# Patient Record
Sex: Male | Born: 1945
Health system: Southern US, Community
[De-identification: ages and names within clinical notes are randomized; demographics above are authoritative.]

## PROBLEM LIST (undated history)

## (undated) DIAGNOSIS — E119 Type 2 diabetes mellitus without complications: Secondary | ICD-10-CM

## (undated) DIAGNOSIS — E785 Hyperlipidemia, unspecified: Secondary | ICD-10-CM

## (undated) DIAGNOSIS — I10 Essential (primary) hypertension: Secondary | ICD-10-CM

## (undated) DIAGNOSIS — F0390 Unspecified dementia without behavioral disturbance: Secondary | ICD-10-CM

## (undated) DIAGNOSIS — K219 Gastro-esophageal reflux disease without esophagitis: Secondary | ICD-10-CM

## (undated) DIAGNOSIS — I4891 Unspecified atrial fibrillation: Secondary | ICD-10-CM

## (undated) HISTORY — DX: Type 2 diabetes mellitus without complications: E11.9

## (undated) HISTORY — DX: Hyperlipidemia, unspecified: E78.5

## (undated) HISTORY — DX: Essential (primary) hypertension: I10

## (undated) HISTORY — DX: Gastro-esophageal reflux disease without esophagitis: K21.9

## (undated) HISTORY — PX: HERNIA REPAIR: SHX51

## (undated) HISTORY — PX: COLONOSCOPY: SHX174

---

## 1991-12-17 HISTORY — PX: PATELLA FRACTURE SURGERY: SHX735

## 1998-08-16 ENCOUNTER — Encounter: Admission: RE | Admit: 1998-08-16 | Discharge: 1998-08-16 | Payer: Self-pay | Admitting: *Deleted

## 2006-04-18 ENCOUNTER — Ambulatory Visit: Payer: Self-pay | Admitting: Family Medicine

## 2006-04-25 ENCOUNTER — Ambulatory Visit: Payer: Self-pay | Admitting: Family Medicine

## 2007-05-01 ENCOUNTER — Ambulatory Visit: Payer: Self-pay | Admitting: Family Medicine

## 2007-05-01 LAB — CONVERTED CEMR LAB
AST: 17 units/L (ref 0–37)
Basophils Relative: 0.1 % (ref 0.0–1.0)
Bilirubin, Direct: 0.1 mg/dL (ref 0.0–0.3)
CO2: 28 meq/L (ref 19–32)
Eosinophils Absolute: 0.2 10*3/uL (ref 0.0–0.6)
Eosinophils Relative: 4.3 % (ref 0.0–5.0)
GFR calc Af Amer: 111 mL/min
GFR calc non Af Amer: 92 mL/min
Glucose, Bld: 105 mg/dL — ABNORMAL HIGH (ref 70–99)
HCT: 42.6 % (ref 39.0–52.0)
Hemoglobin: 14.7 g/dL (ref 13.0–17.0)
Lymphocytes Relative: 33.6 % (ref 12.0–46.0)
MCV: 81.8 fL (ref 78.0–100.0)
Neutro Abs: 1.9 10*3/uL (ref 1.4–7.7)
Neutrophils Relative %: 52.3 % (ref 43.0–77.0)
PSA: 0.56 ng/mL (ref 0.10–4.00)
Sodium: 140 meq/L (ref 135–145)
Total Protein: 6.6 g/dL (ref 6.0–8.3)
WBC: 3.8 10*3/uL — ABNORMAL LOW (ref 4.5–10.5)

## 2007-05-08 ENCOUNTER — Ambulatory Visit: Payer: Self-pay | Admitting: Family Medicine

## 2007-07-29 ENCOUNTER — Ambulatory Visit: Payer: Self-pay | Admitting: Gastroenterology

## 2007-08-12 ENCOUNTER — Encounter: Payer: Self-pay | Admitting: Family Medicine

## 2007-08-12 ENCOUNTER — Ambulatory Visit: Payer: Self-pay | Admitting: Gastroenterology

## 2008-04-08 ENCOUNTER — Ambulatory Visit: Payer: Self-pay | Admitting: Family Medicine

## 2008-04-08 LAB — CONVERTED CEMR LAB
ALT: 16 units/L (ref 0–53)
Albumin: 3.9 g/dL (ref 3.5–5.2)
BUN: 8 mg/dL (ref 6–23)
Basophils Relative: 0.1 % (ref 0.0–1.0)
CO2: 28 meq/L (ref 19–32)
Calcium: 9.3 mg/dL (ref 8.4–10.5)
Cholesterol: 143 mg/dL (ref 0–200)
Creatinine, Ser: 1 mg/dL (ref 0.4–1.5)
GFR calc Af Amer: 98 mL/min
Glucose, Bld: 119 mg/dL — ABNORMAL HIGH (ref 70–99)
HCT: 44.4 % (ref 39.0–52.0)
Hemoglobin: 14.8 g/dL (ref 13.0–17.0)
Lymphocytes Relative: 38.7 % (ref 12.0–46.0)
Monocytes Absolute: 0.3 10*3/uL (ref 0.1–1.0)
Monocytes Relative: 8.6 % (ref 3.0–12.0)
Neutro Abs: 1.8 10*3/uL (ref 1.4–7.7)
PSA: 0.76 ng/mL (ref 0.10–4.00)
RBC: 5.25 M/uL (ref 4.22–5.81)
RDW: 12.6 % (ref 11.5–14.6)
Sodium: 142 meq/L (ref 135–145)
TSH: 0.77 microintl units/mL (ref 0.35–5.50)
Total CHOL/HDL Ratio: 4.4
Total Protein: 6.5 g/dL (ref 6.0–8.3)
Triglycerides: 82 mg/dL (ref 0–149)

## 2008-04-15 ENCOUNTER — Ambulatory Visit: Payer: Self-pay | Admitting: Family Medicine

## 2008-04-15 DIAGNOSIS — E785 Hyperlipidemia, unspecified: Secondary | ICD-10-CM | POA: Insufficient documentation

## 2008-04-15 DIAGNOSIS — K219 Gastro-esophageal reflux disease without esophagitis: Secondary | ICD-10-CM | POA: Insufficient documentation

## 2008-04-15 DIAGNOSIS — H409 Unspecified glaucoma: Secondary | ICD-10-CM | POA: Insufficient documentation

## 2008-05-30 ENCOUNTER — Ambulatory Visit: Payer: Self-pay | Admitting: Family Medicine

## 2008-05-30 DIAGNOSIS — I872 Venous insufficiency (chronic) (peripheral): Secondary | ICD-10-CM | POA: Insufficient documentation

## 2009-02-06 ENCOUNTER — Ambulatory Visit: Payer: Self-pay | Admitting: Family Medicine

## 2009-02-06 DIAGNOSIS — R350 Frequency of micturition: Secondary | ICD-10-CM | POA: Insufficient documentation

## 2009-02-06 LAB — CONVERTED CEMR LAB
ALT: 30 units/L (ref 0–53)
AST: 23 units/L (ref 0–37)
Albumin: 4.5 g/dL (ref 3.5–5.2)
Alkaline Phosphatase: 118 units/L — ABNORMAL HIGH (ref 39–117)
BUN: 17 mg/dL (ref 6–23)
Basophils Absolute: 0 10*3/uL (ref 0.0–0.1)
Basophils Relative: 0 % (ref 0.0–3.0)
Blood in Urine, dipstick: NEGATIVE
CO2: 30 meq/L (ref 19–32)
Eosinophils Absolute: 0.2 10*3/uL (ref 0.0–0.7)
GFR calc Af Amer: 72 mL/min
Glucose, Bld: 520 mg/dL (ref 70–99)
Lymphocytes Relative: 30.9 % (ref 12.0–46.0)
MCHC: 34.6 g/dL (ref 30.0–36.0)
MCV: 81.6 fL (ref 78.0–100.0)
Microalb Creat Ratio: 35.5 mg/g — ABNORMAL HIGH (ref 0.0–30.0)
Neutro Abs: 4.3 10*3/uL (ref 1.4–7.7)
Neutrophils Relative %: 58.5 % (ref 43.0–77.0)
Nitrite: NEGATIVE
Potassium: 4.9 meq/L (ref 3.5–5.1)
RBC: 6.08 M/uL — ABNORMAL HIGH (ref 4.22–5.81)
RDW: 11.7 % (ref 11.5–14.6)
Sodium: 135 meq/L (ref 135–145)
Specific Gravity, Urine: 1.01
Total Protein: 7.8 g/dL (ref 6.0–8.3)
WBC Urine, dipstick: NEGATIVE
pH: 5

## 2009-02-09 ENCOUNTER — Ambulatory Visit: Payer: Self-pay | Admitting: Family Medicine

## 2009-02-14 ENCOUNTER — Telehealth: Payer: Self-pay | Admitting: Family Medicine

## 2009-02-15 ENCOUNTER — Encounter: Admission: RE | Admit: 2009-02-15 | Discharge: 2009-03-28 | Payer: Self-pay | Admitting: Family Medicine

## 2009-03-02 ENCOUNTER — Ambulatory Visit: Payer: Self-pay | Admitting: Family Medicine

## 2009-05-02 ENCOUNTER — Ambulatory Visit: Payer: Self-pay | Admitting: Family Medicine

## 2009-05-02 LAB — CONVERTED CEMR LAB
CO2: 27 meq/L (ref 19–32)
Chloride: 111 meq/L (ref 96–112)
Glucose, Bld: 88 mg/dL (ref 70–99)
Potassium: 3.8 meq/L (ref 3.5–5.1)
Sodium: 142 meq/L (ref 135–145)

## 2009-05-09 ENCOUNTER — Ambulatory Visit: Payer: Self-pay | Admitting: Family Medicine

## 2009-05-26 ENCOUNTER — Telehealth: Payer: Self-pay | Admitting: *Deleted

## 2009-07-28 ENCOUNTER — Ambulatory Visit: Payer: Self-pay | Admitting: Family Medicine

## 2009-07-28 LAB — CONVERTED CEMR LAB
BUN: 17 mg/dL (ref 6–23)
CO2: 27 meq/L (ref 19–32)
Chloride: 108 meq/L (ref 96–112)
Glucose, Bld: 94 mg/dL (ref 70–99)
Potassium: 4.6 meq/L (ref 3.5–5.1)
Sodium: 141 meq/L (ref 135–145)

## 2009-08-11 ENCOUNTER — Ambulatory Visit: Payer: Self-pay | Admitting: Family Medicine

## 2009-09-06 ENCOUNTER — Ambulatory Visit: Payer: Self-pay | Admitting: Family Medicine

## 2009-09-06 LAB — CONVERTED CEMR LAB
ALT: 20 units/L (ref 0–53)
Albumin: 4 g/dL (ref 3.5–5.2)
Basophils Absolute: 0 10*3/uL (ref 0.0–0.1)
Eosinophils Relative: 6.6 % — ABNORMAL HIGH (ref 0.0–5.0)
HCT: 39.4 % (ref 39.0–52.0)
HDL: 35.8 mg/dL — ABNORMAL LOW (ref >39.00)
Hemoglobin: 13.5 g/dL (ref 13.0–17.0)
Ketones, ur: NEGATIVE mg/dL
Leukocytes, UA: NEGATIVE
Lymphocytes Relative: 35.8 % (ref 12.0–46.0)
Lymphs Abs: 1.5 10*3/uL (ref 0.7–4.0)
Monocytes Relative: 10.6 % (ref 3.0–12.0)
Nitrite: NEGATIVE
PSA: 0.51 ng/mL (ref 0.10–4.00)
Platelets: 222 10*3/uL (ref 150.0–400.0)
RDW: 12.6 % (ref 11.5–14.6)
Specific Gravity, Urine: 1.01 (ref 1.000–1.030)
TSH: 0.69 microintl units/mL (ref 0.35–5.50)
Total Bilirubin: 1.2 mg/dL (ref 0.3–1.2)
Total CHOL/HDL Ratio: 4
Triglycerides: 60 mg/dL (ref 0.0–149.0)
Urobilinogen, UA: 0.2 (ref 0.0–1.0)
VLDL: 12 mg/dL (ref 0.0–40.0)
WBC: 4.1 10*3/uL — ABNORMAL LOW (ref 4.5–10.5)
pH: 6 (ref 5.0–8.0)

## 2009-09-13 ENCOUNTER — Ambulatory Visit: Payer: Self-pay | Admitting: Family Medicine

## 2010-01-02 ENCOUNTER — Telehealth: Payer: Self-pay | Admitting: Family Medicine

## 2010-01-04 ENCOUNTER — Telehealth: Payer: Self-pay | Admitting: Family Medicine

## 2010-03-02 ENCOUNTER — Ambulatory Visit: Payer: Self-pay | Admitting: Family Medicine

## 2010-03-02 LAB — CONVERTED CEMR LAB
Calcium: 9.4 mg/dL (ref 8.4–10.5)
Creatinine, Ser: 1.1 mg/dL (ref 0.4–1.5)
GFR calc non Af Amer: 87.04 mL/min (ref >60)
Hgb A1c MFr Bld: 6.1 % (ref 4.6–6.5)
Sodium: 136 meq/L (ref 135–145)

## 2010-03-16 ENCOUNTER — Ambulatory Visit: Payer: Self-pay | Admitting: Family Medicine

## 2010-03-20 ENCOUNTER — Telehealth: Payer: Self-pay | Admitting: Family Medicine

## 2010-04-02 ENCOUNTER — Encounter: Payer: Self-pay | Admitting: Family Medicine

## 2011-01-01 ENCOUNTER — Ambulatory Visit
Admission: RE | Admit: 2011-01-01 | Discharge: 2011-01-01 | Payer: Self-pay | Source: Home / Self Care | Attending: Family Medicine | Admitting: Family Medicine

## 2011-01-15 NOTE — Miscellaneous (Signed)
Summary: eye exam   Clinical Lists Changes  Observations: Added new observation of EYES COMMENT: 03/2011 (04/02/2010 14:57) Added new observation of EYE EXAM BY: dr Hyacinth Meeker (03/29/2010 14:58) Added new observation of DMEYEEXMRES: normal (03/29/2010 14:58) Added new observation of DIAB EYE EX: normal (03/29/2010 14:58)        Diabetes Management History:      He says that he is exercising.    Diabetes Management Exam:    Eye Exam:       Eye Exam done elsewhere          Date: 03/29/2010          Results: normal          Done by: dr Hyacinth Meeker

## 2011-01-15 NOTE — Assessment & Plan Note (Signed)
Summary: 6 month roa//lh/PT RESCD FROM BUMP//CCM   Vital Signs:  Patient profile:   65 year old male Height:      74.75 inches Weight:      194 pounds BMI:     24.50 Temp:     98.3 degrees F oral BP sitting:   102 / 70  (left arm) Cuff size:   regular  Vitals Entered By: Kern Reap CMA Duncan Dull) (March 16, 2010 9:09 AM)  CC: follow-up visit Is Patient Diabetic? Yes Did you bring your meter with you today? No   CC:  follow-up visit.  History of Present Illness: Christopher [name] is a 65 year old male, married, nonsmoker, who comes in today for evaluation of diabetes.  His fasting blood sugars 118 hemoglobin A1c6 .1%.  No hypoglycemia.  Allergies: No Known Drug Allergies  Past History:  Past medical, surgical, family and social histories (including risk factors) reviewed for relevance to current acute and chronic problems.  Past Medical History: Reviewed history from 02/06/2009 and no changes required. GERD Hyperlipidemia glaucoma Diabetes mellitus, type II  Family History: Reviewed history from 04/15/2008 and no changes required. father died at 31 from alcoholism  mother died 2 from TBone brother died of a drug induced renal disease and underlying diabetes  one sister diabetic  Social History: Reviewed history from 04/15/2008 and no changes required. Occupation: Married Never Smoked Alcohol use-no Drug use-no Regular exercise-yes  Review of Systems      See HPI  Physical Exam  General:  Well-developed,well-nourished,in no acute distress; alert,appropriate and cooperative throughout examination   Impression & Recommendations:  Problem # 1:  DIABETES MELLITUS, TYPE II (ICD-250.00) Assessment Improved  His updated medication list for this problem includes:    Adult Aspirin Ec Low Strength 81 Mg Tbec (Aspirin)    Glucophage 500 Mg Tabs (Metformin hcl) .Marland Kitchen... Take 1 tablet by mouth two times a day    Zestril 2.5 Mg Tabs (Lisinopril) .Marland Kitchen... Take 1 tablet by  mouth every morning  Complete Medication List: 1)  Zocor 40 Mg Tabs (Simvastatin) .Marland Kitchen.. 1 & 1/2 at bedtime 2)  Cosopt 2-0.5 % Soln (Dorzolamide-timolol) 3)  Adult Aspirin Ec Low Strength 81 Mg Tbec (Aspirin) 4)  Hydrochlorothiazide 25 Mg Tabs (Hydrochlorothiazide) .... Take 1 tablet by mouth every morning 5)  Glucophage 500 Mg Tabs (Metformin hcl) .... Take 1 tablet by mouth two times a day 6)  Accu-chek Aviva Strp (Glucose blood) .... Test daily as directed by your doctor 7)  Accu-chek Soft Touch Lancets Misc (Lancets) .... Test daily as directed by your doctor 8)  Centrum Silver Ultra Mens Tabs (Multiple vitamins-minerals) .... Once daily 9)  Fish Oil 300 Mg Caps (Omega-3 fatty acids) .... Once daily 10)  Flax Oil (Flaxseed (linseed)) .... Once daily 11)  Potassium 99 Mg Tabs (Potassium) .... Once daily 12)  Zestril 2.5 Mg Tabs (Lisinopril) .... Take 1 tablet by mouth every morning 13)  Metanx 3-35-2 Mg Tabs (L-methylfolate-b6-b12) .... Take one tab two times a day  Patient Instructions: 1)  See your eye doctor yearly to check for diabetic eye damage. 2)  check a fasting blood sugar once weekly in the morning.  Continue current medications.  Return for your annual physical exam the fourth week in September.......last friday 3)  BMP prior to visit, ICD-9:..........................250.00. 4)  Hepatic Panel prior to visit, ICD-9: 5)  Lipid Panel prior to visit, ICD-9: 6)  TSH prior to visit, ICD-9: 7)  CBC w/ Diff prior to visit, ICD-9: 8)  Urine-dip prior to visit, ICD-9: 9)  PSA prior to visit, ICD-9: 10)  HbgA1C prior to visit, ICD-9: 11)  Urine Microalbumin prior to visit, ICD-9:

## 2011-01-15 NOTE — Progress Notes (Signed)
Summary: refills  Phone Note Refill Request Message from:  Fax from Pharmacy  Refills Requested: Medication #1:  ACCU-CHEK SOFT TOUCH LANCETS  MISC test daily as directed by your doctor   Brand Name Necessary? No  Medication #2:  ACCU-CHEK AVIVA  STRP test daily as directed by your doctor [facility] pharmacy fax---[phone]  Initial call taken by: Warnell Forester,  January 02, 2010 4:37 PM    Prescriptions: ACCU-CHEK SOFT TOUCH LANCETS  MISC (LANCETS) test daily as directed by your doctor  #100 x 11   Entered by:   Kern Reap CMA (AAMA)   Authorized by:   Roderick Pee MD   Signed by:   Kern Reap CMA (AAMA) on 01/02/2010   Method used:   Electronically to        [facility]* (mail-order)             ,          Ph: [phone]       Fax: [phone]   RxID:   [id number] ACCU-CHEK AVIVA  STRP (GLUCOSE BLOOD) test daily as directed by your doctor  #100 x 11   Entered by:   Kern Reap CMA (AAMA)   Authorized by:   Roderick Pee MD   Signed by:   Kern Reap CMA (AAMA) on 01/02/2010   Method used:   Electronically to        [facility]* (mail-order)             ,          Ph: [phone]       Fax: [phone]   RxID:   [id number]

## 2011-01-15 NOTE — Progress Notes (Signed)
Summary: sinus congestion  Phone Note Call from Patient   Caller: Patient Call For: Roderick Pee MD Summary of Call: Pt is complaining of sinus congestion, and pressure around eyes and nostrils are clogged on left side.  Wants to know what he can take? No fever. [facility] [address] [phone] Initial call taken by: Lynann Beaver CMA,  March 20, 2010 1:10 PM  Follow-up for Phone Call        Claritin 10 mg plain q.a.m., or Zantac plain nightly.  Also, saline nasal irrigation at bedtime with warm salt water and a netti pot Follow-up by: Roderick Pee MD,  March 20, 2010 1:34 PM  Additional Follow-up for Phone Call Additional follow up Details #1::        Zantac or Zyrtec, Dr. Tawanna Cooler?? Additional Follow-up by: Lynann Beaver CMA,  March 20, 2010 1:44 PM    Additional Follow-up for Phone Call Additional follow up Details #2::    zyrtec  LMTCB Debby [facility] CMA  March 20, 2010 2:10 PM  Follow-up by: Roderick Pee MD,  March 20, 2010 2:00 PM  Additional Follow-up for Phone Call Additional follow up Details #3:: Details for Additional Follow-up Action Taken: Patient called & is aware. Additional Follow-up by: Rudy Jew, RN,  March 20, 2010 2:33 PM  New/Updated Medications: CLARITIN 10 MG TABS (LORATADINE) Use in am ZYRTEC ALLERGY 10 MG TABS (CETIRIZINE HCL) Use in pm

## 2011-01-15 NOTE — Progress Notes (Signed)
Summary: refills  Phone Note Refill Request Message from:  Fax from Pharmacy  Refills Requested: Medication #1:  GLUCOPHAGE 500 MG TABS Take 1 tablet by mouth two times a day   Brand Name Necessary? No  Medication #2:  ZOCOR 40 MG  TABS 1 & 1/2 at bedtime   Brand Name Necessary? No  Medication #3:  ZESTRIL 2.5 MG TABS Take 1 tablet by mouth every morning   Brand Name Necessary? No  Medication #4:  HYDROCHLOROTHIAZIDE 25 MG  TABS Take 1 tablet by mouth every morning [facility] pharmacy fax---[phone]  Initial call taken by: Warnell Forester,  January 02, 2010 4:36 PM    Prescriptions: ZESTRIL 2.5 MG TABS (LISINOPRIL) Take 1 tablet by mouth every morning  #100 x 3   Entered by:   Kern Reap CMA (AAMA)   Authorized by:   Roderick Pee MD   Signed by:   Kern Reap CMA (AAMA) on 01/02/2010   Method used:   Electronically to        [facility]* (mail-order)             ,          Ph: [phone]       Fax: [phone]   RxID:   [id number] GLUCOPHAGE 500 MG TABS (METFORMIN HCL) Take 1 tablet by mouth two times a day  #200 x 3   Entered by:   Kern Reap CMA (AAMA)   Authorized by:   Roderick Pee MD   Signed by:   Kern Reap CMA (AAMA) on 01/02/2010   Method used:   Electronically to        [facility]* (mail-order)             ,          Ph: [phone]       Fax: [phone]   RxID:   [id number] HYDROCHLOROTHIAZIDE 25 MG  TABS (HYDROCHLOROTHIAZIDE) Take 1 tablet by mouth every morning  #100 x 3   Entered by:   Kern Reap CMA (AAMA)   Authorized by:   Roderick Pee MD   Signed by:   Kern Reap CMA (AAMA) on 01/02/2010   Method used:   Electronically to        [facility]* (mail-order)             ,          Ph: [phone]       Fax: [phone]   RxID:   [id number] ZOCOR 40 MG  TABS (SIMVASTATIN) 1 & 1/2 at bedtime  #150 x 3   Entered by:   Kern Reap CMA (AAMA)   Authorized by:   Roderick Pee MD   Signed  by:   Kern Reap CMA (AAMA) on 01/02/2010   Method used:   Electronically to        [facility]* (mail-order)             ,          Ph: [phone]       Fax: [phone]   RxID:   [id number]

## 2011-01-15 NOTE — Progress Notes (Signed)
Summary: refils  Phone Note Refill Request Message from:  Fax from Pharmacy on January 04, 2010 3:18 PM  Refills Requested: Medication #1:  GLUCOPHAGE 500 MG TABS Take 1 tablet by mouth two times a day  Medication #2:  ZOCOR 40 MG  TABS 1 & 1/2 at bedtime  Medication #3:  ZESTRIL 2.5 MG TABS Take 1 tablet by mouth every morning  Medication #4:  HYDROCHLOROTHIAZIDE 25 MG  TABS Take 1 tablet by mouth every morning Initial call taken by: Kern Reap CMA Duncan Dull),  January 04, 2010 3:18 PM    Prescriptions: ACCU-CHEK SOFT TOUCH LANCETS  MISC (LANCETS) test daily as directed by your doctor  #100 x 11   Entered by:   Kern Reap CMA (AAMA)   Authorized by:   Roderick Pee MD   Signed by:   Kern Reap CMA (AAMA) on 01/04/2010   Method used:   Electronically to        [facility]* (mail-order)             ,          Ph: [phone]       Fax: [phone]   RxID:   [id number] ACCU-CHEK AVIVA  STRP (GLUCOSE BLOOD) test daily as directed by your doctor  #100 x 11   Entered by:   Kern Reap CMA (AAMA)   Authorized by:   Roderick Pee MD   Signed by:   Kern Reap CMA (AAMA) on 01/04/2010   Method used:   Electronically to        [facility]* (mail-order)             ,          Ph: [phone]       Fax: [phone]   RxID:   [id number] HYDROCHLOROTHIAZIDE 25 MG  TABS (HYDROCHLOROTHIAZIDE) Take 1 tablet by mouth every morning  #100 x 3   Entered by:   Kern Reap CMA (AAMA)   Authorized by:   Roderick Pee MD   Signed by:   Kern Reap CMA (AAMA) on 01/04/2010   Method used:   Electronically to        [facility]* (mail-order)             ,          Ph: [phone]       Fax: [phone]   RxID:   [id number] ZESTRIL 2.5 MG TABS (LISINOPRIL) Take 1 tablet by mouth every morning  #100 x 3   Entered by:   Kern Reap CMA (AAMA)   Authorized by:   Roderick Pee MD   Signed by:   Kern Reap CMA (AAMA) on 01/04/2010   Method used:    Electronically to        [facility]* (mail-order)             ,          Ph: [phone]       Fax: [phone]   RxID:   [id number] ZOCOR 40 MG  TABS (SIMVASTATIN) 1 & 1/2 at bedtime  #150 x 3   Entered by:   Kern Reap CMA (AAMA)   Authorized by:   Roderick Pee MD   Signed by:   Kern Reap CMA (AAMA) on 01/04/2010   Method used:   Electronically to        [facility]* (mail-order)             ,  Ph: [phone]       Fax: [phone]   RxID:   [id number] GLUCOPHAGE 500 MG TABS (METFORMIN HCL) Take 1 tablet by mouth two times a day  #200 x 3   Entered by:   Kern Reap CMA (AAMA)   Authorized by:   Roderick Pee MD   Signed by:   Kern Reap CMA (AAMA) on 01/04/2010   Method used:   Electronically to        [facility]* (mail-order)             ,          Ph: [phone]       Fax: [phone]   RxID:   [id number]

## 2011-01-17 NOTE — Assessment & Plan Note (Signed)
Summary: flun shot/njr   Nurse Visit   Allergies: No Known Drug Allergies  Orders Added: 1)  Admin 1st Vaccine [90471] 2)  Flu Vaccine 33yrs + [16109] Flu Vaccine Consent Questions     Do you have a history of severe allergic reactions to this vaccine? no    Any prior history of allergic reactions to egg and/or gelatin? no    Do you have a sensitivity to the preservative Thimersol? no    Do you have a past history of Guillan-Barre Syndrome? no    Do you currently have an acute febrile illness? no    Have you ever had a severe reaction to latex? no    Vaccine information given and explained to patient? yes    Are you currently pregnant? no    Lot Number:[id number]   Exp Date:06/15/2011   Site Given  Left Deltoid IM   .lbflu

## 2011-01-24 ENCOUNTER — Other Ambulatory Visit: Payer: Self-pay | Admitting: Family Medicine

## 2011-02-15 ENCOUNTER — Other Ambulatory Visit: Payer: 59 | Admitting: Family Medicine

## 2011-02-15 DIAGNOSIS — Z0000 Encounter for general adult medical examination without abnormal findings: Secondary | ICD-10-CM

## 2011-02-15 LAB — LIPID PANEL
Cholesterol: 150 mg/dL (ref 0–200)
HDL: 44.9 mg/dL (ref >39.00)
LDL Cholesterol: 96 mg/dL (ref 0–99)
Total CHOL/HDL Ratio: 3
Triglycerides: 47 mg/dL (ref 0.0–149.0)
VLDL: 9.4 mg/dL (ref 0.0–40.0)

## 2011-02-15 LAB — CBC WITH DIFFERENTIAL/PLATELET
Basophils Absolute: 0 10*3/uL (ref 0.0–0.1)
Eosinophils Absolute: 0.2 10*3/uL (ref 0.0–0.7)
HCT: 40.6 % (ref 39.0–52.0)
Lymphs Abs: 1.4 10*3/uL (ref 0.7–4.0)
MCHC: 34.5 g/dL (ref 30.0–36.0)
Monocytes Relative: 9.7 % (ref 3.0–12.0)
Neutro Abs: 1.7 10*3/uL (ref 1.4–7.7)
Platelets: 222 10*3/uL (ref 150.0–400.0)
RDW: 13.7 % (ref 11.5–14.6)

## 2011-02-15 LAB — POCT URINALYSIS DIPSTICK
Bilirubin, UA: NEGATIVE
Glucose, UA: NEGATIVE
Ketones, UA: NEGATIVE
Leukocytes, UA: NEGATIVE
Nitrite, UA: NEGATIVE

## 2011-02-15 LAB — HEPATIC FUNCTION PANEL
AST: 23 U/L (ref 0–37)
Albumin: 4.2 g/dL (ref 3.5–5.2)
Total Bilirubin: 1.3 mg/dL — ABNORMAL HIGH (ref 0.3–1.2)

## 2011-02-15 LAB — BASIC METABOLIC PANEL
BUN: 17 mg/dL (ref 6–23)
CO2: 27 mEq/L (ref 19–32)
Calcium: 9.8 mg/dL (ref 8.4–10.5)
GFR: 87.69 mL/min (ref >60.00)
Glucose, Bld: 115 mg/dL — ABNORMAL HIGH (ref 70–99)
Potassium: 3.9 mEq/L (ref 3.5–5.1)

## 2011-02-15 LAB — MICROALBUMIN / CREATININE URINE RATIO
Creatinine,U: 78.7 mg/dL
Microalb Creat Ratio: 2.4 mg/g (ref 0.0–30.0)
Microalb, Ur: 1.9 mg/dL (ref 0.0–1.9)

## 2011-02-15 LAB — HEMOGLOBIN A1C: Hgb A1c MFr Bld: 6.6 % — ABNORMAL HIGH (ref 4.6–6.5)

## 2011-02-15 LAB — PSA: PSA: 0.83 ng/mL (ref 0.10–4.00)

## 2011-03-01 ENCOUNTER — Encounter: Payer: Self-pay | Admitting: Family Medicine

## 2011-03-02 ENCOUNTER — Other Ambulatory Visit: Payer: Self-pay | Admitting: Family Medicine

## 2011-03-25 ENCOUNTER — Encounter: Payer: Self-pay | Admitting: Family Medicine

## 2011-03-26 ENCOUNTER — Ambulatory Visit (INDEPENDENT_AMBULATORY_CARE_PROVIDER_SITE_OTHER): Payer: Managed Care, Other (non HMO) | Admitting: Family Medicine

## 2011-03-26 ENCOUNTER — Encounter: Payer: Self-pay | Admitting: Family Medicine

## 2011-03-26 VITALS — BP 110/74 | Temp 98.5°F | Ht 75.5 in | Wt 201.0 lb

## 2011-03-26 DIAGNOSIS — E785 Hyperlipidemia, unspecified: Secondary | ICD-10-CM

## 2011-03-26 DIAGNOSIS — E119 Type 2 diabetes mellitus without complications: Secondary | ICD-10-CM

## 2011-03-26 DIAGNOSIS — I872 Venous insufficiency (chronic) (peripheral): Secondary | ICD-10-CM

## 2011-03-26 DIAGNOSIS — I10 Essential (primary) hypertension: Secondary | ICD-10-CM

## 2011-03-26 MED ORDER — SIMVASTATIN 40 MG PO TABS: ORAL_TABLET | ORAL | Status: DC

## 2011-03-26 MED ORDER — HYDROCHLOROTHIAZIDE 25 MG PO TABS: 25.0000 mg | ORAL_TABLET | Freq: Every day | ORAL | Status: DC

## 2011-03-26 MED ORDER — LISINOPRIL 2.5 MG PO TABS: 2.5000 mg | ORAL_TABLET | Freq: Every day | ORAL | Status: DC

## 2011-03-26 MED ORDER — METFORMIN HCL 500 MG PO TABS: 500.0000 mg | ORAL_TABLET | Freq: Two times a day (BID) | ORAL | Status: DC

## 2011-03-26 NOTE — Progress Notes (Signed)
  Subjective:    Patient ID: Christopher [name], male    DOB: 12/10/1946, 65 y.o.   MRN: [id number]  HPIPhillip is a 65 year old, married male, nonsmoker, who comes in today for follow up of diabetes, and hyperlipidemia, and venous insufficiency.  His medications were reviewed in detail.  No changes.  He states he feels well.  No complaints.  He gets routine eye care, dental care, colonoscopy, 2006, normal, tetanus, 2008, Pneumovax 2010, information given on shingles.    Review of Systems  Constitutional: Negative.   HENT: Negative.   Eyes: Negative.   Respiratory: Negative.   Cardiovascular: Negative.   Gastrointestinal: Negative.   Genitourinary: Negative.   Musculoskeletal: Negative.   Skin: Negative.   Neurological: Negative.   Hematological: Negative.   Psychiatric/Behavioral: Negative.        Objective:   Physical Exam  Constitutional: He is oriented to person, place, and time. He appears well-developed and well-nourished.  HENT:  Head: Normocephalic and atraumatic.  Right Ear: External ear normal.  Left Ear: External ear normal.  Nose: Nose normal.  Mouth/Throat: Oropharynx is clear and moist.  Eyes: Conjunctivae and EOM are normal. Pupils are equal, round, and reactive to light.  Neck: Normal range of motion. Neck supple. No JVD present. No tracheal deviation present. No thyromegaly present.  Cardiovascular: Normal rate, regular rhythm, normal heart sounds and intact distal pulses.  Exam reveals no gallop and no friction rub.   No murmur heard. Pulmonary/Chest: Effort normal and breath sounds normal. No stridor. No respiratory distress. He has no wheezes. He has no rales. He exhibits no tenderness.  Abdominal: Soft. Bowel sounds are normal. He exhibits no distension and no mass. There is no tenderness. There is no rebound and no guarding.  Genitourinary: Rectum normal, prostate normal and penis normal. Guaiac negative stool. No penile tenderness.  Musculoskeletal:  Normal range of motion. He exhibits no edema and no tenderness.  Lymphadenopathy:    He has no cervical adenopathy.  Neurological: He is alert and oriented to person, place, and time. He has normal reflexes. No cranial nerve deficit. He exhibits normal muscle tone.  Skin: Skin is warm and dry. No rash noted. No erythema. No pallor.  Psychiatric: He has a normal mood and affect. His behavior is normal. Judgment and thought content normal.          Assessment & Plan:  Diabetes type 2, under good control continue current medications.Follow-up in 6 months  Hyperlipidemia.  Continue Zocor 40 nightly  Venous insufficiency.  Continue hydrochlorothiazide 25 daily.  Consider shingles. Vaccination

## 2011-03-26 NOTE — Patient Instructions (Signed)
Continue current medications follow-up in one year or sooner if any problems 

## 2011-05-16 ENCOUNTER — Other Ambulatory Visit: Payer: Self-pay | Admitting: *Deleted

## 2011-06-22 ENCOUNTER — Other Ambulatory Visit: Payer: Self-pay | Admitting: Family Medicine

## 2011-09-17 ENCOUNTER — Other Ambulatory Visit (INDEPENDENT_AMBULATORY_CARE_PROVIDER_SITE_OTHER): Payer: Managed Care, Other (non HMO)

## 2011-09-17 DIAGNOSIS — E119 Type 2 diabetes mellitus without complications: Secondary | ICD-10-CM

## 2011-09-17 LAB — BASIC METABOLIC PANEL
BUN: 18 mg/dL (ref 6–23)
GFR: 83.96 mL/min (ref >60.00)
Potassium: 3.4 mEq/L — ABNORMAL LOW (ref 3.5–5.1)
Sodium: 138 mEq/L (ref 135–145)

## 2011-09-24 ENCOUNTER — Ambulatory Visit (INDEPENDENT_AMBULATORY_CARE_PROVIDER_SITE_OTHER): Payer: Managed Care, Other (non HMO) | Admitting: Family Medicine

## 2011-09-24 ENCOUNTER — Encounter: Payer: Self-pay | Admitting: Family Medicine

## 2011-09-24 VITALS — BP 110/82 | Temp 98.2°F | Wt 199.0 lb

## 2011-09-24 DIAGNOSIS — E119 Type 2 diabetes mellitus without complications: Secondary | ICD-10-CM

## 2011-09-24 NOTE — Progress Notes (Signed)
  Subjective:    Patient ID: Christopher [name], male    DOB: 1946/09/12, 65 y.o.   MRN: [id number]  HPI  Christopher [name] is a 65 year old male, nonsmoker, married, and comes in today for follow-up of diabetes.  He takes metformin 500 mg b.i.d. Blood sugars up to 148 however hemoglobin A1c is 6.8%.  He's been off his diet little bit, but he exercises on a daily basis.  Weight 199, height 6-foot 4  Review of Systems General metabolic review of systems otherwise negative    Objective:   Physical Exam Well-developed well-nourished, male in no acute distress       Assessment & Plan:  Diabetes type II adult continue current therapy.  Follow-up March 2013 annual exam

## 2011-09-24 NOTE — Patient Instructions (Signed)
Continue your current medications.  Follow-up in March for your annual exam.  Labs one week prior

## 2012-03-19 ENCOUNTER — Other Ambulatory Visit (INDEPENDENT_AMBULATORY_CARE_PROVIDER_SITE_OTHER): Payer: Managed Care, Other (non HMO)

## 2012-03-19 DIAGNOSIS — E119 Type 2 diabetes mellitus without complications: Secondary | ICD-10-CM

## 2012-03-19 LAB — MICROALBUMIN / CREATININE URINE RATIO
Creatinine,U: 83.2 mg/dL
Microalb Creat Ratio: 0.1 mg/g (ref 0.0–30.0)
Microalb, Ur: 0.1 mg/dL (ref 0.0–1.9)

## 2012-03-26 ENCOUNTER — Encounter: Payer: Self-pay | Admitting: Family Medicine

## 2012-03-26 ENCOUNTER — Ambulatory Visit (INDEPENDENT_AMBULATORY_CARE_PROVIDER_SITE_OTHER): Payer: Managed Care, Other (non HMO) | Admitting: Family Medicine

## 2012-03-26 VITALS — BP 90/60 | Temp 98.5°F | Ht 75.5 in | Wt 193.0 lb

## 2012-03-26 DIAGNOSIS — E785 Hyperlipidemia, unspecified: Secondary | ICD-10-CM

## 2012-03-26 DIAGNOSIS — K219 Gastro-esophageal reflux disease without esophagitis: Secondary | ICD-10-CM

## 2012-03-26 DIAGNOSIS — E119 Type 2 diabetes mellitus without complications: Secondary | ICD-10-CM

## 2012-03-26 DIAGNOSIS — I10 Essential (primary) hypertension: Secondary | ICD-10-CM

## 2012-03-26 DIAGNOSIS — I872 Venous insufficiency (chronic) (peripheral): Secondary | ICD-10-CM

## 2012-03-26 LAB — HEPATIC FUNCTION PANEL
ALT: 18 U/L (ref 0–53)
AST: 24 U/L (ref 0–37)
Alkaline Phosphatase: 53 U/L (ref 39–117)

## 2012-03-26 LAB — CBC WITH DIFFERENTIAL/PLATELET
Basophils Relative: 1 % (ref 0.0–3.0)
Eosinophils Absolute: 0.3 10*3/uL (ref 0.0–0.7)
Hemoglobin: 14 g/dL (ref 13.0–17.0)
Lymphocytes Relative: 40.2 % (ref 12.0–46.0)
MCHC: 33.7 g/dL (ref 30.0–36.0)
MCV: 82.6 fl (ref 78.0–100.0)
Neutro Abs: 1.7 10*3/uL (ref 1.4–7.7)
RBC: 5.04 Mil/uL (ref 4.22–5.81)

## 2012-03-26 LAB — LIPID PANEL
Cholesterol: 159 mg/dL (ref 0–200)
LDL Cholesterol: 105 mg/dL — ABNORMAL HIGH (ref 0–99)
Total CHOL/HDL Ratio: 4
Triglycerides: 74 mg/dL (ref 0.0–149.0)
VLDL: 14.8 mg/dL (ref 0.0–40.0)

## 2012-03-26 LAB — BASIC METABOLIC PANEL
CO2: 27 mEq/L (ref 19–32)
Calcium: 9.6 mg/dL (ref 8.4–10.5)
Chloride: 100 mEq/L (ref 96–112)
Potassium: 3.7 mEq/L (ref 3.5–5.1)
Sodium: 136 mEq/L (ref 135–145)

## 2012-03-26 MED ORDER — HYDROCHLOROTHIAZIDE 25 MG PO TABS: 25.0000 mg | ORAL_TABLET | Freq: Every day | ORAL | Status: DC

## 2012-03-26 MED ORDER — LISINOPRIL 2.5 MG PO TABS: 2.5000 mg | ORAL_TABLET | Freq: Every day | ORAL | Status: DC

## 2012-03-26 MED ORDER — SIMVASTATIN 40 MG PO TABS: ORAL_TABLET | ORAL | Status: DC

## 2012-03-26 MED ORDER — METFORMIN HCL 500 MG PO TABS: 500.0000 mg | ORAL_TABLET | Freq: Two times a day (BID) | ORAL | Status: DC

## 2012-03-26 MED ORDER — GLUCOSE BLOOD VI STRP: ORAL_STRIP | Status: DC

## 2012-03-26 MED ORDER — ACCU-CHEK MULTICLIX LANCETS MISC: Status: DC

## 2012-03-26 NOTE — Progress Notes (Signed)
  Subjective:    Patient ID: Christopher [name], male    DOB: 09-16-1946, 66 y.o.   MRN: [id number]  HPI Christopher [name] is a 66 year old male nonsmoker who comes in today for annual evaluation of diabetes hyperlipidemia venous insufficiency of allergic rhinitis. He also has glaucoma and has been treated by his eye doctor  His medications reviewed they're stable blood sugar at home has been normal. Labs prior to physical examination show no microalbumin  He gets routine eye care, dental care, vaccinations up-to-date except no shingles vaccine encourage that  Colonoscopy and GI   Review of Systems  Constitutional: Negative.   HENT: Negative.   Eyes: Negative.   Respiratory: Negative.   Cardiovascular: Negative.   Gastrointestinal: Negative.   Genitourinary: Negative.   Musculoskeletal: Negative.   Skin: Negative.   Neurological: Negative.   Hematological: Negative.   Psychiatric/Behavioral: Negative.        Objective:   Physical Exam  Constitutional: He is oriented to person, place, and time. He appears well-developed and well-nourished.  HENT:  Head: Normocephalic and atraumatic.  Right Ear: External ear normal.  Left Ear: External ear normal.  Nose: Nose normal.  Mouth/Throat: Oropharynx is clear and moist.  Eyes: Conjunctivae and EOM are normal. Pupils are equal, round, and reactive to light.  Neck: Normal range of motion. Neck supple. No JVD present. No tracheal deviation present. No thyromegaly present.  Cardiovascular: Normal rate, regular rhythm, normal heart sounds and intact distal pulses.  Exam reveals no gallop and no friction rub.   No murmur heard. Pulmonary/Chest: Effort normal and breath sounds normal. No stridor. No respiratory distress. He has no wheezes. He has no rales. He exhibits no tenderness.  Abdominal: Soft. Bowel sounds are normal. He exhibits no distension and no mass. There is no tenderness. There is no rebound and no guarding.  Genitourinary: Rectum normal,  prostate normal and penis normal. Guaiac negative stool. No penile tenderness.  Musculoskeletal: Normal range of motion. He exhibits no edema and no tenderness.  Lymphadenopathy:    He has no cervical adenopathy.  Neurological: He is alert and oriented to person, place, and time. He has normal reflexes. No cranial nerve deficit. He exhibits normal muscle tone.  Skin: Skin is warm and dry. No rash noted. No erythema. No pallor.  Psychiatric: He has a normal mood and affect. His behavior is normal. Judgment and thought content normal.          Assessment & Plan:  Healthy male  Diabetes type 2 check labs  Hyperlipidemia check labs continue Zocor 60 mg each bedtime along with an aspirin tablet  Venous insufficiency continue hydrochlorothiazide one daily  Glaucoma continue followup by ophthalmology.

## 2012-03-26 NOTE — Patient Instructions (Signed)
Continue your current medications  We will call you to discuss followup and any changes in medicines that we need to make

## 2012-03-27 LAB — HEMOGLOBIN A1C: Hgb A1c MFr Bld: 6.9 % — ABNORMAL HIGH (ref 4.6–6.5)

## 2013-03-04 ENCOUNTER — Other Ambulatory Visit: Payer: Managed Care, Other (non HMO)

## 2013-03-11 ENCOUNTER — Encounter: Payer: Managed Care, Other (non HMO) | Admitting: Family Medicine

## 2013-04-12 ENCOUNTER — Telehealth: Payer: Self-pay | Admitting: Family Medicine

## 2013-04-12 DIAGNOSIS — I872 Venous insufficiency (chronic) (peripheral): Secondary | ICD-10-CM

## 2013-04-12 DIAGNOSIS — E119 Type 2 diabetes mellitus without complications: Secondary | ICD-10-CM

## 2013-04-12 DIAGNOSIS — I10 Essential (primary) hypertension: Secondary | ICD-10-CM

## 2013-04-12 DIAGNOSIS — E785 Hyperlipidemia, unspecified: Secondary | ICD-10-CM

## 2013-04-12 MED ORDER — LISINOPRIL 2.5 MG PO TABS: 2.5000 mg | ORAL_TABLET | Freq: Every day | ORAL | Status: DC

## 2013-04-12 MED ORDER — HYDROCHLOROTHIAZIDE 25 MG PO TABS: 25.0000 mg | ORAL_TABLET | Freq: Every day | ORAL | Status: DC

## 2013-04-12 MED ORDER — METFORMIN HCL 500 MG PO TABS: 500.0000 mg | ORAL_TABLET | Freq: Two times a day (BID) | ORAL | Status: DC

## 2013-04-12 MED ORDER — SIMVASTATIN 40 MG PO TABS: ORAL_TABLET | ORAL | Status: DC

## 2013-04-12 NOTE — Telephone Encounter (Signed)
Patient called stating that he need refills of his simvastatin 40 mg 1 1/2 po qd at night, metformin 500 mg 1 po bid with meal, lisinopril 2.5 mg 1poqd, hydroclorothiazide 25 mg 1poqd sent to [facility] on [address]. Please assist.

## 2013-05-27 ENCOUNTER — Other Ambulatory Visit: Payer: Managed Care, Other (non HMO)

## 2013-06-01 ENCOUNTER — Encounter: Payer: Managed Care, Other (non HMO) | Admitting: Family Medicine

## 2013-06-17 ENCOUNTER — Other Ambulatory Visit (INDEPENDENT_AMBULATORY_CARE_PROVIDER_SITE_OTHER): Payer: Managed Care, Other (non HMO)

## 2013-06-17 DIAGNOSIS — Z0000 Encounter for general adult medical examination without abnormal findings: Secondary | ICD-10-CM

## 2013-06-17 LAB — BASIC METABOLIC PANEL
BUN: 22 mg/dL (ref 6–23)
CO2: 25 mEq/L (ref 19–32)
Chloride: 96 mEq/L (ref 96–112)
Creatinine, Ser: 1.1 mg/dL (ref 0.4–1.5)

## 2013-06-17 LAB — HEPATIC FUNCTION PANEL
Bilirubin, Direct: 0.1 mg/dL (ref 0.0–0.3)
Total Protein: 6.6 g/dL (ref 6.0–8.3)

## 2013-06-17 LAB — CBC WITH DIFFERENTIAL/PLATELET
Basophils Relative: 0.7 % (ref 0.0–3.0)
Eosinophils Absolute: 0.1 10*3/uL (ref 0.0–0.7)
HCT: 41.1 % (ref 39.0–52.0)
Lymphs Abs: 1.5 10*3/uL (ref 0.7–4.0)
MCHC: 33 g/dL (ref 30.0–36.0)
MCV: 82.2 fl (ref 78.0–100.0)
Monocytes Absolute: 0.4 10*3/uL (ref 0.1–1.0)
Neutrophils Relative %: 56.6 % (ref 43.0–77.0)
Platelets: 234 10*3/uL (ref 150.0–400.0)
RBC: 5.01 Mil/uL (ref 4.22–5.81)

## 2013-06-17 LAB — POCT URINALYSIS DIPSTICK
Bilirubin, UA: NEGATIVE
Spec Grav, UA: 1.01
Urobilinogen, UA: 0.2
pH, UA: 5

## 2013-06-17 LAB — HEMOGLOBIN A1C: Hgb A1c MFr Bld: 16.1 % — ABNORMAL HIGH (ref 4.6–6.5)

## 2013-06-17 LAB — MICROALBUMIN / CREATININE URINE RATIO
Creatinine,U: 60.6 mg/dL
Microalb Creat Ratio: 1.2 mg/g (ref 0.0–30.0)

## 2013-06-17 LAB — LIPID PANEL
HDL: 50.1 mg/dL (ref >39.00)
Triglycerides: 94 mg/dL (ref 0.0–149.0)

## 2013-06-17 LAB — TSH: TSH: 0.58 u[IU]/mL (ref 0.35–5.50)

## 2013-06-21 ENCOUNTER — Encounter: Payer: Self-pay | Admitting: Family Medicine

## 2013-06-21 ENCOUNTER — Ambulatory Visit (INDEPENDENT_AMBULATORY_CARE_PROVIDER_SITE_OTHER): Payer: Managed Care, Other (non HMO) | Admitting: Family Medicine

## 2013-06-21 VITALS — BP 100/60 | Temp 98.3°F | Wt 172.0 lb

## 2013-06-21 DIAGNOSIS — E108 Type 1 diabetes mellitus with unspecified complications: Secondary | ICD-10-CM

## 2013-06-21 DIAGNOSIS — E1065 Type 1 diabetes mellitus with hyperglycemia: Secondary | ICD-10-CM

## 2013-06-21 NOTE — Patient Instructions (Signed)
Drink lots of water  No sugar  I gave you a dose of insulin 20 units at 3:50 PM,,,,,,,,,,,,,,,,, take 20 units at 10 PM tonight and return at 8:15 in the morning for followup  Check your blood sugar at dinnertime and before you give yourself your evening dose and in the morning before you come  Have your wife come back with you

## 2013-06-21 NOTE — Progress Notes (Signed)
  Subjective:    Patient ID: [name], male    DOB: 08/21/1946, 67 y.o.   MRN: [id number]  HPI Christopher [name] is a 67 year old male married nonsmoker who comes in today because I asked him to.  He had pre-physical labs 4 days ago A1c was 16 with a blood sugar 492. Previous blood sugar a year ago was 174 with an A1c of 6.9%.  He states he's noticed blurred vision and a 15 pound weight loss over the last 3 months increased thirst increased urination and fatigue.he ishe is taking his metformin 500 mg twice a day   Review of Systems Review of systems otherwise negative    Objective:   Physical Exam   well-developed well-nourished thin male weight 172 no acute distress BP 100/60  We had a long discussion about diabetes treatment options including the induction of insulin. At this point he needs insulin. I gave him his first shot of 7525 at 3:50 PM in the afternoon. I will have him monitor his blood sugar carefully and check them every 24 hours.      Assessment & Plan:  Diabetes type 1 begin insulin monitor blood sugar followup tomorrow with wife

## 2013-06-22 ENCOUNTER — Ambulatory Visit (INDEPENDENT_AMBULATORY_CARE_PROVIDER_SITE_OTHER): Payer: Managed Care, Other (non HMO) | Admitting: Family Medicine

## 2013-06-22 ENCOUNTER — Encounter: Payer: Self-pay | Admitting: Family Medicine

## 2013-06-22 VITALS — BP 110/80 | Temp 97.9°F | Wt 175.0 lb

## 2013-06-22 DIAGNOSIS — E1065 Type 1 diabetes mellitus with hyperglycemia: Secondary | ICD-10-CM

## 2013-06-22 MED ORDER — INSULIN PEN NEEDLE 32G X 4 MM MISC: 1.0000 | Freq: Every day | Status: DC

## 2013-06-22 MED ORDER — INSULIN LISPRO PROT & LISPRO (75-25 MIX) 100 UNIT/ML ~~LOC~~ SUSP: 10.0000 [IU] | Freq: Every day | SUBCUTANEOUS | Status: DC

## 2013-06-22 NOTE — Progress Notes (Signed)
  Subjective:    Patient ID: Christopher [name], male    DOB: 12/27/1945, 67 y.o.   MRN: [id number]  HPI Christopher [name] is a 67 year old male who comes today accompanied by his wife for evaluation of new-onset diabetes type 1  We saw him yesterday with a blood sugar over 400 in an A1c of 16%. We gave him 20 units of 7525 insulin here in the office and he took another 20 units at bedtime last night. This morning's blood sugar was 181.  We spent a good deal of time talking about type 1 diabetes treatment options etc. Etc.   Review of Systems    review of systems negative except the persistent blurred vision and nocturia which will abate over time Objective:   Physical Exam Well-developed well-nourished male in no acute distress weight 175 BP 110/80       Assessment & Plan:  New-onset diabetes type 1 start basic insulin 10 units each bedtime followup Thursday

## 2013-06-22 NOTE — Patient Instructions (Addendum)
Take 10 units of insulin at bedtime  Check a fasting blood sugar once a day in the morning  If at any time during the day you feel weak shaky or nervous check a blood sugar,,,,,,,,, if your blood sugars less than 80,,,,,,, have a snack  Drink lots of water  Normal activities  Followup on Thursday

## 2013-06-23 ENCOUNTER — Telehealth: Payer: Self-pay | Admitting: Family Medicine

## 2013-06-23 NOTE — Telephone Encounter (Signed)
Patient took 10 units of insuline last night and this morning his glucose was at 315.  Patient feels "okay".  Please advise

## 2013-06-23 NOTE — Telephone Encounter (Signed)
Christopher [name] let's increase his evening dose of insulin to 20 units. He'll return tomorrow for followup

## 2013-06-23 NOTE — Telephone Encounter (Signed)
Pt was told to call Fleet Contras this am if symptoms were back. Pt would like a call back ASAP.

## 2013-06-23 NOTE — Telephone Encounter (Signed)
Spoke with patient.

## 2013-06-24 ENCOUNTER — Ambulatory Visit (INDEPENDENT_AMBULATORY_CARE_PROVIDER_SITE_OTHER): Payer: Managed Care, Other (non HMO) | Admitting: Family Medicine

## 2013-06-24 ENCOUNTER — Encounter: Payer: Self-pay | Admitting: Family Medicine

## 2013-06-24 VITALS — BP 104/68 | Temp 97.7°F | Wt 177.0 lb

## 2013-06-24 DIAGNOSIS — E1065 Type 1 diabetes mellitus with hyperglycemia: Secondary | ICD-10-CM

## 2013-06-24 DIAGNOSIS — E108 Type 1 diabetes mellitus with unspecified complications: Secondary | ICD-10-CM

## 2013-06-24 DIAGNOSIS — R350 Frequency of micturition: Secondary | ICD-10-CM

## 2013-06-24 NOTE — Patient Instructions (Signed)
Increase your nightly insulin to 30 units tonight,,,,,,,,, Friday,,,,,, Saturday,,,,, and Sunday  If by Monday morning your fasting blood sugar is around 120 continue that 30 units at bedtime  If your fasting blood sugar is not 120 increase your dose to 40 units starting Monday night  Followup on Thursday of next week

## 2013-06-24 NOTE — Progress Notes (Signed)
  Subjective:    Patient ID: Christopher [name], male    DOB: 1946/07/12, 67 y.o.   MRN: [id number]  HPI Christopher [name] is a 67 year old married male nonsmoker who comes in today for followup of new-onset diabetes type 1  We've been working with him as an outpatient over the last week. His blood sugar was over 400. A1c had shot up to 16%. He's on 20 units of insulin nightly and his blood sugar now is down to 288.   Review of Systems Review of systems negative no hypoglycemia    Objective:   Physical Exam  Well-developed well-nourished male no acute distress BP 104/68 weight stable 177      Assessment & Plan:  Diabetes type 1 increase insulin followup in one week

## 2013-06-29 ENCOUNTER — Encounter: Payer: Managed Care, Other (non HMO) | Admitting: Family Medicine

## 2013-07-01 ENCOUNTER — Ambulatory Visit (INDEPENDENT_AMBULATORY_CARE_PROVIDER_SITE_OTHER): Payer: Managed Care, Other (non HMO) | Admitting: Family Medicine

## 2013-07-01 ENCOUNTER — Encounter: Payer: Self-pay | Admitting: Family Medicine

## 2013-07-01 VITALS — BP 118/80 | Temp 98.6°F | Ht 76.0 in | Wt 191.0 lb

## 2013-07-01 DIAGNOSIS — I10 Essential (primary) hypertension: Secondary | ICD-10-CM

## 2013-07-01 DIAGNOSIS — I872 Venous insufficiency (chronic) (peripheral): Secondary | ICD-10-CM

## 2013-07-01 DIAGNOSIS — E108 Type 1 diabetes mellitus with unspecified complications: Secondary | ICD-10-CM

## 2013-07-01 DIAGNOSIS — E1065 Type 1 diabetes mellitus with hyperglycemia: Secondary | ICD-10-CM

## 2013-07-01 DIAGNOSIS — E785 Hyperlipidemia, unspecified: Secondary | ICD-10-CM

## 2013-07-01 DIAGNOSIS — E119 Type 2 diabetes mellitus without complications: Secondary | ICD-10-CM

## 2013-07-01 MED ORDER — HYDROCHLOROTHIAZIDE 25 MG PO TABS: 25.0000 mg | ORAL_TABLET | Freq: Every day | ORAL | Status: DC

## 2013-07-01 MED ORDER — LISINOPRIL 2.5 MG PO TABS: 2.5000 mg | ORAL_TABLET | Freq: Every day | ORAL | Status: DC

## 2013-07-01 MED ORDER — METFORMIN HCL 500 MG PO TABS: 500.0000 mg | ORAL_TABLET | Freq: Two times a day (BID) | ORAL | Status: DC

## 2013-07-01 MED ORDER — SIMVASTATIN 40 MG PO TABS: ORAL_TABLET | ORAL | Status: DC

## 2013-07-01 NOTE — Patient Instructions (Addendum)
Takes 30 units of insulin at bedtime however if you go to gym and workout take only 25 units at night. He fasting blood sugar in the morning should be between the 101 30  If your fasting blood sugar in the morning as consistently below 100 decrease your evening insulin by 5 units  Continue to check a fasting blood sugar daily in the morning and return to see me in 6 weeks for followup  Continue your other medications

## 2013-07-01 NOTE — Progress Notes (Signed)
  Subjective:    Patient ID: Christopher [name], male    DOB: [birth year], 67 y.o.   MRN: [id number]  HPI Christopher [name] is a 67 year old married male nonsmoker who comes in today for general physical examination  We had done his primary labs a week ago and unfortunately his blood sugar went up to 492 his hemoglobin A1c will which had been 6.9 range which is 16. We therefore brought him in immediately start him on insulin and everted a hospital admission. His fasting blood sugar this morning was 103.  He states overall he feels much better his weight is stabilized he's not up all night urinating.  The rest of his med list reviewed there've been no changes  Vaccinations up-to-date  His cognitive function is normal he exercises on a regular basis weight is 191 pounds a 6 foot 4, home health safety reviewed no issues identified, no guns in the house, he does have a health care power of attorney and living well Review of Systems  Constitutional: Negative.   HENT: Negative.   Eyes: Negative.   Respiratory: Negative.   Cardiovascular: Negative.   Gastrointestinal: Negative.   Genitourinary: Negative.   Musculoskeletal: Negative.   Skin: Negative.   Neurological: Negative.   Psychiatric/Behavioral: Negative.        Objective:   Physical Exam  Constitutional: He is oriented to person, place, and time. He appears well-developed and well-nourished.  HENT:  Head: Normocephalic and atraumatic.  Right Ear: External ear normal.  Left Ear: External ear normal.  Nose: Nose normal.  Mouth/Throat: Oropharynx is clear and moist.  Eyes: Conjunctivae and EOM are normal. Pupils are equal, round, and reactive to light.  Neck: Normal range of motion. Neck supple. No JVD present. No tracheal deviation present. No thyromegaly present.  Cardiovascular: Normal rate, regular rhythm, normal heart sounds and intact distal pulses.  Exam reveals no gallop and no friction rub.   No murmur heard. Pulmonary/Chest: Effort  normal and breath sounds normal. No stridor. No respiratory distress. He has no wheezes. He has no rales. He exhibits no tenderness.  Abdominal: Soft. Bowel sounds are normal. He exhibits no distension and no mass. There is no tenderness. There is no rebound and no guarding.  Genitourinary: Rectum normal, prostate normal and penis normal. Guaiac negative stool. No penile tenderness.  No ED  Musculoskeletal: Normal range of motion. He exhibits no edema and no tenderness.  Lymphadenopathy:    He has no cervical adenopathy.  Neurological: He is alert and oriented to person, place, and time. He has normal reflexes. No cranial nerve deficit. He exhibits normal muscle tone.  Skin: Skin is warm and dry. No rash noted. No erythema. No pallor.  Psychiatric: He has a normal mood and affect. His behavior is normal. Judgment and thought content normal.          Assessment & Plan:  Healthy male  New onset of diabetes type 1 continue 30 units of insulin nightly at bedtime metformin 500 mg twice a day  History of glaucoma followed by ophthalmology  Hyperlipidemia continue Zocor 60 mg daily  Allergic rhinitis continue OTC plain Zyrtec  Hypertension well controlled on lisinopril 2.5 mg and hydrochlorothiazide 25 mg BP 118/80

## 2013-07-20 ENCOUNTER — Encounter: Payer: Managed Care, Other (non HMO) | Attending: Family Medicine | Admitting: *Deleted

## 2013-07-20 ENCOUNTER — Encounter: Payer: Self-pay | Admitting: *Deleted

## 2013-07-20 ENCOUNTER — Telehealth: Payer: Self-pay | Admitting: Family Medicine

## 2013-07-20 VITALS — Ht 74.5 in | Wt 192.1 lb

## 2013-07-20 DIAGNOSIS — E1065 Type 1 diabetes mellitus with hyperglycemia: Secondary | ICD-10-CM

## 2013-07-20 DIAGNOSIS — Z713 Dietary counseling and surveillance: Secondary | ICD-10-CM | POA: Insufficient documentation

## 2013-07-20 DIAGNOSIS — E119 Type 2 diabetes mellitus without complications: Secondary | ICD-10-CM | POA: Insufficient documentation

## 2013-07-20 NOTE — Telephone Encounter (Signed)
Pt states he was unable to get any of his RX filled by pharm. They only gave a few of everything. Even though it looks like we sent in script w/ refills on 7/17, they did not fill these RX. Pls advise  Pt needs : metFORMIN (GLUCOPHAGE) 500 MG tablet 2/x day simvastatin (ZOCOR) 40 MG tablet 1.5/day lisinopril (PRINIVIL,ZESTRIL) 2.5 MG tablet  1 / day hydrochlorothiazide (HYDRODIURIL) 25 MG tablet Pharm: [facility]/ [address]  Pt has resceduled his appt w/ dr Tawanna Cooler for 10/2

## 2013-07-20 NOTE — Telephone Encounter (Signed)
Spoke with pharmacy and patient has refills "on hold".  Spoke with patient.

## 2013-07-20 NOTE — Progress Notes (Signed)
Appt start time: 1530 end time:  1630.  Assessment:  Patient was seen on  07/20/2013 for individual diabetes education. Lives with wife and 66 yo grand daughter. Share grocery shopping and both prepare meals. Retiring tomorrow from [employer]. Walks on treadmill for about 35 minutes 6 days a week. SMBG every AM, average reported range is 75-150 mg/dl. Taking Humalog 75/25 @ 30 units every evening before bed. He expresses interest in attending our Diabetes Core Classes.  Current HbA1c: 16.1% on 06/17/2013  MEDICATIONS: see list   DIETARY INTAKE:  Usual eating pattern includes 3 meals and 0 snacks per day.  Everyday foods include good variety of all food groups.  Avoided foods include coffee, regular sodas, asparagus, has been avoiding  white potatoes.    24-hr recall:  B ( AM): granola, at least 1 cup yogurt and nuts, occasionally hot tea  Snk ( AM): no  L ( PM): bring soup and salad or chicken sandwich, water Snk ( PM): no D ( PM): meat, brown starch and vegetable type meal, water or diet soda Snk ( PM): no Beverages: hot tea, water, diet soda  Usual physical activity: treadmill daily  Estimated energy needs: 1800 calories 200 g carbohydrates 135 g protein 50 g fat  Progress Towards Goal(s):  In progress.   Nutritional Diagnosis:  NB-1.1 Food and nutrition-related knowledge deficit As related to diabetes control.  As evidenced by A1c of 16.1% in July 2014    Intervention:  Nutrition counseling provided.  Discussed diabetes disease process and treatment options.  Discussed physiology of diabetes and role of obesity on insulin resistance.   Discussed role of medications and diet in glucose control   Reviewed patient medications.  Spent most of visit reviewing insulin action and explaining that Humalog 75/25 will partially peak 1-2 hours after injection for breakfast meal and then again 6-8 hours later for lunch meal. Suggested he discuss taking it in AM prior to breakfast with  his MD rather than at night when these 2 peak times could cause hypoglycemia during the night. Discussed role of medication on blood glucose and possible side effects  Discussed blood glucose monitoring and interpretation.  Discussed recommended target ranges and individual ranges.    Described short-term complications: hyper- and hypo-glycemia.  Discussed causes,symptoms, and treatment options.   At next visit plan to:  Provide education on macronutrients on glucose levels.  Plan to provide education on carb counting, importance of regularly scheduled meals/snacks, and meal planning  Discuss effects of physical activity on glucose levels and long-term glucose control.    Discuss role of stress on blood glucose levels and discussed strategies to manage psychosocial issues.  Discuss recommendations for long-term diabetes self-care.  Established checklist for medical, dental, and emmotional self-care.  Discuss prevention, detection, and treatment of long-term complications.  Discussed the role of prolonged elevated glucose levels on body systems. Handouts given during visit include: Living Well with Diabetes Carb Counting and Food Label handouts Meal Plan Card Insulin action handout  Barriers to learning/adherance to lifestyle change: stress of very high A1c when it has been relatively well controlled in the past.  Diabetes self-care support plan: continued diabetes education  Monitoring/Evaluation:  Dietary intake, exercise, best time to take insulin, and body weight in 4 week(s).

## 2013-08-12 ENCOUNTER — Ambulatory Visit: Payer: Managed Care, Other (non HMO) | Admitting: Family Medicine

## 2013-08-14 NOTE — Patient Instructions (Signed)
Plan:  Consider checking BG at alternate times per day as directed by MD  Consider asking MD about taking medication Humalin 75/25 in AM vs. At bedtime so it can cover day time meals  Continue eating 3 meals a day and we will cover Carb Counting at next visit

## 2013-08-25 ENCOUNTER — Ambulatory Visit (INDEPENDENT_AMBULATORY_CARE_PROVIDER_SITE_OTHER): Payer: Managed Care, Other (non HMO) | Admitting: Nurse Practitioner

## 2013-08-25 ENCOUNTER — Encounter: Payer: Self-pay | Admitting: Nurse Practitioner

## 2013-08-25 VITALS — BP 102/72 | HR 69 | Temp 98.0°F | Ht 76.0 in | Wt 210.5 lb

## 2013-08-25 DIAGNOSIS — R609 Edema, unspecified: Secondary | ICD-10-CM

## 2013-08-25 DIAGNOSIS — Z23 Encounter for immunization: Secondary | ICD-10-CM

## 2013-08-25 LAB — URINALYSIS, ROUTINE W REFLEX MICROSCOPIC
Nitrite: NEGATIVE
Total Protein, Urine: NEGATIVE
pH: 5.5 (ref 5.0–8.0)

## 2013-08-25 LAB — BASIC METABOLIC PANEL
BUN: 13 mg/dL (ref 6–23)
CO2: 28 mEq/L (ref 19–32)
Chloride: 100 mEq/L (ref 96–112)
Potassium: 3.6 mEq/L (ref 3.5–5.1)

## 2013-08-25 LAB — HEPATIC FUNCTION PANEL
ALT: 18 U/L (ref 0–53)
Albumin: 3.8 g/dL (ref 3.5–5.2)
Total Protein: 6.4 g/dL (ref 6.0–8.3)

## 2013-08-25 NOTE — Patient Instructions (Addendum)
I am not certain what is causing your swelling. There are several possibilities. I will rule out blood clots and kidney & liver issues with bloos tests today. The most likely cause is a side effect of insulin. This is not a reason to stop insulin, keep taking it. I will call you with lab results. I may order a test to evaluate the blood flow in your legs, depending on your tests results. Please follow up w/Dr. Tawanna Cooler in 2 weeks.  Peripheral Edema You have swelling in your legs (peripheral edema). This swelling is due to excess accumulation of salt and water in your body. Edema may be a sign of heart, kidney or liver disease, or a side effect of a medication. It may also be due to problems in the leg veins. Elevating your legs and using special support stockings may be very helpful, if the cause of the swelling is due to poor venous circulation. Avoid long periods of standing, whatever the cause. Treatment of edema depends on identifying the cause. Chips, pretzels, pickles and other salty foods should be avoided. Restricting salt in your diet is almost always needed. Water pills (diuretics) are often used to remove the excess salt and water from your body via urine. These medicines prevent the kidney from reabsorbing sodium. This increases urine flow. Diuretic treatment may also result in lowering of potassium levels in your body. Potassium supplements may be needed if you have to use diuretics daily. Daily weights can help you keep track of your progress in clearing your edema. You should call your caregiver for follow up care as recommended. SEEK IMMEDIATE MEDICAL CARE IF:   You have increased swelling, pain, redness, or heat in your legs.  You develop shortness of breath, especially when lying down.  You develop chest or abdominal pain, weakness, or fainting.  You have a fever. Document Released: 01/09/2005 Document Revised: 02/24/2012 Document Reviewed: 12/20/2009 [facility] Patient Information 2014  [place], [place].

## 2013-08-25 NOTE — Progress Notes (Signed)
Subjective:    Christopher [name] is a 67 y.o. male who presents for evaluation of edema in both feet. The edema has been moderate. Onset of symptoms was 4 days ago, and patient reports symptoms have stabilized since that time. The edema is present all day. The patient states the problem is new. The swelling has been aggravated by nothing and although he just returned from a 2 week vacation-he states his diet may have contianed more salt than usual.. The swelling has been relieved by nothing. Associated factors include: started insulin about 7 weeks ago. Started using a new bottle the day swelling began. Home blood sugars have been running between 100-110 with 1 exception: 1 am fasting sugar of 158 in the last 2 weeks.. Cardiac risk factors: advanced age (older than 55 for men, 50 for women), diabetes mellitus, dyslipidemia, hypertension and male gender.    The following portions of the patient's history were reviewed and updated as appropriate: allergies, current medications, past medical history and problem list.  Review of Systems Constitutional: negative for fatigue, fevers, night sweats and weight loss Respiratory: negative for cough, dyspnea on exertion, sputum and wheezing Cardiovascular: negative for chest pain, chest pressure/discomfort, fatigue, irregular heart beat, near-syncope and positive for LE edema Genitourinary:negative for nocturia Musculoskeletal:positive for cool feet, negative for arthralgias, muscle weakness and myalgias Endocrine: negative for diabetic symptoms including blurry vision, polydipsia, polyphagia and polyuria   Objective:    BP 102/72  Pulse 69  Temp(Src) 98 F (36.7 C) (Oral)  Ht 6\' 4"  (1.93 m)  Wt 210 lb 8 oz (95.482 kg)  BMI 25.63 kg/m2  SpO2 93% General appearance: alert, cooperative, appears stated age and no distress Head: Normocephalic, without obvious abnormality, atraumatic Eyes: negative findings: lids and lashes normal and conjunctivae and sclerae  normal Lungs: clear to auscultation bilaterally Heart: regular rate and rhythm, S1, S2 normal, no murmur, click, rub or gallop Extremities: edema +1 pitting feet & just above ankles bilat and Homans sign is negative, no sign of DVT Pulses: unable to palpate pedal or dorsalis pedis pulses after pressing fluid out of way, cap refill about 2 sec, toes cool Skin: Skin color, texture, turgor normal. No rashes or lesions   Cardiographics ECG: strip from 7/14 shows NSR    Assessment:     Edema likely secondary to starting insulin. DD: thrombus, kidney disease, PVD, Heart failure    Plan:    Check d-dimer to r/u thrombus, bmet to eval kidney func., hep panel to eval liver. Plan LW doppler of d-dimer elevated. The patient was also instructed to call IMMEDIATELY (i.e., day or night) if any cardiopulmonary symptoms occur, especially chest pain, shortness of breath, dyspnea on exertion, paroxysmal nocturnal dyspnea, or orthopnea, and these were explained. See pt instructions. Follow up in 2 weeks and as needed.

## 2013-08-26 ENCOUNTER — Telehealth: Payer: Self-pay | Admitting: Nurse Practitioner

## 2013-08-26 NOTE — Telephone Encounter (Signed)
I will be able to set up the doppler US at [facility] pretty quickly. Please let me know when you talk to the patient so that I know he is aware of the procedure.Thanks, Diane

## 2013-08-26 NOTE — Telephone Encounter (Signed)
Ua indicates pt is dehydrated:SG 1.030, no ketones, protein, or Hgb in urine. Has 500 glucose in urine/196 blood. Lytes are normal, but blood is likely concentrated due to mild dehydration. Elevated d-dimer, even for age adjustment: may be r/t diabetes, but will f/u w/ LE doppler Hep func normal. LM to discuss w/pt.

## 2013-08-26 NOTE — Telephone Encounter (Signed)
Elevated d-dimer. Discussed potential outcome-stroke or PE if he has thrombi. Also discussed that suspicion index is moderate to low because swelling is bilateral, may be elevated due to DM, & swelling has improved in last 24 hours. However, clearly stated that cannot rule out thrombus without doppler studies. Pt discovered today, that he does not have insurance until 10/1-remains hesitant to have LE doppler performed due to cost. Potential risk is clearly stated.

## 2013-08-27 ENCOUNTER — Telehealth: Payer: Self-pay | Admitting: Nurse Practitioner

## 2013-08-27 NOTE — Telephone Encounter (Signed)
See phone note. Pt declined having LE doppler due to cost. Understands potential risk of stroke or PE. Understands s&s of both conditions & need to seek treatment at ED immediATELY.

## 2013-08-27 NOTE — Telephone Encounter (Signed)
The price for the procedure CPT code 16109 alone is $542.00. This is an estimate as that there may be additional charges for the technician. [facility] does have financial assistance forms available at the checkout counter.

## 2013-09-16 ENCOUNTER — Ambulatory Visit: Payer: Managed Care, Other (non HMO) | Admitting: Family Medicine

## 2013-10-07 ENCOUNTER — Encounter: Payer: Self-pay | Admitting: Family Medicine

## 2013-10-07 ENCOUNTER — Ambulatory Visit (INDEPENDENT_AMBULATORY_CARE_PROVIDER_SITE_OTHER): Payer: Managed Care, Other (non HMO) | Admitting: Family Medicine

## 2013-10-07 VITALS — BP 110/80 | Temp 97.7°F | Wt 212.0 lb

## 2013-10-07 DIAGNOSIS — IMO0002 Reserved for concepts with insufficient information to code with codable children: Secondary | ICD-10-CM

## 2013-10-07 DIAGNOSIS — E1065 Type 1 diabetes mellitus with hyperglycemia: Secondary | ICD-10-CM

## 2013-10-07 LAB — BASIC METABOLIC PANEL
BUN: 17 mg/dL (ref 6–23)
CO2: 26 mEq/L (ref 19–32)
Calcium: 9.3 mg/dL (ref 8.4–10.5)
Creatinine, Ser: 1.1 mg/dL (ref 0.4–1.5)
GFR: 88.85 mL/min (ref >60.00)
Glucose, Bld: 208 mg/dL — ABNORMAL HIGH (ref 70–99)
Potassium: 3.7 mEq/L (ref 3.5–5.1)
Sodium: 139 mEq/L (ref 135–145)

## 2013-10-07 LAB — MICROALBUMIN / CREATININE URINE RATIO: Microalb, Ur: 0.8 mg/dL (ref 0.0–1.9)

## 2013-10-07 LAB — HEMOGLOBIN A1C: Hgb A1c MFr Bld: 7.8 % — ABNORMAL HIGH (ref 4.6–6.5)

## 2013-10-07 NOTE — Patient Instructions (Addendum)
Continue your current treatment program  Followup in 3 months  Sooner if any problems  Goal for fasting blood sugar in the morning is 80-120

## 2013-10-07 NOTE — Progress Notes (Signed)
  Subjective:    Patient ID: Christopher [name], male    DOB: [birth year], 67 y.o.   MRN: [id number]  HPI Christopher [name] is a 67 year old male recently graduated from type II to type 1 diabetes who comes in today for followup  He's taking insulin 7525,,,,,,, dose 26 units daily morning blood sugars averaging 120,   Review of Systems    Review of systems negative no hypoglycemia. He did have hypoglycemia and 30 units a day  Objective:   Physical Exam  Well-developed and nourished male no acute distress vital signs stable he is afebrile      Assessment & Plan:Diabetes type 1 check labs followup in 3 months  Diabetes type 1,,,,,,,, check labs followup in 3 months

## 2013-10-11 ENCOUNTER — Telehealth: Payer: Self-pay | Admitting: Family Medicine

## 2013-10-11 DIAGNOSIS — E1065 Type 1 diabetes mellitus with hyperglycemia: Secondary | ICD-10-CM

## 2013-10-11 NOTE — Telephone Encounter (Signed)
Please call patient back to relay his lab information. Thank you.

## 2013-10-11 NOTE — Telephone Encounter (Signed)
Patient is aware and labs ordered. 

## 2013-12-06 ENCOUNTER — Telehealth: Payer: Self-pay | Admitting: Family Medicine

## 2013-12-06 NOTE — Telephone Encounter (Signed)
RN returned call to pt calling about prescription issue. RN reached voicemail and left message to call office back  Aundra Millet RN / CAN

## 2013-12-23 ENCOUNTER — Other Ambulatory Visit (INDEPENDENT_AMBULATORY_CARE_PROVIDER_SITE_OTHER): Payer: Medicare PPO

## 2013-12-23 DIAGNOSIS — E108 Type 1 diabetes mellitus with unspecified complications: Principal | ICD-10-CM

## 2013-12-23 DIAGNOSIS — IMO0002 Reserved for concepts with insufficient information to code with codable children: Secondary | ICD-10-CM

## 2013-12-23 DIAGNOSIS — E1065 Type 1 diabetes mellitus with hyperglycemia: Secondary | ICD-10-CM

## 2013-12-23 LAB — MICROALBUMIN / CREATININE URINE RATIO
CREATININE, U: 266.2 mg/dL
Microalb Creat Ratio: 0.2 mg/g (ref 0.0–30.0)
Microalb, Ur: 0.5 mg/dL (ref 0.0–1.9)

## 2013-12-23 LAB — BASIC METABOLIC PANEL
BUN: 18 mg/dL (ref 6–23)
CO2: 26 mEq/L (ref 19–32)
Calcium: 9.4 mg/dL (ref 8.4–10.5)
Chloride: 106 mEq/L (ref 96–112)
Creatinine, Ser: 1.2 mg/dL (ref 0.4–1.5)
GFR: 74.9 mL/min (ref >60.00)
Glucose, Bld: 102 mg/dL — ABNORMAL HIGH (ref 70–99)
Potassium: 3.3 mEq/L — ABNORMAL LOW (ref 3.5–5.1)
SODIUM: 140 meq/L (ref 135–145)

## 2013-12-23 LAB — HEMOGLOBIN A1C: HEMOGLOBIN A1C: 8 % — AB (ref 4.6–6.5)

## 2013-12-30 ENCOUNTER — Ambulatory Visit (INDEPENDENT_AMBULATORY_CARE_PROVIDER_SITE_OTHER): Payer: Medicare PPO | Admitting: Family Medicine

## 2013-12-30 ENCOUNTER — Encounter: Payer: Self-pay | Admitting: Family Medicine

## 2013-12-30 VITALS — BP 118/80 | Temp 98.3°F | Wt 212.0 lb

## 2013-12-30 DIAGNOSIS — IMO0002 Reserved for concepts with insufficient information to code with codable children: Secondary | ICD-10-CM

## 2013-12-30 DIAGNOSIS — E1065 Type 1 diabetes mellitus with hyperglycemia: Secondary | ICD-10-CM

## 2013-12-30 DIAGNOSIS — E108 Type 1 diabetes mellitus with unspecified complications: Secondary | ICD-10-CM

## 2013-12-30 DIAGNOSIS — E785 Hyperlipidemia, unspecified: Secondary | ICD-10-CM

## 2013-12-30 MED ORDER — METFORMIN HCL 1000 MG PO TABS: ORAL_TABLET | ORAL | Status: DC

## 2013-12-30 NOTE — Progress Notes (Signed)
Pre visit review using our clinic review tool, if applicable. No additional management support is needed unless otherwise documented below in the visit note. 

## 2013-12-30 NOTE — Patient Instructions (Signed)
Increase the metformin,,,,,,,, take 1000 mg before breakfast,,,,,,,, continue the 500 mg dose prior to your evening male  Continue the insulin at 30 units at bedtime unless she started experiencing significant hypoglycemia,,,,,,,,,, decrease your evening insulin to 25 units  Followup in 3 months  Labs one week prior

## 2013-12-30 NOTE — Progress Notes (Signed)
   Subjective:    Patient ID: Christopher [name], male    DOB: 09/26/1946, 68 y.o.   MRN: [id number]  HPI Christopher [name] is a 68 year old male who comes in today for evaluation of type 1 diabetes  6 months ago his blood sugar became markedly elevated and his A1c squint to 16. With induction of insulin his A1c dropped to 7.82 months ago. Now it's 8.0%. However his fasting blood sugars 102   Review of Systems    review of systems negative Objective:   Physical Exam Well-developed and nourished thin male no acute distress vital signs stable he is afebrile BP 118/80       Assessment & Plan:  Diabetes type 1 not at goal increase morning metformin

## 2013-12-31 ENCOUNTER — Telehealth: Payer: Self-pay

## 2013-12-31 NOTE — Telephone Encounter (Signed)
Relevant patient education assigned to patient using Emmi. ° °

## 2014-01-20 ENCOUNTER — Telehealth: Payer: Self-pay | Admitting: Family Medicine

## 2014-01-20 DIAGNOSIS — E1065 Type 1 diabetes mellitus with hyperglycemia: Secondary | ICD-10-CM

## 2014-01-20 DIAGNOSIS — I872 Venous insufficiency (chronic) (peripheral): Secondary | ICD-10-CM

## 2014-01-20 DIAGNOSIS — I10 Essential (primary) hypertension: Secondary | ICD-10-CM

## 2014-01-20 DIAGNOSIS — E119 Type 2 diabetes mellitus without complications: Secondary | ICD-10-CM

## 2014-01-20 DIAGNOSIS — E108 Type 1 diabetes mellitus with unspecified complications: Secondary | ICD-10-CM

## 2014-01-20 DIAGNOSIS — IMO0002 Reserved for concepts with insufficient information to code with codable children: Secondary | ICD-10-CM

## 2014-01-20 MED ORDER — METFORMIN HCL 1000 MG PO TABS: ORAL_TABLET | ORAL | Status: DC

## 2014-01-20 MED ORDER — LISINOPRIL 2.5 MG PO TABS: 2.5000 mg | ORAL_TABLET | Freq: Every day | ORAL | Status: DC

## 2014-01-20 MED ORDER — HYDROCHLOROTHIAZIDE 25 MG PO TABS: 25.0000 mg | ORAL_TABLET | Freq: Every day | ORAL | Status: DC

## 2014-01-20 NOTE — Telephone Encounter (Signed)
[facility] requesting new scripts for the following:  hydrochlorothiazide (HYDRODIURIL) 25 MG tablet lisinopril (PRINIVIL,ZESTRIL) 2.5 MG tablet metFORMIN (GLUCOPHAGE) BD SINGLE USE SWAB

## 2014-01-20 NOTE — Telephone Encounter (Signed)
Rx sent in

## 2014-01-21 ENCOUNTER — Other Ambulatory Visit: Payer: Self-pay | Admitting: *Deleted

## 2014-01-21 DIAGNOSIS — I872 Venous insufficiency (chronic) (peripheral): Secondary | ICD-10-CM

## 2014-01-21 DIAGNOSIS — E108 Type 1 diabetes mellitus with unspecified complications: Secondary | ICD-10-CM

## 2014-01-21 DIAGNOSIS — IMO0002 Reserved for concepts with insufficient information to code with codable children: Secondary | ICD-10-CM

## 2014-01-21 DIAGNOSIS — E119 Type 2 diabetes mellitus without complications: Secondary | ICD-10-CM

## 2014-01-21 DIAGNOSIS — E785 Hyperlipidemia, unspecified: Secondary | ICD-10-CM

## 2014-01-21 DIAGNOSIS — E1065 Type 1 diabetes mellitus with hyperglycemia: Secondary | ICD-10-CM

## 2014-01-21 DIAGNOSIS — I10 Essential (primary) hypertension: Secondary | ICD-10-CM

## 2014-01-21 MED ORDER — SIMVASTATIN 40 MG PO TABS
ORAL_TABLET | ORAL | Status: DC
Start: 2014-01-21 — End: 2014-12-19

## 2014-01-21 MED ORDER — INSULIN LISPRO PROT & LISPRO (75-25 MIX) 100 UNIT/ML ~~LOC~~ SUSP: 30.0000 [IU] | Freq: Every day | SUBCUTANEOUS | Status: DC

## 2014-01-21 MED ORDER — METFORMIN HCL 500 MG PO TABS: 500.0000 mg | ORAL_TABLET | Freq: Two times a day (BID) | ORAL | Status: DC

## 2014-01-21 MED ORDER — METFORMIN HCL 1000 MG PO TABS
ORAL_TABLET | ORAL | Status: DC
Start: 2014-01-21 — End: 2014-12-19

## 2014-01-24 ENCOUNTER — Other Ambulatory Visit: Payer: Self-pay | Admitting: *Deleted

## 2014-01-24 MED ORDER — INSULIN LISPRO PROT & LISPRO (75-25 MIX) 100 UNIT/ML ~~LOC~~ SUSP: 30.0000 [IU] | Freq: Every day | SUBCUTANEOUS | Status: DC

## 2014-01-27 ENCOUNTER — Encounter: Payer: Self-pay | Admitting: Family Medicine

## 2014-03-01 ENCOUNTER — Telehealth: Payer: Self-pay | Admitting: Family Medicine

## 2014-03-01 DIAGNOSIS — H539 Unspecified visual disturbance: Secondary | ICD-10-CM

## 2014-03-01 NOTE — Telephone Encounter (Signed)
Pt is needing a referral to [facility], Dr. Allen Kell, pt states his insurance requires a referral before they will pay for services.

## 2014-03-01 NOTE — Telephone Encounter (Signed)
Referral placed.

## 2014-03-08 ENCOUNTER — Telehealth: Payer: Self-pay | Admitting: Family Medicine

## 2014-03-08 MED ORDER — ACCU-CHEK SMARTVIEW CONTROL VI LIQD: 1.0000 | Status: DC

## 2014-03-08 MED ORDER — ACCU-CHEK FASTCLIX LANCETS MISC: Status: DC

## 2014-03-08 MED ORDER — BD SWAB SINGLE USE REGULAR PADS: MEDICATED_PAD | Status: DC

## 2014-03-08 MED ORDER — ACCU-CHEK NANO SMARTVIEW W/DEVICE KIT: 1.0000 | PACK | Freq: Once | Status: DC

## 2014-03-08 MED ORDER — GLUCOSE BLOOD VI STRP: ORAL_STRIP | Status: DC

## 2014-03-08 NOTE — Telephone Encounter (Signed)
[place], [address] is requesting re-fills on the following:   [place] TEST STRIP ACCU-CHEK FASTCLIX LANCETS ACCU-CHEK SMARTVIEW CONTRL SOL BD SINGLE USE SWAB

## 2014-03-24 ENCOUNTER — Other Ambulatory Visit (INDEPENDENT_AMBULATORY_CARE_PROVIDER_SITE_OTHER): Payer: Medicare PPO

## 2014-03-24 DIAGNOSIS — E108 Type 1 diabetes mellitus with unspecified complications: Secondary | ICD-10-CM

## 2014-03-24 DIAGNOSIS — IMO0002 Reserved for concepts with insufficient information to code with codable children: Secondary | ICD-10-CM

## 2014-03-24 DIAGNOSIS — E785 Hyperlipidemia, unspecified: Secondary | ICD-10-CM

## 2014-03-24 DIAGNOSIS — E1065 Type 1 diabetes mellitus with hyperglycemia: Secondary | ICD-10-CM

## 2014-03-24 LAB — BASIC METABOLIC PANEL
BUN: 16 mg/dL (ref 6–23)
CALCIUM: 9.3 mg/dL (ref 8.4–10.5)
CHLORIDE: 103 meq/L (ref 96–112)
CO2: 31 mEq/L (ref 19–32)
CREATININE: 1.1 mg/dL (ref 0.4–1.5)
GFR: 84.17 mL/min (ref >60.00)
Glucose, Bld: 100 mg/dL — ABNORMAL HIGH (ref 70–99)
Potassium: 3.4 mEq/L — ABNORMAL LOW (ref 3.5–5.1)
Sodium: 139 mEq/L (ref 135–145)

## 2014-03-24 LAB — HEMOGLOBIN A1C: Hgb A1c MFr Bld: 7.2 % — ABNORMAL HIGH (ref 4.6–6.5)

## 2014-03-30 ENCOUNTER — Ambulatory Visit: Payer: Medicare PPO | Admitting: Family Medicine

## 2014-03-31 ENCOUNTER — Encounter: Payer: Self-pay | Admitting: Family Medicine

## 2014-03-31 ENCOUNTER — Ambulatory Visit (INDEPENDENT_AMBULATORY_CARE_PROVIDER_SITE_OTHER): Payer: Medicare PPO | Admitting: Family Medicine

## 2014-03-31 VITALS — BP 120/80 | Temp 97.8°F | Wt 209.0 lb

## 2014-03-31 DIAGNOSIS — E108 Type 1 diabetes mellitus with unspecified complications: Principal | ICD-10-CM

## 2014-03-31 DIAGNOSIS — E1065 Type 1 diabetes mellitus with hyperglycemia: Secondary | ICD-10-CM

## 2014-03-31 DIAGNOSIS — IMO0002 Reserved for concepts with insufficient information to code with codable children: Secondary | ICD-10-CM

## 2014-03-31 MED ORDER — INSULIN PEN NEEDLE 32G X 4 MM MISC: 1.0000 | Freq: Every day | Status: DC

## 2014-03-31 NOTE — Patient Instructions (Signed)
Continue your current medications diet and exercise program  Check a fasting blood sugar Monday Wednesday Friday  Return the first full week in January 2016 for your annual physical examination,,,,,,,,,, sooner if you see your blood sugar creep up

## 2014-03-31 NOTE — Progress Notes (Signed)
Pre visit review using our clinic review tool, if applicable. No additional management support is needed unless otherwise documented below in the visit note. 

## 2014-03-31 NOTE — Progress Notes (Signed)
   Subjective:    Patient ID: Christopher [name], male    DOB: 08-Jun-1946, 68 y.o.   MRN: [id number]  HPI Christopher [name] is a 68 year old married male nonsmoker who comes in today for followup of diabetes type 1  He currently takes long-acting insulin 30 units at bedtime along with metformin 1000 mg before breakfast and 500 mg before his evening meal. His A1c in January we did his physical exam was 8.0%. Since that time she's been compliant with his diet and exercise A1c is dropped to 7.2%. No hypoglycemia   Review of Systems No hypoglycemia    Objective:   Physical Exam  Well-developed well-nourished male no acute distress vital signs stable he is afebrile      Assessment & Plan:  Diabetes type 1 at goal continue current therapy followup and physical exam January 2016

## 2014-05-10 ENCOUNTER — Telehealth: Payer: Self-pay | Admitting: Family Medicine

## 2014-05-10 DIAGNOSIS — H409 Unspecified glaucoma: Secondary | ICD-10-CM

## 2014-05-10 NOTE — Telephone Encounter (Signed)
Pt states Dr. Allen Kell, his opthalmologist is requesting a referral from PCP for treatment of glaucoma, pt has appt scheduled July 13th.  Please fax referral order to [phone].

## 2014-06-27 LAB — HM DIABETES EYE EXAM

## 2014-07-01 ENCOUNTER — Encounter: Payer: Self-pay | Admitting: Family Medicine

## 2014-07-04 ENCOUNTER — Telehealth: Payer: Self-pay | Admitting: *Deleted

## 2014-07-04 DIAGNOSIS — IMO0002 Reserved for concepts with insufficient information to code with codable children: Secondary | ICD-10-CM

## 2014-07-04 DIAGNOSIS — E108 Type 1 diabetes mellitus with unspecified complications: Principal | ICD-10-CM

## 2014-07-04 DIAGNOSIS — E785 Hyperlipidemia, unspecified: Secondary | ICD-10-CM

## 2014-07-04 DIAGNOSIS — E1065 Type 1 diabetes mellitus with hyperglycemia: Secondary | ICD-10-CM

## 2014-07-04 NOTE — Telephone Encounter (Signed)
Left message on machine for patient  Lipid panel ordered Diabetic bundle

## 2014-07-05 NOTE — Telephone Encounter (Signed)
Pt  Has been sch

## 2014-08-02 ENCOUNTER — Other Ambulatory Visit: Payer: Medicare PPO

## 2014-08-08 ENCOUNTER — Other Ambulatory Visit (INDEPENDENT_AMBULATORY_CARE_PROVIDER_SITE_OTHER): Payer: Medicare PPO

## 2014-08-08 DIAGNOSIS — E785 Hyperlipidemia, unspecified: Secondary | ICD-10-CM

## 2014-08-08 LAB — LIPID PANEL
CHOL/HDL RATIO: 3
Cholesterol: 144 mg/dL (ref 0–200)
HDL: 43.1 mg/dL (ref >39.00)
LDL Cholesterol: 90 mg/dL (ref 0–99)
NONHDL: 100.9
Triglycerides: 53 mg/dL (ref 0.0–149.0)
VLDL: 10.6 mg/dL (ref 0.0–40.0)

## 2014-12-12 ENCOUNTER — Other Ambulatory Visit: Payer: Medicare PPO | Admitting: Family Medicine

## 2014-12-12 ENCOUNTER — Other Ambulatory Visit (INDEPENDENT_AMBULATORY_CARE_PROVIDER_SITE_OTHER): Payer: Medicare PPO

## 2014-12-12 DIAGNOSIS — Z23 Encounter for immunization: Secondary | ICD-10-CM

## 2014-12-12 DIAGNOSIS — E1065 Type 1 diabetes mellitus with hyperglycemia: Secondary | ICD-10-CM

## 2014-12-12 DIAGNOSIS — E108 Type 1 diabetes mellitus with unspecified complications: Secondary | ICD-10-CM

## 2014-12-12 DIAGNOSIS — IMO0002 Reserved for concepts with insufficient information to code with codable children: Secondary | ICD-10-CM

## 2014-12-12 DIAGNOSIS — E1069 Type 1 diabetes mellitus with other specified complication: Secondary | ICD-10-CM

## 2014-12-12 LAB — LIPID PANEL
CHOL/HDL RATIO: 3
Cholesterol: 159 mg/dL (ref 0–200)
HDL: 48 mg/dL (ref >39.00)
LDL CALC: 102 mg/dL — AB (ref 0–99)
NONHDL: 111
Triglycerides: 44 mg/dL (ref 0.0–149.0)
VLDL: 8.8 mg/dL (ref 0.0–40.0)

## 2014-12-12 LAB — BASIC METABOLIC PANEL
BUN: 18 mg/dL (ref 6–23)
CO2: 30 mEq/L (ref 19–32)
Calcium: 9.4 mg/dL (ref 8.4–10.5)
Chloride: 106 mEq/L (ref 96–112)
Creatinine, Ser: 1.1 mg/dL (ref 0.4–1.5)
GFR: 85.75 mL/min (ref >60.00)
Glucose, Bld: 111 mg/dL — ABNORMAL HIGH (ref 70–99)
POTASSIUM: 3.7 meq/L (ref 3.5–5.1)
Sodium: 141 mEq/L (ref 135–145)

## 2014-12-12 LAB — CBC WITH DIFFERENTIAL/PLATELET
Basophils Absolute: 0 10*3/uL (ref 0.0–0.1)
Basophils Relative: 0.7 % (ref 0.0–3.0)
EOS ABS: 0.3 10*3/uL (ref 0.0–0.7)
EOS PCT: 8.5 % — AB (ref 0.0–5.0)
HEMATOCRIT: 40.4 % (ref 39.0–52.0)
Hemoglobin: 13.3 g/dL (ref 13.0–17.0)
LYMPHS ABS: 1.6 10*3/uL (ref 0.7–4.0)
Lymphocytes Relative: 38.8 % (ref 12.0–46.0)
MCHC: 32.9 g/dL (ref 30.0–36.0)
MCV: 82.2 fl (ref 78.0–100.0)
MONO ABS: 0.4 10*3/uL (ref 0.1–1.0)
Monocytes Relative: 10.8 % (ref 3.0–12.0)
Neutro Abs: 1.7 10*3/uL (ref 1.4–7.7)
Neutrophils Relative %: 41.2 % — ABNORMAL LOW (ref 43.0–77.0)
PLATELETS: 237 10*3/uL (ref 150.0–400.0)
RBC: 4.91 Mil/uL (ref 4.22–5.81)
RDW: 13.9 % (ref 11.5–15.5)
WBC: 4 10*3/uL (ref 4.0–10.5)

## 2014-12-12 LAB — POCT URINALYSIS DIPSTICK
Bilirubin, UA: NEGATIVE
Ketones, UA: NEGATIVE
Leukocytes, UA: NEGATIVE
NITRITE UA: NEGATIVE
RBC UA: NEGATIVE
SPEC GRAV UA: 1.02
UROBILINOGEN UA: 1
pH, UA: 7

## 2014-12-12 LAB — MICROALBUMIN / CREATININE URINE RATIO
CREATININE, U: 172 mg/dL
MICROALB/CREAT RATIO: 0.2 mg/g (ref 0.0–30.0)
Microalb, Ur: 0.3 mg/dL (ref 0.0–1.9)

## 2014-12-12 LAB — HEPATIC FUNCTION PANEL
ALT: 17 U/L (ref 0–53)
AST: 15 U/L (ref 0–37)
Albumin: 3.9 g/dL (ref 3.5–5.2)
Alkaline Phosphatase: 72 U/L (ref 39–117)
BILIRUBIN TOTAL: 0.7 mg/dL (ref 0.2–1.2)
Bilirubin, Direct: 0.1 mg/dL (ref 0.0–0.3)
Total Protein: 6.2 g/dL (ref 6.0–8.3)

## 2014-12-12 LAB — PSA: PSA: 0.37 ng/mL (ref 0.10–4.00)

## 2014-12-12 LAB — HEMOGLOBIN A1C: HEMOGLOBIN A1C: 9.1 % — AB (ref 4.6–6.5)

## 2014-12-12 LAB — TSH: TSH: 0.73 u[IU]/mL (ref 0.35–4.50)

## 2014-12-12 NOTE — Addendum Note (Signed)
Addended by: Elmer Picker on: 12/12/2014 08:58 AM   Modules accepted: Orders

## 2014-12-17 ENCOUNTER — Other Ambulatory Visit: Payer: Self-pay | Admitting: Family Medicine

## 2014-12-19 ENCOUNTER — Encounter: Payer: Self-pay | Admitting: Family Medicine

## 2014-12-19 ENCOUNTER — Ambulatory Visit (INDEPENDENT_AMBULATORY_CARE_PROVIDER_SITE_OTHER): Payer: Medicare PPO | Admitting: Family Medicine

## 2014-12-19 VITALS — BP 120/80 | Temp 97.7°F | Ht 75.75 in | Wt 203.7 lb

## 2014-12-19 DIAGNOSIS — E139 Other specified diabetes mellitus without complications: Secondary | ICD-10-CM

## 2014-12-19 DIAGNOSIS — IMO0002 Reserved for concepts with insufficient information to code with codable children: Secondary | ICD-10-CM

## 2014-12-19 DIAGNOSIS — R350 Frequency of micturition: Secondary | ICD-10-CM

## 2014-12-19 DIAGNOSIS — E1065 Type 1 diabetes mellitus with hyperglycemia: Secondary | ICD-10-CM

## 2014-12-19 DIAGNOSIS — E1069 Type 1 diabetes mellitus with other specified complication: Secondary | ICD-10-CM

## 2014-12-19 DIAGNOSIS — E108 Type 1 diabetes mellitus with unspecified complications: Secondary | ICD-10-CM

## 2014-12-19 DIAGNOSIS — E785 Hyperlipidemia, unspecified: Secondary | ICD-10-CM

## 2014-12-19 MED ORDER — SIMVASTATIN 40 MG PO TABS: ORAL_TABLET | ORAL | Status: DC

## 2014-12-19 MED ORDER — LISINOPRIL 2.5 MG PO TABS: 2.5000 mg | ORAL_TABLET | Freq: Every day | ORAL | Status: DC

## 2014-12-19 MED ORDER — INSULIN LISPRO PROT & LISPRO (75-25 MIX) 100 UNIT/ML KWIKPEN: 30.0000 [IU] | PEN_INJECTOR | SUBCUTANEOUS | Status: DC

## 2014-12-19 MED ORDER — METFORMIN HCL 1000 MG PO TABS: ORAL_TABLET | ORAL | Status: DC

## 2014-12-19 NOTE — Progress Notes (Signed)
Pre visit review using our clinic review tool, if applicable. No additional management support is needed unless otherwise documented below in the visit note. 

## 2014-12-19 NOTE — Progress Notes (Signed)
   Subjective:    Patient ID: Christopher [name], male    DOB: Nov 17, 1946, 69 y.o.   MRN: [id number]  HPI Christopher [name] is a 69 year old married male nonsmoker who comes in today for evaluation of diabetes type 1, hyperlipidemia,  His diabetes is maintained on Humalog 75-25 mix dose 30 units at bedtime, metformin 500 mg dose 2 in the morning 1 in the evening.  He also takes potassium supplements Zocor 60 mg and aspirin tablet.  He has urinary frequency and nocturia  Last A1c a week ago was 9.1 blood sugar was 111  On lisinopril 2.5 mg daily repeat 120/80 the lisinopril is more for renal protection and hypertension.  He gets routine eye care........ has underlying glaucoma......... dental care, colonoscopy and GI was reported to be normal does not recall the date. Vaccinations updated by Apolonio Schneiders  Cognitive function normal he walks or works out at [employer] 5-7 days per week, home health safety reviewed no issues identified, no guns in the house, he does not have a healthcare power of attorney nor living well   Review of Systems  Constitutional: Negative.   HENT: Negative.   Eyes: Negative.   Respiratory: Negative.   Cardiovascular: Negative.   Gastrointestinal: Negative.   Endocrine: Negative.   Genitourinary: Negative.   Musculoskeletal: Negative.   Skin: Negative.   Allergic/Immunologic: Negative.   Neurological: Negative.   Hematological: Negative.   Psychiatric/Behavioral: Negative.        Objective:   Physical Exam  Constitutional: He is oriented to person, place, and time. He appears well-developed and well-nourished.  HENT:  Head: Normocephalic and atraumatic.  Right Ear: External ear normal.  Left Ear: External ear normal.  Nose: Nose normal.  Mouth/Throat: Oropharynx is clear and moist.  Eyes: Conjunctivae and EOM are normal. Pupils are equal, round, and reactive to light.  Neck: Normal range of motion. Neck supple. No JVD present. No tracheal deviation present. No  thyromegaly present.  Cardiovascular: Normal rate, regular rhythm, normal heart sounds and intact distal pulses.  Exam reveals no gallop and no friction rub.   No murmur heard. No carotid nor aortic bruits peripheral pulses 1+ and symmetrical  Pulmonary/Chest: Effort normal and breath sounds normal. No stridor. No respiratory distress. He has no wheezes. He has no rales. He exhibits no tenderness.  Abdominal: Soft. Bowel sounds are normal. He exhibits no distension and no mass. There is no tenderness. There is no rebound and no guarding.  Genitourinary: Rectum normal and penis normal. Guaiac negative stool. No penile tenderness.  1+ symmetrical nonnodular BPH  Musculoskeletal: Normal range of motion. He exhibits no edema or tenderness.  Lymphadenopathy:    He has no cervical adenopathy.  Neurological: He is alert and oriented to person, place, and time. He has normal reflexes. No cranial nerve deficit. He exhibits normal muscle tone.  Skin: Skin is warm and dry. No rash noted. No erythema. No pallor.  Psychiatric: He has a normal mood and affect. His behavior is normal. Judgment and thought content normal.  Nursing note and vitals reviewed.         Assessment & Plan:  Diabetes type 1,,,,,,,,, A1c elevated but fasting blood sugar normal,,,,,,,,, will discuss various options  Hyperlipidemia goal continue current therapy

## 2014-12-19 NOTE — Patient Instructions (Signed)
Metformin 1000 mg........... one twice daily  Continue your current dose of insulin  Follow-up in 2 months  Labs one week prior

## 2014-12-27 ENCOUNTER — Telehealth: Payer: Self-pay | Admitting: Family Medicine

## 2014-12-27 DIAGNOSIS — E785 Hyperlipidemia, unspecified: Secondary | ICD-10-CM

## 2014-12-27 MED ORDER — SIMVASTATIN 40 MG PO TABS: ORAL_TABLET | ORAL | Status: DC

## 2014-12-27 NOTE — Telephone Encounter (Signed)
rx sent

## 2014-12-27 NOTE — Telephone Encounter (Signed)
PA for Simvastatin was Approved.  Can you please re-send the rx for the quantity of 135 per 90 days. Thanks

## 2015-02-13 ENCOUNTER — Other Ambulatory Visit (INDEPENDENT_AMBULATORY_CARE_PROVIDER_SITE_OTHER): Payer: Medicare PPO

## 2015-02-13 DIAGNOSIS — IMO0002 Reserved for concepts with insufficient information to code with codable children: Secondary | ICD-10-CM

## 2015-02-13 DIAGNOSIS — E1069 Type 1 diabetes mellitus with other specified complication: Secondary | ICD-10-CM

## 2015-02-13 DIAGNOSIS — E108 Type 1 diabetes mellitus with unspecified complications: Principal | ICD-10-CM

## 2015-02-13 DIAGNOSIS — E1065 Type 1 diabetes mellitus with hyperglycemia: Secondary | ICD-10-CM

## 2015-02-13 LAB — BASIC METABOLIC PANEL
BUN: 17 mg/dL (ref 6–23)
CALCIUM: 9.4 mg/dL (ref 8.4–10.5)
CHLORIDE: 103 meq/L (ref 96–112)
CO2: 29 mEq/L (ref 19–32)
Creatinine, Ser: 1.2 mg/dL (ref 0.40–1.50)
GFR: 77.52 mL/min (ref >60.00)
Glucose, Bld: 116 mg/dL — ABNORMAL HIGH (ref 70–99)
Potassium: 3.6 mEq/L (ref 3.5–5.1)
Sodium: 137 mEq/L (ref 135–145)

## 2015-02-13 LAB — HEMOGLOBIN A1C: Hgb A1c MFr Bld: 8.4 % — ABNORMAL HIGH (ref 4.6–6.5)

## 2015-02-20 ENCOUNTER — Ambulatory Visit (INDEPENDENT_AMBULATORY_CARE_PROVIDER_SITE_OTHER): Payer: Medicare PPO | Admitting: Family Medicine

## 2015-02-20 ENCOUNTER — Encounter: Payer: Self-pay | Admitting: Family Medicine

## 2015-02-20 VITALS — BP 118/80 | HR 62 | Temp 98.0°F | Ht 75.75 in | Wt 204.0 lb

## 2015-02-20 DIAGNOSIS — E1065 Type 1 diabetes mellitus with hyperglycemia: Secondary | ICD-10-CM

## 2015-02-20 DIAGNOSIS — E1069 Type 1 diabetes mellitus with other specified complication: Secondary | ICD-10-CM

## 2015-02-20 DIAGNOSIS — IMO0002 Reserved for concepts with insufficient information to code with codable children: Secondary | ICD-10-CM

## 2015-02-20 DIAGNOSIS — E108 Type 1 diabetes mellitus with unspecified complications: Principal | ICD-10-CM

## 2015-02-20 MED ORDER — METFORMIN HCL 1000 MG PO TABS: ORAL_TABLET | ORAL | Status: DC

## 2015-02-20 NOTE — Patient Instructions (Signed)
Metformin 1000 mg,,,,,,,, one twice daily  Continue your current dose of insulin  Exercise daily  Tighten up on your diet  Follow-up in May,,,,,,,, nonfasting labs one week prior

## 2015-02-20 NOTE — Progress Notes (Signed)
   Subjective:    Patient ID: Christopher [name], male    DOB: Oct 27, 1946, 69 y.o.   MRN: [id number]  HPI Christopher [name] is a 69 year old male who comes in today for follow-up of diabetes  He is currently taking insulin long-acting 30 units at bedtime along with metformin 1000 mg the morning and 5 mg in the evening meal. His A1c is dropped from 9.1-8.4 and 2 months. He's walking 4 miles a day and has been working hard on his diet.   Review of Systems Review of systems otherwise negative    Objective:   Physical Exam Well-developed well-nourished male no acute distress vital signs stable he is afebrile BP normal 118/80 on lisinopril 2.5 mg daily and hydrochlorothiazide 25 mg daily       Assessment & Plan:  Diabetes type 1 not at goal,,,,,,, stressed continue daily exercise,,, tightness up on diet,,,,,,,, metformin. 1000 mg twice a day  Keep your insulin the same because fasting blood sugars 116 and follow-up in May

## 2015-02-20 NOTE — Progress Notes (Signed)
Pre visit review using our clinic review tool, if applicable. No additional management support is needed unless otherwise documented below in the visit note. 

## 2015-03-31 ENCOUNTER — Other Ambulatory Visit: Payer: Self-pay | Admitting: Family Medicine

## 2015-04-12 ENCOUNTER — Other Ambulatory Visit (INDEPENDENT_AMBULATORY_CARE_PROVIDER_SITE_OTHER): Payer: Medicare PPO

## 2015-04-12 DIAGNOSIS — IMO0002 Reserved for concepts with insufficient information to code with codable children: Secondary | ICD-10-CM

## 2015-04-12 DIAGNOSIS — E1065 Type 1 diabetes mellitus with hyperglycemia: Secondary | ICD-10-CM

## 2015-04-12 DIAGNOSIS — E1069 Type 1 diabetes mellitus with other specified complication: Secondary | ICD-10-CM | POA: Diagnosis not present

## 2015-04-12 DIAGNOSIS — E108 Type 1 diabetes mellitus with unspecified complications: Principal | ICD-10-CM

## 2015-04-12 LAB — BASIC METABOLIC PANEL
BUN: 15 mg/dL (ref 6–23)
CHLORIDE: 100 meq/L (ref 96–112)
CO2: 28 mEq/L (ref 19–32)
Calcium: 9.3 mg/dL (ref 8.4–10.5)
Creatinine, Ser: 1.62 mg/dL — ABNORMAL HIGH (ref 0.40–1.50)
GFR: 54.8 mL/min — ABNORMAL LOW (ref >60.00)
GLUCOSE: 248 mg/dL — AB (ref 70–99)
POTASSIUM: 3.6 meq/L (ref 3.5–5.1)
Sodium: 134 mEq/L — ABNORMAL LOW (ref 135–145)

## 2015-04-12 LAB — HEMOGLOBIN A1C: Hgb A1c MFr Bld: 8.3 % — ABNORMAL HIGH (ref 4.6–6.5)

## 2015-04-17 ENCOUNTER — Other Ambulatory Visit: Payer: Medicare PPO

## 2015-04-18 ENCOUNTER — Ambulatory Visit (INDEPENDENT_AMBULATORY_CARE_PROVIDER_SITE_OTHER): Payer: Medicare PPO | Admitting: Family Medicine

## 2015-04-18 ENCOUNTER — Other Ambulatory Visit: Payer: Self-pay | Admitting: Family Medicine

## 2015-04-18 ENCOUNTER — Encounter: Payer: Self-pay | Admitting: Family Medicine

## 2015-04-18 VITALS — BP 100/70 | Temp 98.1°F | Wt 202.0 lb

## 2015-04-18 DIAGNOSIS — IMO0002 Reserved for concepts with insufficient information to code with codable children: Secondary | ICD-10-CM

## 2015-04-18 DIAGNOSIS — E1069 Type 1 diabetes mellitus with other specified complication: Secondary | ICD-10-CM

## 2015-04-18 DIAGNOSIS — E108 Type 1 diabetes mellitus with unspecified complications: Principal | ICD-10-CM

## 2015-04-18 DIAGNOSIS — H539 Unspecified visual disturbance: Secondary | ICD-10-CM

## 2015-04-18 DIAGNOSIS — L247 Irritant contact dermatitis due to plants, except food: Secondary | ICD-10-CM | POA: Diagnosis not present

## 2015-04-18 DIAGNOSIS — E1065 Type 1 diabetes mellitus with hyperglycemia: Secondary | ICD-10-CM

## 2015-04-18 MED ORDER — PREDNISONE 20 MG PO TABS: ORAL_TABLET | ORAL | Status: DC

## 2015-04-18 NOTE — Progress Notes (Signed)
   Subjective:    Patient ID: Christopher [name], male    DOB: 03-05-1946, 69 y.o.   MRN: [id number]  HPI Christopher [name] is a 69 year old married male nonsmoker who comes in today for evaluation of diabetes and a new skin rash  He's on metformin 1000 mg twice a day and insulin dose 30 units of the 75-25 long-acting insulin. He says his blood sugars are around 100 and the morning actually his blood sugar is 248 his A1c is up to 8.3%. He has a nonfunctioning and accurate meter. He was given a new meter.  His blood pressure today is 110/60 right arm sitting position. He's been on lisinopril 2.5 mg daily for renal protection. He says he is not lightheaded when he stands up  He also developed a skin rash 2 weeks ago when he was out in the woods in [place]. He's been using topical over-the-counter's and it's not getting better indeed is getting worse. Involves his abdomen chest both arms very pleuritic   Review of Systems    review of systems otherwise negative Objective:   Physical Exam  Well-developed well-nourished male no acute distress vital signs stable he's afebrile EP right arm sitting position 110/70 pulse 70 and regular  Skin exam shows extensive contact dermatitis abdomen chest and both arms.      Assessment & Plan:  Diabetes type 1 not at goal......... increase insulin by 5 units every week until blood sugar drops to normal follow-up A1c in 3 months  Contact dermatitis extensive unresponsive to topical medication.........Marland Kitchen prednisone burst and taper

## 2015-04-18 NOTE — Progress Notes (Signed)
Pre visit review using our clinic review tool, if applicable. No additional management support is needed unless otherwise documented below in the visit note. 

## 2015-04-18 NOTE — Patient Instructions (Signed)
Prednisone 20 mg.......... one tablet daily to 5 days....... half a tab for 5 days....... then a half a tab Monday Wednesday Friday for a two-week taper  Increase your insulin by 5 units every week and to your fasting blood sugar drops from 100 to 1:30 range............ 35 units nightly starting tonight....... if in one week your blood sugars not normal increase your insulin to 40 units etc.  The prednisone with temporally increase your blood sugar......... so monitor your sugar carefully  Follow-up in 3 months.  Nonfasting labs one week prior  Continue your other medications

## 2015-04-24 ENCOUNTER — Ambulatory Visit: Payer: Medicare PPO | Admitting: Family Medicine

## 2015-07-24 LAB — HM DIABETES EYE EXAM

## 2015-07-27 ENCOUNTER — Encounter: Payer: Self-pay | Admitting: Family Medicine

## 2015-08-15 ENCOUNTER — Other Ambulatory Visit (INDEPENDENT_AMBULATORY_CARE_PROVIDER_SITE_OTHER): Payer: Medicare PPO

## 2015-08-15 DIAGNOSIS — I10 Essential (primary) hypertension: Secondary | ICD-10-CM | POA: Diagnosis not present

## 2015-08-15 DIAGNOSIS — E119 Type 2 diabetes mellitus without complications: Secondary | ICD-10-CM | POA: Diagnosis not present

## 2015-08-15 LAB — BASIC METABOLIC PANEL
BUN: 17 mg/dL (ref 6–23)
CALCIUM: 9.2 mg/dL (ref 8.4–10.5)
CO2: 30 meq/L (ref 19–32)
Chloride: 104 mEq/L (ref 96–112)
Creatinine, Ser: 1.14 mg/dL (ref 0.40–1.50)
GFR: 82.12 mL/min (ref >60.00)
Glucose, Bld: 71 mg/dL (ref 70–99)
POTASSIUM: 3.5 meq/L (ref 3.5–5.1)
SODIUM: 141 meq/L (ref 135–145)

## 2015-08-15 LAB — HEMOGLOBIN A1C: Hgb A1c MFr Bld: 7.5 % — ABNORMAL HIGH (ref 4.6–6.5)

## 2015-08-22 ENCOUNTER — Ambulatory Visit (INDEPENDENT_AMBULATORY_CARE_PROVIDER_SITE_OTHER): Payer: Medicare PPO | Admitting: Family Medicine

## 2015-08-22 ENCOUNTER — Encounter: Payer: Self-pay | Admitting: Family Medicine

## 2015-08-22 VITALS — BP 110/80 | Temp 98.2°F | Wt 197.0 lb

## 2015-08-22 DIAGNOSIS — E108 Type 1 diabetes mellitus with unspecified complications: Principal | ICD-10-CM

## 2015-08-22 DIAGNOSIS — E1069 Type 1 diabetes mellitus with other specified complication: Secondary | ICD-10-CM | POA: Diagnosis not present

## 2015-08-22 DIAGNOSIS — IMO0002 Reserved for concepts with insufficient information to code with codable children: Secondary | ICD-10-CM

## 2015-08-22 DIAGNOSIS — E1065 Type 1 diabetes mellitus with hyperglycemia: Secondary | ICD-10-CM

## 2015-08-22 DIAGNOSIS — Z23 Encounter for immunization: Secondary | ICD-10-CM | POA: Diagnosis not present

## 2015-08-22 NOTE — Patient Instructions (Signed)
Continue your current medications diet and exercise  Since your blood sugars normal continued is to check it once weekly  Follow-up in December for general physical examination  Labs one week prior

## 2015-08-22 NOTE — Progress Notes (Signed)
Pre visit review using our clinic review tool, if applicable. No additional management support is needed unless otherwise documented below in the visit note. Lab Results  Component Value Date   HGBA1C 7.5* 08/15/2015   HGBA1C 8.3* 04/12/2015   HGBA1C 8.4* 02/13/2015   Lab Results  Component Value Date   MICROALBUR 0.3 12/12/2014   LDLCALC 102* 12/12/2014   CREATININE 1.14 08/15/2015

## 2015-08-22 NOTE — Progress Notes (Signed)
   Subjective:    Patient ID: Christopher [name], male    DOB: 12/08/1946, 69 y.o.   MRN: [id number]  HPI Christopher [name] is a 69 year old married male nonsmoker,,,,,,, retired for 2 years,,,,,, who comes in today for follow-up of diabetes type 1  His current medications are insulin mix 75-20 535 units daily, metformin 1000 mg twice a day. His blood sugars at home have been in the 80-100 range. Abundance ago he got up in the morning and felt weak and shaky checked his blood sugar it was 49. 8 something right away and his blood sugar came back up to normal. He's only had that one episode.  Recent labs showed a blood sugar of 71 nonfasting with an A1c of 7.5%. 4 months ago was 8.3%.  His A1c of 7.5% is not at goal however with his blood sugar being in the 70 range I would not increase his insulin.  Last physical December 2015     Review of Systems Review of systems otherwise negative except he continues to stay fairly physically active. He mows grass he walks every day his weight is grade 197 BP good 110/80    Objective:   Physical Exam  Well-developed well-nourished thin male no acute distress vital signs stable he is afebrile      Assessment & Plan:  Diabetes type 1 at goal,,,,,, continue current therapy follow-up December physical examination

## 2015-11-13 ENCOUNTER — Other Ambulatory Visit: Payer: Self-pay | Admitting: *Deleted

## 2015-11-13 MED ORDER — GLUCOSE BLOOD VI STRP: ORAL_STRIP | Status: DC

## 2016-01-01 ENCOUNTER — Other Ambulatory Visit (INDEPENDENT_AMBULATORY_CARE_PROVIDER_SITE_OTHER): Payer: Medicare Other

## 2016-01-01 DIAGNOSIS — Z125 Encounter for screening for malignant neoplasm of prostate: Secondary | ICD-10-CM | POA: Diagnosis not present

## 2016-01-01 DIAGNOSIS — Z0000 Encounter for general adult medical examination without abnormal findings: Secondary | ICD-10-CM

## 2016-01-01 LAB — POCT URINALYSIS DIPSTICK
Bilirubin, UA: NEGATIVE
GLUCOSE UA: NEGATIVE
Ketones, UA: NEGATIVE
Leukocytes, UA: NEGATIVE
NITRITE UA: NEGATIVE
PROTEIN UA: NEGATIVE
RBC UA: NEGATIVE
SPEC GRAV UA: 1.02
UROBILINOGEN UA: 0.2
pH, UA: 7

## 2016-01-01 LAB — HEPATIC FUNCTION PANEL
ALT: 14 U/L (ref 0–53)
AST: 16 U/L (ref 0–37)
Albumin: 4 g/dL (ref 3.5–5.2)
Alkaline Phosphatase: 69 U/L (ref 39–117)
BILIRUBIN TOTAL: 0.6 mg/dL (ref 0.2–1.2)
Bilirubin, Direct: 0.1 mg/dL (ref 0.0–0.3)
Total Protein: 6.3 g/dL (ref 6.0–8.3)

## 2016-01-01 LAB — BASIC METABOLIC PANEL
BUN: 15 mg/dL (ref 6–23)
CHLORIDE: 103 meq/L (ref 96–112)
CO2: 27 mEq/L (ref 19–32)
CREATININE: 1.12 mg/dL (ref 0.40–1.50)
Calcium: 9 mg/dL (ref 8.4–10.5)
GFR: 83.72 mL/min (ref >60.00)
GLUCOSE: 85 mg/dL (ref 70–99)
POTASSIUM: 3.6 meq/L (ref 3.5–5.1)
Sodium: 141 mEq/L (ref 135–145)

## 2016-01-01 LAB — CBC WITH DIFFERENTIAL/PLATELET
BASOS PCT: 0.8 % (ref 0.0–3.0)
Basophils Absolute: 0 10*3/uL (ref 0.0–0.1)
EOS ABS: 0.2 10*3/uL (ref 0.0–0.7)
EOS PCT: 5.7 % — AB (ref 0.0–5.0)
HCT: 39.7 % (ref 39.0–52.0)
Hemoglobin: 13.1 g/dL (ref 13.0–17.0)
LYMPHS ABS: 1.6 10*3/uL (ref 0.7–4.0)
Lymphocytes Relative: 37.8 % (ref 12.0–46.0)
MCHC: 33.1 g/dL (ref 30.0–36.0)
MCV: 80.4 fl (ref 78.0–100.0)
MONO ABS: 0.5 10*3/uL (ref 0.1–1.0)
Monocytes Relative: 11.8 % (ref 3.0–12.0)
NEUTROS PCT: 43.9 % (ref 43.0–77.0)
Neutro Abs: 1.8 10*3/uL (ref 1.4–7.7)
Platelets: 261 10*3/uL (ref 150.0–400.0)
RBC: 4.94 Mil/uL (ref 4.22–5.81)
RDW: 14.2 % (ref 11.5–15.5)
WBC: 4.2 10*3/uL (ref 4.0–10.5)

## 2016-01-01 LAB — MICROALBUMIN / CREATININE URINE RATIO
CREATININE, U: 243.2 mg/dL
MICROALB UR: 0.7 mg/dL (ref 0.0–1.9)
Microalb Creat Ratio: 0.3 mg/g (ref 0.0–30.0)

## 2016-01-01 LAB — LIPID PANEL
CHOLESTEROL: 149 mg/dL (ref 0–200)
HDL: 45.3 mg/dL (ref >39.00)
LDL CALC: 93 mg/dL (ref 0–99)
NonHDL: 103.47
TRIGLYCERIDES: 52 mg/dL (ref 0.0–149.0)
Total CHOL/HDL Ratio: 3
VLDL: 10.4 mg/dL (ref 0.0–40.0)

## 2016-01-01 LAB — TSH: TSH: 0.69 u[IU]/mL (ref 0.35–4.50)

## 2016-01-01 LAB — HEMOGLOBIN A1C: Hgb A1c MFr Bld: 9.1 % — ABNORMAL HIGH (ref 4.6–6.5)

## 2016-01-01 LAB — PSA: PSA: 0.38 ng/mL (ref 0.10–4.00)

## 2016-01-08 ENCOUNTER — Encounter: Payer: Self-pay | Admitting: Family Medicine

## 2016-01-08 ENCOUNTER — Ambulatory Visit (INDEPENDENT_AMBULATORY_CARE_PROVIDER_SITE_OTHER): Payer: Medicare Other | Admitting: Family Medicine

## 2016-01-08 VITALS — BP 110/80 | Temp 98.9°F | Ht 75.75 in | Wt 199.0 lb

## 2016-01-08 DIAGNOSIS — Z23 Encounter for immunization: Secondary | ICD-10-CM | POA: Diagnosis not present

## 2016-01-08 DIAGNOSIS — E108 Type 1 diabetes mellitus with unspecified complications: Secondary | ICD-10-CM | POA: Diagnosis not present

## 2016-01-08 DIAGNOSIS — IMO0002 Reserved for concepts with insufficient information to code with codable children: Secondary | ICD-10-CM

## 2016-01-08 DIAGNOSIS — Z0000 Encounter for general adult medical examination without abnormal findings: Secondary | ICD-10-CM | POA: Diagnosis not present

## 2016-01-08 DIAGNOSIS — E1065 Type 1 diabetes mellitus with hyperglycemia: Secondary | ICD-10-CM | POA: Diagnosis not present

## 2016-01-08 DIAGNOSIS — E785 Hyperlipidemia, unspecified: Secondary | ICD-10-CM

## 2016-01-08 MED ORDER — HYDROCHLOROTHIAZIDE 25 MG PO TABS: 25.0000 mg | ORAL_TABLET | Freq: Every day | ORAL | Status: DC

## 2016-01-08 MED ORDER — LISINOPRIL 2.5 MG PO TABS: 2.5000 mg | ORAL_TABLET | Freq: Every day | ORAL | Status: DC

## 2016-01-08 MED ORDER — METFORMIN HCL 1000 MG PO TABS: ORAL_TABLET | ORAL | Status: DC

## 2016-01-08 MED ORDER — INSULIN LISPRO PROT & LISPRO (75-25 MIX) 100 UNIT/ML KWIKPEN: 30.0000 [IU] | PEN_INJECTOR | SUBCUTANEOUS | Status: DC

## 2016-01-08 MED ORDER — SIMVASTATIN 40 MG PO TABS: ORAL_TABLET | ORAL | Status: DC

## 2016-01-08 NOTE — Progress Notes (Signed)
   Subjective:    Patient ID: Christopher [name], male    DOB: 08/31/1946, 70 y.o.   MRN: [id number]  HPI  Christopher [name] is a 70 year old married male nonsmoker retired who comes in today for general physical examination  History of insulin-dependent diabetes on metformin 1000 mg twice a day , insulin 75-25 dose 30 units at bedtime. Fasting blood sugar 80-90 however his A1c is 9.1. He states his weight is been steady divisions normal he only has nocturia 1 he follows a diet and he exercises at the [employer] days a week. The days he does not exercise he walks about 3 miles. He drinks about 40+ ounces of water daily. I'm curious to know why his A1c is elevated   he takes lisinopril 2.5 mg daily for renal protection  He takes Zocor and aspirin daily for hyperlipidemia lipids are normal with an LDL of 93.    he gets routine eye care, dental care, colonoscopy 9 years ago was normal  Vaccinations updated by Apolonio Schneiders   Review of Systems  Constitutional: Negative.   HENT: Negative.   Eyes: Negative.   Respiratory: Negative.   Cardiovascular: Negative.   Gastrointestinal: Negative.   Endocrine: Negative.   Genitourinary: Negative.   Musculoskeletal: Negative.   Skin: Negative.   Allergic/Immunologic: Negative.   Neurological: Negative.   Hematological: Negative.   Psychiatric/Behavioral: Negative.        Objective:   Physical Exam  Constitutional: He is oriented to person, place, and time. He appears well-developed and well-nourished.  HENT:  Head: Normocephalic and atraumatic.  Right Ear: External ear normal.  Left Ear: External ear normal.  Nose: Nose normal.  Mouth/Throat: Oropharynx is clear and moist.  Eyes: Conjunctivae and EOM are normal. Pupils are equal, round, and reactive to light.  Neck: Normal range of motion. Neck supple. No JVD present. No tracheal deviation present. No thyromegaly present.  Cardiovascular: Normal rate, regular rhythm, normal heart sounds and intact distal pulses.   Exam reveals no gallop and no friction rub.   No murmur heard. No carotid neurologic bruits peripheral pulses 1+ and symmetrical  Pulmonary/Chest: Effort normal and breath sounds normal. No stridor. No respiratory distress. He has no wheezes. He has no rales. He exhibits no tenderness.  Abdominal: Soft. Bowel sounds are normal. He exhibits no distension and no mass. There is no tenderness. There is no rebound and no guarding.  Genitourinary: Rectum normal, prostate normal and penis normal. Guaiac negative stool. No penile tenderness.  Musculoskeletal: Normal range of motion. He exhibits no edema or tenderness.  Lymphadenopathy:    He has no cervical adenopathy.  Neurological: He is alert and oriented to person, place, and time. He has normal reflexes. No cranial nerve deficit. He exhibits normal muscle tone.  Skin: Skin is warm and dry. No rash noted. No erythema. No pallor.  Psychiatric: He has a normal mood and affect. His behavior is normal. Judgment and thought content normal.  Nursing note and vitals reviewed.         Assessment & Plan:  Type 1 diabetes........... not at goal.......... despite diet exercise medication compliance....... and a consult  Hyperlipidemia........ continue Lipitor and aspirin

## 2016-01-08 NOTE — Patient Instructions (Signed)
Check a blood sugar in the morning and 1 midafternoon and one prior to bedtime  Take a record of all your blood sugar readings when you see the endocrinologist  Continue current medications for now  Once you in the endocrinologist have worked together to get your A1c back to normal.......... then call here to get established with one of our new folks for long-term care  Tommi Rumps or Almyra Free are 2 new adult nurse practitioner's or Dr. Martinique

## 2016-01-08 NOTE — Progress Notes (Signed)
Pre visit review using our clinic review tool, if applicable. No additional management support is needed unless otherwise documented below in the visit note. 

## 2016-01-30 ENCOUNTER — Ambulatory Visit (INDEPENDENT_AMBULATORY_CARE_PROVIDER_SITE_OTHER): Payer: Medicare Other | Admitting: Endocrinology

## 2016-01-30 ENCOUNTER — Encounter: Payer: Self-pay | Admitting: Endocrinology

## 2016-01-30 VITALS — BP 112/70 | HR 64 | Temp 97.8°F | Ht 75.75 in | Wt 199.0 lb

## 2016-01-30 DIAGNOSIS — E1065 Type 1 diabetes mellitus with hyperglycemia: Secondary | ICD-10-CM | POA: Diagnosis not present

## 2016-01-30 DIAGNOSIS — E108 Type 1 diabetes mellitus with unspecified complications: Secondary | ICD-10-CM | POA: Diagnosis not present

## 2016-01-30 DIAGNOSIS — IMO0002 Reserved for concepts with insufficient information to code with codable children: Secondary | ICD-10-CM

## 2016-01-30 MED ORDER — INSULIN LISPRO 100 UNIT/ML (KWIKPEN): PEN_INJECTOR | SUBCUTANEOUS | Status: DC

## 2016-01-30 NOTE — Patient Instructions (Addendum)
good diet and exercise significantly improve the control of your diabetes.  please let me know if you wish to be referred to a dietician.  high blood sugar is very risky to your health.  you should see an eye doctor and dentist every year.  It is very important to get all recommended vaccinations.  controlling your blood pressure and cholesterol drastically reduces the damage diabetes does to your body.  Those who smoke should quit.  please discuss these with your doctor.  check your blood sugar twice a day.  vary the time of day when you check, between before the 3 meals, and at bedtime.  also check if you have symptoms of your blood sugar being too high or too low.  please keep a record of the readings and bring it to your next appointment here (or you can bring the meter itself).  You can write it on any piece of paper.  please call us sooner if your blood sugar goes below 70, or if you have a lot of readings over 200.  After you run out of your current insulin, change to humalog 3 times a day (just before each meal) 13-8-13 units.  Please come back for a follow-up appointment in 6 weeks.

## 2016-01-30 NOTE — Progress Notes (Signed)
Subjective:    Patient ID: Christopher [name], male    DOB: 12/24/1945, 70 y.o.   MRN: [id number]  HPI pt states DM was dx'ed in 2010; he has mild neuropathy of the lower extremities; he is unaware of any associated chronic complications; he has been on insulin since 2013; pt says his diet and exercise are good; he has never had pancreatitis or DKA.  His last episode of severe hypoglycemia was in early 2016.  He takes premixed insulin at HS.  He says cbg's vary from 90-200's.  It is in general higher as the day goes on.   Past Medical History  Diagnosis Date  . GERD (gastroesophageal reflux disease)   . Hyperlipidemia   . Glaucoma   . Diabetes mellitus type II     Past Surgical History  Procedure Laterality Date  . Patella fracture surgery  1993  . Hernia repair      Social History   Social History  . Marital Status: Married    Spouse Name: N/A  . Number of Children: N/A  . Years of Education: N/A   Occupational History  . Not on file.   Social History Main Topics  . Smoking status: Never Smoker   . Smokeless tobacco: Not on file  . Alcohol Use: No  . Drug Use: No  . Sexual Activity: Not on file   Other Topics Concern  . Not on file   Social History Narrative    Current Outpatient Prescriptions on File Prior to Visit  Medication Sig Dispense Refill  . ACCU-CHEK FASTCLIX LANCETS MISC Test twice daily or as directed by doctor, Dx 250.93 200 each 3  . Alcohol Swabs (B-D SINGLE USE SWABS REGULAR) PADS TEST TWICE DAILY OR AS DIRECTED BY DOCTOR 200 each 3  . ALPHAGAN P 0.1 % SOLN     . aspirin EC 81 MG EC tablet Take 81 mg by mouth daily.      . Blood Glucose Calibration (ACCU-CHEK SMARTVIEW CONTROL) LIQD 1 each by In Vitro route as directed. 1 each 3  . Blood Glucose Monitoring Suppl (ACCU-CHEK NANO SMARTVIEW) W/DEVICE KIT 1 kit by Does not apply route once. 1 kit 0  . dorzolamide-timolol (COSOPT) 22.3-6.8 MG/ML ophthalmic solution     . fish oil-omega-3 fatty acids 1000  MG capsule Take 2 g by mouth daily.      . Flax OIL Take by mouth daily.      Marland Kitchen glucose blood (ACCU-CHEK SMARTVIEW) test strip Test twice daily or as directed by doctor, dx 250.93 200 each 12  . hydrochlorothiazide (HYDRODIURIL) 25 MG tablet Take 1 tablet (25 mg total) by mouth daily. 90 tablet 3  . L-Methylfolate-B6-B12 (METANX) 3-35-2 MG TABS Take by mouth 2 (two) times daily.      Marland Kitchen lisinopril (PRINIVIL,ZESTRIL) 2.5 MG tablet Take 1 tablet (2.5 mg total) by mouth daily. 90 tablet 3  . Multiple Vitamins-Minerals (CENTRUM SILVER ULTRA MENS PO) Take by mouth daily.      . Potassium 99 MG TABS Take by mouth daily.      . predniSONE (DELTASONE) 20 MG tablet Uses directed for contact dermatitis 50 tablet 1  . simvastatin (ZOCOR) 40 MG tablet 1 1/2 tablets nightly 135 tablet 3   No current facility-administered medications on file prior to visit.    No Known Allergies  Family History  Problem Relation Age of Onset  . Alcohol abuse Father   . Diabetes Sister     BP 112/70 mmHg  Pulse 64  Temp(Src) 97.8 F (36.6 C) (Oral)  Ht 6' 3.75" (1.924 m)  Wt 199 lb (90.266 kg)  BMI 24.38 kg/m2  SpO2 97%  Review of Systems denies blurry vision, headache, chest pain, sob, n/v, urinary frequency, muscle cramps, excessive diaphoresis, memory loss, and easy bruising.  He has lost 45 lbs since dx of DM.  He has cold intolerance and rhinorrhea.       Objective:   Physical Exam VS: see vs page GEN: no distress HEAD: head: no deformity eyes: no periorbital swelling, no proptosis external nose and ears are normal mouth: no lesion seen NECK: supple, thyroid is not enlarged CHEST WALL: no deformity LUNGS: clear to auscultation BREASTS:  No gynecomastia CV: reg rate and rhythm, no murmur ABD: abdomen is soft, nontender.  no hepatosplenomegaly.  not distended.  no hernia MUSCULOSKELETAL: muscle bulk and strength are grossly normal.  no obvious joint swelling.  gait is normal and  steady EXTEMITIES: no deformity.  no ulcer on the feet.  feet are of normal color and temp.  no edema PULSES: dorsalis pedis intact bilat.  no carotid bruit NEURO:  cn 2-12 grossly intact.   readily moves all 4's.  sensation is intact to touch on the feet SKIN:  Normal texture and temperature.  No rash or suspicious lesion is visible.   NODES:  None palpable at the neck PSYCH: alert, well-oriented.  Does not appear anxious nor depressed.   Lab Results  Component Value Date   HGBA1C 9.1* 01/01/2016   I have reviewed outside records, and summarized: Pt was noted to have elevated a1c, and referred here.  i personally reviewed electrocardiogram tracing (01/08/16): Indication: dyslipidemia and DM Impression: normal    Assessment & Plan:  DM: he have several factors favoring a dx of type 1.  We discussed rx options, and he agrees to take multiple daily injections.  We'll have to see with time if he also needs basal insulin.    Patient is advised the following: Patient Instructions  good diet and exercise significantly improve the control of your diabetes.  please let me know if you wish to be referred to a dietician.  high blood sugar is very risky to your health.  you should see an eye doctor and dentist every year.  It is very important to get all recommended vaccinations.  controlling your blood pressure and cholesterol drastically reduces the damage diabetes does to your body.  Those who smoke should quit.  please discuss these with your doctor.  check your blood sugar twice a day.  vary the time of day when you check, between before the 3 meals, and at bedtime.  also check if you have symptoms of your blood sugar being too high or too low.  please keep a record of the readings and bring it to your next appointment here (or you can bring the meter itself).  You can write it on any piece of paper.  please call us sooner if your blood sugar goes below 70, or if you have a lot of readings over  200.  After you run out of your current insulin, change to humalog 3 times a day (just before each meal) 13-8-13 units.  Please come back for a follow-up appointment in 6 weeks.

## 2016-02-01 ENCOUNTER — Other Ambulatory Visit: Payer: Self-pay

## 2016-02-01 MED ORDER — INSULIN PEN NEEDLE 32G X 4 MM MISC: Status: DC

## 2016-02-01 MED ORDER — INSULIN LISPRO 100 UNIT/ML (KWIKPEN): PEN_INJECTOR | SUBCUTANEOUS | Status: DC

## 2016-03-12 ENCOUNTER — Ambulatory Visit (INDEPENDENT_AMBULATORY_CARE_PROVIDER_SITE_OTHER): Payer: Medicare Other | Admitting: Endocrinology

## 2016-03-12 ENCOUNTER — Encounter: Payer: Self-pay | Admitting: Endocrinology

## 2016-03-12 VITALS — BP 112/64 | HR 74 | Temp 98.6°F | Ht 75.5 in | Wt 199.0 lb

## 2016-03-12 DIAGNOSIS — IMO0002 Reserved for concepts with insufficient information to code with codable children: Secondary | ICD-10-CM

## 2016-03-12 DIAGNOSIS — E108 Type 1 diabetes mellitus with unspecified complications: Secondary | ICD-10-CM

## 2016-03-12 DIAGNOSIS — E1065 Type 1 diabetes mellitus with hyperglycemia: Secondary | ICD-10-CM | POA: Diagnosis not present

## 2016-03-12 LAB — POCT GLYCOSYLATED HEMOGLOBIN (HGB A1C): HEMOGLOBIN A1C: 8.2

## 2016-03-12 MED ORDER — GLUCAGON (RDNA) 1 MG IJ KIT: 1.0000 mg | PACK | Freq: Once | INTRAMUSCULAR | Status: DC | PRN

## 2016-03-12 NOTE — Patient Instructions (Addendum)
check your blood sugar twice a day.  vary the time of day when you check, between before the 3 meals, and at bedtime.  also check if you have symptoms of your blood sugar being too high or too low.  please keep a record of the readings and bring it to your next appointment here (or you can bring the meter itself).  You can write it on any piece of paper.  please call us sooner if your blood sugar goes below 70, or if you have a lot of readings over 200.  Please continue the same humalog.   i have sent a prescription to your pharmacy, for a medication called "glucagon."  This is a single-use emergency kit.  It will help you when your blood sugar is so low, you need someone else to help you.  The side-effect is nausea, so you should be turned on your side after getting this shot.  Please come back for a follow-up appointment in 2 months.

## 2016-03-12 NOTE — Progress Notes (Signed)
Subjective:    Patient ID: Christopher [name], male    DOB: 10-20-1946, 70 y.o.   MRN: [id number]  HPI  Pt returns for f/u of diabetes mellitus: DM type: Insulin-requiring type 2 Dx'ed: 1660 Complications: polyneuropathy Therapy: insulin since 2013 DKA: never Severe hypoglycemia: last episode was in early 2016 Pancreatitis: never Other: he takes multiple daily injections. Interval history: no cbg record, but states cbg's vary from 90-200's.  It is in general higher as the day goes on.  It is lowest fasting, and with exercise.  He says it is highest at lunch.   Past Medical History  Diagnosis Date  . GERD (gastroesophageal reflux disease)   . Hyperlipidemia   . Glaucoma   . Diabetes mellitus type II     Past Surgical History  Procedure Laterality Date  . Patella fracture surgery  1993  . Hernia repair      Social History   Social History  . Marital Status: Married    Spouse Name: N/A  . Number of Children: N/A  . Years of Education: N/A   Occupational History  . Not on file.   Social History Main Topics  . Smoking status: Never Smoker   . Smokeless tobacco: Not on file  . Alcohol Use: No  . Drug Use: No  . Sexual Activity: Not on file   Other Topics Concern  . Not on file   Social History Narrative    Current Outpatient Prescriptions on File Prior to Visit  Medication Sig Dispense Refill  . ACCU-CHEK FASTCLIX LANCETS MISC Test twice daily or as directed by doctor, Dx 250.93 200 each 3  . Alcohol Swabs (B-D SINGLE USE SWABS REGULAR) PADS TEST TWICE DAILY OR AS DIRECTED BY DOCTOR 200 each 3  . ALPHAGAN P 0.1 % SOLN     . aspirin EC 81 MG EC tablet Take 81 mg by mouth daily.      . Blood Glucose Calibration (ACCU-CHEK SMARTVIEW CONTROL) LIQD 1 each by In Vitro route as directed. 1 each 3  . Blood Glucose Monitoring Suppl (ACCU-CHEK NANO SMARTVIEW) W/DEVICE KIT 1 kit by Does not apply route once. 1 kit 0  . dorzolamide-timolol (COSOPT) 22.3-6.8 MG/ML ophthalmic  solution     . fish oil-omega-3 fatty acids 1000 MG capsule Take 2 g by mouth daily.      . Flax OIL Take by mouth daily.      Marland Kitchen glucose blood (ACCU-CHEK SMARTVIEW) test strip Test twice daily or as directed by doctor, dx 250.93 200 each 12  . hydrochlorothiazide (HYDRODIURIL) 25 MG tablet Take 1 tablet (25 mg total) by mouth daily. 90 tablet 3  . insulin lispro (HUMALOG KWIKPEN) 100 UNIT/ML KiwkPen 3 times a day (just before each meal) 13-8-13 units. 45 mL 3  . Insulin Pen Needle (BD PEN NEEDLE NANO U/F) 32G X 4 MM MISC Use to inject insulin 3 times per day. 300 each 2  . L-Methylfolate-B6-B12 (METANX) 3-35-2 MG TABS Take by mouth 2 (two) times daily.      Marland Kitchen lisinopril (PRINIVIL,ZESTRIL) 2.5 MG tablet Take 1 tablet (2.5 mg total) by mouth daily. 90 tablet 3  . Multiple Vitamins-Minerals (CENTRUM SILVER ULTRA MENS PO) Take by mouth daily.      . Potassium 99 MG TABS Take by mouth daily.      . predniSONE (DELTASONE) 20 MG tablet Uses directed for contact dermatitis 50 tablet 1  . simvastatin (ZOCOR) 40 MG tablet 1 1/2 tablets nightly 135 tablet  3   No current facility-administered medications on file prior to visit.    No Known Allergies  Family History  Problem Relation Age of Onset  . Alcohol abuse Father   . Diabetes Sister     BP 112/64 mmHg  Pulse 74  Temp(Src) 98.6 F (37 C) (Oral)  Ht 6' 3.5" (1.918 m)  Wt 199 lb (90.266 kg)  BMI 24.54 kg/m2  SpO2 98%  Review of Systems Denies LOC.      Objective:   Physical Exam VITAL SIGNS:  See vs page GENERAL: no distress Pulses: dorsalis pedis intact bilat.   MSK: no deformity of the feet CV: no leg edema Skin:  no ulcer on the feet.  normal color and temp on the feet. Neuro: sensation is intact to touch on the feet    A1c=8.2%    Assessment & Plan:  DM: control is much better.    Patient is advised the following: Patient Instructions  check your blood sugar twice a day.  vary the time of day when you check, between  before the 3 meals, and at bedtime.  also check if you have symptoms of your blood sugar being too high or too low.  please keep a record of the readings and bring it to your next appointment here (or you can bring the meter itself).  You can write it on any piece of paper.  please call us sooner if your blood sugar goes below 70, or if you have a lot of readings over 200.  Please continue the same humalog.   i have sent a prescription to your pharmacy, for a medication called "glucagon."  This is a single-use emergency kit.  It will help you when your blood sugar is so low, you need someone else to help you.  The side-effect is nausea, so you should be turned on your side after getting this shot.  Please come back for a follow-up appointment in 2 months.

## 2016-04-08 ENCOUNTER — Telehealth: Payer: Self-pay | Admitting: Endocrinology

## 2016-04-08 NOTE — Telephone Encounter (Signed)
PT said that his pharmacy told him that there is a new meter, test strips, lancets out for his insurance that is a $0 copay and he did not know the exact name but wanted to know if there was a way to find out and possibly change him to that specific brand.  He uses [facility]

## 2016-04-09 ENCOUNTER — Other Ambulatory Visit: Payer: Self-pay

## 2016-04-09 ENCOUNTER — Telehealth: Payer: Self-pay | Admitting: Family Medicine

## 2016-04-09 NOTE — Telephone Encounter (Signed)
I contacted the pt and left a vm advising him to contact his pharmacy or his insurance to determine the meter, lancets and strips we need to send for him. Requested a call back to verify which brand to send.

## 2016-04-09 NOTE — Telephone Encounter (Signed)
Pt's new insurance company will not provide his current test strips at no charge as the previous insurance did. Advised pt to call them back and find out what type of meter he will need in order to get his test strips at no charge and we will assist in  getting pt a new meter.

## 2016-04-10 NOTE — Telephone Encounter (Signed)
I contacted the pt and left a vm requesting a call back to discuss his meter and supplies.

## 2016-04-10 NOTE — Telephone Encounter (Signed)
[facility] is sending him one with test strips but he doesn't know the name so when he gets it he will call to let us know what it is

## 2016-04-10 NOTE — Telephone Encounter (Signed)
Noted  

## 2016-04-12 NOTE — Telephone Encounter (Signed)
Pt states his insurance his is going to send a new meter.

## 2016-04-16 ENCOUNTER — Telehealth: Payer: Self-pay | Admitting: Endocrinology

## 2016-04-16 MED ORDER — GLUCOSE BLOOD VI STRP: ORAL_STRIP | Status: DC

## 2016-04-16 NOTE — Telephone Encounter (Signed)
PT came by and brought his new meter, he has the One Touch Ultra 2.  He said he would need the test strips for that sent into the [facility]

## 2016-04-16 NOTE — Telephone Encounter (Signed)
Rx submitted per pt's request.  

## 2016-05-17 ENCOUNTER — Ambulatory Visit (INDEPENDENT_AMBULATORY_CARE_PROVIDER_SITE_OTHER): Payer: Medicare Other | Admitting: Endocrinology

## 2016-05-17 ENCOUNTER — Other Ambulatory Visit: Payer: Self-pay

## 2016-05-17 ENCOUNTER — Encounter: Payer: Self-pay | Admitting: Endocrinology

## 2016-05-17 VITALS — BP 112/60 | HR 62 | Temp 98.1°F | Ht 76.0 in | Wt 199.0 lb

## 2016-05-17 DIAGNOSIS — IMO0002 Reserved for concepts with insufficient information to code with codable children: Secondary | ICD-10-CM

## 2016-05-17 DIAGNOSIS — E108 Type 1 diabetes mellitus with unspecified complications: Secondary | ICD-10-CM

## 2016-05-17 DIAGNOSIS — E1065 Type 1 diabetes mellitus with hyperglycemia: Secondary | ICD-10-CM

## 2016-05-17 LAB — POCT GLYCOSYLATED HEMOGLOBIN (HGB A1C): HEMOGLOBIN A1C: 6.9

## 2016-05-17 MED ORDER — INSULIN LISPRO 100 UNIT/ML (KWIKPEN): PEN_INJECTOR | SUBCUTANEOUS | Status: DC

## 2016-05-17 MED ORDER — BD SWAB SINGLE USE REGULAR PADS: MEDICATED_PAD | Status: DC

## 2016-05-17 MED ORDER — ONETOUCH DELICA LANCETS 33G MISC: Status: DC

## 2016-05-17 NOTE — Progress Notes (Signed)
Subjective:    Patient ID: Christopher [name], male    DOB: Apr 20, 1946, 70 y.o.   MRN: [id number]  HPI Pt returns for f/u of diabetes mellitus: DM type: Insulin-requiring type 2 Dx'ed: 1518 Complications: polyneuropathy Therapy: insulin since 2013 DKA: never Severe hypoglycemia: last episode was in early 2016. Pancreatitis: never.  Other: he takes multiple daily injections; the pattern of cbg's says he does not need basal insulin.   Interval history: He has mild hypoglycemia approx once per month.  This happens in the middle of the night, 8 hrs after the last injection.  no cbg record, but states cbg's are higher at other times of day.  He says he never misses the insulin.   Past Medical History  Diagnosis Date  . GERD (gastroesophageal reflux disease)   . Hyperlipidemia   . Glaucoma   . Diabetes mellitus type II     Past Surgical History  Procedure Laterality Date  . Patella fracture surgery  1993  . Hernia repair      Social History   Social History  . Marital Status: Married    Spouse Name: N/A  . Number of Children: N/A  . Years of Education: N/A   Occupational History  . Not on file.   Social History Main Topics  . Smoking status: Never Smoker   . Smokeless tobacco: Not on file  . Alcohol Use: No  . Drug Use: No  . Sexual Activity: Not on file   Other Topics Concern  . Not on file   Social History Narrative    Current Outpatient Prescriptions on File Prior to Visit  Medication Sig Dispense Refill  . ALPHAGAN P 0.1 % SOLN     . aspirin EC 81 MG EC tablet Take 81 mg by mouth daily.      . Blood Glucose Calibration (ACCU-CHEK SMARTVIEW CONTROL) LIQD 1 each by In Vitro route as directed. 1 each 3  . Blood Glucose Monitoring Suppl (ACCU-CHEK NANO SMARTVIEW) W/DEVICE KIT 1 kit by Does not apply route once. 1 kit 0  . dorzolamide-timolol (COSOPT) 22.3-6.8 MG/ML ophthalmic solution     . fish oil-omega-3 fatty acids 1000 MG capsule Take 2 g by mouth daily.        . Flax OIL Take by mouth daily.      Marland Kitchen glucagon 1 MG injection Inject 1 mg into the muscle once as needed. 1 each 12  . glucose blood (ONE TOUCH ULTRA TEST) test strip Use to check blood sugar 2 times daily. 200 each 2  . hydrochlorothiazide (HYDRODIURIL) 25 MG tablet Take 1 tablet (25 mg total) by mouth daily. 90 tablet 3  . Insulin Pen Needle (BD PEN NEEDLE NANO U/F) 32G X 4 MM MISC Use to inject insulin 3 times per day. 300 each 2  . L-Methylfolate-B6-B12 (METANX) 3-35-2 MG TABS Take by mouth 2 (two) times daily.      Marland Kitchen lisinopril (PRINIVIL,ZESTRIL) 2.5 MG tablet Take 1 tablet (2.5 mg total) by mouth daily. 90 tablet 3  . Multiple Vitamins-Minerals (CENTRUM SILVER ULTRA MENS PO) Take by mouth daily.      . Potassium 99 MG TABS Take by mouth daily.      . simvastatin (ZOCOR) 40 MG tablet 1 1/2 tablets nightly 135 tablet 3   No current facility-administered medications on file prior to visit.    No Known Allergies  Family History  Problem Relation Age of Onset  . Alcohol abuse Father   . Diabetes  Sister     BP 112/60 mmHg  Pulse 62  Temp(Src) 98.1 F (36.7 C) (Oral)  Ht 6' 4"  (1.93 m)  Wt 199 lb (90.266 kg)  BMI 24.23 kg/m2  SpO2 98%  Review of Systems He denies LOC.      Objective:   Physical Exam VITAL SIGNS:  See vs page GENERAL: no distress Pulses: dorsalis pedis intact bilat.   MSK: no deformity of the feet CV: no leg edema Skin:  no ulcer on the feet.  normal color and temp on the feet. Neuro: sensation is intact to touch on the feet.    A1c=6.9%    Assessment & Plan:  Insulin-requiring type 2 DM: slightly overcontrolled Hypoglycemia, new: due to long duration of action of insulin.    Patient is advised the following: Patient Instructions  check your blood sugar twice a day.  vary the time of day when you check, between before the 3 meals, and at bedtime.  also check if you have symptoms of your blood sugar being too high or too low.  please keep a record  of the readings and bring it to your next appointment here (or you can bring the meter itself).  You can write it on any piece of paper.  please call us sooner if your blood sugar goes below 70, or if you have a lot of readings over 200.  Please reduce the supper humalog to 12 units.   Please come back for a follow-up appointment in 4 months.     Renato Shin, MD

## 2016-05-17 NOTE — Patient Instructions (Addendum)
check your blood sugar twice a day.  vary the time of day when you check, between before the 3 meals, and at bedtime.  also check if you have symptoms of your blood sugar being too high or too low.  please keep a record of the readings and bring it to your next appointment here (or you can bring the meter itself).  You can write it on any piece of paper.  please call us sooner if your blood sugar goes below 70, or if you have a lot of readings over 200.  Please reduce the supper humalog to 12 units.   Please come back for a follow-up appointment in 4 months.

## 2016-05-29 ENCOUNTER — Ambulatory Visit (INDEPENDENT_AMBULATORY_CARE_PROVIDER_SITE_OTHER): Payer: Medicare Other | Admitting: Family Medicine

## 2016-05-29 ENCOUNTER — Encounter: Payer: Self-pay | Admitting: Family Medicine

## 2016-05-29 VITALS — BP 100/70 | HR 70 | Temp 98.2°F | Ht 76.0 in | Wt 202.0 lb

## 2016-05-29 DIAGNOSIS — M545 Low back pain: Secondary | ICD-10-CM

## 2016-05-29 MED ORDER — METHYLPREDNISOLONE ACETATE 80 MG/ML IJ SUSP
80.0000 mg | Freq: Once | INTRAMUSCULAR | Status: AC
  Administered 2016-05-29: 80 mg via INTRAMUSCULAR

## 2016-05-29 MED ORDER — HYDROCODONE-ACETAMINOPHEN 5-325 MG PO TABS: 1.0000 | ORAL_TABLET | Freq: Four times a day (QID) | ORAL | Status: DC | PRN

## 2016-05-29 NOTE — Patient Instructions (Signed)
Lumbosacral Radiculopathy °Lumbosacral radiculopathy is a condition that involves the spinal nerves and nerve roots in the low back and bottom of the spine. The condition develops when these nerves and nerve roots move out of place or become inflamed and cause symptoms. °CAUSES °This condition may be caused by: °· Pressure from a disk that bulges out of place (herniated disk). A disk is a plate of cartilage that separates bones in the spine. °· Disk degeneration. °· A narrowing of the bones of the lower back (spinal stenosis). °· A tumor. °· An infection. °· An injury that places sudden pressure on the disks that cushion the bones of your lower spine. °RISK FACTORS °This condition is more likely to develop in: °· Males aged 30-50 years. °· Females aged 50-60 years. °· People who lift improperly. °· People who are overweight or live a sedentary lifestyle. °· People who smoke. °· People who perform repetitive activities that strain the spine. °SYMPTOMS °Symptoms of this condition include: °· Pain that goes down from the back into the legs (sciatica). This is the most common symptom. The pain may be worse with sitting, coughing, or sneezing. °· Pain and numbness in the arms and legs. °· Muscle weakness. °· Tingling. °· Loss of bladder control or bowel control. °DIAGNOSIS °This condition is diagnosed with a physical exam and medical history. If the pain is lasting, you may have tests, such as: °· MRI scan. °· X-ray. °· CT scan. °· Myelogram. °· Nerve conduction study. °TREATMENT °This condition is often treated with: °· Hot packs and ice applied to affected areas. °· Stretches to improve flexibility. °· Exercises to strengthen back muscles. °· Physical therapy. °· Pain medicine. °· A steroid injection in the spine. °In some cases, no treatment is needed. If the condition is long-lasting (chronic), or if symptoms are severe, treatment may involve surgery or lifestyle changes, such as following a weight loss plan. °HOME  CARE INSTRUCTIONS °Medicines °· Take medicines only as directed by your health care provider. °· Do not drive or operate heavy machinery while taking pain medicine. °Injury Care °· Apply a heat pack to the injured area as directed by your health care provider. °· Apply ice to the affected area: °¨ Put ice in a plastic bag. °¨ Place a towel between your skin and the bag. °¨ Leave the ice on for 20-30 minutes, every 2 hours while you are awake or as needed. Or, leave the ice on for as long as directed by your health care provider. °Other Instructions °· If you were shown how to do any exercises or stretches, do them as directed by your health care provider. °· If your health care provider prescribed a diet or exercise program, follow it as directed. °· Keep all follow-up visits as directed by your health care provider. This is important. °SEEK MEDICAL CARE IF: °· Your pain does not improve over time even when taking pain medicines. °SEEK IMMEDIATE MEDICAL CARE IF: °· Your develop severe pain. °· Your pain suddenly gets worse. °· You develop increasing weakness in your legs. °· You lose the ability to control your bladder or bowel. °· You have difficulty walking or balancing. °· You have a fever. °  °This information is not intended to replace advice given to you by your health care provider. Make sure you discuss any questions you have with your health care provider. °  °Document Released: 12/02/2005 Document Revised: 04/18/2015 Document Reviewed: 11/28/2014 °Elsevier Interactive Patient Education ©2016 [employer] ° °

## 2016-05-29 NOTE — Progress Notes (Signed)
   Subjective:    Patient ID: Christopher [name], male    DOB: 1946-11-27, 70 y.o.   MRN: [id number]  HPI Acute visit for right low back pain. Onset about one week ago. No injury. Sharp pain which occasionally radiates toward the right thigh. 9 out of 10 severity at its worst. Worse with back extension. Symptoms improved with leaning 4. Has used topical Biofreeze and ibuprofen without much improvement. No numbness or weakness. No associated urine or stool incontinence. No recent appetite or weight changes. Type 2 diabetes. Recent A1c 6.9%. No history of chronic back difficulties.  Past Medical History  Diagnosis Date  . GERD (gastroesophageal reflux disease)   . Hyperlipidemia   . Glaucoma   . Diabetes mellitus type II    Past Surgical History  Procedure Laterality Date  . Patella fracture surgery  1993  . Hernia repair      reports that he has never smoked. He does not have any smokeless tobacco history on file. He reports that he does not drink alcohol or use illicit drugs. family history includes Alcohol abuse in his father; Diabetes in his sister. No Known Allergies    Review of Systems  Constitutional: Negative for fever, activity change, appetite change and unexpected weight change.  Respiratory: Negative for cough and shortness of breath.   Cardiovascular: Negative for chest pain and leg swelling.  Gastrointestinal: Negative for vomiting and abdominal pain.  Genitourinary: Negative for dysuria, hematuria and flank pain.  Musculoskeletal: Positive for back pain. Negative for joint swelling.  Neurological: Negative for weakness and numbness.       Objective:   Physical Exam  Constitutional: He is oriented to person, place, and time. He appears well-developed and well-nourished. No distress.  Neck: No thyromegaly present.  Cardiovascular: Normal rate, regular rhythm and normal heart sounds.   No murmur heard. Pulmonary/Chest: Effort normal and breath sounds normal.  No respiratory distress. He has no wheezes. He has no rales.  Musculoskeletal: He exhibits no edema.  Straight leg raises are negative  Neurological: He is alert and oriented to person, place, and time. No cranial nerve deficit.  Difficult to elicit knee reflex bilaterally. Trace Achilles bilaterally. Full-strength throughout.  Skin: No rash noted.          Assessment & Plan:  Right lumbar radiculopathy symptoms. Nonfocal exam neurologically. We explained this could be related to nerve impingement from arthritis or possibly disc etiology. We've recommended Depo-Medrol and he is aware this may exacerbate blood sugars temporarily and monitor this closely. Limited hydrocodone No. 30 tablets to use every 6 hours for severe pain. Avoid heavy lifting or worsening back flexion over the next few days. Walking as tolerated. Touch base if not improving over the next week.  Eulas Post MD [facility]

## 2016-05-29 NOTE — Progress Notes (Signed)
Pre visit review using our clinic review tool, if applicable. No additional management support is needed unless otherwise documented below in the visit note. 

## 2016-09-16 ENCOUNTER — Encounter: Payer: Self-pay | Admitting: Endocrinology

## 2016-09-16 ENCOUNTER — Ambulatory Visit (INDEPENDENT_AMBULATORY_CARE_PROVIDER_SITE_OTHER): Payer: Medicare Other | Admitting: Endocrinology

## 2016-09-16 VITALS — BP 128/72 | HR 59 | Ht 76.0 in | Wt 194.0 lb

## 2016-09-16 DIAGNOSIS — E1065 Type 1 diabetes mellitus with hyperglycemia: Secondary | ICD-10-CM | POA: Diagnosis not present

## 2016-09-16 DIAGNOSIS — IMO0002 Reserved for concepts with insufficient information to code with codable children: Secondary | ICD-10-CM

## 2016-09-16 DIAGNOSIS — E108 Type 1 diabetes mellitus with unspecified complications: Secondary | ICD-10-CM

## 2016-09-16 LAB — POCT GLYCOSYLATED HEMOGLOBIN (HGB A1C): Hemoglobin A1C: 11

## 2016-09-16 NOTE — Progress Notes (Signed)
Subjective:    Patient ID: Christopher [name], male    DOB: 11/29/1946, 70 y.o.   MRN: [id number]  HPI Pt returns for f/u of diabetes mellitus: DM type: Insulin-requiring type 2 (but he is presumed to be developing type 1, due to lean body habitus and neg FHx) Dx'ed: 9784 Complications: polyneuropathy Therapy: insulin since 2013 DKA: never Severe hypoglycemia: last episode was in early 2016. Pancreatitis: never.  Other: he takes multiple daily injections; the pattern of cbg's says he does not need basal insulin.   Interval history: no cbg record, but states cbg's vary from 140-200's.  It is highest fasting (higher than at HS, despite not eating after supper).  pt states he feels well in general. Past Medical History:  Diagnosis Date  . Diabetes mellitus type II   . GERD (gastroesophageal reflux disease)   . Glaucoma   . Hyperlipidemia     Past Surgical History:  Procedure Laterality Date  . HERNIA REPAIR    . [place]    Social History   Social History  . Marital status: Married    Spouse name: N/A  . Number of children: N/A  . Years of education: N/A   Occupational History  . Not on file.   Social History Main Topics  . Smoking status: Never Smoker  . Smokeless tobacco: Never Used  . Alcohol use No  . Drug use: No  . Sexual activity: Not on file   Other Topics Concern  . Not on file   Social History Narrative  . No narrative on file    Current Outpatient Prescriptions on File Prior to Visit  Medication Sig Dispense Refill  . Alcohol Swabs (B-D SINGLE USE SWABS REGULAR) PADS Use to check blood sugar 2 times per day. 200 each 3  . ALPHAGAN P 0.1 % SOLN     . aspirin EC 81 MG EC tablet Take 81 mg by mouth daily.      . Blood Glucose Calibration (ACCU-CHEK SMARTVIEW CONTROL) LIQD 1 each by In Vitro route as directed. 1 each 3  . Blood Glucose Monitoring Suppl (ACCU-CHEK NANO SMARTVIEW) W/DEVICE KIT 1 kit by Does not apply route once. 1 kit  0  . dorzolamide-timolol (COSOPT) 22.3-6.8 MG/ML ophthalmic solution     . fish oil-omega-3 fatty acids 1000 MG capsule Take 2 g by mouth daily.      . Flax OIL Take by mouth daily.      Marland Kitchen glucagon 1 MG injection Inject 1 mg into the muscle once as needed. 1 each 12  . glucose blood (ONE TOUCH ULTRA TEST) test strip Use to check blood sugar 2 times daily. 200 each 2  . hydrochlorothiazide (HYDRODIURIL) 25 MG tablet Take 1 tablet (25 mg total) by mouth daily. 90 tablet 3  . HYDROcodone-acetaminophen (NORCO/VICODIN) 5-325 MG tablet Take 1-2 tablets by mouth every 6 (six) hours as needed for moderate pain. 30 tablet 0  . insulin lispro (HUMALOG KWIKPEN) 100 UNIT/ML KiwkPen 3 times a day (just before each meal) 13-8-12 units. 45 mL 3  . Insulin Pen Needle (BD PEN NEEDLE NANO U/F) 32G X 4 MM MISC Use to inject insulin 3 times per day. 300 each 2  . L-Methylfolate-B6-B12 (METANX) 3-35-2 MG TABS Take by mouth 2 (two) times daily.      Marland Kitchen lisinopril (PRINIVIL,ZESTRIL) 2.5 MG tablet Take 1 tablet (2.5 mg total) by mouth daily. 90 tablet 3  . Multiple Vitamins-Minerals (CENTRUM SILVER ULTRA MENS  PO) Take by mouth daily.      Glory Rosebush DELICA LANCETS 74F MISC Use to check blood sugar 2 times per day. 200 each 2  . Potassium 99 MG TABS Take by mouth daily.      . simvastatin (ZOCOR) 40 MG tablet 1 1/2 tablets nightly (Patient taking differently: Take 40 mg by mouth daily at 6 PM. ) 135 tablet 3   No current facility-administered medications on file prior to visit.     No Known Allergies  Family History  Problem Relation Age of Onset  . Alcohol abuse Father   . Diabetes Sister     BP 128/72   Pulse (!) 59   Ht 6' 4"  (1.93 m)   Wt 194 lb (88 kg)   SpO2 99%   BMI 23.61 kg/m    Review of Systems He denies hypoglycemia    Objective:   Physical Exam VITAL SIGNS:  See vs page GENERAL: no distress Pulses: dorsalis pedis intact bilat.   MSK: no deformity of the feet CV: no leg edema.     Skin:  no ulcer on the feet.  normal color and temp on the feet. Neuro: sensation is intact to touch on the feet.  A1c=11.0%     Assessment & Plan:  Insulin-requiring type 2 DM: poor control.   Patient is advised the following: Patient Instructions  check your blood sugar twice a day.  vary the time of day when you check, between before the 3 meals, and at bedtime.  also check if you have symptoms of your blood sugar being too high or too low.  please keep a record of the readings and bring it to your next appointment here (or you can bring the meter itself).  You can write it on any piece of paper.  please call us sooner if your blood sugar goes below 70, or if you have a lot of readings over 200.  blood tests are requested for you today.  We'll let you know about the results. If it is high, we'll add insulin at bedtime.   Please come back for a follow-up appointment in 2 months.

## 2016-09-16 NOTE — Patient Instructions (Addendum)
check your blood sugar twice a day.  vary the time of day when you check, between before the 3 meals, and at bedtime.  also check if you have symptoms of your blood sugar being too high or too low.  please keep a record of the readings and bring it to your next appointment here (or you can bring the meter itself).  You can write it on any piece of paper.  please call us sooner if your blood sugar goes below 70, or if you have a lot of readings over 200.  blood tests are requested for you today.  We'll let you know about the results. If it is high, we'll add insulin at bedtime.   Please come back for a follow-up appointment in 2 months.

## 2016-09-17 ENCOUNTER — Encounter: Payer: Self-pay | Admitting: Family Medicine

## 2016-09-17 ENCOUNTER — Ambulatory Visit (INDEPENDENT_AMBULATORY_CARE_PROVIDER_SITE_OTHER): Payer: Medicare Other | Admitting: Family Medicine

## 2016-09-17 VITALS — BP 120/78 | HR 74 | Temp 98.3°F | Wt 193.1 lb

## 2016-09-17 DIAGNOSIS — Z23 Encounter for immunization: Secondary | ICD-10-CM | POA: Diagnosis not present

## 2016-09-17 DIAGNOSIS — M545 Low back pain, unspecified: Secondary | ICD-10-CM

## 2016-09-17 MED ORDER — PREDNISONE 20 MG PO TABS: ORAL_TABLET | ORAL | 1 refills | Status: DC

## 2016-09-17 MED ORDER — TRAMADOL HCL 50 MG PO TABS: ORAL_TABLET | ORAL | 0 refills | Status: DC

## 2016-09-17 NOTE — Patient Instructions (Signed)
Prednisone 20 mg.......Marland Kitchen 1 tab daily for 5 days, half a tab daily for 5 days, then half a tab Monday Wednesday Friday for a two-week taper  Tramadol 50 mg..........Marland Kitchen 1/2-1 tablet at bedtime for severe pain  Stretching exercises twice daily.........Marland Kitchen McKinzie  If we don't see your symptoms improve in the next couple weeks call and we will get you involve with a physical therapist

## 2016-09-17 NOTE — Progress Notes (Signed)
Christopher [name] is a 70 year old married male who comes in today for evaluation of low back pain  We saw him for this problem many years ago. We found a leak length discrepancy that was significant however he's not had any trouble with his back for many many years. About ago he was helping a friend doing some moving a day or 2 later he started having back pain. He describes his sharp but it comes and goes sometimes last for seconds sometimes hours. Seems to be worse at night. It when it's at its worst he says it's a 9 out of 10. He points to his right hip as a source of his pain. The pain is decreased by walking increased by standing. No increased pain if he coughs or sneezes. The pain radiates to his posterior right thigh on occasion.  He's not had any fever chills nausea vomiting or bladder problems. No numbness or weakness.  Physical examination  Vital signs stable he is afebrile examination of the legs in supine position shows the right leg to be longer than the left by a quarter to a half an inch. The sensation muscle strength reflexes circulation straight leg raising all within normal limits  Impression mechanical low back pain plan exercise prednisone return when necessary PT if symptoms do not abate with conservative therapy

## 2016-09-18 ENCOUNTER — Other Ambulatory Visit: Payer: Self-pay | Admitting: Endocrinology

## 2016-09-18 LAB — FRUCTOSAMINE: FRUCTOSAMINE: 479 umol/L — AB (ref 190–270)

## 2016-09-18 MED ORDER — INSULIN GLARGINE 100 UNIT/ML SOLOSTAR PEN: 5.0000 [IU] | PEN_INJECTOR | Freq: Every day | SUBCUTANEOUS | 99 refills | Status: DC

## 2016-10-28 ENCOUNTER — Other Ambulatory Visit: Payer: Self-pay

## 2016-10-28 MED ORDER — INSULIN PEN NEEDLE 32G X 4 MM MISC: 2 refills | Status: DC

## 2016-10-29 ENCOUNTER — Other Ambulatory Visit: Payer: Self-pay

## 2016-10-29 ENCOUNTER — Telehealth: Payer: Self-pay | Admitting: Endocrinology

## 2016-10-29 MED ORDER — INSULIN PEN NEEDLE 32G X 4 MM MISC: 2 refills | Status: DC

## 2016-10-29 NOTE — Telephone Encounter (Signed)
Patient has

## 2016-11-15 ENCOUNTER — Encounter: Payer: Self-pay | Admitting: Endocrinology

## 2016-11-15 ENCOUNTER — Ambulatory Visit (INDEPENDENT_AMBULATORY_CARE_PROVIDER_SITE_OTHER): Payer: Medicare Other | Admitting: Endocrinology

## 2016-11-15 VITALS — BP 102/80 | HR 66 | Wt 194.4 lb

## 2016-11-15 DIAGNOSIS — E1065 Type 1 diabetes mellitus with hyperglycemia: Secondary | ICD-10-CM

## 2016-11-15 DIAGNOSIS — E108 Type 1 diabetes mellitus with unspecified complications: Secondary | ICD-10-CM

## 2016-11-15 DIAGNOSIS — IMO0002 Reserved for concepts with insufficient information to code with codable children: Secondary | ICD-10-CM

## 2016-11-15 LAB — POCT GLYCOSYLATED HEMOGLOBIN (HGB A1C): HEMOGLOBIN A1C: 11.9

## 2016-11-15 NOTE — Progress Notes (Signed)
Subjective:    Patient ID: Christopher [name], male    DOB: 04-Dec-1946, 70 y.o.   MRN: [id number]  HPI Pt returns for f/u of diabetes mellitus: DM type: Insulin-requiring type 2 (but he is presumed to be developing type 1, due to lean body habitus and neg FHx).   Dx'ed: 2010.   Complications: polyneuropathy.    Therapy: insulin since 2013.   DKA: never.   Severe hypoglycemia: last episode was in early 2016.   Pancreatitis: never.  Other: he takes multiple daily injections.    Interval history: no cbg record, but states he just finished 1 month of prednisone, for low-back pain.  He says prior to the prednisone, cbg's were well-controlled.   Past Medical History:  Diagnosis Date  . Diabetes mellitus type II   . GERD (gastroesophageal reflux disease)   . Glaucoma   . Hyperlipidemia     Past Surgical History:  Procedure Laterality Date  . HERNIA REPAIR    . [place]    Social History   Social History  . Marital status: Married    Spouse name: N/A  . Number of children: N/A  . Years of education: N/A   Occupational History  . Not on file.   Social History Main Topics  . Smoking status: Never Smoker  . Smokeless tobacco: Never Used  . Alcohol use No  . Drug use: No  . Sexual activity: Not on file   Other Topics Concern  . Not on file   Social History Narrative  . No narrative on file    Current Outpatient Prescriptions on File Prior to Visit  Medication Sig Dispense Refill  . Alcohol Swabs (B-D SINGLE USE SWABS REGULAR) PADS Use to check blood sugar 2 times per day. 200 each 3  . ALPHAGAN P 0.1 % SOLN     . aspirin EC 81 MG EC tablet Take 81 mg by mouth daily.      . Blood Glucose Calibration (ACCU-CHEK SMARTVIEW CONTROL) LIQD 1 each by In Vitro route as directed. 1 each 3  . Blood Glucose Monitoring Suppl (ACCU-CHEK NANO SMARTVIEW) W/DEVICE KIT 1 kit by Does not apply route once. 1 kit 0  . dorzolamide-timolol (COSOPT) 22.3-6.8 MG/ML  ophthalmic solution     . fish oil-omega-3 fatty acids 1000 MG capsule Take 2 g by mouth daily.      . Flax OIL Take by mouth daily.      Marland Kitchen glucagon 1 MG injection Inject 1 mg into the muscle once as needed. 1 each 12  . glucose blood (ONE TOUCH ULTRA TEST) test strip Use to check blood sugar 2 times daily. 200 each 2  . hydrochlorothiazide (HYDRODIURIL) 25 MG tablet Take 1 tablet (25 mg total) by mouth daily. 90 tablet 3  . HYDROcodone-acetaminophen (NORCO/VICODIN) 5-325 MG tablet Take 1-2 tablets by mouth every 6 (six) hours as needed for moderate pain. 30 tablet 0  . Insulin Glargine (LANTUS SOLOSTAR) 100 UNIT/ML Solostar Pen Inject 5 Units into the skin at bedtime. 5 pen PRN  . insulin lispro (HUMALOG KWIKPEN) 100 UNIT/ML KiwkPen 3 times a day (just before each meal) 13-8-12 units. 45 mL 3  . Insulin Pen Needle (BD PEN NEEDLE NANO U/F) 32G X 4 MM MISC Use to inject insulin 4 times per day. 400 each 2  . L-Methylfolate-B6-B12 (METANX) 3-35-2 MG TABS Take by mouth 2 (two) times daily.      Marland Kitchen lisinopril (PRINIVIL,ZESTRIL) 2.5 MG tablet  Take 1 tablet (2.5 mg total) by mouth daily. 90 tablet 3  . Multiple Vitamins-Minerals (CENTRUM SILVER ULTRA MENS PO) Take by mouth daily.      Glory Rosebush DELICA LANCETS 50K MISC Use to check blood sugar 2 times per day. 200 each 2  . Potassium 99 MG TABS Take by mouth daily.      . predniSONE (DELTASONE) 20 MG tablet 1 tablet daily for 5 days, half a tab for 5 days, then a half a tab Monday Wednesday Friday for a three-week taper 40 tablet 1  . simvastatin (ZOCOR) 40 MG tablet 1 1/2 tablets nightly (Patient taking differently: Take 40 mg by mouth daily at 6 PM. ) 135 tablet 3  . traMADol (ULTRAM) 50 MG tablet 1/2-1 tablet at bedtime when necessary for severe pain 30 tablet 0   No current facility-administered medications on file prior to visit.     No Known Allergies  Family History  Problem Relation Age of Onset  . Alcohol abuse Father   . Diabetes Sister      BP 102/80   Pulse 66   Wt 194 lb 6.4 oz (88.2 kg)   SpO2 98%   BMI 23.66 kg/m    Review of Systems He denies hypoglycemia.      Objective:   Physical Exam VITAL SIGNS:  See vs page.   GENERAL: no distress.   Pulses: dorsalis pedis intact bilat.   MSK: no deformity of the feet CV: no leg edema Skin:  no ulcer on the feet.  normal color and temp on the feet. Neuro: sensation is intact to touch on the feet.    A1c=11.9%    Assessment & Plan:  Insulin-requiring type 2 DM, with polyneuropathy: worse.  Low-back pain: steroid rx is worsening glycemic control.  Pt says this accounts for the a1c  Patient is advised the following: Patient Instructions  check your blood sugar twice a day.  vary the time of day when you check, between before the 3 meals, and at bedtime.  also check if you have symptoms of your blood sugar being too high or too low.  please keep a record of the readings and bring it to your next appointment here (or you can bring the meter itself).  You can write it on any piece of paper.  please call us sooner if your blood sugar goes below 70, or if you have a lot of readings over 200.  Please continue the same insulins.    Please come back for a follow-up appointment in 2 months.

## 2016-11-15 NOTE — Patient Instructions (Addendum)
check your blood sugar twice a day.  vary the time of day when you check, between before the 3 meals, and at bedtime.  also check if you have symptoms of your blood sugar being too high or too low.  please keep a record of the readings and bring it to your next appointment here (or you can bring the meter itself).  You can write it on any piece of paper.  please call us sooner if your blood sugar goes below 70, or if you have a lot of readings over 200. Please continue the same insulins.    Please come back for a follow-up appointment in 2 months.    

## 2017-01-06 ENCOUNTER — Telehealth: Payer: Self-pay | Admitting: Endocrinology

## 2017-01-06 MED ORDER — INSULIN LISPRO 100 UNIT/ML (KWIKPEN): PEN_INJECTOR | SUBCUTANEOUS | 3 refills | Status: DC

## 2017-01-06 NOTE — Telephone Encounter (Signed)
Refill submitted. 

## 2017-01-06 NOTE — Telephone Encounter (Signed)
Refill   insulin lispro (HUMALOG KWIKPEN) 100 UNIT/ML KiwkPen   send to   [facility]	[place]   Fax # [phone]  Phone # [phone]

## 2017-01-09 ENCOUNTER — Encounter: Payer: Medicare Other | Admitting: Adult Health

## 2017-01-16 ENCOUNTER — Ambulatory Visit: Payer: Medicare Other | Admitting: Endocrinology

## 2017-01-16 ENCOUNTER — Telehealth: Payer: Self-pay | Admitting: Endocrinology

## 2017-01-16 NOTE — Telephone Encounter (Signed)
Patient no showed today's appt. Please advise on how to follow up. °A. No follow up necessary. °B. Follow up urgent. Contact patient immediately. °C. Follow up necessary. Contact patient and schedule visit in ___ days. °D. Follow up advised. Contact patient and schedule visit in ____weeks. ° °

## 2017-01-17 NOTE — Telephone Encounter (Signed)
Follow up advised. Contact patient and schedule visit in 4 weeks 

## 2017-01-30 ENCOUNTER — Ambulatory Visit: Payer: Medicare Other | Admitting: Endocrinology

## 2017-02-10 ENCOUNTER — Telehealth: Payer: Self-pay | Admitting: Family Medicine

## 2017-02-10 DIAGNOSIS — E785 Hyperlipidemia, unspecified: Secondary | ICD-10-CM

## 2017-02-10 MED ORDER — LISINOPRIL 2.5 MG PO TABS: 2.5000 mg | ORAL_TABLET | Freq: Every day | ORAL | 3 refills | Status: DC

## 2017-02-10 MED ORDER — SIMVASTATIN 40 MG PO TABS: ORAL_TABLET | ORAL | 3 refills | Status: DC

## 2017-02-10 MED ORDER — HYDROCHLOROTHIAZIDE 25 MG PO TABS: 25.0000 mg | ORAL_TABLET | Freq: Every day | ORAL | 3 refills | Status: DC

## 2017-02-10 NOTE — Telephone Encounter (Signed)
Done

## 2017-02-10 NOTE — Telephone Encounter (Signed)
° ° ° °  Pt request refill of the following:  simvastatin (ZOCOR) 40 MG tablet  hydrochlorothiazide (HYDRODIURIL) 25 MG tablet  lisinopril (PRINIVIL,ZESTRIL) 2.5 MG tablet     Phamacy:  [facility]

## 2017-02-18 ENCOUNTER — Ambulatory Visit (INDEPENDENT_AMBULATORY_CARE_PROVIDER_SITE_OTHER): Payer: Medicare HMO | Admitting: Endocrinology

## 2017-02-18 ENCOUNTER — Encounter: Payer: Self-pay | Admitting: Endocrinology

## 2017-02-18 VITALS — BP 118/82 | HR 81 | Ht 76.0 in | Wt 204.0 lb

## 2017-02-18 DIAGNOSIS — E1065 Type 1 diabetes mellitus with hyperglycemia: Secondary | ICD-10-CM

## 2017-02-18 DIAGNOSIS — IMO0002 Reserved for concepts with insufficient information to code with codable children: Secondary | ICD-10-CM

## 2017-02-18 DIAGNOSIS — E108 Type 1 diabetes mellitus with unspecified complications: Secondary | ICD-10-CM | POA: Diagnosis not present

## 2017-02-18 LAB — POCT GLYCOSYLATED HEMOGLOBIN (HGB A1C): HEMOGLOBIN A1C: 11

## 2017-02-18 MED ORDER — INSULIN GLARGINE 100 UNIT/ML SOLOSTAR PEN: 40.0000 [IU] | PEN_INJECTOR | SUBCUTANEOUS | 99 refills | Status: DC

## 2017-02-18 NOTE — Progress Notes (Signed)
Subjective:    Patient ID: Christopher [name], male    DOB: 10-28-1946, 71 y.o.   MRN: [id number]  HPI Pt returns for f/u of diabetes mellitus: DM type: Insulin-requiring type 2 (but he is presumed to be developing type 1, due to lean body habitus and neg FHx).   Dx'ed: 2010.   Complications: polyneuropathy.    Therapy: insulin since 2013.   DKA: never.   Severe hypoglycemia: last episode was in early 2016.   Pancreatitis: never.  Other: he takes multiple daily injections.    Interval history: no recent steroids.  He says he sometimes misses the insulin.  no cbg record, but states cbg's are persistently in the high-200's.   Past Medical History:  Diagnosis Date  . Diabetes mellitus type II   . GERD (gastroesophageal reflux disease)   . Glaucoma   . Hyperlipidemia     Past Surgical History:  Procedure Laterality Date  . HERNIA REPAIR    . [place]    Social History   Social History  . Marital status: Married    Spouse name: N/A  . Number of children: N/A  . Years of education: N/A   Occupational History  . Not on file.   Social History Main Topics  . Smoking status: Never Smoker  . Smokeless tobacco: Never Used  . Alcohol use No  . Drug use: No  . Sexual activity: Not on file   Other Topics Concern  . Not on file   Social History Narrative  . No narrative on file    Current Outpatient Prescriptions on File Prior to Visit  Medication Sig Dispense Refill  . Alcohol Swabs (B-D SINGLE USE SWABS REGULAR) PADS Use to check blood sugar 2 times per day. 200 each 3  . ALPHAGAN P 0.1 % SOLN     . aspirin EC 81 MG EC tablet Take 81 mg by mouth daily.      . Blood Glucose Calibration (ACCU-CHEK SMARTVIEW CONTROL) LIQD 1 each by In Vitro route as directed. 1 each 3  . Blood Glucose Monitoring Suppl (ACCU-CHEK NANO SMARTVIEW) W/DEVICE KIT 1 kit by Does not apply route once. 1 kit 0  . dorzolamide-timolol (COSOPT) 22.3-6.8 MG/ML ophthalmic solution       . fish oil-omega-3 fatty acids 1000 MG capsule Take 2 g by mouth daily.      . Flax OIL Take by mouth daily.      Marland Kitchen glucagon 1 MG injection Inject 1 mg into the muscle once as needed. 1 each 12  . glucose blood (ONE TOUCH ULTRA TEST) test strip Use to check blood sugar 2 times daily. 200 each 2  . hydrochlorothiazide (HYDRODIURIL) 25 MG tablet Take 1 tablet (25 mg total) by mouth daily. 90 tablet 3  . HYDROcodone-acetaminophen (NORCO/VICODIN) 5-325 MG tablet Take 1-2 tablets by mouth every 6 (six) hours as needed for moderate pain. 30 tablet 0  . L-Methylfolate-B6-B12 (METANX) 3-35-2 MG TABS Take by mouth 2 (two) times daily.      Marland Kitchen lisinopril (PRINIVIL,ZESTRIL) 2.5 MG tablet Take 1 tablet (2.5 mg total) by mouth daily. 90 tablet 3  . Multiple Vitamins-Minerals (CENTRUM SILVER ULTRA MENS PO) Take by mouth daily.      Glory Rosebush DELICA LANCETS 71Q MISC Use to check blood sugar 2 times per day. 200 each 2  . Potassium 99 MG TABS Take by mouth daily.      . predniSONE (DELTASONE) 20 MG tablet 1  tablet daily for 5 days, half a tab for 5 days, then a half a tab Monday Wednesday Friday for a three-week taper 40 tablet 1  . simvastatin (ZOCOR) 40 MG tablet 1 1/2 tablets nightly 135 tablet 3  . traMADol (ULTRAM) 50 MG tablet 1/2-1 tablet at bedtime when necessary for severe pain 30 tablet 0   No current facility-administered medications on file prior to visit.     No Known Allergies  Family History  Problem Relation Age of Onset  . Alcohol abuse Father   . Diabetes Sister    BP 118/82   Pulse 81   Ht _0  (1.93 m)   Wt 204 lb (92.5 kg)   SpO2 98%   BMI 24.83 kg/m   Review of Systems He denies hypoglycemia.      Objective:   Physical Exam VITAL SIGNS:  See vs page GENERAL: no distress Pulses: dorsalis pedis intact bilat.   MSK: no deformity of the feet CV: no leg edema Skin:  no ulcer on the feet.  normal color and temp on the feet. Neuro: sensation is intact to touch on the  feet.    Lab Results  Component Value Date   HGBA1C 11.0 02/18/2017      Assessment & Plan:  Insulin-requiring type 2 DM, with polyneuropathy, worse.  Noncompliance with cbg recording and insulin: He needs a simpler insulin regimen.  We'll change to QD.   Patient is advised the following: Patient Instructions  check your blood sugar twice a day.  vary the time of day when you check, between before the 3 meals, and at bedtime.  also check if you have symptoms of your blood sugar being too high or too low.  please keep a record of the readings and bring it to your next appointment here (or you can bring the meter itself).  You can write it on any piece of paper.  please call us sooner if your blood sugar goes below 70, or if you have a lot of readings over 200.  Please stop taking the humalog, and:  Increase the lantus to 40 units each morning.   Please come back for a follow-up appointment in 2 months.

## 2017-02-18 NOTE — Patient Instructions (Addendum)
check your blood sugar twice a day.  vary the time of day when you check, between before the 3 meals, and at bedtime.  also check if you have symptoms of your blood sugar being too high or too low.  please keep a record of the readings and bring it to your next appointment here (or you can bring the meter itself).  You can write it on any piece of paper.  please call us sooner if your blood sugar goes below 70, or if you have a lot of readings over 200.  Please stop taking the humalog, and:  Increase the lantus to 40 units each morning.   Please come back for a follow-up appointment in 2 months.

## 2017-02-26 ENCOUNTER — Telehealth: Payer: Self-pay

## 2017-02-26 NOTE — Telephone Encounter (Signed)
Received PA request from insurance company for Simvastatin 40 mg tablet. PA submitted & is pending. Key:  [id number]

## 2017-02-27 NOTE — Telephone Encounter (Signed)
PA approved, form not faxed back because insurance company's mail order fills Rx for patient.

## 2017-03-25 ENCOUNTER — Other Ambulatory Visit (INDEPENDENT_AMBULATORY_CARE_PROVIDER_SITE_OTHER): Payer: Medicare HMO

## 2017-03-25 ENCOUNTER — Other Ambulatory Visit: Payer: Self-pay | Admitting: Family Medicine

## 2017-03-25 DIAGNOSIS — E785 Hyperlipidemia, unspecified: Secondary | ICD-10-CM | POA: Diagnosis not present

## 2017-03-25 DIAGNOSIS — I10 Essential (primary) hypertension: Secondary | ICD-10-CM

## 2017-03-25 DIAGNOSIS — Z125 Encounter for screening for malignant neoplasm of prostate: Secondary | ICD-10-CM

## 2017-03-25 DIAGNOSIS — E1065 Type 1 diabetes mellitus with hyperglycemia: Secondary | ICD-10-CM

## 2017-03-25 DIAGNOSIS — E108 Type 1 diabetes mellitus with unspecified complications: Secondary | ICD-10-CM | POA: Diagnosis not present

## 2017-03-25 DIAGNOSIS — IMO0002 Reserved for concepts with insufficient information to code with codable children: Secondary | ICD-10-CM

## 2017-03-25 DIAGNOSIS — R739 Hyperglycemia, unspecified: Secondary | ICD-10-CM

## 2017-03-25 LAB — MICROALBUMIN / CREATININE URINE RATIO
Creatinine,U: 261.5 mg/dL
MICROALB/CREAT RATIO: 0.3 mg/g (ref 0.0–30.0)
Microalb, Ur: <0.7 mg/dL (ref 0.0–1.9)

## 2017-03-25 LAB — PSA: PSA: 0.46 ng/mL (ref 0.10–4.00)

## 2017-03-25 LAB — BASIC METABOLIC PANEL
BUN: 16 mg/dL (ref 6–23)
CALCIUM: 9.3 mg/dL (ref 8.4–10.5)
CO2: 28 mEq/L (ref 19–32)
CREATININE: 1.22 mg/dL (ref 0.40–1.50)
Chloride: 103 mEq/L (ref 96–112)
GFR: 75.58 mL/min (ref >60.00)
Glucose, Bld: 188 mg/dL — ABNORMAL HIGH (ref 70–99)
Potassium: 3.8 mEq/L (ref 3.5–5.1)
SODIUM: 139 meq/L (ref 135–145)

## 2017-03-25 LAB — CBC WITH DIFFERENTIAL/PLATELET
Basophils Absolute: 0 10*3/uL (ref 0.0–0.1)
Basophils Relative: 0.6 % (ref 0.0–3.0)
EOS ABS: 0.2 10*3/uL (ref 0.0–0.7)
Eosinophils Relative: 3.3 % (ref 0.0–5.0)
HCT: 40.5 % (ref 39.0–52.0)
HEMOGLOBIN: 13.9 g/dL (ref 13.0–17.0)
LYMPHS PCT: 27.4 % (ref 12.0–46.0)
Lymphs Abs: 1.5 10*3/uL (ref 0.7–4.0)
MCHC: 34.4 g/dL (ref 30.0–36.0)
MCV: 79 fl (ref 78.0–100.0)
MONOS PCT: 8.7 % (ref 3.0–12.0)
Monocytes Absolute: 0.5 10*3/uL (ref 0.1–1.0)
Neutro Abs: 3.3 10*3/uL (ref 1.4–7.7)
Neutrophils Relative %: 60 % (ref 43.0–77.0)
Platelets: 231 10*3/uL (ref 150.0–400.0)
RBC: 5.13 Mil/uL (ref 4.22–5.81)
RDW: 14.4 % (ref 11.5–15.5)
WBC: 5.5 10*3/uL (ref 4.0–10.5)

## 2017-03-25 LAB — POC URINALSYSI DIPSTICK (AUTOMATED)
Bilirubin, UA: NEGATIVE
Blood, UA: NEGATIVE
GLUCOSE UA: NEGATIVE
LEUKOCYTES UA: NEGATIVE
Nitrite, UA: NEGATIVE
Protein, UA: NEGATIVE
Spec Grav, UA: 1.03 (ref 1.030–1.035)
Urobilinogen, UA: 0.2 (ref ?–2.0)
pH, UA: 6 (ref 5.0–8.0)

## 2017-03-25 LAB — HEPATIC FUNCTION PANEL
ALBUMIN: 3.8 g/dL (ref 3.5–5.2)
ALT: 14 U/L (ref 0–53)
AST: 16 U/L (ref 0–37)
Alkaline Phosphatase: 62 U/L (ref 39–117)
BILIRUBIN TOTAL: 0.8 mg/dL (ref 0.2–1.2)
Bilirubin, Direct: 0.2 mg/dL (ref 0.0–0.3)
TOTAL PROTEIN: 6 g/dL (ref 6.0–8.3)

## 2017-03-25 LAB — LIPID PANEL
Cholesterol: 173 mg/dL (ref 0–200)
HDL: 48.5 mg/dL (ref >39.00)
LDL Cholesterol: 106 mg/dL — ABNORMAL HIGH (ref 0–99)
NonHDL: 124.38
Total CHOL/HDL Ratio: 4
Triglycerides: 94 mg/dL (ref 0.0–149.0)
VLDL: 18.8 mg/dL (ref 0.0–40.0)

## 2017-03-25 LAB — TSH: TSH: 0.65 u[IU]/mL (ref 0.35–4.50)

## 2017-03-25 LAB — HEMOGLOBIN A1C: Hgb A1c MFr Bld: 9.2 % — ABNORMAL HIGH (ref 4.6–6.5)

## 2017-03-26 ENCOUNTER — Other Ambulatory Visit: Payer: Medicare HMO

## 2017-03-27 DIAGNOSIS — H401131 Primary open-angle glaucoma, bilateral, mild stage: Secondary | ICD-10-CM | POA: Diagnosis not present

## 2017-03-27 DIAGNOSIS — H4030X1 Glaucoma secondary to eye trauma, unspecified eye, mild stage: Secondary | ICD-10-CM | POA: Diagnosis not present

## 2017-03-27 DIAGNOSIS — H5340 Unspecified visual field defects: Secondary | ICD-10-CM | POA: Diagnosis not present

## 2017-03-31 ENCOUNTER — Encounter: Payer: Self-pay | Admitting: Family Medicine

## 2017-03-31 ENCOUNTER — Ambulatory Visit (INDEPENDENT_AMBULATORY_CARE_PROVIDER_SITE_OTHER): Payer: Medicare HMO | Admitting: Family Medicine

## 2017-03-31 VITALS — BP 106/66 | Temp 98.0°F | Ht 74.0 in | Wt 213.0 lb

## 2017-03-31 DIAGNOSIS — E1065 Type 1 diabetes mellitus with hyperglycemia: Secondary | ICD-10-CM | POA: Diagnosis not present

## 2017-03-31 DIAGNOSIS — Z0001 Encounter for general adult medical examination with abnormal findings: Secondary | ICD-10-CM

## 2017-03-31 DIAGNOSIS — Z136 Encounter for screening for cardiovascular disorders: Secondary | ICD-10-CM | POA: Insufficient documentation

## 2017-03-31 DIAGNOSIS — Z0000 Encounter for general adult medical examination without abnormal findings: Secondary | ICD-10-CM

## 2017-03-31 DIAGNOSIS — Z23 Encounter for immunization: Secondary | ICD-10-CM

## 2017-03-31 DIAGNOSIS — I10 Essential (primary) hypertension: Secondary | ICD-10-CM

## 2017-03-31 DIAGNOSIS — E785 Hyperlipidemia, unspecified: Secondary | ICD-10-CM | POA: Diagnosis not present

## 2017-03-31 DIAGNOSIS — IMO0002 Reserved for concepts with insufficient information to code with codable children: Secondary | ICD-10-CM

## 2017-03-31 DIAGNOSIS — E108 Type 1 diabetes mellitus with unspecified complications: Secondary | ICD-10-CM

## 2017-03-31 MED ORDER — HYDROCHLOROTHIAZIDE 25 MG PO TABS: 25.0000 mg | ORAL_TABLET | Freq: Every day | ORAL | 3 refills | Status: DC

## 2017-03-31 MED ORDER — SIMVASTATIN 80 MG PO TABS: 80.0000 mg | ORAL_TABLET | Freq: Every evening | ORAL | 3 refills | Status: DC

## 2017-03-31 MED ORDER — LISINOPRIL 2.5 MG PO TABS: 2.5000 mg | ORAL_TABLET | Freq: Every day | ORAL | 3 refills | Status: DC

## 2017-03-31 NOTE — Progress Notes (Signed)
Pre visit review using our clinic review tool, if applicable. No additional management support is needed unless otherwise documented below in the visit note. 

## 2017-03-31 NOTE — Addendum Note (Signed)
Addended by: Miles Costain T on: 03/31/2017 04:40 PM   Modules accepted: Orders

## 2017-03-31 NOTE — Progress Notes (Signed)
Christopher [name] is a 71 year old married male nonsmoker who comes in today for general physical examination because of a history of type 1 diabetes and hyperlipidemia  For diabetes he uses Lantus 40 units daily. A1c 9.2%  He has some mild fluid retention for takes Hydrocort thiazide 25 mg daily  He takes lisinopril 2.5 mg daily for renal protection  He takes Zocor 60 mg at bedtime for hyperlipidemia along with an aspirin tablet  Labs last week showed an LDL of 106. Would like to see that down to 75 range.  He gets routine eye care.......... sees his eye person every 4-6 months because he has glaucoma and is on eyedrops  Does not get regular dental care R refer him to Dr. Gloriann Loan  Colonoscopy 2008 was normal  Vaccinations tetanus May 2008 updated Pneumovax today information given on shingles vaccine.  Social history,,,,,,, married lives here in [place] retired works at the [employer] days a week  Cognitive function normal he walks daily home health safety reviewed no issues identified, no guns in the house, he does have a healthcare power of attorney and living well  EKG was done because a history of hyperlipidemia and hypertension and diabetes. EKG was normal and unchanged  BP 106/66 (BP Location: Right Arm, Patient Position: Sitting, Cuff Size: Large)   Temp 98 F (36.7 C) (Oral)   Ht 6\' 2"  (1.88 m)   Wt 213 lb (96.6 kg)   BMI 27.35 kg/m  Examination HEENT were negative neck was supple thyroid is not enlarged no carotid bruits cardiopulmonary exam normal abdominal exam normal genitalia normal circumcised male rectum normal stool guaiac-negative prostate normal extremity, skin normal peripheral pulses normal  Diabetes type 1......... not at goal. Increase insulin to 50 units daily. See Dr. Loanne Drilling in May ....Marland KitchenMarland KitchenMarland Kitchen   #2 hyperlipidemia............ increase Zocor to 80 mg day  #3 glaucoma........ continue follow-up by ophthalmologist on eyedrops  #4 poor dental care......Marland Kitchen referred to Dr. Gloriann Loan.,

## 2017-03-31 NOTE — Patient Instructions (Addendum)
LDL goal is 75,,,,,,,,,,,, increase your Zocor 80 mg daily  Continue other medications  Follow-up in one year sooner if any problems  Increase your insulin to 50 units daily  Call your insurance covering about the shingles vaccine  Follow-up with Dr. Loanne Drilling in May

## 2017-04-07 DIAGNOSIS — R69 Illness, unspecified: Secondary | ICD-10-CM | POA: Diagnosis not present

## 2017-04-22 ENCOUNTER — Encounter: Payer: Self-pay | Admitting: Endocrinology

## 2017-04-22 ENCOUNTER — Ambulatory Visit (INDEPENDENT_AMBULATORY_CARE_PROVIDER_SITE_OTHER): Payer: Medicare HMO | Admitting: Endocrinology

## 2017-04-22 VITALS — BP 118/72 | HR 66 | Wt 212.0 lb

## 2017-04-22 DIAGNOSIS — E1065 Type 1 diabetes mellitus with hyperglycemia: Secondary | ICD-10-CM

## 2017-04-22 DIAGNOSIS — E108 Type 1 diabetes mellitus with unspecified complications: Secondary | ICD-10-CM

## 2017-04-22 DIAGNOSIS — IMO0002 Reserved for concepts with insufficient information to code with codable children: Secondary | ICD-10-CM

## 2017-04-22 MED ORDER — INSULIN GLARGINE 100 UNIT/ML SOLOSTAR PEN: 50.0000 [IU] | PEN_INJECTOR | SUBCUTANEOUS | 99 refills | Status: DC

## 2017-04-22 NOTE — Patient Instructions (Addendum)
check your blood sugar twice a day.  vary the time of day when you check, between before the 3 meals, and at bedtime.  also check if you have symptoms of your blood sugar being too high or too low.  please keep a record of the readings and bring it to your next appointment here (or you can bring the meter itself).  You can write it on any piece of paper.  please call us sooner if your blood sugar goes below 70, or if you have a lot of readings over 200.   Please continue the same insulin.   Please come back for a follow-up appointment in 2 months.     

## 2017-04-22 NOTE — Progress Notes (Signed)
Subjective:    Patient ID: Christopher [name], male    DOB: September 30, 1946, 71 y.o.   MRN: [id number]  HPI Pt returns for f/u of diabetes mellitus: DM type: Insulin-requiring type 2 (but he is presumed to be developing type 1, due to lean body habitus and neg FHx).   Dx'ed: 2010.   Complications: polyneuropathy.    Therapy: insulin since 2013.   DKA: never.   Severe hypoglycemia: last episode was in early 2016.   Pancreatitis: never.  Other: he takes QD insulin, after noncompliance with multiple daily injections.  Interval history: no recent steroids.  He says he never misses the insulin.  no cbg record, but states cbg's vary from 70-200.  It is in general higher as the day goes on.  He has increased lantus to 50 units daily.  Past Medical History:  Diagnosis Date  . Diabetes mellitus type II   . GERD (gastroesophageal reflux disease)   . Glaucoma   . Hyperlipidemia     Past Surgical History:  Procedure Laterality Date  . HERNIA REPAIR    . [place]    Social History   Social History  . Marital status: Married    Spouse name: N/A  . Number of children: N/A  . Years of education: N/A   Occupational History  . Not on file.   Social History Main Topics  . Smoking status: Never Smoker  . Smokeless tobacco: Never Used  . Alcohol use No  . Drug use: No  . Sexual activity: Not on file   Other Topics Concern  . Not on file   Social History Narrative  . No narrative on file    Current Outpatient Prescriptions on File Prior to Visit  Medication Sig Dispense Refill  . Alcohol Swabs (B-D SINGLE USE SWABS REGULAR) PADS Use to check blood sugar 2 times per day. 200 each 3  . ALPHAGAN P 0.1 % SOLN     . aspirin EC 81 MG EC tablet Take 81 mg by mouth daily.      . Blood Glucose Calibration (ACCU-CHEK SMARTVIEW CONTROL) LIQD 1 each by In Vitro route as directed. 1 each 3  . Blood Glucose Monitoring Suppl (ACCU-CHEK NANO SMARTVIEW) W/DEVICE KIT 1 kit by Does  not apply route once. 1 kit 0  . dorzolamide-timolol (COSOPT) 22.3-6.8 MG/ML ophthalmic solution     . fish oil-omega-3 fatty acids 1000 MG capsule Take 2 g by mouth daily.      . Flax OIL Take by mouth daily.      Marland Kitchen glucagon 1 MG injection Inject 1 mg into the muscle once as needed. (Patient taking differently: Inject 1 mg into the muscle daily. ) 1 each 12  . glucose blood (ONE TOUCH ULTRA TEST) test strip Use to check blood sugar 2 times daily. 200 each 2  . hydrochlorothiazide (HYDRODIURIL) 25 MG tablet Take 1 tablet (25 mg total) by mouth daily. 90 tablet 3  . L-Methylfolate-B6-B12 (METANX) 3-35-2 MG TABS Take by mouth 2 (two) times daily.      Marland Kitchen lisinopril (PRINIVIL,ZESTRIL) 2.5 MG tablet Take 1 tablet (2.5 mg total) by mouth daily. 90 tablet 3  . Multiple Vitamins-Minerals (CENTRUM SILVER ULTRA MENS PO) Take by mouth daily.      Glory Rosebush DELICA LANCETS 34V MISC Use to check blood sugar 2 times per day. 200 each 2  . Potassium 99 MG TABS Take by mouth daily.      Marland Kitchen  simvastatin (ZOCOR) 80 MG tablet Take 1 tablet (80 mg total) by mouth every evening. 100 tablet 3  . traMADol (ULTRAM) 50 MG tablet 1/2-1 tablet at bedtime when necessary for severe pain 30 tablet 0   No current facility-administered medications on file prior to visit.     No Known Allergies  Family History  Problem Relation Age of Onset  . Alcohol abuse Father   . Diabetes Sister     BP 118/72 (BP Location: Left Arm, Patient Position: Sitting)   Pulse 66   Wt 212 lb (96.2 kg)   SpO2 98%   BMI 27.22 kg/m    Review of Systems He denies hypoglycemia.     Objective:   Physical Exam VITAL SIGNS:  See vs page.  GENERAL: no distress.  Pulses: dorsalis pedis intact bilat.   MSK: no deformity of the feet CV: no leg edema Skin:  no ulcer on the feet.  normal color and temp on the feet. Neuro: sensation is intact to touch on the feet.       Assessment & Plan:  Noncompliance with insulin: improved with  decreasing injection frequency. Insulin-requiring type 2 DM, with polyneuropathy: control is much better.   Patient Instructions  check your blood sugar twice a day.  vary the time of day when you check, between before the 3 meals, and at bedtime.  also check if you have symptoms of your blood sugar being too high or too low.  please keep a record of the readings and bring it to your next appointment here (or you can bring the meter itself).  You can write it on any piece of paper.  please call us sooner if your blood sugar goes below 70, or if you have a lot of readings over 200.  Please continue the same insulin.   Please come back for a follow-up appointment in 2 months.

## 2017-05-03 ENCOUNTER — Other Ambulatory Visit: Payer: Self-pay | Admitting: Endocrinology

## 2017-05-05 ENCOUNTER — Telehealth: Payer: Self-pay | Admitting: Endocrinology

## 2017-05-05 NOTE — Telephone Encounter (Signed)
Patient is taking a new supplement called Active stem cell, FYI just want Dr Loanne Drilling to know he is taking a new supplement.

## 2017-05-28 ENCOUNTER — Telehealth: Payer: Self-pay | Admitting: Endocrinology

## 2017-05-28 ENCOUNTER — Other Ambulatory Visit: Payer: Self-pay

## 2017-05-28 MED ORDER — INSULIN GLARGINE 100 UNIT/ML SOLOSTAR PEN: 50.0000 [IU] | PEN_INJECTOR | SUBCUTANEOUS | 99 refills | Status: DC

## 2017-05-28 NOTE — Telephone Encounter (Signed)
Pt came by today and said that Dr. Loanne Drilling had previously raised his Lantus to 40Units and then today Dr. Sherren Mocha raised it to 50 Units.  However, his pharmacy still only has him taking 5 Units.  He needs a new script for the new dosage sent to the [facility].

## 2017-06-23 ENCOUNTER — Ambulatory Visit (INDEPENDENT_AMBULATORY_CARE_PROVIDER_SITE_OTHER): Payer: Medicare HMO | Admitting: Endocrinology

## 2017-06-23 ENCOUNTER — Encounter: Payer: Self-pay | Admitting: Endocrinology

## 2017-06-23 VITALS — BP 122/80 | HR 61 | Ht 74.0 in | Wt 211.0 lb

## 2017-06-23 DIAGNOSIS — IMO0002 Reserved for concepts with insufficient information to code with codable children: Secondary | ICD-10-CM

## 2017-06-23 DIAGNOSIS — E108 Type 1 diabetes mellitus with unspecified complications: Secondary | ICD-10-CM

## 2017-06-23 DIAGNOSIS — E1065 Type 1 diabetes mellitus with hyperglycemia: Secondary | ICD-10-CM

## 2017-06-23 LAB — POCT GLYCOSYLATED HEMOGLOBIN (HGB A1C): Hemoglobin A1C: 9.8

## 2017-06-23 MED ORDER — INSULIN DETEMIR 100 UNIT/ML FLEXPEN: 70.0000 [IU] | PEN_INJECTOR | SUBCUTANEOUS | 11 refills | Status: DC

## 2017-06-23 NOTE — Addendum Note (Signed)
Addended by: Verlin Grills T on: 06/23/2017 08:55 AM   Modules accepted: Orders

## 2017-06-23 NOTE — Progress Notes (Signed)
Subjective:    Patient ID: Christopher [name], male    DOB: 04-08-1946, 71 y.o.   MRN: [id number]  HPI Pt returns for f/u of diabetes mellitus: DM type: Insulin-requiring type 2 (but he is presumed to be developing type 1, due to lean body habitus and neg FHx).   Dx'ed: 2010.   Complications: polyneuropathy.    Therapy: insulin since 2013.   DKA: never.   Severe hypoglycemia: last episode was in early 2016.   Pancreatitis: never.  Other: he takes QD insulin, after noncompliance with multiple daily injections.  Interval history: He says he never misses the insulin.  no cbg record, but states cbg's vary from 79-100's fasting, which is the only time of day he checks.  Past Medical History:  Diagnosis Date  . Diabetes mellitus type II   . GERD (gastroesophageal reflux disease)   . Glaucoma   . Hyperlipidemia     Past Surgical History:  Procedure Laterality Date  . HERNIA REPAIR    . [place]    Social History   Social History  . Marital status: Married    Spouse name: N/A  . Number of children: N/A  . Years of education: N/A   Occupational History  . Not on file.   Social History Main Topics  . Smoking status: Never Smoker  . Smokeless tobacco: Never Used  . Alcohol use No  . Drug use: No  . Sexual activity: Not on file   Other Topics Concern  . Not on file   Social History Narrative  . No narrative on file    Current Outpatient Prescriptions on File Prior to Visit  Medication Sig Dispense Refill  . Alcohol Swabs (B-D SINGLE USE SWABS REGULAR) PADS USE TO CHECK BLOOD SUGAR 2 TIMES PER DAY 200 each 3  . ALPHAGAN P 0.1 % SOLN     . aspirin EC 81 MG EC tablet Take 81 mg by mouth daily.      . Blood Glucose Calibration (ACCU-CHEK SMARTVIEW CONTROL) LIQD 1 each by In Vitro route as directed. 1 each 3  . Blood Glucose Monitoring Suppl (ACCU-CHEK NANO SMARTVIEW) W/DEVICE KIT 1 kit by Does not apply route once. 1 kit 0  . dorzolamide-timolol  (COSOPT) 22.3-6.8 MG/ML ophthalmic solution     . fish oil-omega-3 fatty acids 1000 MG capsule Take 2 g by mouth daily.      . Flax OIL Take by mouth daily.      Marland Kitchen glucagon 1 MG injection Inject 1 mg into the muscle once as needed. (Patient taking differently: Inject 1 mg into the muscle daily. ) 1 each 12  . glucose blood (ONE TOUCH ULTRA TEST) test strip Use to check blood sugar 2 times daily. 200 each 2  . hydrochlorothiazide (HYDRODIURIL) 25 MG tablet Take 1 tablet (25 mg total) by mouth daily. 90 tablet 3  . Insulin Glargine (LANTUS SOLOSTAR) 100 UNIT/ML Solostar Pen Inject 50 Units into the skin every morning. And pen needles 1/day 10 pen PRN  . L-Methylfolate-B6-B12 (METANX) 3-35-2 MG TABS Take by mouth 2 (two) times daily.      Marland Kitchen lisinopril (PRINIVIL,ZESTRIL) 2.5 MG tablet Take 1 tablet (2.5 mg total) by mouth daily. 90 tablet 3  . Multiple Vitamins-Minerals (CENTRUM SILVER ULTRA MENS PO) Take by mouth daily.      Glory Rosebush DELICA LANCETS 79K MISC Use to check blood sugar 2 times per day. 200 each 2  . Potassium 99 MG  TABS Take by mouth daily.      . simvastatin (ZOCOR) 80 MG tablet Take 1 tablet (80 mg total) by mouth every evening. 100 tablet 3  . traMADol (ULTRAM) 50 MG tablet 1/2-1 tablet at bedtime when necessary for severe pain 30 tablet 0   No current facility-administered medications on file prior to visit.     No Known Allergies  Family History  Problem Relation Age of Onset  . Alcohol abuse Father   . Diabetes Sister     BP 122/80   Pulse 61   Ht 6' 2"  (1.88 m)   Wt 211 lb (95.7 kg)   SpO2 97%   BMI 27.09 kg/m    Review of Systems He denies hypoglycemia.      Objective:   Physical Exam VITAL SIGNS:  See vs page.  GENERAL: no distress.  Pulses: dorsalis pedis intact bilat.   MSK: no deformity of the feet CV: no leg edema Skin:  no ulcer on the feet.  normal color and temp on the feet. Neuro: sensation is intact to touch on the feet, but decreased from  normal   A1c=9.8%    Assessment & Plan:  Type 1 DM, with polyneuropathy, worse.  He needs a faster-acting QD insulin. Noncompliance with cbg recording.  We discussed the need to vary time of day.   Patient Instructions  check your blood sugar twice a day.  vary the time of day when you check, between before the 3 meals, and at bedtime.  also check if you have symptoms of your blood sugar being too high or too low.  please keep a record of the readings and bring it to your next appointment here (or you can bring the meter itself).  You can write it on any piece of paper.  please call us sooner if your blood sugar goes below 70, or if you have a lot of readings over 200.  Please change the lantus to "levemir," 70 units each morning.   Please come back for a follow-up appointment in 2 months.

## 2017-06-23 NOTE — Patient Instructions (Addendum)
check your blood sugar twice a day.  vary the time of day when you check, between before the 3 meals, and at bedtime.  also check if you have symptoms of your blood sugar being too high or too low.  please keep a record of the readings and bring it to your next appointment here (or you can bring the meter itself).  You can write it on any piece of paper.  please call us sooner if your blood sugar goes below 70, or if you have a lot of readings over 200.  Please change the lantus to "levemir," 70 units each morning.   Please come back for a follow-up appointment in 2 months.

## 2017-07-03 ENCOUNTER — Telehealth: Payer: Self-pay | Admitting: Endocrinology

## 2017-07-03 MED ORDER — INSULIN ISOPHANE HUMAN 100 UNIT/ML KWIKPEN: 50.0000 [IU] | PEN_INJECTOR | SUBCUTANEOUS | 11 refills | Status: DC

## 2017-07-03 NOTE — Telephone Encounter (Signed)
Ok, I have sent a prescription to your pharmacy, to change to the NPH pen.  If ins does not cover, next step is to draw NPH from a vial.  I'll see you next time.

## 2017-07-03 NOTE — Telephone Encounter (Signed)
levemir is $800 that is too expensive, what is an alternate, insurance company did not offer one

## 2017-07-04 MED ORDER — INSULIN NPH (HUMAN) (ISOPHANE) 100 UNIT/ML ~~LOC~~ SUSP: SUBCUTANEOUS | 2 refills | Status: DC

## 2017-07-04 NOTE — Telephone Encounter (Signed)
I contacted the patient and advised via voicemail of MD's instructions. Requested a call back if patient would like to discuss further.

## 2017-07-04 NOTE — Telephone Encounter (Signed)
novolin n is ok--same dosage

## 2017-07-04 NOTE — Telephone Encounter (Signed)
Pharmacy sent a message back stating the humulin N is not a covered product. Novo Nor disk products are preferred.

## 2017-07-10 ENCOUNTER — Other Ambulatory Visit: Payer: Self-pay

## 2017-07-10 ENCOUNTER — Telehealth: Payer: Self-pay

## 2017-07-10 NOTE — Telephone Encounter (Signed)
Novolin N prescription was sent over 7/20.

## 2017-07-15 ENCOUNTER — Telehealth: Payer: Self-pay | Admitting: Endocrinology

## 2017-07-15 MED ORDER — INSULIN NPH (HUMAN) (ISOPHANE) 100 UNIT/ML ~~LOC~~ SUSP: SUBCUTANEOUS | 2 refills | Status: DC

## 2017-07-15 MED ORDER — "INSULIN SYRINGE 31G X 5/16"" 1 ML MISC": 2 refills | Status: DC

## 2017-07-15 NOTE — Telephone Encounter (Signed)
Refill submitted for insulin and insulin syringes.

## 2017-07-15 NOTE — Telephone Encounter (Signed)
**  Remind patient they can make refill requests via MyChart**  Medication refill request (Name & Dosage): Needs needles for insulin NPH Human (NOVOLIN N RELION) 100 UNIT/ML injection   Preferred pharmacy (Name & Address): [facility] [place], [place] [phone] (Phone) [phone] (Fax)       Other comments (if applicable):   Patient prefers the smaller needles if possible.

## 2017-07-17 ENCOUNTER — Telehealth: Payer: Self-pay

## 2017-07-17 NOTE — Telephone Encounter (Signed)
Patient is on the list for [employer] 2018 and may be a good candidate for an AWV. Please let me know if/when appt is scheduled.   

## 2017-08-04 DIAGNOSIS — H524 Presbyopia: Secondary | ICD-10-CM | POA: Diagnosis not present

## 2017-08-04 DIAGNOSIS — H5203 Hypermetropia, bilateral: Secondary | ICD-10-CM | POA: Diagnosis not present

## 2017-08-04 DIAGNOSIS — H401131 Primary open-angle glaucoma, bilateral, mild stage: Secondary | ICD-10-CM | POA: Diagnosis not present

## 2017-08-04 DIAGNOSIS — H52223 Regular astigmatism, bilateral: Secondary | ICD-10-CM | POA: Diagnosis not present

## 2017-08-04 DIAGNOSIS — E119 Type 2 diabetes mellitus without complications: Secondary | ICD-10-CM | POA: Diagnosis not present

## 2017-08-13 ENCOUNTER — Ambulatory Visit (INDEPENDENT_AMBULATORY_CARE_PROVIDER_SITE_OTHER): Payer: Medicare HMO | Admitting: Endocrinology

## 2017-08-13 ENCOUNTER — Encounter: Payer: Self-pay | Admitting: Endocrinology

## 2017-08-13 VITALS — BP 128/82 | HR 70 | Wt 210.0 lb

## 2017-08-13 DIAGNOSIS — E108 Type 1 diabetes mellitus with unspecified complications: Secondary | ICD-10-CM | POA: Diagnosis not present

## 2017-08-13 DIAGNOSIS — E1065 Type 1 diabetes mellitus with hyperglycemia: Secondary | ICD-10-CM

## 2017-08-13 DIAGNOSIS — E1142 Type 2 diabetes mellitus with diabetic polyneuropathy: Secondary | ICD-10-CM | POA: Diagnosis not present

## 2017-08-13 DIAGNOSIS — IMO0002 Reserved for concepts with insufficient information to code with codable children: Secondary | ICD-10-CM

## 2017-08-13 DIAGNOSIS — Z794 Long term (current) use of insulin: Secondary | ICD-10-CM

## 2017-08-13 MED ORDER — INSULIN NPH (HUMAN) (ISOPHANE) 100 UNIT/ML ~~LOC~~ SUSP: SUBCUTANEOUS | 2 refills | Status: DC

## 2017-08-13 NOTE — Patient Instructions (Signed)
Please discard your current bottle of insulin, and get a new one.  Each can stay at room temperature x 10 days.  You can stretch that by keeping it in the refrigerator.  However, it should be warmed up to room temperature before each day's insulin is drawn up.   Please take 20 more units now.  Then tomorrow morning, start taking 50 units each morning.  Please call or message [place] in 2 days, to tell us how the blood sugar is doing.  I'll see you next time.

## 2017-08-13 NOTE — Progress Notes (Signed)
Subjective:    Patient ID: Christopher [name], male    DOB: 1946/10/06, 71 y.o.   MRN: [id number]  HPI Pt returns for f/u of diabetes mellitus: DM type: Insulin-requiring type 2 (but he is presumed to be developing type 1, due to lean body habitus and neg FHx).   Dx'ed: 2010.   Complications: polyneuropathy.    Therapy: insulin since 2013.   DKA: never.   Severe hypoglycemia: last episode was in early 2016.   Pancreatitis: never.  Other: he takes QD insulin, after noncompliance with multiple daily injections.  Interval history: He says he never misses the insulin.  He says on the NPH, cbg's were well-controlled until the past few days.  Then it increased to 300-400.  He feels this is not due to any injection problems.  No recent steroids or illness.  However, he wonders if this is a problem with leaving the insulin at room temperature x 3 weeks.   Past Medical History:  Diagnosis Date  . Diabetes mellitus type II   . GERD (gastroesophageal reflux disease)   . Glaucoma   . Hyperlipidemia     Past Surgical History:  Procedure Laterality Date  . HERNIA REPAIR    . [place]    Social History   Social History  . Marital status: Married    Spouse name: N/A  . Number of children: N/A  . Years of education: N/A   Occupational History  . Not on file.   Social History Main Topics  . Smoking status: Never Smoker  . Smokeless tobacco: Never Used  . Alcohol use No  . Drug use: No  . Sexual activity: Not on file   Other Topics Concern  . Not on file   Social History Narrative  . No narrative on file    Current Outpatient Prescriptions on File Prior to Visit  Medication Sig Dispense Refill  . Alcohol Swabs (B-D SINGLE USE SWABS REGULAR) PADS USE TO CHECK BLOOD SUGAR 2 TIMES PER DAY 200 each 3  . ALPHAGAN P 0.1 % SOLN     . aspirin EC 81 MG EC tablet Take 81 mg by mouth daily.      . Blood Glucose Calibration (ACCU-CHEK SMARTVIEW CONTROL) LIQD 1 each by  In Vitro route as directed. 1 each 3  . Blood Glucose Monitoring Suppl (ACCU-CHEK NANO SMARTVIEW) W/DEVICE KIT 1 kit by Does not apply route once. 1 kit 0  . dorzolamide-timolol (COSOPT) 22.3-6.8 MG/ML ophthalmic solution     . fish oil-omega-3 fatty acids 1000 MG capsule Take 2 g by mouth daily.      . Flax OIL Take by mouth daily.      Marland Kitchen glucagon 1 MG injection Inject 1 mg into the muscle once as needed. (Patient taking differently: Inject 1 mg into the muscle daily. ) 1 each 12  . glucose blood (ONE TOUCH ULTRA TEST) test strip Use to check blood sugar 2 times daily. 200 each 2  . hydrochlorothiazide (HYDRODIURIL) 25 MG tablet Take 1 tablet (25 mg total) by mouth daily. 90 tablet 3  . Insulin Syringe-Needle U-100 (INSULIN SYRINGE 1CC/31GX5/16") 31G X 5/16" 1 ML MISC Use to inject insulin 1 time per day. 100 each 2  . L-Methylfolate-B6-B12 (METANX) 3-35-2 MG TABS Take by mouth 2 (two) times daily.      Marland Kitchen lisinopril (PRINIVIL,ZESTRIL) 2.5 MG tablet Take 1 tablet (2.5 mg total) by mouth daily. 90 tablet 3  . Multiple Vitamins-Minerals (  CENTRUM SILVER ULTRA MENS PO) Take by mouth daily.      Glory Rosebush DELICA LANCETS 87X MISC Use to check blood sugar 2 times per day. 200 each 2  . Potassium 99 MG TABS Take by mouth daily.      . simvastatin (ZOCOR) 80 MG tablet Take 1 tablet (80 mg total) by mouth every evening. 100 tablet 3  . traMADol (ULTRAM) 50 MG tablet 1/2-1 tablet at bedtime when necessary for severe pain 30 tablet 0   No current facility-administered medications on file prior to visit.     No Known Allergies  Family History  Problem Relation Age of Onset  . Alcohol abuse Father   . Diabetes Sister     BP 128/82 (BP Location: Left Arm, Patient Position: Sitting)   Pulse 70   Wt 210 lb (95.3 kg)   BMI 26.96 kg/m    Review of Systems He denies hypoglycemia/n/v    Objective:   Physical Exam VITAL SIGNS:  See vs page GENERAL: no distress Pulses: foot pulses are intact  bilaterally.   MSK: no deformity of the feet or ankles.  CV: no edema of the legs or ankles.  Skin:  no ulcer on the feet or ankles.  normal color and temp on the feet and ankles.  Neuro: sensation is intact to touch on the feet and ankles, but decreased from normal.     Lab Results  Component Value Date   HGBA1C 9.8 06/23/2017      Assessment & Plan:  Insulin-requiring type 2 DM, with polyneuropathy: worse.   Patient Instructions  Please discard your current bottle of insulin, and get a new one.  Each can stay at room temperature x 10 days.  You can stretch that by keeping it in the refrigerator.  However, it should be warmed up to room temperature before each day's insulin is drawn up.   Please take 20 more units now.  Then tomorrow morning, start taking 50 units each morning.  Please call or message [place] in 2 days, to tell us how the blood sugar is doing.  I'll see you next time.

## 2017-08-19 ENCOUNTER — Telehealth: Payer: Self-pay | Admitting: Endocrinology

## 2017-08-19 DIAGNOSIS — E108 Type 1 diabetes mellitus with unspecified complications: Secondary | ICD-10-CM | POA: Insufficient documentation

## 2017-08-19 DIAGNOSIS — E1122 Type 2 diabetes mellitus with diabetic chronic kidney disease: Secondary | ICD-10-CM | POA: Insufficient documentation

## 2017-08-19 NOTE — Telephone Encounter (Signed)
Please continue the same insulin I'll see you next time.   

## 2017-08-19 NOTE — Telephone Encounter (Signed)
Routing to you °

## 2017-08-19 NOTE — Telephone Encounter (Signed)
Patient wanted to give Dr. Loanne Drilling an update on new medication novolin N relion. He states that it is keeping his level lower than the last insulin. He also states not too low.

## 2017-08-25 ENCOUNTER — Ambulatory Visit (INDEPENDENT_AMBULATORY_CARE_PROVIDER_SITE_OTHER): Payer: Medicare HMO | Admitting: Endocrinology

## 2017-08-25 ENCOUNTER — Encounter: Payer: Self-pay | Admitting: Endocrinology

## 2017-08-25 VITALS — BP 118/73 | HR 64 | Ht 74.0 in | Wt 208.0 lb

## 2017-08-25 DIAGNOSIS — E1142 Type 2 diabetes mellitus with diabetic polyneuropathy: Secondary | ICD-10-CM

## 2017-08-25 DIAGNOSIS — Z794 Long term (current) use of insulin: Secondary | ICD-10-CM

## 2017-08-25 LAB — POCT GLYCOSYLATED HEMOGLOBIN (HGB A1C): Hemoglobin A1C: 10

## 2017-08-25 MED ORDER — INSULIN NPH (HUMAN) (ISOPHANE) 100 UNIT/ML ~~LOC~~ SUSP: 45.0000 [IU] | SUBCUTANEOUS | 2 refills | Status: DC

## 2017-08-25 NOTE — Progress Notes (Signed)
Subjective:    Patient ID: Christopher [name], male    DOB: 20-Aug-1946, 71 y.o.   MRN: [id number]  HPI Pt returns for f/u of diabetes mellitus: DM type: Insulin-requiring type 2 (but he is presumed to be developing type 1, due to lean body habitus and neg FHx).   Dx'ed: 2010.   Complications: polyneuropathy.    Therapy: insulin since 2013.   DKA: never.   Severe hypoglycemia: last episode was in 2018.   Pancreatitis: never.  Other: he takes QD insulin, after noncompliance with multiple daily injections; he  Could not take lantus, due to AM hypoglycemia and PM hyperglycemia.  Interval history: 2 weeks ago, he had an episode of severe hypoglycemia.  this happened after he missed lunch, and was active.  He takes insulin as rx'ed.  no cbg record, but states cbg's are in the mid to high-100's. He checks fasting only.  Past Medical History:  Diagnosis Date  . Diabetes mellitus type II   . GERD (gastroesophageal reflux disease)   . Glaucoma   . Hyperlipidemia     Past Surgical History:  Procedure Laterality Date  . HERNIA REPAIR    . [place]    Social History   Social History  . Marital status: Married    Spouse name: N/A  . Number of children: N/A  . Years of education: N/A   Occupational History  . Not on file.   Social History Main Topics  . Smoking status: Never Smoker  . Smokeless tobacco: Never Used  . Alcohol use No  . Drug use: No  . Sexual activity: Not on file   Other Topics Concern  . Not on file   Social History Narrative  . No narrative on file    Current Outpatient Prescriptions on File Prior to Visit  Medication Sig Dispense Refill  . Alcohol Swabs (B-D SINGLE USE SWABS REGULAR) PADS USE TO CHECK BLOOD SUGAR 2 TIMES PER DAY 200 each 3  . ALPHAGAN P 0.1 % SOLN     . aspirin EC 81 MG EC tablet Take 81 mg by mouth daily.      . Blood Glucose Calibration (ACCU-CHEK SMARTVIEW CONTROL) LIQD 1 each by In Vitro route as directed. 1 each  3  . Blood Glucose Monitoring Suppl (ACCU-CHEK NANO SMARTVIEW) W/DEVICE KIT 1 kit by Does not apply route once. 1 kit 0  . dorzolamide-timolol (COSOPT) 22.3-6.8 MG/ML ophthalmic solution     . fish oil-omega-3 fatty acids 1000 MG capsule Take 2 g by mouth daily.      . Flax OIL Take by mouth daily.      Marland Kitchen glucagon 1 MG injection Inject 1 mg into the muscle once as needed. (Patient taking differently: Inject 1 mg into the muscle daily. ) 1 each 12  . glucose blood (ONE TOUCH ULTRA TEST) test strip Use to check blood sugar 2 times daily. 200 each 2  . hydrochlorothiazide (HYDRODIURIL) 25 MG tablet Take 1 tablet (25 mg total) by mouth daily. 90 tablet 3  . Insulin Syringe-Needle U-100 (INSULIN SYRINGE 1CC/31GX5/16") 31G X 5/16" 1 ML MISC Use to inject insulin 1 time per day. 100 each 2  . L-Methylfolate-B6-B12 (METANX) 3-35-2 MG TABS Take by mouth 2 (two) times daily.      Marland Kitchen lisinopril (PRINIVIL,ZESTRIL) 2.5 MG tablet Take 1 tablet (2.5 mg total) by mouth daily. 90 tablet 3  . Multiple Vitamins-Minerals (CENTRUM SILVER ULTRA MENS PO) Take by mouth daily.      Marland Kitchen  ONETOUCH DELICA LANCETS 56F MISC Use to check blood sugar 2 times per day. 200 each 2  . Potassium 99 MG TABS Take by mouth daily.      . simvastatin (ZOCOR) 80 MG tablet Take 1 tablet (80 mg total) by mouth every evening. 100 tablet 3  . traMADol (ULTRAM) 50 MG tablet 1/2-1 tablet at bedtime when necessary for severe pain 30 tablet 0   No current facility-administered medications on file prior to visit.     No Known Allergies  Family History  Problem Relation Age of Onset  . Alcohol abuse Father   . Diabetes Sister     BP 118/73   Pulse 64   Ht 6' 2"  (1.88 m)   Wt 208 lb (94.3 kg)   BMI 26.71 kg/m    Review of Systems No weight change.      Objective:   Physical Exam VITAL SIGNS:  See vs page GENERAL: no distress Pulses: dorsalis pedis intact bilat.   MSK: no deformity of the feet CV: no leg edema.  Skin:  no ulcer  on the feet.  normal color and temp on the feet.  Neuro: sensation is intact to touch on the feet, but decreased from normal.        Assessment & Plan:  Insulin-requiring type 2 DM, with polyneuropathy: overcontrolled Hypoglycemia: worse.  He is not a candidate for aggressive glycemic control Noncompliance with cbg recording: this complicates the rx of DM  Patient Instructions  check your blood sugar twice a day.  vary the time of day when you check, between before the 3 meals, and at bedtime.  also check if you have symptoms of your blood sugar being too high or too low.  please keep a record of the readings and bring it to your next appointment here (or you can bring the meter itself).  You can write it on any piece of paper.  please call us sooner if your blood sugar goes below 70, or if you have a lot of readings over 200.   Please reduce the insulin to 45 units each morning.  On this type of insulin schedule, you should eat meals on a regular schedule.  If a meal is missed or significantly delayed, your blood sugar could go low.   Please come back for a follow-up appointment in 2 months.

## 2017-08-25 NOTE — Patient Instructions (Addendum)
check your blood sugar twice a day.  vary the time of day when you check, between before the 3 meals, and at bedtime.  also check if you have symptoms of your blood sugar being too high or too low.  please keep a record of the readings and bring it to your next appointment here (or you can bring the meter itself).  You can write it on any piece of paper.  please call us sooner if your blood sugar goes below 70, or if you have a lot of readings over 200.   Please reduce the insulin to 45 units each morning.  On this type of insulin schedule, you should eat meals on a regular schedule.  If a meal is missed or significantly delayed, your blood sugar could go low.   Please come back for a follow-up appointment in 2 months.

## 2017-09-19 ENCOUNTER — Encounter: Payer: Self-pay | Admitting: Gastroenterology

## 2017-10-27 ENCOUNTER — Encounter: Payer: Self-pay | Admitting: Endocrinology

## 2017-10-27 ENCOUNTER — Ambulatory Visit (INDEPENDENT_AMBULATORY_CARE_PROVIDER_SITE_OTHER): Payer: Medicare HMO | Admitting: Endocrinology

## 2017-10-27 VITALS — BP 118/74 | HR 59 | Wt 207.6 lb

## 2017-10-27 DIAGNOSIS — Z794 Long term (current) use of insulin: Secondary | ICD-10-CM | POA: Diagnosis not present

## 2017-10-27 DIAGNOSIS — Z23 Encounter for immunization: Secondary | ICD-10-CM

## 2017-10-27 DIAGNOSIS — E1142 Type 2 diabetes mellitus with diabetic polyneuropathy: Secondary | ICD-10-CM

## 2017-10-27 LAB — POCT GLYCOSYLATED HEMOGLOBIN (HGB A1C): Hemoglobin A1C: 10.2

## 2017-10-27 MED ORDER — INSULIN ASPART 100 UNIT/ML FLEXPEN: PEN_INJECTOR | SUBCUTANEOUS | 11 refills | Status: DC

## 2017-10-27 NOTE — Progress Notes (Signed)
Subjective:    Patient ID: Christopher [name], male    DOB: [birth year], 71 y.o.   MRN: [id number]  HPI Pt returns for f/u of diabetes mellitus: DM type: Insulin-requiring type 2 (but he is presumed to be developing type 1, due to lean body habitus and neg FHx).   Dx'ed: 2010.   Complications: polyneuropathy.    Therapy: insulin since 2013.   DKA: never.   Severe hypoglycemia: last episode was in 2018.   Pancreatitis: never.  Other: he takes QD insulin, after noncompliance with multiple daily injections; he could not take lantus, due to AM hypoglycemia and PM hyperglycemia.  Interval history: no cbg record, but states cbg's vary from 110-300.  It is lowest at lunch.  pt states he feels well in general.  Past Medical History:  Diagnosis Date  . Diabetes mellitus type II   . GERD (gastroesophageal reflux disease)   . Glaucoma   . Hyperlipidemia     Past Surgical History:  Procedure Laterality Date  . HERNIA REPAIR    . [place]    Social History   Socioeconomic History  . Marital status: Married    Spouse name: Not on file  . Number of children: Not on file  . Years of education: Not on file  . Highest education level: Not on file  Social Needs  . Financial resource strain: Not on file  . Food insecurity - worry: Not on file  . Food insecurity - inability: Not on file  . Transportation needs - medical: Not on file  . Transportation needs - non-medical: Not on file  Occupational History  . Not on file  Tobacco Use  . Smoking status: Never Smoker  . Smokeless tobacco: Never Used  Substance and Sexual Activity  . Alcohol use: No  . Drug use: No  . Sexual activity: Not on file  Other Topics Concern  . Not on file  Social History Narrative  . Not on file    Current Outpatient Medications on File Prior to Visit  Medication Sig Dispense Refill  . Alcohol Swabs (B-D SINGLE USE SWABS REGULAR) PADS USE TO CHECK BLOOD SUGAR 2 TIMES PER DAY 200 each 3    . ALPHAGAN P 0.1 % SOLN     . aspirin EC 81 MG EC tablet Take 81 mg by mouth daily.      . Blood Glucose Calibration (ACCU-CHEK SMARTVIEW CONTROL) LIQD 1 each by In Vitro route as directed. 1 each 3  . Blood Glucose Monitoring Suppl (ACCU-CHEK NANO SMARTVIEW) W/DEVICE KIT 1 kit by Does not apply route once. 1 kit 0  . dorzolamide-timolol (COSOPT) 22.3-6.8 MG/ML ophthalmic solution     . fish oil-omega-3 fatty acids 1000 MG capsule Take 2 g by mouth daily.      . Flax OIL Take by mouth daily.      Marland Kitchen glucagon 1 MG injection Inject 1 mg into the muscle once as needed. (Patient taking differently: Inject 1 mg into the muscle daily. ) 1 each 12  . glucose blood (ONE TOUCH ULTRA TEST) test strip Use to check blood sugar 2 times daily. 200 each 2  . hydrochlorothiazide (HYDRODIURIL) 25 MG tablet Take 1 tablet (25 mg total) by mouth daily. 90 tablet 3  . Insulin Syringe-Needle U-100 (INSULIN SYRINGE 1CC/31GX5/16") 31G X 5/16" 1 ML MISC Use to inject insulin 1 time per day. 100 each 2  . L-Methylfolate-B6-B12 (METANX) 3-35-2 MG TABS Take by mouth 2 (  two) times daily.      . lisinopril (PRINIVIL,ZESTRIL) 2.5 MG tablet Take 1 tablet (2.5 mg total) by mouth daily. 90 tablet 3  . Multiple Vitamins-Minerals (CENTRUM SILVER ULTRA MENS PO) Take by mouth daily.      . ONETOUCH DELICA LANCETS 33G MISC Use to check blood sugar 2 times per day. 200 each 2  . Potassium 99 MG TABS Take by mouth daily.      . simvastatin (ZOCOR) 80 MG tablet Take 1 tablet (80 mg total) by mouth every evening. 100 tablet 3  . traMADol (ULTRAM) 50 MG tablet 1/2-1 tablet at bedtime when necessary for severe pain 30 tablet 0   No current facility-administered medications on file prior to visit.     No Known Allergies  Family History  Problem Relation Age of Onset  . Alcohol abuse Father   . Diabetes Sister     BP 118/74 (BP Location: Left Arm, Patient Position: Sitting, Cuff Size: Normal)   Pulse (!) 59   Wt 207 lb 9.6 oz  (94.2 kg)   SpO2 96%   BMI 26.65 kg/m    Review of Systems He denies hypoglycemia    Objective:   Physical Exam VITAL SIGNS:  See vs page GENERAL: no distress Pulses: dorsalis pedis intact bilat.   MSK: no deformity of the feet CV: no leg edema.  Skin:  no ulcer on the feet.  normal color and temp on the feet.  Neuro: sensation is intact to touch on the feet, but decreased from normal.     Lab Results  Component Value Date   HGBA1C 10.2 10/27/2017      Assessment & Plan:  Insulin-requiring type 2 DM: poor glycemic control. We discussed. we should go back to multiple daily injections.    Patient Instructions  check your blood sugar twice a day.  vary the time of day when you check, between before the 3 meals, and at bedtime.  also check if you have symptoms of your blood sugar being too high or too low.  please keep a record of the readings and bring it to your next appointment here (or you can bring the meter itself).  You can write it on any piece of paper.  please call us sooner if your blood sugar goes below 70, or if you have a lot of readings over 200.   Please change the insulin back to novolog, 3 times a day (just before each meal) 11-22-11 units.  I have sent a prescription to your pharmacy. On this type of insulin schedule, you should eat meals on a regular schedule.  If a meal is missed or significantly delayed, your blood sugar could go low.   Please come back for a follow-up appointment in 2 months.      

## 2017-10-27 NOTE — Patient Instructions (Addendum)
check your blood sugar twice a day.  vary the time of day when you check, between before the 3 meals, and at bedtime.  also check if you have symptoms of your blood sugar being too high or too low.  please keep a record of the readings and bring it to your next appointment here (or you can bring the meter itself).  You can write it on any piece of paper.  please call us sooner if your blood sugar goes below 70, or if you have a lot of readings over 200.   Please change the insulin back to novolog, 3 times a day (just before each meal) 11-22-11 units.  I have sent a prescription to your pharmacy. On this type of insulin schedule, you should eat meals on a regular schedule.  If a meal is missed or significantly delayed, your blood sugar could go low.   Please come back for a follow-up appointment in 2 months.

## 2017-12-03 DIAGNOSIS — H401131 Primary open-angle glaucoma, bilateral, mild stage: Secondary | ICD-10-CM | POA: Diagnosis not present

## 2017-12-04 ENCOUNTER — Telehealth: Payer: Self-pay | Admitting: Endocrinology

## 2017-12-04 MED ORDER — INSULIN NPH (HUMAN) (ISOPHANE) 100 UNIT/ML ~~LOC~~ SUSP: 45.0000 [IU] | SUBCUTANEOUS | 2 refills | Status: DC

## 2017-12-04 NOTE — Telephone Encounter (Signed)
Ok to go back to NPH, 50 units qam.  I have sent a prescription to your pharmacy.  I'll see you next time.

## 2017-12-04 NOTE — Telephone Encounter (Signed)
I called and clarified with [facility] letting them know that prescription was supposed to be for 50 units qam.

## 2017-12-04 NOTE — Telephone Encounter (Signed)
Patient called pharmacy to make sure script for his insulin had been sent. Pharmacy told him the script had 2 different sets of instructions. Please call [facility] on [address] to clarify instructions.

## 2017-12-04 NOTE — Telephone Encounter (Signed)
Patient is here. He started Novalog pen his blood was 370 this morning. Has been having weird dreams. Patient wants to go back to his previous medication. [facility] [address]. Patient's ph# [phone]

## 2017-12-04 NOTE — Telephone Encounter (Signed)
I called and left detailed VM that proscription was called into pharmacy. If he had further questions he could call back.

## 2017-12-25 ENCOUNTER — Other Ambulatory Visit: Payer: Self-pay

## 2017-12-30 ENCOUNTER — Other Ambulatory Visit: Payer: Self-pay

## 2018-01-01 ENCOUNTER — Telehealth: Payer: Self-pay

## 2018-01-01 ENCOUNTER — Other Ambulatory Visit: Payer: Self-pay

## 2018-01-01 MED ORDER — BD SWAB SINGLE USE REGULAR PADS: MEDICATED_PAD | 3 refills | Status: DC

## 2018-01-01 NOTE — Telephone Encounter (Signed)
Christopher [name] from [facility] called checking on the status of a prescription for this patient- he stated he has faxed over requests for BD single use alcohol swabs and the smart view control solution- can you please call [phone] to let [facility] know where we are with these prescriptions

## 2018-01-01 NOTE — Telephone Encounter (Signed)
I have sent to [facility].

## 2018-01-07 ENCOUNTER — Other Ambulatory Visit: Payer: Self-pay

## 2018-01-07 MED ORDER — ACCU-CHEK NANO SMARTVIEW W/DEVICE KIT: 1.0000 | PACK | Freq: Once | 0 refills | Status: AC

## 2018-01-07 MED ORDER — ACCU-CHEK SMARTVIEW CONTROL VI LIQD: 1.0000 | 99 refills | Status: DC | PRN

## 2018-01-07 MED ORDER — BD SWAB SINGLE USE REGULAR PADS: MEDICATED_PAD | 99 refills | Status: DC

## 2018-01-07 MED ORDER — GLUCOSE BLOOD VI STRP: ORAL_STRIP | 12 refills | Status: DC

## 2018-01-27 ENCOUNTER — Encounter: Payer: Self-pay | Admitting: Endocrinology

## 2018-01-27 ENCOUNTER — Ambulatory Visit (INDEPENDENT_AMBULATORY_CARE_PROVIDER_SITE_OTHER): Payer: Medicare HMO | Admitting: Endocrinology

## 2018-01-27 VITALS — BP 98/60 | HR 69 | Wt 206.4 lb

## 2018-01-27 DIAGNOSIS — E1142 Type 2 diabetes mellitus with diabetic polyneuropathy: Secondary | ICD-10-CM

## 2018-01-27 DIAGNOSIS — Z794 Long term (current) use of insulin: Secondary | ICD-10-CM

## 2018-01-27 LAB — POCT GLYCOSYLATED HEMOGLOBIN (HGB A1C): HEMOGLOBIN A1C: 11.6

## 2018-01-27 MED ORDER — GLUCOSE BLOOD VI STRP: ORAL_STRIP | 12 refills | Status: DC

## 2018-01-27 MED ORDER — INSULIN NPH (HUMAN) (ISOPHANE) 100 UNIT/ML ~~LOC~~ SUSP: 55.0000 [IU] | SUBCUTANEOUS | 2 refills | Status: DC

## 2018-01-27 NOTE — Patient Instructions (Addendum)
check your blood sugar twice a day.  vary the time of day when you check, between before the 3 meals, and at bedtime.  also check if you have symptoms of your blood sugar being too high or too low.  please keep a record of the readings and bring it to your next appointment here (or you can bring the meter itself).  You can write it on any piece of paper.  please call us sooner if your blood sugar goes below 70, or if you have a lot of readings over 200.   Please increase the NPH insulin to 55 units each morning.   On this type of insulin schedule, you should eat meals on a regular schedule.  If a meal is missed or significantly delayed, your blood sugar could go low.   Please come back for a follow-up appointment in 2 months.

## 2018-01-27 NOTE — Progress Notes (Signed)
Subjective:    Patient ID: Christopher [name], male    DOB: 03/31/1946, 72 y.o.   MRN: [id number]  HPI Pt returns for f/u of diabetes mellitus: DM type: 1 Dx'ed: 2010.   Complications: polyneuropathy.    Therapy: insulin since 2013.   DKA: never.   Severe hypoglycemia: last episode was in 2018.   Pancreatitis: never.  Other: he is back on QD insulin, after noncompliance with multiple daily injections; he could not take lantus, due to AM hypoglycemia and PM hyperglycemia.   Interval history: he takes 50 units qam.  no cbg record, but states cbg's vary from 93-200.  He has not checked recently, as he has run out of strips.   Past Medical History:  Diagnosis Date  . Diabetes mellitus type II   . GERD (gastroesophageal reflux disease)   . Glaucoma   . Hyperlipidemia     Past Surgical History:  Procedure Laterality Date  . HERNIA REPAIR    . [place]    Social History   Socioeconomic History  . Marital status: Married    Spouse name: Not on file  . Number of children: Not on file  . Years of education: Not on file  . Highest education level: Not on file  Social Needs  . Financial resource strain: Not on file  . Food insecurity - worry: Not on file  . Food insecurity - inability: Not on file  . Transportation needs - medical: Not on file  . Transportation needs - non-medical: Not on file  Occupational History  . Not on file  Tobacco Use  . Smoking status: Never Smoker  . Smokeless tobacco: Never Used  Substance and Sexual Activity  . Alcohol use: No  . Drug use: No  . Sexual activity: Not on file  Other Topics Concern  . Not on file  Social History Narrative  . Not on file    Current Outpatient Medications on File Prior to Visit  Medication Sig Dispense Refill  . Alcohol Swabs (B-D SINGLE USE SWABS REGULAR) PADS USE TO CHECK BLOOD SUGAR 2 TIMES PER DAY 200 each prn  . ALPHAGAN P 0.1 % SOLN     . aspirin EC 81 MG EC tablet Take 81 mg by mouth  daily.      . Blood Glucose Calibration (ACCU-CHEK SMARTVIEW CONTROL) LIQD 1 Bottle by In Vitro route as needed. 1 each prn  . dorzolamide-timolol (COSOPT) 22.3-6.8 MG/ML ophthalmic solution     . fish oil-omega-3 fatty acids 1000 MG capsule Take 2 g by mouth daily.      . Flax OIL Take by mouth daily.      Marland Kitchen glucagon 1 MG injection Inject 1 mg into the muscle once as needed. (Patient taking differently: Inject 1 mg into the muscle daily. ) 1 each 12  . hydrochlorothiazide (HYDRODIURIL) 25 MG tablet Take 1 tablet (25 mg total) by mouth daily. 90 tablet 3  . Insulin Syringe-Needle U-100 (INSULIN SYRINGE 1CC/31GX5/16") 31G X 5/16" 1 ML MISC Use to inject insulin 1 time per day. 100 each 2  . L-Methylfolate-B6-B12 (METANX) 3-35-2 MG TABS Take by mouth 2 (two) times daily.      Marland Kitchen lisinopril (PRINIVIL,ZESTRIL) 2.5 MG tablet Take 1 tablet (2.5 mg total) by mouth daily. 90 tablet 3  . Multiple Vitamins-Minerals (CENTRUM SILVER ULTRA MENS PO) Take by mouth daily.      Glory Rosebush DELICA LANCETS 17O MISC Use to check blood sugar 2  times per day. 200 each 2  . Potassium 99 MG TABS Take by mouth daily.      . simvastatin (ZOCOR) 80 MG tablet Take 1 tablet (80 mg total) by mouth every evening. 100 tablet 3  . traMADol (ULTRAM) 50 MG tablet 1/2-1 tablet at bedtime when necessary for severe pain 30 tablet 0   No current facility-administered medications on file prior to visit.     No Known Allergies  Family History  Problem Relation Age of Onset  . Alcohol abuse Father   . Diabetes Sister     BP 98/60 (BP Location: Left Arm, Patient Position: Sitting, Cuff Size: Normal)   Pulse 69   Wt 206 lb 6.4 oz (93.6 kg)   SpO2 96%   BMI 26.50 kg/m    Review of Systems He denies hypoglycemia.      Objective:   Physical Exam VITAL SIGNS:  See vs page.  GENERAL: no distress.  Pulses: foot pulses are intact bilaterally.   MSK: no deformity of the feet or ankles.  CV: no edema of the legs or ankles.    Skin:  no ulcer on the feet or ankles.  normal color and temp on the feet and ankles Neuro: sensation is intact to touch on the feet and ankles, but decreased from normal.   Lab Results  Component Value Date   HGBA1C 11.6 01/27/2018       Assessment & Plan:  Type 1 DM, with polyneuropathy: worse.  Noncompliance with cbg recording: in this setting, we have to increase insulin slowly.    Patient Instructions  check your blood sugar twice a day.  vary the time of day when you check, between before the 3 meals, and at bedtime.  also check if you have symptoms of your blood sugar being too high or too low.  please keep a record of the readings and bring it to your next appointment here (or you can bring the meter itself).  You can write it on any piece of paper.  please call us sooner if your blood sugar goes below 70, or if you have a lot of readings over 200.   Please increase the NPH insulin to 55 units each morning.   On this type of insulin schedule, you should eat meals on a regular schedule.  If a meal is missed or significantly delayed, your blood sugar could go low.   Please come back for a follow-up appointment in 2 months.

## 2018-01-28 ENCOUNTER — Other Ambulatory Visit: Payer: Self-pay

## 2018-01-28 NOTE — Telephone Encounter (Signed)
Received fax from [place] to clarify directions for the insulin Relion Novolin N sent this morning. Please advise

## 2018-01-29 ENCOUNTER — Other Ambulatory Visit: Payer: Self-pay | Admitting: Endocrinology

## 2018-01-29 MED ORDER — INSULIN NPH (HUMAN) (ISOPHANE) 100 UNIT/ML ~~LOC~~ SUSP: 55.0000 [IU] | SUBCUTANEOUS | 2 refills | Status: DC

## 2018-01-29 NOTE — Telephone Encounter (Signed)
I corrected and resent 

## 2018-02-19 ENCOUNTER — Telehealth: Payer: Self-pay | Admitting: Family Medicine

## 2018-02-19 MED ORDER — LISINOPRIL 2.5 MG PO TABS: 2.5000 mg | ORAL_TABLET | Freq: Every day | ORAL | 0 refills | Status: DC

## 2018-02-19 MED ORDER — HYDROCHLOROTHIAZIDE 25 MG PO TABS
25.0000 mg | ORAL_TABLET | Freq: Every day | ORAL | 0 refills | Status: DC
Start: 2018-02-19 — End: 2018-06-01

## 2018-02-19 MED ORDER — SIMVASTATIN 80 MG PO TABS: 80.0000 mg | ORAL_TABLET | Freq: Every evening | ORAL | 0 refills | Status: DC

## 2018-02-19 NOTE — Telephone Encounter (Signed)
Last office visit 03/31/17 PCP: Dr. Sherren Mocha Pharmacy: [facility]  Medications; HCTZ, Lisinopril, and Simvastatin refilled for 90 days; no refills; called pt. and left voice message that he is due for an annual office exam in April.

## 2018-02-19 NOTE — Telephone Encounter (Signed)
Copied from [place] [id number]. Topic: Inquiry >> Feb 19, 2018  9:41 AM Pricilla Handler wrote: Reason for CRM: Patient needs refills of the following medications to be sent to his new Mail Order Pharmacy: Hydrochlorothiazide (HYDRODIURIL) 25 MG tablet, Lisinopril (PRINIVIL,ZESTRIL) 2.5 MG tablet, and Simvastatin (ZOCOR) 80 MG tablet. Patient did call his mail order pharmacy. Patient has a week left of each medication. Patient's preferred pharmacy is [facility], [place] [phone] (Phone) [phone] (Fax).       Thank You!!!

## 2018-02-25 ENCOUNTER — Other Ambulatory Visit: Payer: Self-pay

## 2018-02-25 ENCOUNTER — Telehealth: Payer: Self-pay | Admitting: Endocrinology

## 2018-02-25 MED ORDER — ONETOUCH DELICA LANCETS 33G MISC: 2 refills | Status: DC

## 2018-02-25 NOTE — Telephone Encounter (Signed)
I have sent to [facility].

## 2018-02-25 NOTE — Telephone Encounter (Signed)
Patient need a refill for his lancets, he did not received them with his other supplies. Send them to  [place], [place]

## 2018-03-05 NOTE — Telephone Encounter (Unsigned)
Copied from [place] [phone]. Topic: Quick Communication - Office Called Patient >> Mar 04, 2018  3:35 PM Synthia Innocent wrote: Reason for CRM: patient states someone called him yesterday, I don't see record of that. No message was left

## 2018-03-27 ENCOUNTER — Encounter: Payer: Self-pay | Admitting: Endocrinology

## 2018-03-27 ENCOUNTER — Ambulatory Visit (INDEPENDENT_AMBULATORY_CARE_PROVIDER_SITE_OTHER): Payer: Medicare HMO | Admitting: Endocrinology

## 2018-03-27 VITALS — BP 112/70 | HR 68 | Wt 210.2 lb

## 2018-03-27 DIAGNOSIS — Z794 Long term (current) use of insulin: Secondary | ICD-10-CM | POA: Diagnosis not present

## 2018-03-27 DIAGNOSIS — E1142 Type 2 diabetes mellitus with diabetic polyneuropathy: Secondary | ICD-10-CM

## 2018-03-27 LAB — POCT GLYCOSYLATED HEMOGLOBIN (HGB A1C): HEMOGLOBIN A1C: 10

## 2018-03-27 MED ORDER — INSULIN NPH (HUMAN) (ISOPHANE) 100 UNIT/ML ~~LOC~~ SUSP: 65.0000 [IU] | SUBCUTANEOUS | 11 refills | Status: DC

## 2018-03-27 NOTE — Patient Instructions (Addendum)
check your blood sugar twice a day.  vary the time of day when you check, between before the 3 meals, and at bedtime.  also check if you have symptoms of your blood sugar being too high or too low.  please keep a record of the readings and bring it to your next appointment here (or you can bring the meter itself).  You can write it on any piece of paper.  please call us sooner if your blood sugar goes below 70, or if you have a lot of readings over 200.   Please increase the NPH insulin to 65 units each morning.   On this type of insulin schedule, you should eat meals on a regular schedule.  If a meal is missed or significantly delayed, your blood sugar could go low.   Please come back for a follow-up appointment in 2 months.

## 2018-03-27 NOTE — Progress Notes (Signed)
Subjective:    Patient ID: Christopher [name], male    DOB: 07-02-1946, 72 y.o.   MRN: [id number]  HPI Pt returns for f/u of diabetes mellitus: DM type: 1 Dx'ed: 2010.   Complications: polyneuropathy.    Therapy: insulin since 2013.   DKA: never.   Severe hypoglycemia: last episode was in 2018.   Pancreatitis: never.  Other: he is back on QD insulin, after noncompliance with multiple daily injections; he could not take lantus, due to AM hypoglycemia and PM hyperglycemia.   Interval history: he takes 55 units qam.  no cbg record, but states cbg's vary from 140-200.  pt states he feels well in general.   Past Medical History:  Diagnosis Date  . Diabetes mellitus type II   . GERD (gastroesophageal reflux disease)   . Glaucoma   . Hyperlipidemia     Past Surgical History:  Procedure Laterality Date  . HERNIA REPAIR    . [place]    Social History   Socioeconomic History  . Marital status: Married    Spouse name: Not on file  . Number of children: Not on file  . Years of education: Not on file  . Highest education level: Not on file  Occupational History  . Not on file  Social Needs  . Financial resource strain: Not on file  . Food insecurity:    Worry: Not on file    Inability: Not on file  . Transportation needs:    Medical: Not on file    Non-medical: Not on file  Tobacco Use  . Smoking status: Never Smoker  . Smokeless tobacco: Never Used  Substance and Sexual Activity  . Alcohol use: No  . Drug use: No  . Sexual activity: Not on file  Lifestyle  . Physical activity:    Days per week: Not on file    Minutes per session: Not on file  . Stress: Not on file  Relationships  . Social connections:    Talks on phone: Not on file    Gets together: Not on file    Attends religious service: Not on file    Active member of club or organization: Not on file    Attends meetings of clubs or organizations: Not on file    Relationship status: Not  on file  . Intimate partner violence:    Fear of current or ex partner: Not on file    Emotionally abused: Not on file    Physically abused: Not on file    Forced sexual activity: Not on file  Other Topics Concern  . Not on file  Social History Narrative  . Not on file    Current Outpatient Medications on File Prior to Visit  Medication Sig Dispense Refill  . Alcohol Swabs (B-D SINGLE USE SWABS REGULAR) PADS USE TO CHECK BLOOD SUGAR 2 TIMES PER DAY 200 each prn  . ALPHAGAN P 0.1 % SOLN     . aspirin EC 81 MG EC tablet Take 81 mg by mouth daily.      . Blood Glucose Calibration (ACCU-CHEK SMARTVIEW CONTROL) LIQD 1 Bottle by In Vitro route as needed. 1 each prn  . dorzolamide-timolol (COSOPT) 22.3-6.8 MG/ML ophthalmic solution     . fish oil-omega-3 fatty acids 1000 MG capsule Take 2 g by mouth daily.      . Flax OIL Take by mouth daily.      Marland Kitchen glucagon 1 MG injection Inject 1 mg  into the muscle once as needed. (Patient taking differently: Inject 1 mg into the muscle daily. ) 1 each 12  . glucose blood (ACCU-CHEK GUIDE) test strip Used to check blood sugars two times a day. 100 each 12  . hydrochlorothiazide (HYDRODIURIL) 25 MG tablet Take 1 tablet (25 mg total) by mouth daily. 90 tablet 0  . Insulin Syringe-Needle U-100 (INSULIN SYRINGE 1CC/31GX5/16") 31G X 5/16" 1 ML MISC Use to inject insulin 1 time per day. 100 each 2  . L-Methylfolate-B6-B12 (METANX) 3-35-2 MG TABS Take by mouth 2 (two) times daily.      Marland Kitchen lisinopril (PRINIVIL,ZESTRIL) 2.5 MG tablet Take 1 tablet (2.5 mg total) by mouth daily. 90 tablet 0  . Multiple Vitamins-Minerals (CENTRUM SILVER ULTRA MENS PO) Take by mouth daily.      Glory Rosebush DELICA LANCETS 62I MISC Use to check blood sugar 2 times per day. 200 each 2  . Potassium 99 MG TABS Take by mouth daily.      . simvastatin (ZOCOR) 80 MG tablet Take 1 tablet (80 mg total) by mouth every evening. 90 tablet 0  . traMADol (ULTRAM) 50 MG tablet 1/2-1 tablet at bedtime  when necessary for severe pain 30 tablet 0   No current facility-administered medications on file prior to visit.     No Known Allergies  Family History  Problem Relation Age of Onset  . Alcohol abuse Father   . Diabetes Sister     BP 112/70 (BP Location: Left Arm, Patient Position: Sitting, Cuff Size: Normal)   Pulse 68   Wt 210 lb 3.2 oz (95.3 kg)   SpO2 96%   BMI 26.99 kg/m     Review of Systems He denies hypoglycemia    Objective:   Physical Exam VITAL SIGNS:  See vs page GENERAL: no distress Pulses: dorsalis pedis intact bilat.   MSK: no deformity of the feet CV: no leg edema Skin:  no ulcer on the feet.  normal color and temp on the feet. Neuro: sensation is intact to touch on the feet, but decreased from normal.  Lab Results  Component Value Date   HGBA1C 10.0 03/27/2018       Assessment & Plan:  Insulin-requiring type 2 DM, with polyneuropathy: he needs increased rx.    Patient Instructions  check your blood sugar twice a day.  vary the time of day when you check, between before the 3 meals, and at bedtime.  also check if you have symptoms of your blood sugar being too high or too low.  please keep a record of the readings and bring it to your next appointment here (or you can bring the meter itself).  You can write it on any piece of paper.  please call us sooner if your blood sugar goes below 70, or if you have a lot of readings over 200.   Please increase the NPH insulin to 65 units each morning.   On this type of insulin schedule, you should eat meals on a regular schedule.  If a meal is missed or significantly delayed, your blood sugar could go low.   Please come back for a follow-up appointment in 2 months.

## 2018-05-05 ENCOUNTER — Telehealth: Payer: Self-pay | Admitting: Endocrinology

## 2018-05-05 NOTE — Telephone Encounter (Signed)
Pt is having fasting blood work done on 5/29 with Dr. Sherren Mocha and he is checking to see if he needs to do anything different about his insulin regimen his appt is at 10 am

## 2018-05-06 NOTE — Telephone Encounter (Signed)
I can't think of anything else we need

## 2018-05-07 ENCOUNTER — Encounter (HOSPITAL_COMMUNITY): Payer: Self-pay

## 2018-05-07 ENCOUNTER — Emergency Department (HOSPITAL_COMMUNITY)
Admission: EM | Admit: 2018-05-07 | Discharge: 2018-05-08 | Disposition: A | Discharge status: Home / Self Care | Payer: Medicare HMO | Attending: Emergency Medicine | Admitting: Emergency Medicine

## 2018-05-07 DIAGNOSIS — I6789 Other cerebrovascular disease: Secondary | ICD-10-CM | POA: Diagnosis not present

## 2018-05-07 DIAGNOSIS — Z794 Long term (current) use of insulin: Secondary | ICD-10-CM | POA: Insufficient documentation

## 2018-05-07 DIAGNOSIS — F4489 Other dissociative and conversion disorders: Secondary | ICD-10-CM | POA: Diagnosis not present

## 2018-05-07 DIAGNOSIS — Z79899 Other long term (current) drug therapy: Secondary | ICD-10-CM | POA: Diagnosis not present

## 2018-05-07 DIAGNOSIS — E11649 Type 2 diabetes mellitus with hypoglycemia without coma: Secondary | ICD-10-CM | POA: Diagnosis not present

## 2018-05-07 DIAGNOSIS — E162 Hypoglycemia, unspecified: Secondary | ICD-10-CM

## 2018-05-07 LAB — CBG MONITORING, ED: Glucose-Capillary: 144 mg/dL — ABNORMAL HIGH (ref 65–99)

## 2018-05-07 NOTE — ED Triage Notes (Signed)
Patient BIB EMS for low blood sugar. Patient took his daily insulin shot this AM with a CBG of 138. Patient was mowing this evening and became confused. EMS obtained CBG of 30 and then 42 after OJ and PB sandwich. 12.5 of D10 was then administered by EMS brought his CBG up to 138.

## 2018-05-07 NOTE — ED Notes (Signed)
Bed: WA06 Expected date:  Expected time:  Means of arrival:  Comments: EMS hypoglycemia

## 2018-05-07 NOTE — Telephone Encounter (Signed)
Patient notified of this.

## 2018-05-08 LAB — CBG MONITORING, ED: Glucose-Capillary: 167 mg/dL — ABNORMAL HIGH (ref 65–99)

## 2018-05-08 NOTE — ED Provider Notes (Signed)
[facility] DEPT Provider Note   CSN: [id number] Arrival date & time: 05/07/18  2243     History   Chief Complaint No chief complaint on file.   HPI Christopher [name] is a 72 y.o. male.  The history is provided by the patient and medical records.     72 y.o. F with hx of DM2, GERD, HLP, glaucoma, presenting to the ED for low blood sugar.  State she has been doing very well overall.  This morning he took his regular insulin, sugar was 138.  States he got busy working in the yard and missed lunch.  States afterwards he started feeling lightheaded and wife noticed he was a bit confused.  CBG checked and was 30.  Wife gave him OJ and PB sandwich and improved to 42.  EMS was called, gave him 12.5 of D10 and sugar improved to 138.    Past Medical History:  Diagnosis Date  . Diabetes mellitus type II   . GERD (gastroesophageal reflux disease)   . Glaucoma   . Hyperlipidemia     Patient Active Problem List   Diagnosis Date Noted  . Diabetes ([place]) 08/19/2017  . Screening for cardiovascular condition 03/31/2017  . URINARY FREQUENCY 02/06/2009  . VENOUS INSUFFICIENCY 05/30/2008  . Hyperlipidemia 04/15/2008  . GLAUCOMA ASSOCIATED W/OTH ANT SEGMENT ANOMALIES 04/15/2008  . GERD 04/15/2008    Past Surgical History:  Procedure Laterality Date  . HERNIA REPAIR    . [place] Medications    Prior to Admission medications   Medication Sig Start Date End Date Taking? Authorizing Provider  ALPHAGAN P 0.1 % SOLN Place 1 drop into both eyes daily.  03/03/11  Yes [provider]  aspirin EC 81 MG EC tablet Take 81 mg by mouth daily.     Yes [provider]  dorzolamide-timolol (COSOPT) 22.3-6.8 MG/ML ophthalmic solution Place 1 drop into both eyes 2 (two) times daily.  03/03/11  Yes [provider]  insulin NPH Human (NOVOLIN N RELION) 100 UNIT/ML injection Inject 0.65 mLs (65 Units total) into the  skin every morning. 03/27/18  Yes Renato Shin, MD  Multiple Vitamins-Minerals (CENTRUM SILVER ULTRA MENS PO) Take by mouth daily.     Yes [provider]  simvastatin (ZOCOR) 80 MG tablet Take 1 tablet (80 mg total) by mouth every evening. 02/19/18 05/20/18 Yes Dorena Cookey, MD  Alcohol Swabs (B-D SINGLE USE SWABS REGULAR) PADS USE TO CHECK BLOOD SUGAR 2 TIMES PER DAY 01/07/18   Renato Shin, MD  Blood Glucose Calibration (ACCU-CHEK SMARTVIEW CONTROL) LIQD 1 Bottle by In Vitro route as needed. 01/07/18   Renato Shin, MD  glucagon 1 MG injection Inject 1 mg into the muscle once as needed. Patient taking differently: Inject 1 mg into the muscle daily.  03/12/16   Renato Shin, MD  glucose blood (ACCU-CHEK GUIDE) test strip Used to check blood sugars two times a day. 01/27/18   Renato Shin, MD  hydrochlorothiazide (HYDRODIURIL) 25 MG tablet Take 1 tablet (25 mg total) by mouth daily. 02/19/18   Dorena Cookey, MD  Insulin Syringe-Needle U-100 (INSULIN SYRINGE 1CC/31GX5/16") 31G X 5/16" 1 ML MISC Use to inject insulin 1 time per day. 07/15/17   Renato Shin, MD  lisinopril (PRINIVIL,ZESTRIL) 2.5 MG tablet Take 1 tablet (2.5 mg total) by mouth daily. 02/19/18   Dorena Cookey, MD  [facility] DELICA LANCETS 39J MISC Use  to check blood sugar 2 times per day. 02/25/18   Renato Shin, MD    Family History Family History  Problem Relation Age of Onset  . Alcohol abuse Father   . Diabetes Sister     Social History Social History   Tobacco Use  . Smoking status: Never Smoker  . Smokeless tobacco: Never Used  Substance Use Topics  . Alcohol use: No  . Drug use: No     Allergies   Patient has no known allergies.   Review of Systems Review of Systems  Endocrine:       Low CBG  All other systems reviewed and are negative.    Physical Exam Updated Vital Signs BP 121/84 (BP Location: Right Arm)   Pulse 64   Resp 18   SpO2 100%   Physical Exam  Constitutional: He is oriented  to person, place, and time. He appears well-developed and well-nourished.  Appears very well  HENT:  Head: Normocephalic and atraumatic.  Mouth/Throat: Oropharynx is clear and moist.  Eyes: Pupils are equal, round, and reactive to light. Conjunctivae and EOM are normal.  Neck: Normal range of motion.  Cardiovascular: Normal rate, regular rhythm and normal heart sounds.  Pulmonary/Chest: Effort normal and breath sounds normal. No stridor. No respiratory distress.  Abdominal: Soft. Bowel sounds are normal. There is no tenderness. There is no rebound.  Musculoskeletal: Normal range of motion.  Neurological: He is alert and oriented to person, place, and time.  Awake, alert, fully oriented; moving arms and legs well, speech is clear and goal oriented  Skin: Skin is warm and dry.  Psychiatric: He has a normal mood and affect.  Nursing note and vitals reviewed.    ED Treatments / Results  Labs (all labs ordered are listed, but only abnormal results are displayed) Labs Reviewed  CBG MONITORING, ED - Abnormal; Notable for the following components:      Result Value   Glucose-Capillary 144 (*)    All other components within normal limits  CBG MONITORING, ED - Abnormal; Notable for the following components:   Glucose-Capillary 167 (*)    All other components within normal limits    EKG None  Radiology No results found.  Procedures Procedures (including critical care time)  Medications Ordered in ED Medications - No data to display   Initial Impression / Assessment and Plan / ED Course  I have reviewed the triage vital signs and the nursing notes.  Pertinent labs & imaging results that were available during my care of the patient were reviewed by me and considered in my medical decision making (see chart for details).  72 y.o. M here with episode of hypoglycemia.  Took his regular insulin this morning but got busy working in the yard and forgot to eat lunch.  When returned back  inside CBG was low.  imrpoved with PO juice and sandwich followed by small bolus of D10.  Patient has been observed here for about 2 hours, CBG has remained normal.  He has no complaints and is eager to leave.  Would have preferred to get basic labs, however patient does not want to stay for this.  As there does seem to be clear explanation for his hypoglycemia, will allow discharge.  He understands to monitor glucose closely over the next 24 hours, eat regularly scheduled meals.  Close follow-up with PCP.  Discussed plan with patient and wife, they both acknowledged understanding and agreed with plan of care.  Return precautions given  for new or worsening symptoms.  Final Clinical Impressions(s) / ED Diagnoses   Final diagnoses:  Hypoglycemia    ED Discharge Orders    None       Larene Pickett, PA-C 05/08/18 0033    Veryl Speak, MD 05/08/18 [phone]

## 2018-05-08 NOTE — Discharge Instructions (Signed)
Keep an eye on your blood sugar at home. Follow-up with your primary care doctor. Return to the ED for new or worsening symptoms.

## 2018-05-13 ENCOUNTER — Ambulatory Visit (INDEPENDENT_AMBULATORY_CARE_PROVIDER_SITE_OTHER): Payer: Medicare HMO | Admitting: Family Medicine

## 2018-05-13 ENCOUNTER — Encounter: Payer: Self-pay | Admitting: Family Medicine

## 2018-05-13 VITALS — BP 106/78 | HR 66 | Temp 97.9°F | Ht 74.0 in | Wt 211.0 lb

## 2018-05-13 DIAGNOSIS — E1142 Type 2 diabetes mellitus with diabetic polyneuropathy: Secondary | ICD-10-CM

## 2018-05-13 DIAGNOSIS — Z794 Long term (current) use of insulin: Secondary | ICD-10-CM

## 2018-05-13 NOTE — Patient Instructions (Signed)
If you going to exercise strenuously to either decrease your insulin to 55 units or keep your insulin the same but at a snack before you go outside to exercise.  We'll get you set up for the diabetic nutritionist to look at the dietary issues as we discussed  Follow-up with Dr. Ebony Hail as outlined

## 2018-05-13 NOTE — Progress Notes (Signed)
Christopher [name] is a 72 year old thin type I diabetic who comes in today for evaluation of hypoglycemia  Last week he was out in the heat mowing the yard midafternoon and developed hypoglycemia. He was given some sugar at home but his blood sugar didn't come up. 911 was called EMS came and gave him D10. His initial blood sugar was 30. He was taken the emergency room by the time he got there he was fine.  He has not changed his diet or exercise. Dr. Ebony Hail increase his insulin to 65 units from 55 units in April because his A1c was 10.0%.  His daughter from [place] accompanies him today and wants to discussed diet.  His diuretic was stopped.  BP today normal 106/78 on lisinopril 2.5 mg daily.  Vs BP 106/78 (BP Location: Left Arm, Patient Position: Sitting, Cuff Size: Large)   Pulse 66   Temp 97.9 F (36.6 C) (Oral)   Ht 6\' 2"  (1.88 m)   Wt 211 lb (95.7 kg)   SpO2 98%   BMI 27.09 kg/m  Well-developed well-nourished male no acute distress vital signs stable he is afebrile  Long discussion with patient and daughter about diet exercise medication. We'll get him set up to see the diabetic teaching nurse. There is there seems to be an issue with him not understanding a diabetic diet  #1 diabetes type 1 with spur found hypoglycemic episode blood sugar dropped to 30............Marland Kitchen recommend continue current therapy however if he is going to be doing a lot of physical exertion either cut his insulin to 55 units or keep his insulin the same and have a snack before he mows the grass. We'll also get him set up nutrition center.Marland Kitchen

## 2018-05-21 ENCOUNTER — Encounter: Payer: Medicare HMO | Attending: Endocrinology | Admitting: Registered"

## 2018-05-21 ENCOUNTER — Encounter: Payer: Self-pay | Admitting: Registered"

## 2018-05-21 DIAGNOSIS — E108 Type 1 diabetes mellitus with unspecified complications: Secondary | ICD-10-CM

## 2018-05-21 DIAGNOSIS — Z713 Dietary counseling and surveillance: Secondary | ICD-10-CM | POA: Diagnosis not present

## 2018-05-21 DIAGNOSIS — E1142 Type 2 diabetes mellitus with diabetic polyneuropathy: Secondary | ICD-10-CM | POA: Diagnosis not present

## 2018-05-21 DIAGNOSIS — Z794 Long term (current) use of insulin: Secondary | ICD-10-CM | POA: Insufficient documentation

## 2018-05-21 NOTE — Progress Notes (Signed)
Diabetes Self-Management Education  Visit Type: First/Initial  Appt. Start Time: 1400 Appt. End Time: 1500  05/21/2018  [name], identified by name and date of birth, is a 72 y.o. male with a diagnosis of Diabetes: Type 1.   This patient is accompanied in the office by his spouse.  ASSESSMENT Per chart patient has both T1DM and T2DM diagnosis. Patient states when he was first diagnosed ~2012 they thought it was T2DM but shortly after was told he has T1DM (BMI 27.1).  A1c is 10%. Pt reported FBG was 145-150 mg/dL until recently and now usually less than 100. Pt states he also checks around 3:30 pm (eats at 12) and usually in the 120s.  Pt states he told his doctor he did not want to take insulin 3x day, so he is just on insulin isophane 65 units and was told he cannot skip meals. Pt states he is very active and has had issue with asymptomatic hypoglycemia. Pt states he has passed out before and did not have any idea there was a problem. Patient states he also gets night sweats and was not aware it could be a sign of hypoglycemia.   Pt states he has cut out lasagne, spaghetti, potatoes, white rice, etc. RD briefly discussed Afrezza and potential for expanding his diet. Pt states he was not worried about eating brown rice because he believed he just needed to avoid white rice.  Patient states he changed his insulin injection site from his waist to his leg and has been getting lower FBG readings. Pt had questions if the change in site helped the insulin work better.  Patient's wife asked about supplements, and specifically cinnamon.  RD recommended they return next week to get more specific recommendations from Christopher [name], [place] who specializes in T1DM.  Diabetes Self-Management Education - 05/21/18 1430      Visit Information   Visit Type  First/Initial      Initial Visit   Diabetes Type  Type 1    Are you currently following a meal plan?  No    Are you taking your medications  as prescribed?  Yes    Date Diagnosed  2012      Health Coping   How would you rate your overall health?  Fair      Psychosocial Assessment   Patient Belief/Attitude about Diabetes  Motivated to manage diabetes    How often do you need to have someone help you when you read instructions, pamphlets, or other written materials from your doctor or pharmacy?  1 - Never    What is the last grade level you completed in school?  13      Complications   Last HgB A1C per patient/outside source  10 %    How often do you check your blood sugar?  1-2 times/day    Fasting Blood glucose range (mg/dL)  -- 90-98    Number of hypoglycemic episodes per month  2    Can you tell when your blood sugar is low?  No    Have you had a dilated eye exam in the past 12 months?  Yes    Have you had a dental exam in the past 12 months?  Yes    Are you checking your feet?  Yes    How many days per week are you checking your feet?  1      Dietary Intake   Breakfast  oatmeal OR cold cereal OR sausage, eggs,  pancakes    Snack (morning)  none    Lunch  chicken nuggests OR ramen noodles    Snack (afternoon)  none    Dinner  ramen noodles, or chicken nuggests OR chicken, northen beans, brown rice, corn bread    Snack (evening)  none    Beverage(s)  water      Exercise   Exercise Type  Moderate (swimming / aerobic walking)    How many days per week to you exercise?  4    How many minutes per day do you exercise?  90    Total minutes per week of exercise  360      Patient Education   Previous Diabetes Education  Yes (please comment) 2012 T2DM classes    Disease state   Definition of diabetes, type 1 and 2, and the diagnosis of diabetes    Acute complications  Taught treatment of hypoglycemia - the 15 rule.      Individualized Goals (developed by patient)   Nutrition  Adjust meds/carbs with exercise as discussed    Monitoring   test my blood glucose as discussed      Outcomes   Expected Outcomes  Demonstrated  interest in learning. Expect positive outcomes    Future DMSE  Other (comment) 1 week meet with T1DM educator    Program Status  Not Completed      Individualized Plan for Diabetes Self-Management Training:   Learning Objective:  Patient will have a greater understanding of diabetes self-management. Patient education plan is to attend individual and/or group sessions per assessed needs and concerns.   Patient Instructions  Keep checking your blood sugar as you are and consider checking a time or 2 after a meal 2 hrs. Return to see [facility] for more specific recommendations Get some glucose tablets or candy to keep by your bed. Check your blood sugar if you wake up sweating.   Expected Outcomes:  Demonstrated interest in learning. Expect positive outcomes  Education material provided: Carbohydrate counting sheet, Afrezza brochure.  If problems or questions, patient to contact team via:  Phone  Future DSME appointment: Other (comment)(1 week meet with T1DM educator)

## 2018-05-21 NOTE — Patient Instructions (Signed)
Keep checking your blood sugar as you are and consider checking a time or 2 after a meal 2 hrs. Return to see [facility] for more specific recommendations Get some glucose tablets or candy to keep by your bed. Check your blood sugar if you wake up sweating.

## 2018-05-26 ENCOUNTER — Encounter: Payer: Medicare HMO | Admitting: *Deleted

## 2018-05-26 DIAGNOSIS — Z713 Dietary counseling and surveillance: Secondary | ICD-10-CM | POA: Diagnosis not present

## 2018-05-26 DIAGNOSIS — Z794 Long term (current) use of insulin: Secondary | ICD-10-CM | POA: Diagnosis not present

## 2018-05-26 DIAGNOSIS — E1142 Type 2 diabetes mellitus with diabetic polyneuropathy: Secondary | ICD-10-CM | POA: Diagnosis not present

## 2018-05-26 DIAGNOSIS — E108 Type 1 diabetes mellitus with unspecified complications: Secondary | ICD-10-CM

## 2018-05-28 NOTE — Patient Instructions (Addendum)
Plan:   Aim for 4 Carb Choices per meal (60 grams) +/- 1 either way   Aim for 0-2 Carbs per snack if hungry   Include protein in moderation with your meals and snacks  Continue with your activity level daily as tolerated  Continue checking BG at alternate times per day as directed by MD   Consider eating a snack before you mow your grass if you have not had a meal lately, to help prevent low blood sugars  You have a Glucagon kit at home, review the instructions with your wife so if she needs to use it, she will be prepared.   Continue taking medication as directed by MD

## 2018-05-28 NOTE — Progress Notes (Signed)
Diabetes Self-Management Education  Visit Type:  Follow-up  Appt. Start Time: 1530 Appt. End Time: 1630  05/28/2018  [name], identified by name and date of birth, is a 72 y.o. male with a diagnosis of Diabetes: Type 1.  Patient and wife met with another RD last week who suggested they follow up with me as he has Type 1 Diabetes. Patient here with his wife who participated in the visit. He is currently on NPH insulin @ 65 units once a day in the AM. He is physically active almost every day with yard work and walking for at least an hour. Diet history obtained, he eats fair variety of all food groups with exception of many vegetables. His beverages are primarily water.He would like more information on how to eat to control his BG better today.  ASSESSMENT  Height 6' 2.5" (1.892 m), weight 213 lb 12.8 oz (97 kg). Body mass index is 27.08 kg/m.   Diabetes Self-Management Education - 05/28/18 0747      Psychosocial Assessment   Patient Belief/Attitude about Diabetes  Motivated to manage diabetes    Self-care barriers  None    Self-management support  Family    Patient Concerns  Nutrition/Meal planning;Glycemic Control    Special Needs  None    Learning Readiness  Change in progress      Complications   How often do you check your blood sugar?  1-2 times/day    Fasting Blood glucose range (mg/dL)  130-179;180-200    Postprandial Blood glucose range (mg/dL)  70-129;130-179      Exercise   Exercise Type  Moderate (swimming / aerobic walking)    How many days per week to you exercise?  4    How many minutes per day do you exercise?  90    Total minutes per week of exercise  360      Patient Education   Disease state   Definition of diabetes, type 1 and 2, and the diagnosis of diabetes    Nutrition management   Role of diet in the treatment of diabetes and the relationship between the three main macronutrients and blood glucose level;Carbohydrate counting;Meal timing in regards  to the patients' current diabetes medication.    Physical activity and exercise   Role of exercise on diabetes management, blood pressure control and cardiac health.;Identified with patient nutritional and/or medication changes necessary with exercise.    Medications  Reviewed patients medication for diabetes, action, purpose, timing of dose and side effects.    Monitoring  Identified appropriate SMBG and/or A1C goals.    Acute complications  Taught treatment of hypoglycemia - the 15 rule.      Individualized Goals (developed by patient)   Nutrition  Follow meal plan discussed    Physical Activity  Exercise 3-5 times per week    Medications  take my medication as prescribed    Monitoring   test my blood glucose as discussed      Post-Education Assessment   Patient understands the diabetes disease and treatment process.  Demonstrates understanding / competency    Patient understands incorporating nutritional management into lifestyle.  Demonstrates understanding / competency    Patient undertands incorporating physical activity into lifestyle.  Demonstrates understanding / competency    Patient understands using medications safely.  Demonstrates understanding / competency    Patient understands monitoring blood glucose, interpreting and using results  Demonstrates understanding / competency    Patient understands prevention, detection, and treatment of acute  complications.  Demonstrates understanding / competency      Outcomes   Program Status  Completed      Subsequent Visit   Since your last visit have you continued or begun to take your medications as prescribed?  Yes    Since your last visit, are you checking your blood glucose at least once a day?  Yes       Learning Objective:  Patient will have a greater understanding of diabetes self-management. Patient education plan is to attend individual and/or group sessions per assessed needs and concerns.  Plan:   Patient Instructions   Plan:   Aim for 4 Carb Choices per meal (60 grams) +/- 1 either way   Aim for 0-2 Carbs per snack if hungry   Include protein in moderation with your meals and snacks  Continue with your activity level daily as tolerated  Continue checking BG at alternate times per day as directed by MD   Consider eating a snack before you mow your grass if you have not had a meal lately, to help prevent low blood sugars  You have a Glucagon kit at home, review the instructions with your wife so if she needs to use it, she will be prepared.   Continue taking medication as directed by MD   Expected Outcomes:  Demonstrated interest in learning. Expect positive outcomes  Education material provided: ADA Diabetes: Your Take Control Guide, Food label handouts, Meal plan card and Carbohydrate counting sheet  If problems or questions, patient to contact team via:  Phone  Future DSME appointment: - PRN

## 2018-06-01 ENCOUNTER — Other Ambulatory Visit: Payer: Self-pay

## 2018-06-01 ENCOUNTER — Telehealth: Payer: Self-pay | Admitting: Family Medicine

## 2018-06-01 MED ORDER — LISINOPRIL 2.5 MG PO TABS: 2.5000 mg | ORAL_TABLET | Freq: Every day | ORAL | 1 refills | Status: DC

## 2018-06-01 MED ORDER — HYDROCHLOROTHIAZIDE 25 MG PO TABS: 25.0000 mg | ORAL_TABLET | Freq: Every day | ORAL | 1 refills | Status: DC

## 2018-06-01 NOTE — Telephone Encounter (Signed)
Patient needs a 90 day supply of Hydrochlorothiazide and lisinopril called in to [facility] on [address].  Patient is out of both medications.

## 2018-06-01 NOTE — Telephone Encounter (Signed)
Rx requested by pt have been sent to the pharmacy.

## 2018-06-02 ENCOUNTER — Other Ambulatory Visit: Payer: Self-pay | Admitting: Endocrinology

## 2018-06-12 ENCOUNTER — Encounter: Payer: Self-pay | Admitting: Endocrinology

## 2018-06-12 ENCOUNTER — Ambulatory Visit (INDEPENDENT_AMBULATORY_CARE_PROVIDER_SITE_OTHER): Payer: Medicare HMO | Admitting: Endocrinology

## 2018-06-12 VITALS — BP 116/68 | HR 65 | Ht 75.0 in | Wt 212.4 lb

## 2018-06-12 DIAGNOSIS — E1142 Type 2 diabetes mellitus with diabetic polyneuropathy: Secondary | ICD-10-CM | POA: Diagnosis not present

## 2018-06-12 DIAGNOSIS — Z794 Long term (current) use of insulin: Secondary | ICD-10-CM | POA: Diagnosis not present

## 2018-06-12 LAB — POCT GLYCOSYLATED HEMOGLOBIN (HGB A1C): Hemoglobin A1C: 9 % — AB (ref 4.0–5.6)

## 2018-06-12 MED ORDER — INSULIN NPH (HUMAN) (ISOPHANE) 100 UNIT/ML ~~LOC~~ SUSP: 70.0000 [IU] | SUBCUTANEOUS | 11 refills | Status: DC

## 2018-06-12 NOTE — Patient Instructions (Signed)
check your blood sugar twice a day.  vary the time of day when you check, between before the 3 meals, and at bedtime.  also check if you have symptoms of your blood sugar being too high or too low.  please keep a record of the readings and bring it to your next appointment here (or you can bring the meter itself).  You can write it on any piece of paper.  please call us sooner if your blood sugar goes below 70, or if you have a lot of readings over 200.   Please increase the NPH insulin to 70 units each morning.   On this type of insulin schedule, you should eat meals on a regular schedule.  If a meal is missed or significantly delayed, your blood sugar could go low.   Please come back for a follow-up appointment in 2 months.

## 2018-06-12 NOTE — Progress Notes (Signed)
Subjective:    Patient ID: Christopher [name], male    DOB: 27-Jul-1946, 72 y.o.   MRN: [id number]  HPI Pt returns for f/u of diabetes mellitus: DM type: 1 Dx'ed: 2010.   Complications: polyneuropathy and renal insuff.   Therapy: insulin since 2013.   DKA: never.   Severe hypoglycemia: last episode was in 2018.   Pancreatitis: never.  Other: he is back on QD insulin, after noncompliance with multiple daily injections; he could not take lantus, due to AM hypoglycemia and PM hyperglycemia.   Interval history: he takes 65 units qam.  no cbg record, but states cbg's vary from 80-200.  It is in general higher as the day goes on.  pt states he feels well in general.  He never misses the insulin.   Past Medical History:  Diagnosis Date  . Diabetes mellitus type II   . GERD (gastroesophageal reflux disease)   . Glaucoma   . Hyperlipidemia     Past Surgical History:  Procedure Laterality Date  . HERNIA REPAIR    . [place]    Social History   Socioeconomic History  . Marital status: Married    Spouse name: Not on file  . Number of children: Not on file  . Years of education: Not on file  . Highest education level: Not on file  Occupational History  . Not on file  Social Needs  . Financial resource strain: Not on file  . Food insecurity:    Worry: Not on file    Inability: Not on file  . Transportation needs:    Medical: Not on file    Non-medical: Not on file  Tobacco Use  . Smoking status: Never Smoker  . Smokeless tobacco: Never Used  Substance and Sexual Activity  . Alcohol use: No  . Drug use: No  . Sexual activity: Not on file  Lifestyle  . Physical activity:    Days per week: Not on file    Minutes per session: Not on file  . Stress: Not on file  Relationships  . Social connections:    Talks on phone: Not on file    Gets together: Not on file    Attends religious service: Not on file    Active member of club or organization: Not on file      Attends meetings of clubs or organizations: Not on file    Relationship status: Not on file  . Intimate partner violence:    Fear of current or ex partner: Not on file    Emotionally abused: Not on file    Physically abused: Not on file    Forced sexual activity: Not on file  Other Topics Concern  . Not on file  Social History Narrative  . Not on file    Current Outpatient Medications on File Prior to Visit  Medication Sig Dispense Refill  . Alcohol Swabs (B-D SINGLE USE SWABS REGULAR) PADS USE TO CHECK BLOOD SUGAR 2 TIMES PER DAY 200 each prn  . ALPHA LIPOIC ACID-BIOTIN PO Take by mouth.    . ALPHAGAN P 0.1 % SOLN Place 1 drop into both eyes daily.     Marland Kitchen aspirin EC 81 MG EC tablet Take 81 mg by mouth daily.      . Blood Glucose Calibration (ACCU-CHEK SMARTVIEW CONTROL) LIQD 1 Bottle by In Vitro route as needed. 1 each prn  . Coenzyme Q10 (CO Q-10 PO) Take by mouth.    Marland Kitchen  dorzolamide-timolol (COSOPT) 22.3-6.8 MG/ML ophthalmic solution Place 1 drop into both eyes 2 (two) times daily.     Marland Kitchen glucagon 1 MG injection Inject 1 mg into the muscle once as needed. (Patient taking differently: Inject 1 mg into the muscle daily. ) 1 each 12  . glucose blood (ACCU-CHEK GUIDE) test strip Used to check blood sugars two times a day. 100 each 12  . hydrochlorothiazide (HYDRODIURIL) 25 MG tablet Take 1 tablet (25 mg total) by mouth daily. 90 tablet 1  . Insulin Syringe-Needle U-100 (INSULIN SYRINGE 1CC/31GX5/16") 31G X 5/16" 1 ML MISC USE TO INJECT INSULIN ONCE A DAY 100 each 0  . lisinopril (PRINIVIL,ZESTRIL) 2.5 MG tablet Take 1 tablet (2.5 mg total) by mouth daily. 90 tablet 1  . Multiple Vitamins-Minerals (CENTRUM SILVER ULTRA MENS PO) Take by mouth daily.      Glory Rosebush DELICA LANCETS 23J MISC Use to check blood sugar 2 times per day. 200 each 2  . simvastatin (ZOCOR) 80 MG tablet Take 1 tablet (80 mg total) by mouth every evening. 90 tablet 0   No current facility-administered medications on  file prior to visit.     No Known Allergies  Family History  Problem Relation Age of Onset  . Alcohol abuse Father   . Diabetes Sister     BP 116/68 (BP Location: Left Arm, Patient Position: Sitting, Cuff Size: Normal)   Pulse 65   Ht 6\' 3"  (1.905 m)   Wt 212 lb 6.4 oz (96.3 kg)   SpO2 98%   BMI 26.55 kg/m    Review of Systems He denies hypoglycemia    Objective:   Physical Exam VITAL SIGNS:  See vs page GENERAL: no distress Pulses: dorsalis pedis intact bilat.   MSK: no deformity of the feet CV: no leg edema Skin:  no ulcer on the feet.  normal color and temp on the feet. Neuro: sensation is intact to touch on the feet   Lab Results  Component Value Date   CREATININE 1.22 03/25/2017   BUN 16 03/25/2017   NA 139 03/25/2017   K 3.8 03/25/2017   CL 103 03/25/2017   CO2 28 03/25/2017        Assessment & Plan:  Type 1 DM, with renal insuff: he needs increased rx  Patient Instructions  check your blood sugar twice a day.  vary the time of day when you check, between before the 3 meals, and at bedtime.  also check if you have symptoms of your blood sugar being too high or too low.  please keep a record of the readings and bring it to your next appointment here (or you can bring the meter itself).  You can write it on any piece of paper.  please call us sooner if your blood sugar goes below 70, or if you have a lot of readings over 200.   Please increase the NPH insulin to 70 units each morning.   On this type of insulin schedule, you should eat meals on a regular schedule.  If a meal is missed or significantly delayed, your blood sugar could go low.   Please come back for a follow-up appointment in 2 months.

## 2018-06-26 ENCOUNTER — Emergency Department (HOSPITAL_COMMUNITY)
Admission: EM | Admit: 2018-06-26 | Discharge: 2018-06-27 | Disposition: A | Discharge status: Home / Self Care | Payer: Medicare HMO | Attending: Emergency Medicine | Admitting: Emergency Medicine

## 2018-06-26 ENCOUNTER — Emergency Department (HOSPITAL_COMMUNITY): Payer: Medicare HMO

## 2018-06-26 ENCOUNTER — Other Ambulatory Visit: Payer: Self-pay

## 2018-06-26 DIAGNOSIS — E161 Other hypoglycemia: Secondary | ICD-10-CM | POA: Diagnosis not present

## 2018-06-26 DIAGNOSIS — Z79899 Other long term (current) drug therapy: Secondary | ICD-10-CM | POA: Diagnosis not present

## 2018-06-26 DIAGNOSIS — R4182 Altered mental status, unspecified: Secondary | ICD-10-CM | POA: Diagnosis not present

## 2018-06-26 DIAGNOSIS — Y92410 Unspecified street and highway as the place of occurrence of the external cause: Secondary | ICD-10-CM | POA: Insufficient documentation

## 2018-06-26 DIAGNOSIS — Z7982 Long term (current) use of aspirin: Secondary | ICD-10-CM | POA: Insufficient documentation

## 2018-06-26 DIAGNOSIS — Y9389 Activity, other specified: Secondary | ICD-10-CM | POA: Insufficient documentation

## 2018-06-26 DIAGNOSIS — E162 Hypoglycemia, unspecified: Secondary | ICD-10-CM

## 2018-06-26 DIAGNOSIS — R402441 Other coma, without documented Glasgow coma scale score, or with partial score reported, in the field [EMT or ambulance]: Secondary | ICD-10-CM | POA: Diagnosis not present

## 2018-06-26 DIAGNOSIS — R000 Tachycardia, unspecified: Secondary | ICD-10-CM | POA: Diagnosis not present

## 2018-06-26 DIAGNOSIS — S0990XA Unspecified injury of head, initial encounter: Secondary | ICD-10-CM | POA: Diagnosis not present

## 2018-06-26 DIAGNOSIS — Y999 Unspecified external cause status: Secondary | ICD-10-CM | POA: Diagnosis not present

## 2018-06-26 DIAGNOSIS — E11649 Type 2 diabetes mellitus with hypoglycemia without coma: Secondary | ICD-10-CM | POA: Insufficient documentation

## 2018-06-26 DIAGNOSIS — Z794 Long term (current) use of insulin: Secondary | ICD-10-CM | POA: Insufficient documentation

## 2018-06-26 DIAGNOSIS — R456 Violent behavior: Secondary | ICD-10-CM | POA: Diagnosis not present

## 2018-06-26 LAB — CBC WITH DIFFERENTIAL/PLATELET
Abs Immature Granulocytes: 0 10*3/uL (ref 0.0–0.1)
Basophils Absolute: 0 10*3/uL (ref 0.0–0.1)
Basophils Relative: 1 %
EOS PCT: 1 %
Eosinophils Absolute: 0.1 10*3/uL (ref 0.0–0.7)
HCT: 41.2 % (ref 39.0–52.0)
HEMOGLOBIN: 14.3 g/dL (ref 13.0–17.0)
Immature Granulocytes: 0 %
LYMPHS PCT: 16 %
Lymphs Abs: 1.1 10*3/uL (ref 0.7–4.0)
MCH: 26.9 pg (ref 26.0–34.0)
MCHC: 34.7 g/dL (ref 30.0–36.0)
MCV: 77.6 fL — ABNORMAL LOW (ref 78.0–100.0)
MONO ABS: 0.5 10*3/uL (ref 0.1–1.0)
Monocytes Relative: 7 %
Neutro Abs: 4.8 10*3/uL (ref 1.7–7.7)
Neutrophils Relative %: 75 %
Platelets: 230 10*3/uL (ref 150–400)
RBC: 5.31 MIL/uL (ref 4.22–5.81)
RDW: 14 % (ref 11.5–15.5)
WBC: 6.4 10*3/uL (ref 4.0–10.5)

## 2018-06-26 LAB — COMPREHENSIVE METABOLIC PANEL
ALBUMIN: 4.1 g/dL (ref 3.5–5.0)
ALK PHOS: 71 U/L (ref 38–126)
ALT: 19 U/L (ref 0–44)
AST: 25 U/L (ref 15–41)
Anion gap: 7 (ref 5–15)
BILIRUBIN TOTAL: 0.7 mg/dL (ref 0.3–1.2)
BUN: 14 mg/dL (ref 8–23)
CALCIUM: 9.7 mg/dL (ref 8.9–10.3)
CO2: 27 mmol/L (ref 22–32)
Chloride: 110 mmol/L (ref 98–111)
Creatinine, Ser: 1.48 mg/dL — ABNORMAL HIGH (ref 0.61–1.24)
GFR calc non Af Amer: 46 mL/min — ABNORMAL LOW (ref >60)
GFR, EST AFRICAN AMERICAN: 53 mL/min — AB (ref >60)
GLUCOSE: 153 mg/dL — AB (ref 70–99)
POTASSIUM: 3.6 mmol/L (ref 3.5–5.1)
SODIUM: 144 mmol/L (ref 135–145)
TOTAL PROTEIN: 6.9 g/dL (ref 6.5–8.1)

## 2018-06-26 LAB — CBG MONITORING, ED
GLUCOSE-CAPILLARY: 153 mg/dL — AB (ref 70–99)
Glucose-Capillary: 104 mg/dL — ABNORMAL HIGH (ref 70–99)

## 2018-06-26 LAB — I-STAT TROPONIN, ED: Troponin i, poc: 0.04 ng/mL (ref 0.00–0.08)

## 2018-06-26 LAB — ETHANOL
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
## 2018-06-27 LAB — CBG MONITORING, ED
GLUCOSE-CAPILLARY: 220 mg/dL — AB (ref 70–99)
Glucose-Capillary: 215 mg/dL — ABNORMAL HIGH (ref 70–99)
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
## 2018-06-29 ENCOUNTER — Telehealth: Payer: Self-pay | Admitting: Emergency Medicine

## 2018-06-29 MED ORDER — FREESTYLE LIBRE 14 DAY SENSOR MISC: 1.0000 | 3 refills | Status: DC

## 2018-06-29 MED ORDER — FREESTYLE LIBRE 14 DAY READER DEVI: 1.0000 | Freq: Once | 0 refills | Status: AC

## 2018-06-29 NOTE — Telephone Encounter (Signed)
Please advise 

## 2018-06-29 NOTE — Telephone Encounter (Signed)
I have sent a prescription to your pharmacy  

## 2018-06-29 NOTE — Telephone Encounter (Signed)
Pt aware.

## 2018-06-29 NOTE — Telephone Encounter (Signed)
Pt is asking if he can get a prescription for the freestyle Libre. Pharmacy is [facility]. Thanks.

## 2018-07-20 ENCOUNTER — Telehealth: Payer: Self-pay | Admitting: Family Medicine

## 2018-07-20 NOTE — Telephone Encounter (Signed)
[place] DMV form was received spoke with Christopher [name] voiced understanding that Dr Sherren Mocha is out of the office and will need to have another provider complete the form, Christopher [name] states that he will pick up the form from the office in the morning and drop it to Dr Loanne Drilling to complete if he agrees to it.

## 2018-07-20 NOTE — Telephone Encounter (Signed)
Pt dropped off [place] DMV Medical Review Form to be completed by the provider and would like to be called at [phone] or ([phone] to pick the forms up.  Form was put in the providers folder for completion.

## 2018-07-21 NOTE — Telephone Encounter (Signed)
Pt came by and picked the [facility] DMV form up and stated he will call them(DMV in [place]) to see if he could get an extension on the form and bring it back if they give an extension when Dr. Sherren Mocha returns in September to have him fill it out.

## 2018-07-27 NOTE — Telephone Encounter (Signed)
Noted  

## 2018-08-03 DIAGNOSIS — H401131 Primary open-angle glaucoma, bilateral, mild stage: Secondary | ICD-10-CM | POA: Diagnosis not present

## 2018-08-03 DIAGNOSIS — H5340 Unspecified visual field defects: Secondary | ICD-10-CM | POA: Diagnosis not present

## 2018-08-12 ENCOUNTER — Encounter: Payer: Self-pay | Admitting: Endocrinology

## 2018-08-12 ENCOUNTER — Ambulatory Visit (INDEPENDENT_AMBULATORY_CARE_PROVIDER_SITE_OTHER): Payer: Medicare HMO | Admitting: Endocrinology

## 2018-08-12 VITALS — BP 102/68 | HR 72 | Ht 75.0 in | Wt 210.0 lb

## 2018-08-12 DIAGNOSIS — E1065 Type 1 diabetes mellitus with hyperglycemia: Secondary | ICD-10-CM

## 2018-08-12 DIAGNOSIS — IMO0002 Reserved for concepts with insufficient information to code with codable children: Secondary | ICD-10-CM

## 2018-08-12 DIAGNOSIS — E108 Type 1 diabetes mellitus with unspecified complications: Secondary | ICD-10-CM | POA: Diagnosis not present

## 2018-08-12 LAB — POCT GLYCOSYLATED HEMOGLOBIN (HGB A1C): Hemoglobin A1C: 8.5 % — AB (ref 4.0–5.6)

## 2018-08-12 MED ORDER — INSULIN NPH (HUMAN) (ISOPHANE) 100 UNIT/ML ~~LOC~~ SUSP: 75.0000 [IU] | SUBCUTANEOUS | 11 refills | Status: DC

## 2018-08-12 NOTE — Progress Notes (Signed)
Subjective:    Patient ID: Christopher [name], male    DOB: [birth year], 72 y.o.   MRN: [id number]  HPI Pt returns for f/u of diabetes mellitus: DM type: 1 Dx'ed: 2010.   Complications: polyneuropathy and renal insuff.   Therapy: insulin since 2013.   DKA: never.   Severe hypoglycemia: last episode was in 2018.   Pancreatitis: never.  Other: he is back on QD insulin, after noncompliance with multiple daily injections; he could not take lantus, due to AM hypoglycemia and PM hyperglycemia.   Interval history: he takes 70 units qam.  no cbg record, but states cbg's vary from 56-400.  It is in general higher as the day goes on.  pt states he feels well in general.  He never misses the insulin.  He seldom has hypoglycemia, and these episodes are mild.   Past Medical History:  Diagnosis Date  . Diabetes mellitus type II   . GERD (gastroesophageal reflux disease)   . Glaucoma   . Hyperlipidemia     Past Surgical History:  Procedure Laterality Date  . HERNIA REPAIR    . [place]    Social History   Socioeconomic History  . Marital status: Married    Spouse name: Not on file  . Number of children: Not on file  . Years of education: Not on file  . Highest education level: Not on file  Occupational History  . Not on file  Social Needs  . Financial resource strain: Not on file  . Food insecurity:    Worry: Not on file    Inability: Not on file  . Transportation needs:    Medical: Not on file    Non-medical: Not on file  Tobacco Use  . Smoking status: Never Smoker  . Smokeless tobacco: Never Used  Substance and Sexual Activity  . Alcohol use: No  . Drug use: No  . Sexual activity: Not on file  Lifestyle  . Physical activity:    Days per week: Not on file    Minutes per session: Not on file  . Stress: Not on file  Relationships  . Social connections:    Talks on phone: Not on file    Gets together: Not on file    Attends religious service: Not on  file    Active member of club or organization: Not on file    Attends meetings of clubs or organizations: Not on file    Relationship status: Not on file  . Intimate partner violence:    Fear of current or ex partner: Not on file    Emotionally abused: Not on file    Physically abused: Not on file    Forced sexual activity: Not on file  Other Topics Concern  . Not on file  Social History Narrative  . Not on file    Current Outpatient Medications on File Prior to Visit  Medication Sig Dispense Refill  . Alcohol Swabs (B-D SINGLE USE SWABS REGULAR) PADS USE TO CHECK BLOOD SUGAR 2 TIMES PER DAY 200 each prn  . ALPHAGAN P 0.1 % SOLN Place 1 drop into both eyes daily.     Marland Kitchen aspirin EC 81 MG EC tablet Take 81 mg by mouth daily.      . Blood Glucose Calibration (ACCU-CHEK SMARTVIEW CONTROL) LIQD 1 Bottle by In Vitro route as needed. 1 each prn  . Continuous Blood Gluc Receiver (FREESTYLE LIBRE 14 DAY READER) DEVI See admin instructions.  0  . Continuous Blood Gluc Sensor (FREESTYLE LIBRE 14 DAY SENSOR) MISC 1 Device by Does not apply route every 14 (fourteen) days. 6 each 3  . dorzolamide-timolol (COSOPT) 22.3-6.8 MG/ML ophthalmic solution Place 1 drop into both eyes 2 (two) times daily.     Marland Kitchen glucagon 1 MG injection Inject 1 mg into the muscle once as needed. (Patient taking differently: Inject 1 mg into the muscle once as needed (for blood sugar). ) 1 each 12  . glucose blood (ACCU-CHEK GUIDE) test strip Used to check blood sugars two times a day. 100 each 12  . hydrochlorothiazide (HYDRODIURIL) 25 MG tablet Take 1 tablet (25 mg total) by mouth daily. 90 tablet 1  . Insulin Syringe-Needle U-100 (INSULIN SYRINGE 1CC/31GX5/16") 31G X 5/16" 1 ML MISC USE TO INJECT INSULIN ONCE A DAY 100 each 0  . lisinopril (PRINIVIL,ZESTRIL) 2.5 MG tablet Take 1 tablet (2.5 mg total) by mouth daily. 90 tablet 1  . Multiple Vitamins-Minerals (CENTRUM SILVER ULTRA MENS PO) Take 1 tablet by mouth daily.     Glory Rosebush DELICA LANCETS 28U MISC Use to check blood sugar 2 times per day. 200 each 2  . simvastatin (ZOCOR) 80 MG tablet Take 1 tablet (80 mg total) by mouth every evening. 90 tablet 0   No current facility-administered medications on file prior to visit.     No Known Allergies  Family History  Problem Relation Age of Onset  . Alcohol abuse Father   . Diabetes Sister     BP 102/68 (BP Location: Left Arm, Patient Position: Sitting, Cuff Size: Normal)   Pulse 72   Ht 6\' 3"  (1.905 m)   Wt 210 lb (95.3 kg)   SpO2 97%   BMI 26.25 kg/m    Review of Systems Denies LOC    Objective:   Physical Exam VITAL SIGNS:  See vs page GENERAL: no distress Pulses: foot pulses are intact bilaterally.   MSK: no deformity of the feet or ankles.  CV: no edema of the legs or ankles Skin:  no ulcer on the feet or ankles.  normal color and temp on the feet and ankles Neuro: sensation is intact to touch on the feet and ankles.    Lab Results  Component Value Date   HGBA1C 8.5 (A) 08/12/2018       Assessment & Plan:  Type 1 DM, with polyneuropathy: based on the pattern of cbg's, he may need 70/30, but we'll try increasing NPH first Hypoglycemia: this limits agressiveness of glycemic control Renal insuff: this also suggests he may need 70/30  Patient Instructions  check your blood sugar twice a day.  vary the time of day when you check, between before the 3 meals, and at bedtime.  also check if you have symptoms of your blood sugar being too high or too low.  please keep a record of the readings and bring it to your next appointment here (or you can bring the meter itself).  You can write it on any piece of paper.  please call us sooner if your blood sugar goes below 70, or if you have a lot of readings over 200.   Please increase the NPH insulin to 75 units each morning.   However, if you are going to be active, take just 50 units that morning.  If you are unable to anticipate the activity, eat  a light snack with it  On this type of insulin schedule, you should eat meals on  a regular schedule.  If a meal is missed or significantly delayed, your blood sugar could go low.   Please come back for a follow-up appointment in 2 months.

## 2018-08-12 NOTE — Patient Instructions (Addendum)
check your blood sugar twice a day.  vary the time of day when you check, between before the 3 meals, and at bedtime.  also check if you have symptoms of your blood sugar being too high or too low.  please keep a record of the readings and bring it to your next appointment here (or you can bring the meter itself).  You can write it on any piece of paper.  please call us sooner if your blood sugar goes below 70, or if you have a lot of readings over 200.   Please increase the NPH insulin to 75 units each morning.   However, if you are going to be active, take just 50 units that morning.  If you are unable to anticipate the activity, eat a light snack with it  On this type of insulin schedule, you should eat meals on a regular schedule.  If a meal is missed or significantly delayed, your blood sugar could go low.   Please come back for a follow-up appointment in 2 months.

## 2018-08-26 ENCOUNTER — Encounter: Payer: Self-pay | Admitting: Family Medicine

## 2018-08-26 ENCOUNTER — Ambulatory Visit (INDEPENDENT_AMBULATORY_CARE_PROVIDER_SITE_OTHER): Payer: Medicare HMO | Admitting: Family Medicine

## 2018-08-26 VITALS — BP 108/64 | HR 67 | Temp 98.0°F | Ht 75.0 in | Wt 210.5 lb

## 2018-08-26 DIAGNOSIS — Z0000 Encounter for general adult medical examination without abnormal findings: Secondary | ICD-10-CM | POA: Diagnosis not present

## 2018-08-26 DIAGNOSIS — Z23 Encounter for immunization: Secondary | ICD-10-CM

## 2018-08-26 DIAGNOSIS — E785 Hyperlipidemia, unspecified: Secondary | ICD-10-CM | POA: Diagnosis not present

## 2018-08-26 DIAGNOSIS — Z136 Encounter for screening for cardiovascular disorders: Secondary | ICD-10-CM

## 2018-08-26 DIAGNOSIS — E108 Type 1 diabetes mellitus with unspecified complications: Secondary | ICD-10-CM | POA: Diagnosis not present

## 2018-08-26 LAB — CBC WITH DIFFERENTIAL/PLATELET
Basophils Absolute: 0 10*3/uL (ref 0.0–0.1)
Basophils Relative: 0.5 % (ref 0.0–3.0)
EOS ABS: 0.1 10*3/uL (ref 0.0–0.7)
Eosinophils Relative: 1 % (ref 0.0–5.0)
HEMATOCRIT: 41.9 % (ref 39.0–52.0)
HEMOGLOBIN: 14.5 g/dL (ref 13.0–17.0)
LYMPHS PCT: 20.5 % (ref 12.0–46.0)
Lymphs Abs: 1.2 10*3/uL (ref 0.7–4.0)
MCHC: 34.6 g/dL (ref 30.0–36.0)
MCV: 78.3 fl (ref 78.0–100.0)
MONO ABS: 0.3 10*3/uL (ref 0.1–1.0)
Monocytes Relative: 5.2 % (ref 3.0–12.0)
Neutro Abs: 4.2 10*3/uL (ref 1.4–7.7)
Neutrophils Relative %: 72.8 % (ref 43.0–77.0)
Platelets: 255 10*3/uL (ref 150.0–400.0)
RBC: 5.35 Mil/uL (ref 4.22–5.81)
RDW: 14.6 % (ref 11.5–15.5)
WBC: 5.7 10*3/uL (ref 4.0–10.5)

## 2018-08-26 LAB — MICROALBUMIN / CREATININE URINE RATIO
CREATININE, U: 126.5 mg/dL
Microalb Creat Ratio: 0.6 mg/g (ref 0.0–30.0)

## 2018-08-26 LAB — BASIC METABOLIC PANEL
BUN: 20 mg/dL (ref 6–23)
CALCIUM: 9.6 mg/dL (ref 8.4–10.5)
CO2: 29 mEq/L (ref 19–32)
Chloride: 101 mEq/L (ref 96–112)
Creatinine, Ser: 1.31 mg/dL (ref 0.40–1.50)
GFR: 69.33 mL/min (ref >60.00)
GLUCOSE: 304 mg/dL — AB (ref 70–99)
POTASSIUM: 3.7 meq/L (ref 3.5–5.1)
SODIUM: 136 meq/L (ref 135–145)

## 2018-08-26 LAB — LIPID PANEL
CHOL/HDL RATIO: 5
Cholesterol: 228 mg/dL — ABNORMAL HIGH (ref 0–200)
HDL: 50.1 mg/dL (ref >39.00)
LDL CALC: 159 mg/dL — AB (ref 0–99)
NONHDL: 178.14
TRIGLYCERIDES: 97 mg/dL (ref 0.0–149.0)
VLDL: 19.4 mg/dL (ref 0.0–40.0)

## 2018-08-26 LAB — HEPATIC FUNCTION PANEL
ALK PHOS: 74 U/L (ref 39–117)
ALT: 28 U/L (ref 0–53)
AST: 30 U/L (ref 0–37)
Albumin: 4.1 g/dL (ref 3.5–5.2)
BILIRUBIN DIRECT: 0.1 mg/dL (ref 0.0–0.3)
TOTAL PROTEIN: 6.7 g/dL (ref 6.0–8.3)
Total Bilirubin: 0.7 mg/dL (ref 0.2–1.2)

## 2018-08-26 LAB — TSH: TSH: 0.83 u[IU]/mL (ref 0.35–4.50)

## 2018-08-26 LAB — PSA: PSA: 0.6 ng/mL (ref 0.10–4.00)

## 2018-08-26 MED ORDER — HYDROCHLOROTHIAZIDE 25 MG PO TABS: 25.0000 mg | ORAL_TABLET | Freq: Every day | ORAL | 4 refills | Status: DC

## 2018-08-26 MED ORDER — SIMVASTATIN 80 MG PO TABS: 80.0000 mg | ORAL_TABLET | Freq: Every evening | ORAL | 4 refills | Status: DC

## 2018-08-26 MED ORDER — SILDENAFIL CITRATE 20 MG PO TABS: ORAL_TABLET | ORAL | 11 refills | Status: DC

## 2018-08-26 MED ORDER — LISINOPRIL 2.5 MG PO TABS: 2.5000 mg | ORAL_TABLET | Freq: Every day | ORAL | 4 refills | Status: DC

## 2018-08-26 NOTE — Progress Notes (Signed)
Christopher [name] is a 72 year old married male nonsmoker who comes in today for general physical examination because of a long-standing history of diabetes type 1  He is slim and muscular. 75 inches tall weight 210  He's been working with Dr. Ebony Hail at the endocrinology center because we were unable to get his blood sugar back to normal. Dr. Ebony Hail is try different combinations of insulin. Last A1c was in August 8 0.5.  On July 12 he was playing golf. It was a hot day he was driving home and had a syncopal episode because of hypoglycemia. He remembers waking up in the ambulance. He was taken the hospital given IV glucose blood sugar came back up. He saw Dr. Ebony Hail subsequent to that recommend he take 50 units of insulin in the morning instead of 70 when he is playing golf.  Couple weeks ago he just randomly checked his blood sugar after taking 50 units of insulin playing golf and it dropped to 47. He was asymptomatic at the time.  I recommend he decrease his dose to 40 units and monitor his blood sugar after playing golf.  He takes Zocor 80 mg daily along with a baby aspirin because of a history of hyperlipidemia  He takes lisinopril 2.5 mg daily for renal protection. BP is always been normal. BP today 108/64.  He gets routine eye care, dental care, colonoscopy was greater than 10 years ago. He recalls getting a letter that is due a colonoscopy. I asked him to call them and get that set up this fall  Vaccinations flu shot given today along with a tetanus booster. Information given on the shingles vaccine.  . Social history........Marland Kitchen Married lives here in [place] retired. Enjoys working around [employer] and playing golf with his friends a couple days a week.  Cognitive function normal he walks on a daily basis home health safety reviewed no issues identified, no guns in the house, he does have a healthcare power of attorney and living well.  BP 108/64 (BP Location: Left Arm, Patient Position:  Sitting, Cuff Size: Normal)   Pulse 67   Temp 98 F (36.7 C) (Oral)   Ht 6\' 3"  (1.905 m)   Wt 210 lb 8 oz (95.5 kg)   SpO2 97%   BMI 26.31 kg/m  Well-developed well-nourished thin male no acute distress vital signs stable he is afebrile HEENT were negative neck was supple thyroid is not enlarged no carotid bruits. Cardiopulmonary exam normal. Abdominal exam normal. Genitalia normal circumcised male. Rectal normal stool guaiac-negative prostate normal. Extremities normal skin normal peripheral pulses normal  #1 diabetes type 1........... Continue current therapy....... Follow-up with endocrinology as outlined by Dr. Ebony Hail...Marland KitchenMarland KitchenMarland Kitchen Decrease insulin from 70 units in the morning to 40 when he is going to play golf on a hot day and advised him to monitor his blood sugar during around and after he gets done playing golf.  #2 hyperlipidemia............ Continue meds check labs  #3 mild ED........... Generic Viagra

## 2018-08-26 NOTE — Patient Instructions (Signed)
Labs today.............. I will call you if there is anything abnormal  Continue current meds  1 you play golf on a warm day.............. I would recommend that you decrease your insulin from 70 units to 40 and monitor blood sugar during the round and after to prevent future episodes of hypoglycemia.  Generic Viagra 20 mg............Marland Kitchen 1-2 tabs 2-3 hours prior to sex  Follow-up with Dr. Loanne Drilling as outlined  Call GI and get set up for your follow-up colonoscopy  Call your insurance company find out we get the new shingles vaccine  Dr. Volanda Napoleon

## 2018-09-07 ENCOUNTER — Other Ambulatory Visit: Payer: Self-pay | Admitting: Endocrinology

## 2018-09-07 MED ORDER — "INSULIN SYRINGE 31G X 5/16"" 1 ML MISC": 0 refills | Status: DC

## 2018-09-07 NOTE — Telephone Encounter (Signed)
Pt called needing a refill for Insulin Syringe-Needle U-100 (INSULIN SYRINGE 1CC/31GX5/16") 31G X 5/16" 1 ML MISC. Pt has four more left.  Pharmacy is [id number] - [address], [place]  Call pt @ [phone]. Thank you!

## 2018-09-07 NOTE — Telephone Encounter (Signed)
Sent!

## 2018-09-22 ENCOUNTER — Telehealth: Payer: Self-pay

## 2018-09-22 NOTE — Telephone Encounter (Signed)
Pt is aware that he had A1C checked by Dr Loanne Drilling on 08/12/2018

## 2018-09-22 NOTE — Telephone Encounter (Signed)
Copied from [place] [id number]. Topic: General - Other >> Aug 14, 2018 11:47 AM Yvette Rack wrote: Reason for CRM: pt calling stating that someone had called him and told him that he hasn't had his A1C checked by Dr Sherren Mocha pt had just had his A1C checked with Dr Loanne Drilling on 08-12-18

## 2018-10-05 ENCOUNTER — Telehealth: Payer: Self-pay | Admitting: Endocrinology

## 2018-10-05 NOTE — Telephone Encounter (Signed)
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

## 2018-10-06 ENCOUNTER — Encounter: Payer: Self-pay | Admitting: Endocrinology

## 2018-10-07 NOTE — Telephone Encounter (Signed)
Have not seen paperwork so left pt vm to return my call to discuss what to do next or if he can bring [place] another copy

## 2018-10-07 NOTE — Telephone Encounter (Signed)
Pt stated that DMV has received paperwork

## 2018-10-07 NOTE — Telephone Encounter (Signed)
Spoke to pt and he will have another copy faxed to the office

## 2018-10-12 ENCOUNTER — Ambulatory Visit: Payer: Medicare HMO | Admitting: Endocrinology

## 2018-10-12 ENCOUNTER — Encounter: Payer: Self-pay | Admitting: Endocrinology

## 2018-10-12 VITALS — BP 112/68 | HR 64 | Ht 75.0 in | Wt 209.2 lb

## 2018-10-12 DIAGNOSIS — E1065 Type 1 diabetes mellitus with hyperglycemia: Secondary | ICD-10-CM

## 2018-10-12 DIAGNOSIS — E108 Type 1 diabetes mellitus with unspecified complications: Secondary | ICD-10-CM | POA: Diagnosis not present

## 2018-10-12 DIAGNOSIS — IMO0002 Reserved for concepts with insufficient information to code with codable children: Secondary | ICD-10-CM

## 2018-10-12 LAB — POCT GLYCOSYLATED HEMOGLOBIN (HGB A1C): Hemoglobin A1C: 9.3 % — AB (ref 4.0–5.6)

## 2018-10-12 MED ORDER — FREESTYLE LIBRE 14 DAY SENSOR MISC: 1.0000 | 3 refills | Status: DC

## 2018-10-12 MED ORDER — FREESTYLE LIBRE 14 DAY READER DEVI: 1.0000 | 0 refills | Status: DC

## 2018-10-12 NOTE — Patient Instructions (Addendum)
check your blood sugar twice a day.  vary the time of day when you check, between before the 3 meals, and at bedtime.  also check if you have symptoms of your blood sugar being too high or too low.  please keep a record of the readings and bring it to your next appointment here (or you can bring the meter itself).  You can write it on any piece of paper.  please call us sooner if your blood sugar goes below 70, or if you have a lot of readings over 200.   Please increase the NPH insulin to 70 units each morning.   However, if you are going to be active, take just 50 units that morning--this is important to avoid low blood sugar.  If you are unable to anticipate the activity, eat a light snack with it  On this type of insulin schedule, you should eat meals on a regular schedule--especially lunch.  If a meal is missed or significantly delayed, your blood sugar could go low.   Please come back for a follow-up appointment in 3 months.

## 2018-10-12 NOTE — Progress Notes (Signed)
Subjective:    Patient ID: Christopher [name], male    DOB: July 04, 1946, 72 y.o.   MRN: [id number]  HPI Pt returns for f/u of diabetes mellitus:  DM type: 1 Dx'ed: 2010.   Complications: polyneuropathy and renal insuff.   Therapy: insulin since 2013.   DKA: never.   Severe hypoglycemia: last episode was in 2018.   Pancreatitis: never.  Other: he is back on QD insulin, after noncompliance with multiple daily injections; he could not take lantus, due to AM hypoglycemia and PM hyperglycemia.  Interval history: I reviewed continuous glucose monitor data.  Glucose varies from 50-400.  It is in general lowest fasting.  It is also lower in the afternoon.  Pt says It is lowest in the afternoon when he is active.  He has not reduced insulin for activity.  Past Medical History:  Diagnosis Date  . Diabetes mellitus type II   . GERD (gastroesophageal reflux disease)   . Glaucoma   . Hyperlipidemia     Past Surgical History:  Procedure Laterality Date  . HERNIA REPAIR    . [place]    Social History   Socioeconomic History  . Marital status: Married    Spouse name: Not on file  . Number of children: Not on file  . Years of education: Not on file  . Highest education level: Not on file  Occupational History  . Not on file  Social Needs  . Financial resource strain: Not on file  . Food insecurity:    Worry: Not on file    Inability: Not on file  . Transportation needs:    Medical: Not on file    Non-medical: Not on file  Tobacco Use  . Smoking status: Never Smoker  . Smokeless tobacco: Never Used  Substance and Sexual Activity  . Alcohol use: No  . Drug use: No  . Sexual activity: Not on file  Lifestyle  . Physical activity:    Days per week: Not on file    Minutes per session: Not on file  . Stress: Not on file  Relationships  . Social connections:    Talks on phone: Not on file    Gets together: Not on file    Attends religious service: Not on file     Active member of club or organization: Not on file    Attends meetings of clubs or organizations: Not on file    Relationship status: Not on file  . Intimate partner violence:    Fear of current or ex partner: Not on file    Emotionally abused: Not on file    Physically abused: Not on file    Forced sexual activity: Not on file  Other Topics Concern  . Not on file  Social History Narrative  . Not on file    Current Outpatient Medications on File Prior to Visit  Medication Sig Dispense Refill  . Alcohol Swabs (B-D SINGLE USE SWABS REGULAR) PADS USE TO CHECK BLOOD SUGAR 2 TIMES PER DAY 200 each prn  . ALPHAGAN P 0.1 % SOLN Place 1 drop into both eyes daily.     Marland Kitchen aspirin EC 81 MG EC tablet Take 81 mg by mouth daily.      . dorzolamide-timolol (COSOPT) 22.3-6.8 MG/ML ophthalmic solution Place 1 drop into both eyes 2 (two) times daily.     Marland Kitchen glucagon 1 MG injection Inject 1 mg into the muscle once as needed. (Patient taking differently:  Inject 1 mg into the muscle once as needed (for blood sugar). ) 1 each 12  . hydrochlorothiazide (HYDRODIURIL) 25 MG tablet Take 1 tablet (25 mg total) by mouth daily. 90 tablet 4  . insulin NPH Human (NOVOLIN N RELION) 100 UNIT/ML injection Inject 0.75 mLs (75 Units total) into the skin every morning. 30 mL 11  . Insulin Syringe-Needle U-100 (INSULIN SYRINGE 1CC/31GX5/16") 31G X 5/16" 1 ML MISC USE TO INJECT INSULIN ONCE A DAY 100 each 0  . lisinopril (PRINIVIL,ZESTRIL) 2.5 MG tablet Take 1 tablet (2.5 mg total) by mouth daily. 90 tablet 4  . Multiple Vitamins-Minerals (CENTRUM SILVER ULTRA MENS PO) Take 1 tablet by mouth daily.     . sildenafil (REVATIO) 20 MG tablet Take 1 to 2 tabs 2 - 3 hours before sex 20 tablet 11  . simvastatin (ZOCOR) 80 MG tablet Take 1 tablet (80 mg total) by mouth every evening. 90 tablet 4   No current facility-administered medications on file prior to visit.     No Known Allergies  Family History  Problem Relation Age  of Onset  . Alcohol abuse Father   . Diabetes Sister     BP 112/68 (BP Location: Left Arm, Patient Position: Sitting, Cuff Size: Normal)   Pulse 64   Ht 6\' 3"  (1.905 m)   Wt 209 lb 3.2 oz (94.9 kg)   SpO2 96%   BMI 26.15 kg/m   Review of Systems Denies LOC    Objective:   Physical Exam VITAL SIGNS:  See vs page GENERAL: no distress Pulses: dorsalis pedis intact bilat.   MSK: no deformity of the feet CV: no leg edema Skin:  no ulcer on the feet.  normal color and temp on the feet. Neuro: sensation is intact to touch on the feet  Lab Results  Component Value Date   HGBA1C 9.3 (A) 10/12/2018   Lab Results  Component Value Date   CREATININE 1.31 08/26/2018   BUN 20 08/26/2018   NA 136 08/26/2018   K 3.7 08/26/2018   CL 101 08/26/2018   CO2 29 08/26/2018      Assessment & Plan:  Type 1 DM, with polyneuropathy: worse Renal insuff: in this setting, he does not need a slower-acting qd insulin Hypoglycemia: he needs to adjust insulin for activity  Patient Instructions  check your blood sugar twice a day.  vary the time of day when you check, between before the 3 meals, and at bedtime.  also check if you have symptoms of your blood sugar being too high or too low.  please keep a record of the readings and bring it to your next appointment here (or you can bring the meter itself).  You can write it on any piece of paper.  please call us sooner if your blood sugar goes below 70, or if you have a lot of readings over 200.   Please increase the NPH insulin to 70 units each morning.   However, if you are going to be active, take just 50 units that morning--this is important to avoid low blood sugar.  If you are unable to anticipate the activity, eat a light snack with it  On this type of insulin schedule, you should eat meals on a regular schedule--especially lunch.  If a meal is missed or significantly delayed, your blood sugar could go low.   Please come back for a follow-up  appointment in 3 months.

## 2018-10-13 ENCOUNTER — Telehealth: Payer: Self-pay

## 2018-10-13 DIAGNOSIS — Z1211 Encounter for screening for malignant neoplasm of colon: Secondary | ICD-10-CM

## 2018-10-13 NOTE — Telephone Encounter (Signed)
Copied from [place] [id number]. Topic: General - Inquiry >> Oct 12, 2018 12:13 PM Rutherford Nail, NT wrote: Reason for CRM: Patient received Emmi Prevent call regarding colonoscopy. States that he would prefer to do the testing at home if possible. Please advise.  CB#: [phone]

## 2018-10-13 NOTE — Telephone Encounter (Signed)
Copied from [place] [id number]. Topic: General - Inquiry >> Oct 12, 2018 12:13 PM Rutherford Nail, NT wrote: Reason for CRM: Patient received Emmi Prevent call regarding colonoscopy. States that he would prefer to do the testing at home if possible. Please advise.  CB#: [phone]

## 2018-10-15 NOTE — Telephone Encounter (Signed)
Cologuard has been faxed to [facility].

## 2018-12-15 DIAGNOSIS — H401131 Primary open-angle glaucoma, bilateral, mild stage: Secondary | ICD-10-CM | POA: Diagnosis not present

## 2018-12-17 ENCOUNTER — Other Ambulatory Visit: Payer: Self-pay

## 2018-12-17 ENCOUNTER — Telehealth: Payer: Self-pay | Admitting: Endocrinology

## 2018-12-17 MED ORDER — "INSULIN SYRINGE 31G X 5/16"" 1 ML MISC": 3 refills | Status: DC

## 2018-12-17 NOTE — Telephone Encounter (Signed)
MEDICATION: Insulin Syringe - Needle U-100  PHARMACY:  [facility] at [place] :yes  IS PATIENT OUT OF MEDICATION: no  IF NOT; HOW MUCH IS LEFT: 4 left  LAST APPOINTMENT DATE: @10 /28/2019  NEXT APPOINTMENT DATE:@2 /04/2019  DO WE HAVE YOUR PERMISSION TO LEAVE A DETAILED MESSAGE:  OTHER COMMENTS:    **Let patient know to contact pharmacy at the end of the day to make sure medication is ready. **  ** Please notify patient to allow 48-72 hours to process**  **Encourage patient to contact the pharmacy for refills or they can request refills through [facility]**

## 2018-12-17 NOTE — Telephone Encounter (Signed)
Rx sent 

## 2019-01-11 ENCOUNTER — Ambulatory Visit: Payer: Medicare HMO | Admitting: Endocrinology

## 2019-01-20 ENCOUNTER — Ambulatory Visit (INDEPENDENT_AMBULATORY_CARE_PROVIDER_SITE_OTHER): Payer: Medicare HMO | Admitting: Endocrinology

## 2019-01-20 ENCOUNTER — Encounter: Payer: Self-pay | Admitting: Endocrinology

## 2019-01-20 VITALS — BP 110/70 | HR 68 | Ht 75.0 in | Wt 211.0 lb

## 2019-01-20 DIAGNOSIS — E108 Type 1 diabetes mellitus with unspecified complications: Secondary | ICD-10-CM

## 2019-01-20 DIAGNOSIS — E1065 Type 1 diabetes mellitus with hyperglycemia: Secondary | ICD-10-CM | POA: Diagnosis not present

## 2019-01-20 DIAGNOSIS — IMO0002 Reserved for concepts with insufficient information to code with codable children: Secondary | ICD-10-CM

## 2019-01-20 LAB — POCT GLYCOSYLATED HEMOGLOBIN (HGB A1C): Hemoglobin A1C: 9.6 % — AB (ref 4.0–5.6)

## 2019-01-20 MED ORDER — INSULIN NPH ISOPHANE & REGULAR (70-30) 100 UNIT/ML ~~LOC~~ SUSP: 70.0000 [IU] | Freq: Every day | SUBCUTANEOUS | 11 refills | Status: DC

## 2019-01-20 NOTE — Patient Instructions (Signed)
check your blood sugar twice a day.  vary the time of day when you check, between before the 3 meals, and at bedtime.  also check if you have symptoms of your blood sugar being too high or too low.  please keep a record of the readings and bring it to your next appointment here (or you can bring the meter itself).  You can write it on any piece of paper.  please call us sooner if your blood sugar goes below 70, or if you have a lot of readings over 200.   Please change the NPH insulin to "70/30," 70 units with breakfast.   However, if you are going to be active, take just 50 units that morning--this is important to avoid low blood sugar.  If you are unable to anticipate the activity, eat a light snack with it  On this type of insulin schedule, you should eat meals on a regular schedule.  If a meal is missed or significantly delayed, your blood sugar could go low.   Please come back for a follow-up appointment in 2 months.

## 2019-01-20 NOTE — Progress Notes (Signed)
Subjective:    Patient ID: Christopher [name], male    DOB: 1946-02-22, 73 y.o.   MRN: [id number]  HPI Pt returns for f/u of diabetes mellitus:  DM type: 1 Dx'ed: 2010.   Complications: polyneuropathy and renal insuff.   Therapy: insulin since 2013.   DKA: never.   Severe hypoglycemia: last episode was in 2018.   Pancreatitis: never.  Other: he is back on QD insulin, after noncompliance with multiple daily injections; he could not take lantus, due to AM hypoglycemia and PM hyperglycemia.  Interval history: I reviewed continuous glucose monitor data.  Glucose varies from 50-400.  It is in general lowest fasting.  Pt says It is lowest in the afternoon when he is active.  Past Medical History:  Diagnosis Date  . Diabetes mellitus type II   . GERD (gastroesophageal reflux disease)   . Glaucoma   . Hyperlipidemia     Past Surgical History:  Procedure Laterality Date  . HERNIA REPAIR    . [place]    Social History   Socioeconomic History  . Marital status: Married    Spouse name: Not on file  . Number of children: Not on file  . Years of education: Not on file  . Highest education level: Not on file  Occupational History  . Not on file  Social Needs  . Financial resource strain: Not on file  . Food insecurity:    Worry: Not on file    Inability: Not on file  . Transportation needs:    Medical: Not on file    Non-medical: Not on file  Tobacco Use  . Smoking status: Never Smoker  . Smokeless tobacco: Never Used  Substance and Sexual Activity  . Alcohol use: No  . Drug use: No  . Sexual activity: Not on file  Lifestyle  . Physical activity:    Days per week: Not on file    Minutes per session: Not on file  . Stress: Not on file  Relationships  . Social connections:    Talks on phone: Not on file    Gets together: Not on file    Attends religious service: Not on file    Active member of club or organization: Not on file    Attends meetings  of clubs or organizations: Not on file    Relationship status: Not on file  . Intimate partner violence:    Fear of current or ex partner: Not on file    Emotionally abused: Not on file    Physically abused: Not on file    Forced sexual activity: Not on file  Other Topics Concern  . Not on file  Social History Narrative  . Not on file    Current Outpatient Medications on File Prior to Visit  Medication Sig Dispense Refill  . Alcohol Swabs (B-D SINGLE USE SWABS REGULAR) PADS USE TO CHECK BLOOD SUGAR 2 TIMES PER DAY 200 each prn  . ALPHAGAN P 0.1 % SOLN Place 1 drop into both eyes daily.     Marland Kitchen aspirin EC 81 MG EC tablet Take 81 mg by mouth daily.      . Continuous Blood Gluc Receiver (FREESTYLE LIBRE 14 DAY READER) DEVI 1 Device by Other route See admin instructions. 1 Device 0  . Continuous Blood Gluc Sensor (FREESTYLE LIBRE 14 DAY SENSOR) MISC 1 Device by Does not apply route every 14 (fourteen) days. 6 each 3  . dorzolamide-timolol (COSOPT) 22.3-6.8 MG/ML  ophthalmic solution Place 1 drop into both eyes 2 (two) times daily.     Marland Kitchen glucagon 1 MG injection Inject 1 mg into the muscle once as needed. (Patient taking differently: Inject 1 mg into the muscle once as needed (for blood sugar). ) 1 each 12  . hydrochlorothiazide (HYDRODIURIL) 25 MG tablet Take 1 tablet (25 mg total) by mouth daily. 90 tablet 4  . Insulin Syringe-Needle U-100 (INSULIN SYRINGE 1CC/31GX5/16") 31G X 5/16" 1 ML MISC USE TO INJECT INSULIN ONCE A DAY 100 each 3  . lisinopril (PRINIVIL,ZESTRIL) 2.5 MG tablet Take 1 tablet (2.5 mg total) by mouth daily. 90 tablet 4  . Multiple Vitamins-Minerals (CENTRUM SILVER ULTRA MENS PO) Take 1 tablet by mouth daily.     . sildenafil (REVATIO) 20 MG tablet Take 1 to 2 tabs 2 - 3 hours before sex 20 tablet 11  . simvastatin (ZOCOR) 80 MG tablet Take 1 tablet (80 mg total) by mouth every evening. 90 tablet 4   No current facility-administered medications on file prior to visit.      No Known Allergies  Family History  Problem Relation Age of Onset  . Alcohol abuse Father   . Diabetes Sister     BP 110/70 (BP Location: Left Arm, Patient Position: Sitting, Cuff Size: Large)   Pulse 68   Ht 6\' 3"  (1.905 m)   Wt 211 lb (95.7 kg)   SpO2 93%   BMI 26.37 kg/m    Review of Systems Denies LOC    Objective:   Physical Exam VITAL SIGNS:  See vs page GENERAL: no distress Pulses: dorsalis pedis intact bilat.   MSK: no deformity of the feet CV: no leg edema Skin:  no ulcer on the feet.  normal color and temp on the feet. Neuro: sensation is intact to touch on the feet  Lab Results  Component Value Date   HGBA1C 9.6 (A) 01/20/2019   Lab Results  Component Value Date   CREATININE 1.31 08/26/2018   BUN 20 08/26/2018   NA 136 08/26/2018   K 3.7 08/26/2018   CL 101 08/26/2018   CO2 29 08/26/2018        Assessment & Plan:  type 1 DM, with DM: worse.  Renal insuff: in this setting, he needs a faster-acting qd insulin.    Patient Instructions  check your blood sugar twice a day.  vary the time of day when you check, between before the 3 meals, and at bedtime.  also check if you have symptoms of your blood sugar being too high or too low.  please keep a record of the readings and bring it to your next appointment here (or you can bring the meter itself).  You can write it on any piece of paper.  please call us sooner if your blood sugar goes below 70, or if you have a lot of readings over 200.   Please change the NPH insulin to "70/30," 70 units with breakfast.   However, if you are going to be active, take just 50 units that morning--this is important to avoid low blood sugar.  If you are unable to anticipate the activity, eat a light snack with it  On this type of insulin schedule, you should eat meals on a regular schedule.  If a meal is missed or significantly delayed, your blood sugar could go low.   Please come back for a follow-up appointment in 2  months.

## 2019-02-22 ENCOUNTER — Ambulatory Visit (INDEPENDENT_AMBULATORY_CARE_PROVIDER_SITE_OTHER): Payer: Medicare HMO | Admitting: Endocrinology

## 2019-02-22 ENCOUNTER — Telehealth: Payer: Self-pay

## 2019-02-22 ENCOUNTER — Encounter: Payer: Self-pay | Admitting: Endocrinology

## 2019-02-22 ENCOUNTER — Other Ambulatory Visit: Payer: Self-pay

## 2019-02-22 VITALS — BP 112/80 | HR 72 | Ht 75.0 in | Wt 216.6 lb

## 2019-02-22 DIAGNOSIS — E108 Type 1 diabetes mellitus with unspecified complications: Secondary | ICD-10-CM | POA: Diagnosis not present

## 2019-02-22 MED ORDER — "PEN NEEDLES 5/16"" 30G X 8 MM MISC": 1.0000 | Freq: Every day | 3 refills | Status: DC

## 2019-02-22 MED ORDER — INSULIN LISPRO PROT & LISPRO (50-50 MIX) 100 UNIT/ML KWIKPEN: 70.0000 [IU] | PEN_INJECTOR | Freq: Every day | SUBCUTANEOUS | 11 refills | Status: DC

## 2019-02-22 NOTE — Patient Instructions (Addendum)
check your blood sugar twice a day.  vary the time of day when you check, between before the 3 meals, and at bedtime.  also check if you have symptoms of your blood sugar being too high or too low.  please keep a record of the readings and bring it to your next appointment here (or you can bring the meter itself).  You can write it on any piece of paper.  please call us sooner if your blood sugar goes below 70, or if you have a lot of readings over 200.   Please change the insulin to "50/50," 70 units with breakfast.   However, if you are going to be active, take just 50 units that morning--this is important to avoid low blood sugar.  If you are unable to anticipate the activity, eat a light snack with it.  On this type of insulin schedule, you should eat meals on a regular schedule.  If a meal is missed or significantly delayed, your blood sugar could go low.   Please come back for a follow-up appointment in 2 months.

## 2019-02-22 NOTE — Progress Notes (Signed)
Subjective:    Patient ID: Christopher [name], male    DOB: Aug 23, 1946, 73 y.o.   MRN: [id number]  HPI Pt returns for f/u of diabetes mellitus:  DM type: 1 Dx'ed: 2010.   Complications: polyneuropathy and renal insuff.   Therapy: insulin since 2013.   DKA: never.   Severe hypoglycemia: last episode was in 2018.   Pancreatitis: never.  Other: he is back on QD insulin, after noncompliance with multiple daily injections; he could not take lantus, due to AM hypoglycemia and PM hyperglycemia.  Interval history: I reviewed continuous glucose monitor data.  Glucose varies from 50-400's.  It is in general lowest fasting, but is can also be low in the afternoon.  Pt says It is lowest in the afternoon when he is active.  He has not been reducing the insulin to 50/d, for activity.     Past Medical History:  Diagnosis Date  . Diabetes mellitus type II   . GERD (gastroesophageal reflux disease)   . Glaucoma   . Hyperlipidemia     Past Surgical History:  Procedure Laterality Date  . HERNIA REPAIR    . [place]    Social History   Socioeconomic History  . Marital status: Married    Spouse name: Not on file  . Number of children: Not on file  . Years of education: Not on file  . Highest education level: Not on file  Occupational History  . Not on file  Social Needs  . Financial resource strain: Not on file  . Food insecurity:    Worry: Not on file    Inability: Not on file  . Transportation needs:    Medical: Not on file    Non-medical: Not on file  Tobacco Use  . Smoking status: Never Smoker  . Smokeless tobacco: Never Used  Substance and Sexual Activity  . Alcohol use: No  . Drug use: No  . Sexual activity: Not on file  Lifestyle  . Physical activity:    Days per week: Not on file    Minutes per session: Not on file  . Stress: Not on file  Relationships  . Social connections:    Talks on phone: Not on file    Gets together: Not on file    Attends  religious service: Not on file    Active member of club or organization: Not on file    Attends meetings of clubs or organizations: Not on file    Relationship status: Not on file  . Intimate partner violence:    Fear of current or ex partner: Not on file    Emotionally abused: Not on file    Physically abused: Not on file    Forced sexual activity: Not on file  Other Topics Concern  . Not on file  Social History Narrative  . Not on file    Current Outpatient Medications on File Prior to Visit  Medication Sig Dispense Refill  . Alcohol Swabs (B-D SINGLE USE SWABS REGULAR) PADS USE TO CHECK BLOOD SUGAR 2 TIMES PER DAY 200 each prn  . ALPHAGAN P 0.1 % SOLN Place 1 drop into both eyes daily.     Marland Kitchen aspirin EC 81 MG EC tablet Take 81 mg by mouth daily.      . Continuous Blood Gluc Receiver (FREESTYLE LIBRE 14 DAY READER) DEVI 1 Device by Other route See admin instructions. 1 Device 0  . Continuous Blood Gluc Sensor (FREESTYLE LIBRE  14 DAY SENSOR) MISC 1 Device by Does not apply route every 14 (fourteen) days. 6 each 3  . dorzolamide-timolol (COSOPT) 22.3-6.8 MG/ML ophthalmic solution Place 1 drop into both eyes 2 (two) times daily.     Marland Kitchen glucagon 1 MG injection Inject 1 mg into the muscle once as needed. (Patient taking differently: Inject 1 mg into the muscle once as needed (for blood sugar). ) 1 each 12  . hydrochlorothiazide (HYDRODIURIL) 25 MG tablet Take 1 tablet (25 mg total) by mouth daily. 90 tablet 4  . lisinopril (PRINIVIL,ZESTRIL) 2.5 MG tablet Take 1 tablet (2.5 mg total) by mouth daily. 90 tablet 4  . Multiple Vitamins-Minerals (CENTRUM SILVER ULTRA MENS PO) Take 1 tablet by mouth daily.     . sildenafil (REVATIO) 20 MG tablet Take 1 to 2 tabs 2 - 3 hours before sex 20 tablet 11  . simvastatin (ZOCOR) 80 MG tablet Take 1 tablet (80 mg total) by mouth every evening. 90 tablet 4   No current facility-administered medications on file prior to visit.     No Known  Allergies  Family History  Problem Relation Age of Onset  . Alcohol abuse Father   . Diabetes Sister     BP 112/80 (BP Location: Left Arm, Patient Position: Sitting, Cuff Size: Normal)   Pulse 72   Ht 6\' 3"  (1.905 m)   Wt 216 lb 9.6 oz (98.2 kg)   SpO2 97%   BMI 27.07 kg/m    Review of Systems Denies LOC.      Objective:   Physical Exam VITAL SIGNS:  See vs page.   GENERAL: no distress.    Lab Results  Component Value Date   CREATININE 1.31 08/26/2018   BUN 20 08/26/2018   NA 136 08/26/2018   K 3.7 08/26/2018   CL 101 08/26/2018   CO2 29 08/26/2018       Assessment & Plan:  type 1 DM: The pattern of his cbg's indicates he needs some adjustment in his therapy.    Patient Instructions  check your blood sugar twice a day.  vary the time of day when you check, between before the 3 meals, and at bedtime.  also check if you have symptoms of your blood sugar being too high or too low.  please keep a record of the readings and bring it to your next appointment here (or you can bring the meter itself).  You can write it on any piece of paper.  please call us sooner if your blood sugar goes below 70, or if you have a lot of readings over 200.   Please change the insulin to "50/50," 70 units with breakfast.   However, if you are going to be active, take just 50 units that morning--this is important to avoid low blood sugar.  If you are unable to anticipate the activity, eat a light snack with it.  On this type of insulin schedule, you should eat meals on a regular schedule.  If a meal is missed or significantly delayed, your blood sugar could go low.   Please come back for a follow-up appointment in 2 months.

## 2019-02-22 NOTE — Telephone Encounter (Signed)
PA initiated today through Cover My Meds for Humalog 50/50 Kwikpen. Will await insurance response re: approval/denial.  Karleen Hampshire Key: [id number] PA Case ID: [id number] Rx #: [id number]  Need help? Call us at [phone]  Status: Sent to Plan today  Drug: HumaLOG Mix 50/50 KwikPen (50-50) 100UNIT/ML pen-injectors  Form: Administrator, sports PA Form  Original Claim [place] Plan's Preferred Products  NOVOLOG MIX 70-30 FLEXPEN [id number]  NOVOLOG MIX 70-30    Failed above alternatives resulting in fasting hypoglycemia

## 2019-02-23 NOTE — Telephone Encounter (Signed)
Received notification from [facility] that [place] for [place] has been approved 02/22/19 through 12/16/19. Documents have been labeled and placed in scan file for HIM and for our future reference.

## 2019-02-26 ENCOUNTER — Encounter: Payer: Medicare HMO | Admitting: Internal Medicine

## 2019-03-05 ENCOUNTER — Other Ambulatory Visit: Payer: Self-pay

## 2019-03-05 ENCOUNTER — Ambulatory Visit (INDEPENDENT_AMBULATORY_CARE_PROVIDER_SITE_OTHER): Payer: Medicare HMO | Admitting: Internal Medicine

## 2019-03-05 ENCOUNTER — Encounter: Payer: Self-pay | Admitting: Internal Medicine

## 2019-03-05 ENCOUNTER — Encounter: Payer: Self-pay | Admitting: Gastroenterology

## 2019-03-05 VITALS — BP 110/72 | HR 66 | Temp 97.8°F | Ht 75.0 in | Wt 213.7 lb

## 2019-03-05 DIAGNOSIS — I10 Essential (primary) hypertension: Secondary | ICD-10-CM | POA: Diagnosis not present

## 2019-03-05 DIAGNOSIS — Z1211 Encounter for screening for malignant neoplasm of colon: Secondary | ICD-10-CM

## 2019-03-05 DIAGNOSIS — E108 Type 1 diabetes mellitus with unspecified complications: Secondary | ICD-10-CM

## 2019-03-05 DIAGNOSIS — E785 Hyperlipidemia, unspecified: Secondary | ICD-10-CM | POA: Diagnosis not present

## 2019-03-05 DIAGNOSIS — H409 Unspecified glaucoma: Secondary | ICD-10-CM

## 2019-03-05 MED ORDER — ATORVASTATIN CALCIUM 80 MG PO TABS: 80.0000 mg | ORAL_TABLET | Freq: Every day | ORAL | 3 refills | Status: DC

## 2019-03-05 NOTE — Progress Notes (Signed)
Established Patient Office Visit     CC/Reason for Visit: Establish care, follow up chronic conditions  HPI: Christopher [name] is a 73 y.o. male who is coming in today for the above mentioned reasons. Past Medical History is significant for: HTN well controlled, HLD not controlled,not on medications, Glaucoma followed by Allen Kell, labile IDDM followed by endocrinology (Dr. Raynelle Fanning). He has no acute complaints today.   Past Medical/Surgical History: Past Medical History:  Diagnosis Date  . Diabetes mellitus type II   . GERD (gastroesophageal reflux disease)   . Glaucoma   . Hyperlipidemia     Past Surgical History:  Procedure Laterality Date  . HERNIA REPAIR    . [place]    Social History:  reports that he has never smoked. He has never used smokeless tobacco. He reports that he does not drink alcohol or use drugs.  Allergies: No Known Allergies  Family History:  Family History  Problem Relation Age of Onset  . Alcohol abuse Father   . Diabetes Sister      Current Outpatient Medications:  .  Alcohol Swabs (B-D SINGLE USE SWABS REGULAR) PADS, USE TO CHECK BLOOD SUGAR 2 TIMES PER DAY, Disp: 200 each, Rfl: prn .  ALPHAGAN P 0.1 % SOLN, Place 1 drop into both eyes daily. , Disp: , Rfl:  .  aspirin EC 81 MG EC tablet, Take 81 mg by mouth daily.  , Disp: , Rfl:  .  Continuous Blood Gluc Receiver (FREESTYLE LIBRE 14 DAY READER) DEVI, 1 Device by Other route See admin instructions., Disp: 1 Device, Rfl: 0 .  Continuous Blood Gluc Sensor (FREESTYLE LIBRE 14 DAY SENSOR) MISC, 1 Device by Does not apply route every 14 (fourteen) days., Disp: 6 each, Rfl: 3 .  dorzolamide-timolol (COSOPT) 22.3-6.8 MG/ML ophthalmic solution, Place 1 drop into both eyes 2 (two) times daily. , Disp: , Rfl:  .  glucagon 1 MG injection, Inject 1 mg into the muscle once as needed. (Patient taking differently: Inject 1 mg into the muscle once as needed (for blood sugar). ),  Disp: 1 each, Rfl: 12 .  hydrochlorothiazide (HYDRODIURIL) 25 MG tablet, Take 1 tablet (25 mg total) by mouth daily., Disp: 90 tablet, Rfl: 4 .  Insulin Lispro Prot & Lispro (HUMALOG MIX 50/50 KWIKPEN) (50-50) 100 UNIT/ML Kwikpen, Inject 70 Units into the skin daily with breakfast., Disp: 10 pen, Rfl: 11 .  Insulin Pen Needle (PEN NEEDLES 5/16") 30G X 8 MM MISC, 1 Device by Does not apply route daily., Disp: 90 each, Rfl: 3 .  lisinopril (PRINIVIL,ZESTRIL) 2.5 MG tablet, Take 1 tablet (2.5 mg total) by mouth daily., Disp: 90 tablet, Rfl: 4 .  Multiple Vitamins-Minerals (CENTRUM SILVER ULTRA MENS PO), Take 1 tablet by mouth daily. , Disp: , Rfl:  .  sildenafil (REVATIO) 20 MG tablet, Take 1 to 2 tabs 2 - 3 hours before sex, Disp: 20 tablet, Rfl: 11 .  atorvastatin (LIPITOR) 80 MG tablet, Take 1 tablet (80 mg total) by mouth daily., Disp: 90 tablet, Rfl: 3 .  simvastatin (ZOCOR) 80 MG tablet, Take 1 tablet (80 mg total) by mouth every evening., Disp: 90 tablet, Rfl: 4  Review of Systems:  Constitutional: Denies fever, chills, diaphoresis, appetite change and fatigue.  HEENT: Denies photophobia, eye pain, redness, hearing loss, ear pain, congestion, sore throat, rhinorrhea, sneezing, mouth sores, trouble swallowing, neck pain, neck stiffness and tinnitus.   Respiratory: Denies SOB, DOE, cough, chest  tightness,  and wheezing.   Cardiovascular: Denies chest pain, palpitations and leg swelling.  Gastrointestinal: Denies nausea, vomiting, abdominal pain, diarrhea, constipation, blood in stool and abdominal distention.  Genitourinary: Denies dysuria, urgency, frequency, hematuria, flank pain and difficulty urinating.  Endocrine: Denies: hot or cold intolerance, sweats, changes in hair or nails, polyuria, polydipsia. Musculoskeletal: Denies myalgias, back pain, joint swelling, arthralgias and gait problem.  Skin: Denies pallor, rash and wound.  Neurological: Denies dizziness, seizures, syncope, weakness,  light-headedness, numbness and headaches.  Hematological: Denies adenopathy. Easy bruising, personal or family bleeding history  Psychiatric/Behavioral: Denies suicidal ideation, mood changes, confusion, nervousness, sleep disturbance and agitation    Physical Exam: Vitals:   03/05/19 0802  BP: 110/72  Pulse: 66  Temp: 97.8 F (36.6 C)  TempSrc: Oral  SpO2: 96%  Weight: 213 lb 11.2 oz (96.9 kg)  Height: 6\' 3"  (1.905 m)    Body mass index is 26.71 kg/m.   Constitutional: NAD, calm, comfortable Eyes: PERRL, lids and conjunctivae normal ENMT: Mucous membranes are moist.  Respiratory: clear to auscultation bilaterally, no wheezing, no crackles. Normal respiratory effort. No accessory muscle use.  Cardiovascular: Regular rate and rhythm, no murmurs / rubs / gallops. No extremity edema. 2+ pedal pulses. No carotid bruits.  Abdomen: no tenderness, no masses palpated. No hepatosplenomegaly. Bowel sounds positive.  Musculoskeletal: no clubbing / cyanosis. No joint deformity upper and lower extremities. Good ROM, no contractures. Normal muscle tone.  Skin: no rashes, lesions, ulcers. No induration Neurologic: CN 2-12 grossly intact. Sensation intact, DTR normal. Strength 5/5 in all 4.  Psychiatric: Normal judgment and insight. Alert and oriented x 3. Normal mood.    Impression and Plan:  Screening for colon cancer - Plan: Ambulatory referral to Gastroenterology  Hyperlipidemia, unspecified hyperlipidemia type -LDL 159 in 9/19; goal <70 given DM. -Start lipitor 80 mg at bedtime.  Glaucoma, unspecified glaucoma type, unspecified laterality -Follows routinely with eye MD.  Type 1 diabetes mellitus with unspecified complications ([place]) -Very labile, followed by endocrine. Now with continuous blood glucose monitor. -Last A1c 9.6 in 2/20.  Essential hypertension -Well controlled on lisinopril/HCTZ.    Patient Instructions  -Nice meeting you today!!  -Start lipitor 80 mg at  bedtime for cholesterol.  -Schedule follow up in 4 months.     Lelon Frohlich, MD [facility]

## 2019-03-05 NOTE — Patient Instructions (Signed)
-  Nice meeting you today!!  -Start lipitor 80 mg at bedtime for cholesterol.  -Schedule follow up in 4 months.

## 2019-03-19 ENCOUNTER — Ambulatory Visit: Payer: Medicare HMO | Admitting: Endocrinology

## 2019-04-13 ENCOUNTER — Encounter: Payer: Medicare HMO | Admitting: Gastroenterology

## 2019-04-26 ENCOUNTER — Ambulatory Visit (INDEPENDENT_AMBULATORY_CARE_PROVIDER_SITE_OTHER): Payer: Medicare HMO | Admitting: Endocrinology

## 2019-04-26 ENCOUNTER — Encounter: Payer: Self-pay | Admitting: Endocrinology

## 2019-04-26 ENCOUNTER — Other Ambulatory Visit: Payer: Self-pay

## 2019-04-26 VITALS — BP 112/70 | HR 69 | Ht 75.0 in | Wt 209.8 lb

## 2019-04-26 DIAGNOSIS — E108 Type 1 diabetes mellitus with unspecified complications: Secondary | ICD-10-CM

## 2019-04-26 DIAGNOSIS — E1029 Type 1 diabetes mellitus with other diabetic kidney complication: Secondary | ICD-10-CM | POA: Diagnosis not present

## 2019-04-26 LAB — POCT GLYCOSYLATED HEMOGLOBIN (HGB A1C): Hemoglobin A1C: 10 % — AB (ref 4.0–5.6)

## 2019-04-26 MED ORDER — INSULIN LISPRO PROT & LISPRO (50-50 MIX) 100 UNIT/ML ~~LOC~~ SUSP: 70.0000 [IU] | Freq: Every day | SUBCUTANEOUS | 3 refills | Status: DC

## 2019-04-26 NOTE — Patient Instructions (Addendum)
check your blood sugar twice a day.  vary the time of day when you check, between before the 3 meals, and at bedtime.  also check if you have symptoms of your blood sugar being too high or too low.  please keep a record of the readings and bring it to your next appointment here (or you can bring the meter itself).  You can write it on any piece of paper.  please call us sooner if your blood sugar goes below 70, or if you have a lot of readings over 200.   I have sent a prescription to your pharmacy, to change the insulin to vials.   However, if you are going to be active, take just 50 units that morning--this is important to avoid low blood sugar.  If you are unable to anticipate the activity, eat a light snack with it.  On this type of insulin schedule, you should eat meals on a regular schedule.  If a meal is missed or significantly delayed, your blood sugar could go low.   Please come back for a follow-up appointment in 2 months.

## 2019-04-26 NOTE — Progress Notes (Signed)
Subjective:    Patient ID: Christopher [name], male    DOB: 1946-11-19, 73 y.o.   MRN: [id number]  HPI Pt returns for f/u of diabetes mellitus:  DM type: 1 Dx'ed: 2010.   Complications: polyneuropathy and renal insuff.   Therapy: insulin since 2013.   DKA: never.   Severe hypoglycemia: last episode was in 2018.   Pancreatitis: never.  Other: he is back on QD insulin, after noncompliance with multiple daily injections; he could not take lantus, due to AM hypoglycemia and PM hyperglycemia.  Interval history: Pt says he is having difficulty injecting insulin from the pen.  no cbg record, but states cbg's vary from 67-400.  There is no trend throughout the day.   Past Medical History:  Diagnosis Date   Diabetes mellitus type II    GERD (gastroesophageal reflux disease)    Glaucoma    Hyperlipidemia     Past Surgical History:  Procedure Laterality Date   HERNIA REPAIR     PATELLA FRACTURE SURGERY  1993    Social History   Socioeconomic History   Marital status: Married    Spouse name: Not on file   Number of children: Not on file   Years of education: Not on file   Highest education level: Not on file  Occupational History   Not on file  Social Needs   Financial resource strain: Not on file   Food insecurity:    Worry: Not on file    Inability: Not on file   Transportation needs:    Medical: Not on file    Non-medical: Not on file  Tobacco Use   Smoking status: Never Smoker   Smokeless tobacco: Never Used  Substance and Sexual Activity   Alcohol use: No   Drug use: No   Sexual activity: Not on file  Lifestyle   Physical activity:    Days per week: Not on file    Minutes per session: Not on file   Stress: Not on file  Relationships   Social connections:    Talks on phone: Not on file    Gets together: Not on file    Attends religious service: Not on file    Active member of club or organization: Not on file    Attends meetings of clubs or  organizations: Not on file    Relationship status: Not on file   Intimate partner violence:    Fear of current or ex partner: Not on file    Emotionally abused: Not on file    Physically abused: Not on file    Forced sexual activity: Not on file  Other Topics Concern   Not on file  Social History Narrative   Not on file    Current Outpatient Medications on File Prior to Visit  Medication Sig Dispense Refill   Alcohol Swabs (B-D SINGLE USE SWABS REGULAR) PADS USE TO CHECK BLOOD SUGAR 2 TIMES PER DAY 200 each prn   ALPHAGAN P 0.1 % SOLN Place 1 drop into both eyes daily.      aspirin EC 81 MG EC tablet Take 81 mg by mouth daily.       atorvastatin (LIPITOR) 80 MG tablet Take 1 tablet (80 mg total) by mouth daily. 90 tablet 3   Continuous Blood Gluc Receiver (FREESTYLE LIBRE 14 DAY READER) DEVI 1 Device by Other route See admin instructions. 1 Device 0   Continuous Blood Gluc Sensor (FREESTYLE LIBRE 14 DAY SENSOR) MISC 1 Device  by Does not apply route every 14 (fourteen) days. 6 each 3   dorzolamide-timolol (COSOPT) 22.3-6.8 MG/ML ophthalmic solution Place 1 drop into both eyes 2 (two) times daily.      glucagon 1 MG injection Inject 1 mg into the muscle once as needed. (Patient taking differently: Inject 1 mg into the muscle once as needed (for blood sugar). ) 1 each 12   hydrochlorothiazide (HYDRODIURIL) 25 MG tablet Take 1 tablet (25 mg total) by mouth daily. 90 tablet 4   Insulin Pen Needle (PEN NEEDLES 5/16") 30G X 8 MM MISC 1 Device by Does not apply route daily. 90 each 3   lisinopril (PRINIVIL,ZESTRIL) 2.5 MG tablet Take 1 tablet (2.5 mg total) by mouth daily. 90 tablet 4   Multiple Vitamins-Minerals (CENTRUM SILVER ULTRA MENS PO) Take 1 tablet by mouth daily.      sildenafil (REVATIO) 20 MG tablet Take 1 to 2 tabs 2 - 3 hours before sex 20 tablet 11   simvastatin (ZOCOR) 80 MG tablet Take 1 tablet (80 mg total) by mouth every evening. 90 tablet 4   No current  facility-administered medications on file prior to visit.     No Known Allergies  Family History  Problem Relation Age of Onset   Alcohol abuse Father    Diabetes Sister     BP 112/70 (BP Location: Left Arm, Patient Position: Sitting, Cuff Size: Normal)    Pulse 69    Ht 6\' 3"  (1.905 m)    Wt 209 lb 12.8 oz (95.2 kg)    SpO2 96%    BMI 26.22 kg/m    Review of Systems He denies hypoglycemia.      Objective:   Physical Exam VITAL SIGNS:  See vs page GENERAL: no distress Pulses: dorsalis pedis intact bilat.   MSK: no deformity of the feet CV: no leg edema Skin:  no ulcer on the feet.  normal color and temp on the feet. Neuro: sensation is intact to touch on the feet  Lab Results  Component Value Date   HGBA1C 10.0 (A) 04/26/2019       Assessment & Plan:  Type 1 DM, with renal insuff: worse.    Device malfunction: we discussed.  he declines to see CDE about pen injections.  He requests to change to syringe and vial.    Patient Instructions  check your blood sugar twice a day.  vary the time of day when you check, between before the 3 meals, and at bedtime.  also check if you have symptoms of your blood sugar being too high or too low.  please keep a record of the readings and bring it to your next appointment here (or you can bring the meter itself).  You can write it on any piece of paper.  please call us sooner if your blood sugar goes below 70, or if you have a lot of readings over 200.   Please change the insulin to "50/50," 70 units with breakfast.   However, if you are going to be active, take just 50 units that morning--this is important to avoid low blood sugar.  If you are unable to anticipate the activity, eat a light snack with it.  On this type of insulin schedule, you should eat meals on a regular schedule.  If a meal is missed or significantly delayed, your blood sugar could go low.   Please come back for a follow-up appointment in 2 months.

## 2019-05-11 DIAGNOSIS — H401111 Primary open-angle glaucoma, right eye, mild stage: Secondary | ICD-10-CM | POA: Diagnosis not present

## 2019-05-11 DIAGNOSIS — H401121 Primary open-angle glaucoma, left eye, mild stage: Secondary | ICD-10-CM | POA: Diagnosis not present

## 2019-05-11 DIAGNOSIS — H401131 Primary open-angle glaucoma, bilateral, mild stage: Secondary | ICD-10-CM | POA: Diagnosis not present

## 2019-06-24 ENCOUNTER — Other Ambulatory Visit: Payer: Self-pay

## 2019-06-28 ENCOUNTER — Other Ambulatory Visit: Payer: Self-pay

## 2019-06-28 ENCOUNTER — Encounter: Payer: Self-pay | Admitting: Endocrinology

## 2019-06-28 ENCOUNTER — Ambulatory Visit (INDEPENDENT_AMBULATORY_CARE_PROVIDER_SITE_OTHER): Payer: Medicare HMO | Admitting: Endocrinology

## 2019-06-28 VITALS — BP 104/68 | HR 81 | Ht 75.0 in | Wt 207.8 lb

## 2019-06-28 DIAGNOSIS — E108 Type 1 diabetes mellitus with unspecified complications: Secondary | ICD-10-CM | POA: Diagnosis not present

## 2019-06-28 LAB — POCT GLYCOSYLATED HEMOGLOBIN (HGB A1C): Hemoglobin A1C: 9.9 % — AB (ref 4.0–5.6)

## 2019-06-28 MED ORDER — NOVOLIN 70/30 RELION (70-30) 100 UNIT/ML ~~LOC~~ SUSP: 70.0000 [IU] | Freq: Every day | SUBCUTANEOUS | 11 refills | Status: DC

## 2019-06-28 NOTE — Progress Notes (Signed)
Subjective:    Patient ID: Christopher [name], male    DOB: [birth year], 73 y.o.   MRN: [id number]  HPI Pt returns for f/u of diabetes mellitus:  DM type: 1 Dx'ed: 2010.   Complications: polyneuropathy and renal insuff.   Therapy: insulin since 2013.   DKA: never.   Severe hypoglycemia: last episode was in 2018.   Pancreatitis: never.  Other: he is back on QD insulin, after noncompliance with multiple daily injections; he could not take lantus, due to AM hypoglycemia and PM hyperglycemia; he uses syringe and vial, after difficulty operating pen.  Interval history: I reviewed continuous glucose monitor data.  Glucose varies from 60-400.  It is in general lowest at lunch.  pt states he feels well in general. Past Medical History:  Diagnosis Date  . Diabetes mellitus type II   . GERD (gastroesophageal reflux disease)   . Glaucoma   . Hyperlipidemia     Past Surgical History:  Procedure Laterality Date  . HERNIA REPAIR    . [place]    Social History   Socioeconomic History  . Marital status: Married    Spouse name: Not on file  . Number of children: Not on file  . Years of education: Not on file  . Highest education level: Not on file  Occupational History  . Not on file  Social Needs  . Financial resource strain: Not on file  . Food insecurity    Worry: Not on file    Inability: Not on file  . Transportation needs    Medical: Not on file    Non-medical: Not on file  Tobacco Use  . Smoking status: Never Smoker  . Smokeless tobacco: Never Used  Substance and Sexual Activity  . Alcohol use: No  . Drug use: No  . Sexual activity: Not on file  Lifestyle  . Physical activity    Days per week: Not on file    Minutes per session: Not on file  . Stress: Not on file  Relationships  . Social Herbalist on phone: Not on file    Gets together: Not on file    Attends religious service: Not on file    Active member of club or organization:  Not on file    Attends meetings of clubs or organizations: Not on file    Relationship status: Not on file  . Intimate partner violence    Fear of current or ex partner: Not on file    Emotionally abused: Not on file    Physically abused: Not on file    Forced sexual activity: Not on file  Other Topics Concern  . Not on file  Social History Narrative  . Not on file    Current Outpatient Medications on File Prior to Visit  Medication Sig Dispense Refill  . Alcohol Swabs (B-D SINGLE USE SWABS REGULAR) PADS USE TO CHECK BLOOD SUGAR 2 TIMES PER DAY 200 each prn  . ALPHAGAN P 0.1 % SOLN Place 1 drop into both eyes daily.     Marland Kitchen aspirin EC 81 MG EC tablet Take 81 mg by mouth daily.      Marland Kitchen atorvastatin (LIPITOR) 80 MG tablet Take 1 tablet (80 mg total) by mouth daily. 90 tablet 3  . Continuous Blood Gluc Receiver (FREESTYLE LIBRE 14 DAY READER) DEVI 1 Device by Other route See admin instructions. 1 Device 0  . Continuous Blood Gluc Sensor (FREESTYLE LIBRE 14  DAY SENSOR) MISC 1 Device by Does not apply route every 14 (fourteen) days. 6 each 3  . dorzolamide-timolol (COSOPT) 22.3-6.8 MG/ML ophthalmic solution Place 1 drop into both eyes 2 (two) times daily.     Marland Kitchen glucagon 1 MG injection Inject 1 mg into the muscle once as needed. (Patient taking differently: Inject 1 mg into the muscle once as needed (for blood sugar). ) 1 each 12  . hydrochlorothiazide (HYDRODIURIL) 25 MG tablet Take 1 tablet (25 mg total) by mouth daily. 90 tablet 4  . Insulin Pen Needle (PEN NEEDLES 5/16") 30G X 8 MM MISC 1 Device by Does not apply route daily. 90 each 3  . lisinopril (PRINIVIL,ZESTRIL) 2.5 MG tablet Take 1 tablet (2.5 mg total) by mouth daily. 90 tablet 4  . Multiple Vitamins-Minerals (CENTRUM SILVER ULTRA MENS PO) Take 1 tablet by mouth daily.     . sildenafil (REVATIO) 20 MG tablet Take 1 to 2 tabs 2 - 3 hours before sex 20 tablet 11  . simvastatin (ZOCOR) 80 MG tablet Take 1 tablet (80 mg total) by mouth  every evening. 90 tablet 4   No current facility-administered medications on file prior to visit.     No Known Allergies  Family History  Problem Relation Age of Onset  . Alcohol abuse Father   . Diabetes Sister     BP 104/68 (BP Location: Left Arm, Patient Position: Sitting, Cuff Size: Normal)   Pulse 81   Ht 6\' 3"  (1.905 m)   Wt 207 lb 12.8 oz (94.3 kg)   SpO2 92%   BMI 25.97 kg/m    Review of Systems Denies LOC    Objective:   Physical Exam VITAL SIGNS:  See vs page GENERAL: no distress Pulses: dorsalis pedis intact bilat.   MSK: no deformity of the feet CV: no leg edema Skin:  no ulcer on the feet.  normal color and temp on the feet. Neuro: sensation is intact to touch on the feet.  Ext: there is bilateral onychomycosis of the toenails, and the left great toe is ingrown.   Lab Results  Component Value Date   HGBA1C 9.9 (A) 06/28/2019   Lab Results  Component Value Date   CREATININE 1.31 08/26/2018   BUN 20 08/26/2018   NA 136 08/26/2018   K 3.7 08/26/2018   CL 101 08/26/2018   CO2 29 08/26/2018        Assessment & Plan:  Type 1 DM: The pattern of his cbg's indicates he needs some adjustment in his therapy. Hypoglycemia: now that we have continuous glucose monitor data, we have learned he needs a slower-acting qd insulin. Renal failure: he still is unlikely to need a slow-acting qd insulin, so we'll adjust slowly.    Patient Instructions  check your blood sugar twice a day.  vary the time of day when you check, between before the 3 meals, and at bedtime.  also check if you have symptoms of your blood sugar being too high or too low.  please keep a record of the readings and bring it to your next appointment here (or you can bring the meter itself).  You can write it on any piece of paper.  please call us sooner if your blood sugar goes below 70, or if you have a lot of readings over 200.   I have sent a prescription to your pharmacy, to change the insulin  to "70/30."  However, if you are going to be active, take  just 50 units that morning--this is important to avoid low blood sugar.  If you are unable to anticipate the activity, eat a light snack with it.  On this type of insulin schedule, you should eat meals on a regular schedule.  If a meal is missed or significantly delayed, your blood sugar could go low.   Please come back for a follow-up appointment in 2 months.

## 2019-06-28 NOTE — Patient Instructions (Addendum)
check your blood sugar twice a day.  vary the time of day when you check, between before the 3 meals, and at bedtime.  also check if you have symptoms of your blood sugar being too high or too low.  please keep a record of the readings and bring it to your next appointment here (or you can bring the meter itself).  You can write it on any piece of paper.  please call us sooner if your blood sugar goes below 70, or if you have a lot of readings over 200.   I have sent a prescription to your pharmacy, to change the insulin to "70/30."  However, if you are going to be active, take just 50 units that morning--this is important to avoid low blood sugar.  If you are unable to anticipate the activity, eat a light snack with it.  On this type of insulin schedule, you should eat meals on a regular schedule.  If a meal is missed or significantly delayed, your blood sugar could go low.   Please come back for a follow-up appointment in 2 months.

## 2019-07-01 ENCOUNTER — Telehealth: Payer: Self-pay | Admitting: Internal Medicine

## 2019-07-01 NOTE — Telephone Encounter (Signed)
Patient is calling back his missed a call. No Message was left. Please advise thank you

## 2019-07-06 ENCOUNTER — Ambulatory Visit (INDEPENDENT_AMBULATORY_CARE_PROVIDER_SITE_OTHER): Payer: Medicare HMO | Admitting: Internal Medicine

## 2019-07-06 ENCOUNTER — Other Ambulatory Visit: Payer: Self-pay

## 2019-07-06 ENCOUNTER — Encounter: Payer: Self-pay | Admitting: Internal Medicine

## 2019-07-06 ENCOUNTER — Ambulatory Visit: Payer: Medicare HMO | Admitting: Internal Medicine

## 2019-07-06 VITALS — BP 110/80 | HR 66 | Temp 98.3°F | Wt 204.8 lb

## 2019-07-06 DIAGNOSIS — E785 Hyperlipidemia, unspecified: Secondary | ICD-10-CM | POA: Diagnosis not present

## 2019-07-06 DIAGNOSIS — H409 Unspecified glaucoma: Secondary | ICD-10-CM | POA: Diagnosis not present

## 2019-07-06 DIAGNOSIS — E108 Type 1 diabetes mellitus with unspecified complications: Secondary | ICD-10-CM | POA: Diagnosis not present

## 2019-07-06 DIAGNOSIS — K219 Gastro-esophageal reflux disease without esophagitis: Secondary | ICD-10-CM

## 2019-07-06 NOTE — Telephone Encounter (Signed)
Patient had appointment 07/06/2019

## 2019-07-06 NOTE — Progress Notes (Signed)
Established Patient Office Visit     CC/Reason for Visit: Follow-up chronic conditions  HPI: Christopher [name] is a 73 y.o. male who is coming in today for the above mentioned reasons. Past Medical History is significant for: Well-controlled hypertension, glaucoma, labile insulin-dependent diabetes followed by endocrinology, Dr. Loanne Drilling.  Hyperlipidemia that has not been well controlled.  He has no acute complaints today.  He has been homebound due to the COVID-19 pandemic.   Past Medical/Surgical History: Past Medical History:  Diagnosis Date  . Diabetes mellitus type II   . GERD (gastroesophageal reflux disease)   . Glaucoma   . Hyperlipidemia     Past Surgical History:  Procedure Laterality Date  . HERNIA REPAIR    . [place]    Social History:  reports that he has never smoked. He has never used smokeless tobacco. He reports that he does not drink alcohol or use drugs.  Allergies: No Known Allergies  Family History:  Family History  Problem Relation Age of Onset  . Alcohol abuse Father   . Diabetes Sister      Current Outpatient Medications:  .  Alcohol Swabs (B-D SINGLE USE SWABS REGULAR) PADS, USE TO CHECK BLOOD SUGAR 2 TIMES PER DAY, Disp: 200 each, Rfl: prn .  ALPHAGAN P 0.1 % SOLN, Place 1 drop into both eyes daily. , Disp: , Rfl:  .  aspirin EC 81 MG EC tablet, Take 81 mg by mouth daily.  , Disp: , Rfl:  .  atorvastatin (LIPITOR) 80 MG tablet, Take 1 tablet (80 mg total) by mouth daily., Disp: 90 tablet, Rfl: 3 .  Continuous Blood Gluc Receiver (FREESTYLE LIBRE 14 DAY READER) DEVI, 1 Device by Other route See admin instructions., Disp: 1 Device, Rfl: 0 .  Continuous Blood Gluc Sensor (FREESTYLE LIBRE 14 DAY SENSOR) MISC, 1 Device by Does not apply route every 14 (fourteen) days., Disp: 6 each, Rfl: 3 .  dorzolamide-timolol (COSOPT) 22.3-6.8 MG/ML ophthalmic solution, Place 1 drop into both eyes 2 (two) times daily. , Disp: , Rfl:  .   glucagon 1 MG injection, Inject 1 mg into the muscle once as needed. (Patient taking differently: Inject 1 mg into the muscle once as needed (for blood sugar). ), Disp: 1 each, Rfl: 12 .  hydrochlorothiazide (HYDRODIURIL) 25 MG tablet, Take 1 tablet (25 mg total) by mouth daily., Disp: 90 tablet, Rfl: 4 .  insulin NPH-regular Human (NOVOLIN 70/30 RELION) (70-30) 100 UNIT/ML injection, Inject 70 Units into the skin daily with breakfast., Disp: 30 mL, Rfl: 11 .  Insulin Pen Needle (PEN NEEDLES 5/16") 30G X 8 MM MISC, 1 Device by Does not apply route daily., Disp: 90 each, Rfl: 3 .  lisinopril (PRINIVIL,ZESTRIL) 2.5 MG tablet, Take 1 tablet (2.5 mg total) by mouth daily., Disp: 90 tablet, Rfl: 4 .  Multiple Vitamins-Minerals (CENTRUM SILVER ULTRA MENS PO), Take 1 tablet by mouth daily. , Disp: , Rfl:  .  sildenafil (REVATIO) 20 MG tablet, Take 1 to 2 tabs 2 - 3 hours before sex, Disp: 20 tablet, Rfl: 11 .  simvastatin (ZOCOR) 80 MG tablet, Take 1 tablet (80 mg total) by mouth every evening., Disp: 90 tablet, Rfl: 4  Review of Systems:  Constitutional: Denies fever, chills, diaphoresis, appetite change and fatigue.  HEENT: Denies photophobia, eye pain, redness, hearing loss, ear pain, congestion, sore throat, rhinorrhea, sneezing, mouth sores, trouble swallowing, neck pain, neck stiffness and tinnitus.   Respiratory: Denies  SOB, DOE, cough, chest tightness,  and wheezing.   Cardiovascular: Denies chest pain, palpitations and leg swelling.  Gastrointestinal: Denies nausea, vomiting, abdominal pain, diarrhea, constipation, blood in stool and abdominal distention.  Genitourinary: Denies dysuria, urgency, frequency, hematuria, flank pain and difficulty urinating.  Endocrine: Denies: hot or cold intolerance, sweats, changes in hair or nails, polyuria, polydipsia. Musculoskeletal: Denies myalgias, back pain, joint swelling, arthralgias and gait problem.  Skin: Denies pallor, rash and wound.  Neurological:  Denies dizziness, seizures, syncope, weakness, light-headedness, numbness and headaches.  Hematological: Denies adenopathy. Easy bruising, personal or family bleeding history  Psychiatric/Behavioral: Denies suicidal ideation, mood changes, confusion, nervousness, sleep disturbance and agitation    Physical Exam: Vitals:   07/06/19 0745  BP: 110/80  Pulse: 66  Temp: 98.3 F (36.8 C)  TempSrc: Temporal  SpO2: 97%  Weight: 204 lb 12.8 oz (92.9 kg)    Body mass index is 25.6 kg/m.   Constitutional: NAD, calm, comfortable Eyes: PERRL, lids and conjunctivae normal ENMT: Mucous membranes are moist. Posterior pharynx clear of any exudate or lesions. Normal dentition. Tympanic membrane is pearly white, no erythema or bulging. Neck: normal, supple, no masses, no thyromegaly Respiratory: clear to auscultation bilaterally, no wheezing, no crackles. Normal respiratory effort. No accessory muscle use.  Cardiovascular: Regular rate and rhythm, no murmurs / rubs / gallops. No extremity edema. 2+ pedal pulses. No carotid bruits.  Abdomen: no tenderness, no masses palpated. No hepatosplenomegaly. Bowel sounds positive.  Musculoskeletal: no clubbing / cyanosis. No joint deformity upper and lower extremities. Good ROM, no contractures. Normal muscle tone.  Skin: no rashes, lesions, ulcers. No induration Neurologic: CN 2-12 grossly intact. Sensation intact, DTR normal. Strength 5/5 in all 4.  Psychiatric: Normal judgment and insight. Alert and oriented x 3. Normal mood.    Impression and Plan:  Hyperlipidemia, unspecified hyperlipidemia type  -Last LDL was 159 in September 2019. -Goal LDL is less than 70 due to history of diabetes. -Check LDL when he returns for physical and consider starting statin.  Glaucoma, unspecified glaucoma type, unspecified laterality  -Follows routinely with Dr. Allen Kell.  Type 1 diabetes mellitus with unspecified complications (HCC)  -Follows with  endocrinology, Dr. Loanne Drilling. -He was just started on 7030 in the morning only, suspect he may need twice daily dosing as his sugars are above 400 in the morning.  Have advised to contact Dr. Cordelia Pen office for further advice.  Gastroesophageal reflux disease without esophagitis  -Well-controlled off medications.   Patient Instructions  -Nice seeing you today!  -Contact Dr. Loanne Drilling to discuss your high sugars in the morning.  -Schedule follow up in 3-4 months for your physical.     Lelon Frohlich, MD [facility]

## 2019-07-06 NOTE — Patient Instructions (Signed)
-  Nice seeing you today!  -Contact Dr. Loanne Drilling to discuss your high sugars in the morning.  -Schedule follow up in 3-4 months for your physical.

## 2019-07-09 ENCOUNTER — Other Ambulatory Visit: Payer: Self-pay

## 2019-07-09 ENCOUNTER — Ambulatory Visit (INDEPENDENT_AMBULATORY_CARE_PROVIDER_SITE_OTHER): Payer: Medicare HMO | Admitting: Endocrinology

## 2019-07-09 ENCOUNTER — Encounter: Payer: Self-pay | Admitting: Endocrinology

## 2019-07-09 ENCOUNTER — Telehealth: Payer: Self-pay | Admitting: Endocrinology

## 2019-07-09 VITALS — BP 108/70 | HR 70 | Ht 75.0 in | Wt 206.4 lb

## 2019-07-09 DIAGNOSIS — E108 Type 1 diabetes mellitus with unspecified complications: Secondary | ICD-10-CM

## 2019-07-09 DIAGNOSIS — E1029 Type 1 diabetes mellitus with other diabetic kidney complication: Secondary | ICD-10-CM

## 2019-07-09 MED ORDER — INSULIN NPH (HUMAN) (ISOPHANE) 100 UNIT/ML ~~LOC~~ SUSP: 70.0000 [IU] | SUBCUTANEOUS | 11 refills | Status: DC

## 2019-07-09 NOTE — Telephone Encounter (Signed)
Called pt to obtain last 3 days worth of CBG's. LVM requesting returned call.

## 2019-07-09 NOTE — Telephone Encounter (Signed)
Pt walked in and was placed on schedule to be seen by Dr. Loanne Drilling today.

## 2019-07-09 NOTE — Patient Instructions (Addendum)
check your blood sugar twice a day.  vary the time of day when you check, between before the 3 meals, and at bedtime.  also check if you have symptoms of your blood sugar being too high or too low.  please keep a record of the readings and bring it to your next appointment here (or you can bring the meter itself).  You can write it on any piece of paper.  please call us sooner if your blood sugar goes below 70, or if you have a lot of readings over 200.   I have sent a prescription to your pharmacy, to change the insulin to "N"  However, if you are going to be active, take just 50 units that morning--this is important to avoid low blood sugar.  If you are unable to anticipate the activity, eat a light snack with it.   On this type of insulin schedule, you should eat meals on a regular schedule.  If a meal is missed or significantly delayed, your blood sugar could go low.   Please come back for a follow-up appointment in 1 month.

## 2019-07-09 NOTE — Telephone Encounter (Signed)
Please review and advise.

## 2019-07-09 NOTE — Telephone Encounter (Signed)
I need to know the range of glucose, and what time of day it is higher vs lower.

## 2019-07-09 NOTE — Progress Notes (Signed)
Subjective:    Patient ID: Christopher [name], male    DOB: 1946-04-25, 73 y.o.   MRN: [id number]  HPI Pt returns for f/u of diabetes mellitus:  DM type: 1 Dx'ed: 2010.   Complications: polyneuropathy and renal insuff.   Therapy: insulin since 2013.   DKA: never.   Severe hypoglycemia: last episode was in 2018.   Pancreatitis: never.  Other: he is back on QD insulin, after noncompliance with multiple daily injections; he could not take lantus, due to AM hypoglycemia and PM hyperglycemia; he uses syringe and vial, after difficulty operating pen.  Interval history: Pt says glucose is lowest with playing golf, usually at lunch.  However, pt says he eats breakfast at 7 AM and lunch at noon, whether he plays golf or not.  I reviewed continuous glucose monitor data.  Glucose varies from 50-400.  It is in general lowest 9 AM-2 PM.   Past Medical History:  Diagnosis Date  . Diabetes mellitus type II   . GERD (gastroesophageal reflux disease)   . Glaucoma   . Hyperlipidemia     Past Surgical History:  Procedure Laterality Date  . HERNIA REPAIR    . [place]    Social History   Socioeconomic History  . Marital status: Married    Spouse name: Not on file  . Number of children: Not on file  . Years of education: Not on file  . Highest education level: Not on file  Occupational History  . Not on file  Social Needs  . Financial resource strain: Not on file  . Food insecurity    Worry: Not on file    Inability: Not on file  . Transportation needs    Medical: Not on file    Non-medical: Not on file  Tobacco Use  . Smoking status: Never Smoker  . Smokeless tobacco: Never Used  Substance and Sexual Activity  . Alcohol use: No  . Drug use: No  . Sexual activity: Not on file  Lifestyle  . Physical activity    Days per week: Not on file    Minutes per session: Not on file  . Stress: Not on file  Relationships  . Social Herbalist on phone: Not on  file    Gets together: Not on file    Attends religious service: Not on file    Active member of club or organization: Not on file    Attends meetings of clubs or organizations: Not on file    Relationship status: Not on file  . Intimate partner violence    Fear of current or ex partner: Not on file    Emotionally abused: Not on file    Physically abused: Not on file    Forced sexual activity: Not on file  Other Topics Concern  . Not on file  Social History Narrative  . Not on file    Current Outpatient Medications on File Prior to Visit  Medication Sig Dispense Refill  . Alcohol Swabs (B-D SINGLE USE SWABS REGULAR) PADS USE TO CHECK BLOOD SUGAR 2 TIMES PER DAY 200 each prn  . ALPHAGAN P 0.1 % SOLN Place 1 drop into both eyes daily.     Marland Kitchen aspirin EC 81 MG EC tablet Take 81 mg by mouth daily.      Marland Kitchen atorvastatin (LIPITOR) 80 MG tablet Take 1 tablet (80 mg total) by mouth daily. 90 tablet 3  . Continuous Blood Gluc  Receiver (FREESTYLE LIBRE 14 DAY READER) DEVI 1 Device by Other route See admin instructions. 1 Device 0  . Continuous Blood Gluc Sensor (FREESTYLE LIBRE 14 DAY SENSOR) MISC 1 Device by Does not apply route every 14 (fourteen) days. 6 each 3  . dorzolamide-timolol (COSOPT) 22.3-6.8 MG/ML ophthalmic solution Place 1 drop into both eyes 2 (two) times daily.     Marland Kitchen glucagon 1 MG injection Inject 1 mg into the muscle once as needed. (Patient taking differently: Inject 1 mg into the muscle once as needed (for blood sugar). ) 1 each 12  . hydrochlorothiazide (HYDRODIURIL) 25 MG tablet Take 1 tablet (25 mg total) by mouth daily. 90 tablet 4  . Insulin Pen Needle (PEN NEEDLES 5/16") 30G X 8 MM MISC 1 Device by Does not apply route daily. 90 each 3  . lisinopril (PRINIVIL,ZESTRIL) 2.5 MG tablet Take 1 tablet (2.5 mg total) by mouth daily. 90 tablet 4  . Multiple Vitamins-Minerals (CENTRUM SILVER ULTRA MENS PO) Take 1 tablet by mouth daily.     . sildenafil (REVATIO) 20 MG tablet Take 1  to 2 tabs 2 - 3 hours before sex 20 tablet 11  . simvastatin (ZOCOR) 80 MG tablet Take 1 tablet (80 mg total) by mouth every evening. 90 tablet 4   No current facility-administered medications on file prior to visit.     No Known Allergies  Family History  Problem Relation Age of Onset  . Alcohol abuse Father   . Diabetes Sister     BP 108/70 (BP Location: Left Arm, Patient Position: Sitting, Cuff Size: Normal)   Pulse 70   Ht 6\' 3"  (1.905 m)   Wt 206 lb 6.4 oz (93.6 kg)   SpO2 95%   BMI 25.80 kg/m    Review of Systems Denies LOC    Objective:   Physical Exam VITAL SIGNS:  See vs page GENERAL: no distress Pulses: dorsalis pedis intact bilat.   MSK: no deformity of the feet CV: no leg edema.   Skin:  no ulcer on the feet.  normal color and temp on the feet.   Neuro: sensation is intact to touch on the feet.    Lab Results  Component Value Date   HGBA1C 9.9 (A) 06/28/2019   Lab Results  Component Value Date   CREATININE 1.31 08/26/2018   BUN 20 08/26/2018   NA 136 08/26/2018   K 3.7 08/26/2018   CL 101 08/26/2018   CO2 29 08/26/2018       Assessment & Plan:  Type 1 DM: the pattern of his cbg's indicates he needs a slower-acting qam insulin. Hypoglycemia: this limits aggressiveness of glycemic control.   Renal insuff: this increases the duration of action of insulin.   Patient Instructions  check your blood sugar twice a day.  vary the time of day when you check, between before the 3 meals, and at bedtime.  also check if you have symptoms of your blood sugar being too high or too low.  please keep a record of the readings and bring it to your next appointment here (or you can bring the meter itself).  You can write it on any piece of paper.  please call us sooner if your blood sugar goes below 70, or if you have a lot of readings over 200.   I have sent a prescription to your pharmacy, to change the insulin to "N"  However, if you are going to be active, take  just 50  units that morning--this is important to avoid low blood sugar.  If you are unable to anticipate the activity, eat a light snack with it.   On this type of insulin schedule, you should eat meals on a regular schedule.  If a meal is missed or significantly delayed, your blood sugar could go low.   Please come back for a follow-up appointment in 1 month.

## 2019-07-09 NOTE — Telephone Encounter (Signed)
Pt was seen by Dr Jerilee Hoh PCP on 07/06/19 and was recommended to call Dr Loanne Drilling for advice regarding elevated BS readings. Pt having elevated BS readings above 400 in the mornings and she feels that he needs something twice daily for sugar control. Currently on Novolin 70/30, 70 units QAM before checking fasting CBG.   [place] [place] [facility] [place]  Per PCP notes 07/06/19: Type 1 diabetes mellitus with unspecified complications ([place])  -Follows with endocrinology, Dr. Loanne Drilling. -He was just started on 7030 in the morning only, suspect he may need twice daily dosing as his sugars are above 400 in the morning.  Have advised to contact Dr. Cordelia Pen office for further advice.

## 2019-07-12 ENCOUNTER — Telehealth: Payer: Self-pay | Admitting: Endocrinology

## 2019-07-12 NOTE — Telephone Encounter (Signed)
Patient states Dr.Ellison changed him to Novolin N and states his sugars have not been under 400. He took the insulin he was on before this morning and his numbers are decreasing.  Please Advise, Thanks

## 2019-07-12 NOTE — Telephone Encounter (Signed)
Pt aware of recommendations. Will call back if BS do not improve.

## 2019-07-12 NOTE — Telephone Encounter (Signed)
Please make sure the insulin is well suspended before drawing up, and that it does not leak out.  Also, please increase to 80 units qam.

## 2019-07-22 ENCOUNTER — Telehealth: Payer: Self-pay | Admitting: Internal Medicine

## 2019-07-22 NOTE — Telephone Encounter (Signed)
Why does he need a note stating he should exercise?

## 2019-07-22 NOTE — Telephone Encounter (Signed)
Relation to pt: self  Call back number: [phone]   Reason for call:  [facility]  Due to [place], patient states now that he's ready to work out again at [place], [place], [place] [address] [phone] there requesting a Dr. Note stating patient should be exercising, please advise

## 2019-07-23 NOTE — Telephone Encounter (Signed)
Ok to write a generic note stating he would benefit from exercise due to his chronic conditions.Marland KitchenMarland Kitchen

## 2019-07-23 NOTE — Telephone Encounter (Signed)
Patient is not allowed to exercise at the [facility] without a note/prescription stating the patient will benefit from exercise.

## 2019-07-23 NOTE — Telephone Encounter (Signed)
Letter ready for pick up and patient is aware. 

## 2019-07-27 ENCOUNTER — Other Ambulatory Visit: Payer: Self-pay | Admitting: Endocrinology

## 2019-07-30 ENCOUNTER — Telehealth: Payer: Self-pay | Admitting: Endocrinology

## 2019-07-30 NOTE — Telephone Encounter (Signed)
Patient states he is returning Ammie's call. Please call patient at ph# [phone]

## 2019-08-02 NOTE — Telephone Encounter (Signed)
No call was placed to this pt. At the time pt called I was not working on this day nor able to locate any documentation to support why another staff member would have called. Encounter closed d/t lack of supporting information for call

## 2019-08-04 ENCOUNTER — Other Ambulatory Visit: Payer: Self-pay

## 2019-08-06 ENCOUNTER — Encounter: Payer: Self-pay | Admitting: Endocrinology

## 2019-08-06 ENCOUNTER — Other Ambulatory Visit: Payer: Self-pay

## 2019-08-06 ENCOUNTER — Ambulatory Visit (INDEPENDENT_AMBULATORY_CARE_PROVIDER_SITE_OTHER): Payer: Medicare HMO | Admitting: Endocrinology

## 2019-08-06 VITALS — BP 104/64 | HR 73 | Ht 75.0 in | Wt 211.4 lb

## 2019-08-06 DIAGNOSIS — E10649 Type 1 diabetes mellitus with hypoglycemia without coma: Secondary | ICD-10-CM | POA: Diagnosis not present

## 2019-08-06 DIAGNOSIS — E1029 Type 1 diabetes mellitus with other diabetic kidney complication: Secondary | ICD-10-CM

## 2019-08-06 DIAGNOSIS — E108 Type 1 diabetes mellitus with unspecified complications: Secondary | ICD-10-CM

## 2019-08-06 MED ORDER — INSULIN REGULAR HUMAN 100 UNIT/ML IJ SOLN: 5.0000 [IU] | Freq: Every day | INTRAMUSCULAR | 11 refills | Status: DC

## 2019-08-06 MED ORDER — INSULIN NPH (HUMAN) (ISOPHANE) 100 UNIT/ML ~~LOC~~ SUSP: 75.0000 [IU] | SUBCUTANEOUS | 11 refills | Status: DC

## 2019-08-06 NOTE — Patient Instructions (Addendum)
check your blood sugar twice a day.  vary the time of day when you check, between before the 3 meals, and at bedtime.  also check if you have symptoms of your blood sugar being too high or too low.  please keep a record of the readings and bring it to your next appointment here (or you can bring the meter itself).  You can write it on any piece of paper.  please call us sooner if your blood sugar goes below 70, or if you have a lot of readings over 200.   Please reduce the NPH to 75 units each morning However, if you are going to be active, take just 50 units that morning--this is important to avoid low blood sugar.  If you are unable to anticipate the activity, eat a light snack with it.   Also, I have sent a prescription to your pharmacy, to add "R," 5 units with breakfast. On this type of insulin schedule, you should eat meals on a regular schedule.  If a meal is missed or significantly delayed, your blood sugar could go low.   Please come back for a follow-up appointment in 1 month.

## 2019-08-06 NOTE — Progress Notes (Signed)
Subjective:    Patient ID: Christopher [name], male    DOB: [birth year], 73 y.o.   MRN: [id number]  HPI Pt returns for f/u of diabetes mellitus:  DM type: 1 Dx'ed: 2010.   Complications: polyneuropathy and renal insuff.   Therapy: insulin since 2013.   DKA: never.   Severe hypoglycemia: last episode was in 2018.   Pancreatitis: never.  Other: he is back on QD insulin, after noncompliance with multiple daily injections; he could not take lantus, due to AM hypoglycemia and PM hyperglycemia; he uses syringe and vial, after difficulty operating pen.  Interval history: I reviewed continuous glucose monitor data.  Glucose varies from 50-400.  It is in general lowest fasting, and highest at lunch.  He takes NPH, 80 units qam.  pt states he feels well in general.  He has not been reducing the NPH for activity.  Past Medical History:  Diagnosis Date  . Diabetes mellitus type II   . GERD (gastroesophageal reflux disease)   . Glaucoma   . Hyperlipidemia     Past Surgical History:  Procedure Laterality Date  . HERNIA REPAIR    . [place]    Social History   Socioeconomic History  . Marital status: Married    Spouse name: Not on file  . Number of children: Not on file  . Years of education: Not on file  . Highest education level: Not on file  Occupational History  . Not on file  Social Needs  . Financial resource strain: Not on file  . Food insecurity    Worry: Not on file    Inability: Not on file  . Transportation needs    Medical: Not on file    Non-medical: Not on file  Tobacco Use  . Smoking status: Never Smoker  . Smokeless tobacco: Never Used  Substance and Sexual Activity  . Alcohol use: No  . Drug use: No  . Sexual activity: Not on file  Lifestyle  . Physical activity    Days per week: Not on file    Minutes per session: Not on file  . Stress: Not on file  Relationships  . Social Herbalist on phone: Not on file    Gets together:  Not on file    Attends religious service: Not on file    Active member of club or organization: Not on file    Attends meetings of clubs or organizations: Not on file    Relationship status: Not on file  . Intimate partner violence    Fear of current or ex partner: Not on file    Emotionally abused: Not on file    Physically abused: Not on file    Forced sexual activity: Not on file  Other Topics Concern  . Not on file  Social History Narrative  . Not on file    Current Outpatient Medications on File Prior to Visit  Medication Sig Dispense Refill  . Alcohol Swabs (B-D SINGLE USE SWABS REGULAR) PADS USE TO CHECK BLOOD SUGAR 2 TIMES PER DAY 200 each prn  . ALPHAGAN P 0.1 % SOLN Place 1 drop into both eyes daily.     Marland Kitchen aspirin EC 81 MG EC tablet Take 81 mg by mouth daily.      Marland Kitchen atorvastatin (LIPITOR) 80 MG tablet Take 1 tablet (80 mg total) by mouth daily. 90 tablet 3  . Continuous Blood Gluc Receiver (FREESTYLE LIBRE 14 DAY READER)  DEVI 1 Device by Other route See admin instructions. 1 Device 0  . Continuous Blood Gluc Sensor (FREESTYLE LIBRE 14 DAY SENSOR) MISC CHANGE EVERY 14 DAYS 6 each 0  . dorzolamide-timolol (COSOPT) 22.3-6.8 MG/ML ophthalmic solution Place 1 drop into both eyes 2 (two) times daily.     Marland Kitchen glucagon 1 MG injection Inject 1 mg into the muscle once as needed. (Patient taking differently: Inject 1 mg into the muscle once as needed (for blood sugar). ) 1 each 12  . hydrochlorothiazide (HYDRODIURIL) 25 MG tablet Take 1 tablet (25 mg total) by mouth daily. 90 tablet 4  . Insulin Pen Needle (PEN NEEDLES 5/16") 30G X 8 MM MISC 1 Device by Does not apply route daily. 90 each 3  . lisinopril (PRINIVIL,ZESTRIL) 2.5 MG tablet Take 1 tablet (2.5 mg total) by mouth daily. 90 tablet 4  . Multiple Vitamins-Minerals (CENTRUM SILVER ULTRA MENS PO) Take 1 tablet by mouth daily.     . sildenafil (REVATIO) 20 MG tablet Take 1 to 2 tabs 2 - 3 hours before sex 20 tablet 11  . simvastatin  (ZOCOR) 80 MG tablet Take 1 tablet (80 mg total) by mouth every evening. 90 tablet 4   No current facility-administered medications on file prior to visit.     No Known Allergies  Family History  Problem Relation Age of Onset  . Alcohol abuse Father   . Diabetes Sister     BP 104/64 (BP Location: Left Arm, Patient Position: Sitting, Cuff Size: Normal)   Pulse 73   Ht 6\' 3"  (1.905 m)   Wt 211 lb 6.4 oz (95.9 kg)   SpO2 97%   BMI 26.42 kg/m    Review of Systems Denies LOC.      Objective:   Physical Exam VITAL SIGNS:  See vs page GENERAL: no distress Pulses: dorsalis pedis intact bilat.   MSK: no deformity of the feet CV: no leg edema Skin:  no ulcer on the feet.  normal color and temp on the feet. Neuro: sensation is intact to touch on the feet  Lab Results  Component Value Date   CREATININE 1.31 08/26/2018   BUN 20 08/26/2018   NA 136 08/26/2018   K 3.7 08/26/2018   CL 101 08/26/2018   CO2 29 08/26/2018    Lab Results  Component Value Date   HGBA1C 9.9 (A) 06/28/2019       Assessment & Plan:  Type 1 DM; with renal insuff: he needs increased rx Hypoglycemia: this limits aggressiveness of glycemic control Renal insuff: this may explain why he has fasting hypoglycemia.   Patient Instructions  check your blood sugar twice a day.  vary the time of day when you check, between before the 3 meals, and at bedtime.  also check if you have symptoms of your blood sugar being too high or too low.  please keep a record of the readings and bring it to your next appointment here (or you can bring the meter itself).  You can write it on any piece of paper.  please call us sooner if your blood sugar goes below 70, or if you have a lot of readings over 200.   Please reduce the NPH to 75 units each morning However, if you are going to be active, take just 50 units that morning--this is important to avoid low blood sugar.  If you are unable to anticipate the activity, eat a  light snack with it.   Also, I  have sent a prescription to your pharmacy, to add "R," 5 units with breakfast. On this type of insulin schedule, you should eat meals on a regular schedule.  If a meal is missed or significantly delayed, your blood sugar could go low.   Please come back for a follow-up appointment in 1 month.

## 2019-08-30 ENCOUNTER — Ambulatory Visit: Payer: Medicare HMO | Admitting: Endocrinology

## 2019-09-06 ENCOUNTER — Other Ambulatory Visit: Payer: Self-pay

## 2019-09-08 ENCOUNTER — Other Ambulatory Visit: Payer: Self-pay

## 2019-09-08 ENCOUNTER — Encounter: Payer: Self-pay | Admitting: Endocrinology

## 2019-09-08 ENCOUNTER — Ambulatory Visit (INDEPENDENT_AMBULATORY_CARE_PROVIDER_SITE_OTHER): Payer: Medicare HMO | Admitting: Endocrinology

## 2019-09-08 VITALS — BP 110/80 | HR 70 | Ht 75.0 in | Wt 212.4 lb

## 2019-09-08 DIAGNOSIS — E1042 Type 1 diabetes mellitus with diabetic polyneuropathy: Secondary | ICD-10-CM

## 2019-09-08 DIAGNOSIS — E108 Type 1 diabetes mellitus with unspecified complications: Secondary | ICD-10-CM

## 2019-09-08 DIAGNOSIS — E1029 Type 1 diabetes mellitus with other diabetic kidney complication: Secondary | ICD-10-CM | POA: Diagnosis not present

## 2019-09-08 LAB — POCT GLYCOSYLATED HEMOGLOBIN (HGB A1C): Hemoglobin A1C: 9 % — AB (ref 4.0–5.6)

## 2019-09-08 MED ORDER — LANTUS SOLOSTAR 100 UNIT/ML ~~LOC~~ SOPN: 80.0000 [IU] | PEN_INJECTOR | SUBCUTANEOUS | 99 refills | Status: DC

## 2019-09-08 NOTE — Patient Instructions (Addendum)
check your blood sugar twice a day.  vary the time of day when you check, between before the 3 meals, and at bedtime.  also check if you have symptoms of your blood sugar being too high or too low.  please keep a record of the readings and bring it to your next appointment here (or you can bring the meter itself).  You can write it on any piece of paper.  please call us sooner if your blood sugar goes below 70, or if you have a lot of readings over 200.   I have sent a prescription to your pharmacy, to change back to Lantus, 80 units each morning.   On days you are going to be active, take just 60 units.  To avoid the blood sugar going low in the middles of the night, eat a light snack at bedtime.   On this type of insulin schedule, you should eat meals on a regular schedule.  If a meal is missed or significantly delayed, your blood sugar could go low.   Please come back for a follow-up appointment in 1 month.

## 2019-09-08 NOTE — Progress Notes (Signed)
Subjective:    Patient ID: Christopher [name], male    DOB: [birth year], 73 y.o.   MRN: [id number]  HPI Pt returns for f/u of diabetes mellitus:  DM type: 1 Dx'ed: 2010.   Complications: polyneuropathy and renal insuff.   Therapy: insulin since 2013.   DKA: never.   Severe hypoglycemia: last episode was in 2018.   Pancreatitis: never.  Other: he is back on QD insulin, after noncompliance with multiple daily injections; he could not take lantus, due to AM hypoglycemia and PM hyperglycemia; he uses syringe and vial, after difficulty operating pen.  Interval history: I reviewed continuous glucose monitor data.  Glucose varies from 50-400.  It is widely variable throughout the day.   pt states he feels well in general.   Past Medical History:  Diagnosis Date  . Diabetes mellitus type II   . GERD (gastroesophageal reflux disease)   . Glaucoma   . Hyperlipidemia     Past Surgical History:  Procedure Laterality Date  . HERNIA REPAIR    . [place]    Social History   Socioeconomic History  . Marital status: Married    Spouse name: Not on file  . Number of children: Not on file  . Years of education: Not on file  . Highest education level: Not on file  Occupational History  . Not on file  Social Needs  . Financial resource strain: Not on file  . Food insecurity    Worry: Not on file    Inability: Not on file  . Transportation needs    Medical: Not on file    Non-medical: Not on file  Tobacco Use  . Smoking status: Never Smoker  . Smokeless tobacco: Never Used  Substance and Sexual Activity  . Alcohol use: No  . Drug use: No  . Sexual activity: Not on file  Lifestyle  . Physical activity    Days per week: Not on file    Minutes per session: Not on file  . Stress: Not on file  Relationships  . Social Herbalist on phone: Not on file    Gets together: Not on file    Attends religious service: Not on file    Active member of club or  organization: Not on file    Attends meetings of clubs or organizations: Not on file    Relationship status: Not on file  . Intimate partner violence    Fear of current or ex partner: Not on file    Emotionally abused: Not on file    Physically abused: Not on file    Forced sexual activity: Not on file  Other Topics Concern  . Not on file  Social History Narrative  . Not on file    Current Outpatient Medications on File Prior to Visit  Medication Sig Dispense Refill  . Alcohol Swabs (B-D SINGLE USE SWABS REGULAR) PADS USE TO CHECK BLOOD SUGAR 2 TIMES PER DAY 200 each prn  . ALPHAGAN P 0.1 % SOLN Place 1 drop into both eyes daily.     Marland Kitchen aspirin EC 81 MG EC tablet Take 81 mg by mouth daily.      Marland Kitchen atorvastatin (LIPITOR) 80 MG tablet Take 1 tablet (80 mg total) by mouth daily. 90 tablet 3  . Continuous Blood Gluc Receiver (FREESTYLE LIBRE 14 DAY READER) DEVI 1 Device by Other route See admin instructions. 1 Device 0  . Continuous Blood Gluc Sensor (  FREESTYLE LIBRE 14 DAY SENSOR) MISC CHANGE EVERY 14 DAYS 6 each 0  . dorzolamide-timolol (COSOPT) 22.3-6.8 MG/ML ophthalmic solution Place 1 drop into both eyes 2 (two) times daily.     Marland Kitchen glucagon 1 MG injection Inject 1 mg into the muscle once as needed. (Patient taking differently: Inject 1 mg into the muscle once as needed (for blood sugar). ) 1 each 12  . hydrochlorothiazide (HYDRODIURIL) 25 MG tablet Take 1 tablet (25 mg total) by mouth daily. 90 tablet 4  . Insulin Pen Needle (PEN NEEDLES 5/16") 30G X 8 MM MISC 1 Device by Does not apply route daily. 90 each 3  . lisinopril (PRINIVIL,ZESTRIL) 2.5 MG tablet Take 1 tablet (2.5 mg total) by mouth daily. 90 tablet 4  . Multiple Vitamins-Minerals (CENTRUM SILVER ULTRA MENS PO) Take 1 tablet by mouth daily.     . sildenafil (REVATIO) 20 MG tablet Take 1 to 2 tabs 2 - 3 hours before sex 20 tablet 11  . simvastatin (ZOCOR) 80 MG tablet Take 1 tablet (80 mg total) by mouth every evening. 90 tablet 4    No current facility-administered medications on file prior to visit.     No Known Allergies  Family History  Problem Relation Age of Onset  . Alcohol abuse Father   . Diabetes Sister     BP 110/80 (BP Location: Left Arm, Patient Position: Sitting, Cuff Size: Normal)   Pulse 70   Ht 6\' 3"  (1.905 m)   Wt 212 lb 6.4 oz (96.3 kg)   SpO2 98%   BMI 26.55 kg/m    Review of Systems Denies LOC    Objective:   Physical Exam VITAL SIGNS:  See vs page GENERAL: no distress Pulses: dorsalis pedis intact bilat.   MSK: no deformity of the feet CV: no leg edema Skin:  no ulcer on the feet.  normal color and temp on the feet. Neuro: sensation is intact to touch on the feet.   Lab Results  Component Value Date   HGBA1C 9.0 (A) 09/08/2019   Lab Results  Component Value Date   CREATININE 1.31 08/26/2018   BUN 20 08/26/2018   NA 136 08/26/2018   K 3.7 08/26/2018   CL 101 08/26/2018   CO2 29 08/26/2018       Assessment & Plan:  Type 1 DM, with PN: he needs increased rx Renal insuff: he is at risk for nocturnal hypoglycemia, so he should eat a snack at HS.   Hypoglycemia: this limits aggressiveness of glycemic control.   Patient Instructions  check your blood sugar twice a day.  vary the time of day when you check, between before the 3 meals, and at bedtime.  also check if you have symptoms of your blood sugar being too high or too low.  please keep a record of the readings and bring it to your next appointment here (or you can bring the meter itself).  You can write it on any piece of paper.  please call us sooner if your blood sugar goes below 70, or if you have a lot of readings over 200.   I have sent a prescription to your pharmacy, to change back to Lantus, 80 units each morning.   On days you are going to be active, take just 60 units.  To avoid the blood sugar going low in the middles of the night, eat a light snack at bedtime.   On this type of insulin schedule, you  should  eat meals on a regular schedule.  If a meal is missed or significantly delayed, your blood sugar could go low.   Please come back for a follow-up appointment in 1 month.

## 2019-09-14 DIAGNOSIS — H401131 Primary open-angle glaucoma, bilateral, mild stage: Secondary | ICD-10-CM | POA: Diagnosis not present

## 2019-09-14 DIAGNOSIS — H5201 Hypermetropia, right eye: Secondary | ICD-10-CM | POA: Diagnosis not present

## 2019-09-14 DIAGNOSIS — E119 Type 2 diabetes mellitus without complications: Secondary | ICD-10-CM | POA: Diagnosis not present

## 2019-09-14 LAB — HM DIABETES EYE EXAM

## 2019-10-06 ENCOUNTER — Other Ambulatory Visit: Payer: Self-pay

## 2019-10-06 ENCOUNTER — Ambulatory Visit (INDEPENDENT_AMBULATORY_CARE_PROVIDER_SITE_OTHER): Payer: Medicare HMO | Admitting: Internal Medicine

## 2019-10-06 ENCOUNTER — Encounter: Payer: Self-pay | Admitting: Gastroenterology

## 2019-10-06 ENCOUNTER — Encounter: Payer: Self-pay | Admitting: Internal Medicine

## 2019-10-06 VITALS — BP 120/80 | HR 72 | Temp 97.3°F | Ht 75.0 in | Wt 213.3 lb

## 2019-10-06 DIAGNOSIS — K219 Gastro-esophageal reflux disease without esophagitis: Secondary | ICD-10-CM | POA: Diagnosis not present

## 2019-10-06 DIAGNOSIS — Z23 Encounter for immunization: Secondary | ICD-10-CM

## 2019-10-06 DIAGNOSIS — Z0000 Encounter for general adult medical examination without abnormal findings: Secondary | ICD-10-CM

## 2019-10-06 DIAGNOSIS — H6122 Impacted cerumen, left ear: Secondary | ICD-10-CM | POA: Diagnosis not present

## 2019-10-06 DIAGNOSIS — Z1211 Encounter for screening for malignant neoplasm of colon: Secondary | ICD-10-CM | POA: Diagnosis not present

## 2019-10-06 DIAGNOSIS — E108 Type 1 diabetes mellitus with unspecified complications: Secondary | ICD-10-CM | POA: Diagnosis not present

## 2019-10-06 DIAGNOSIS — H409 Unspecified glaucoma: Secondary | ICD-10-CM

## 2019-10-06 DIAGNOSIS — E785 Hyperlipidemia, unspecified: Secondary | ICD-10-CM

## 2019-10-06 MED ORDER — ATORVASTATIN CALCIUM 80 MG PO TABS: 80.0000 mg | ORAL_TABLET | Freq: Every day | ORAL | 1 refills | Status: DC

## 2019-10-06 MED ORDER — LISINOPRIL 2.5 MG PO TABS: 2.5000 mg | ORAL_TABLET | Freq: Every day | ORAL | 1 refills | Status: DC

## 2019-10-06 MED ORDER — HYDROCHLOROTHIAZIDE 25 MG PO TABS: 25.0000 mg | ORAL_TABLET | Freq: Every day | ORAL | 1 refills | Status: DC

## 2019-10-06 MED ORDER — SILDENAFIL CITRATE 20 MG PO TABS: ORAL_TABLET | ORAL | 11 refills | Status: DC

## 2019-10-06 NOTE — Progress Notes (Signed)
Established Patient Office Visit     CC/Reason for Visit: Annual preventive exam and subsequent Medicare wellness visit  HPI: Christopher [name] is a 73 y.o. male who is coming in today for the above mentioned reasons. Past Medical History is significant for: Well-controlled hypertension, glaucoma, labile insulin-dependent diabetes followed by endocrinology, Dr. Loanne Drilling.  Hyperlipidemia that has not been well controlled.  He has no acute complaints today, has follow-up with his endocrinologist tomorrow.  He has routine eye and dental care.  He has not received any hearing issues, he remains active by exercising at the [facility] he walks on the treadmill and does some frequently.  He is due for flu and shingles vaccination.  He is also due for colonoscopy.  He has routine eye exams.   Past Medical/Surgical History: Past Medical History:  Diagnosis Date  . Diabetes mellitus type II   . GERD (gastroesophageal reflux disease)   . Glaucoma   . Hyperlipidemia     Past Surgical History:  Procedure Laterality Date  . HERNIA REPAIR    . [place]    Social History:  reports that he has never smoked. He has never used smokeless tobacco. He reports that he does not drink alcohol or use drugs.  Allergies: No Known Allergies  Family History:  Family History  Problem Relation Age of Onset  . Alcohol abuse Father   . Diabetes Sister      Current Outpatient Medications:  .  Alcohol Swabs (B-D SINGLE USE SWABS REGULAR) PADS, USE TO CHECK BLOOD SUGAR 2 TIMES PER DAY, Disp: 200 each, Rfl: prn .  ALPHAGAN P 0.1 % SOLN, Place 1 drop into both eyes daily. , Disp: , Rfl:  .  aspirin EC 81 MG EC tablet, Take 81 mg by mouth daily.  , Disp: , Rfl:  .  atorvastatin (LIPITOR) 80 MG tablet, Take 1 tablet (80 mg total) by mouth daily., Disp: 90 tablet, Rfl: 1 .  Continuous Blood Gluc Receiver (FREESTYLE LIBRE 14 DAY READER) DEVI, 1 Device by Other route See admin instructions.,  Disp: 1 Device, Rfl: 0 .  Continuous Blood Gluc Sensor (FREESTYLE LIBRE 14 DAY SENSOR) MISC, CHANGE EVERY 14 DAYS, Disp: 6 each, Rfl: 0 .  dorzolamide-timolol (COSOPT) 22.3-6.8 MG/ML ophthalmic solution, Place 1 drop into both eyes 2 (two) times daily. , Disp: , Rfl:  .  glucagon 1 MG injection, Inject 1 mg into the muscle once as needed. (Patient taking differently: Inject 1 mg into the muscle once as needed (for blood sugar). ), Disp: 1 each, Rfl: 12 .  hydrochlorothiazide (HYDRODIURIL) 25 MG tablet, Take 1 tablet (25 mg total) by mouth daily., Disp: 90 tablet, Rfl: 1 .  Insulin Glargine (LANTUS SOLOSTAR) 100 UNIT/ML Solostar Pen, Inject 80 Units into the skin every morning. And pen needles 1/day, Disp: 10 pen, Rfl: PRN .  Insulin Pen Needle (PEN NEEDLES 5/16") 30G X 8 MM MISC, 1 Device by Does not apply route daily., Disp: 90 each, Rfl: 3 .  lisinopril (ZESTRIL) 2.5 MG tablet, Take 1 tablet (2.5 mg total) by mouth daily., Disp: 90 tablet, Rfl: 1 .  Multiple Vitamins-Minerals (CENTRUM SILVER ULTRA MENS PO), Take 1 tablet by mouth daily. , Disp: , Rfl:  .  sildenafil (REVATIO) 20 MG tablet, Take 1 to 2 tabs 2 - 3 hours before sex, Disp: 20 tablet, Rfl: 11  Review of Systems:  Constitutional: Denies fever, chills, diaphoresis, appetite change and fatigue.  HEENT:  Denies photophobia, eye pain, redness, hearing loss, ear pain, congestion, sore throat, rhinorrhea, sneezing, mouth sores, trouble swallowing, neck pain, neck stiffness and tinnitus.   Respiratory: Denies SOB, DOE, cough, chest tightness,  and wheezing.   Cardiovascular: Denies chest pain, palpitations and leg swelling.  Gastrointestinal: Denies nausea, vomiting, abdominal pain, diarrhea, constipation, blood in stool and abdominal distention.  Genitourinary: Denies dysuria, urgency, frequency, hematuria, flank pain and difficulty urinating.  Endocrine: Denies: hot or cold intolerance, sweats, changes in hair or nails, polyuria, polydipsia.  Musculoskeletal: Denies myalgias, back pain, joint swelling, arthralgias and gait problem.  Skin: Denies pallor, rash and wound.  Neurological: Denies dizziness, seizures, syncope, weakness, light-headedness, numbness and headaches.  Hematological: Denies adenopathy. Easy bruising, personal or family bleeding history  Psychiatric/Behavioral: Denies suicidal ideation, mood changes, confusion, nervousness, sleep disturbance and agitation    Physical Exam: Vitals:   10/06/19 0808  BP: 120/80  Pulse: 72  Temp: (!) 97.3 F (36.3 C)  TempSrc: Temporal  SpO2: 96%  Weight: 213 lb 4.8 oz (96.8 kg)  Height: 6' 3"  (1.905 m)    Body mass index is 26.66 kg/m.   Constitutional: NAD, calm, comfortable Eyes: PERRL, lids and conjunctivae normal ENMT: Mucous membranes are moist. Posterior pharynx clear of any exudate or lesions. Normal dentition. Tympanic membrane is pearly white, no erythema or bulging on the right, left is obstructed by cerumen. Neck: normal, supple, no masses, no thyromegaly Respiratory: clear to auscultation bilaterally, no wheezing, no crackles. Normal respiratory effort. No accessory muscle use.  Cardiovascular: Regular rate and rhythm, no murmurs / rubs / gallops. No extremity edema. 2+ pedal pulses. No carotid bruits.  Abdomen: no tenderness, no masses palpated. No hepatosplenomegaly. Bowel sounds positive.  Musculoskeletal: no clubbing / cyanosis. No joint deformity upper and lower extremities. Good ROM, no contractures. Normal muscle tone.  Skin: no rashes, lesions, ulcers. No induration Neurologic: CN 2-12 grossly intact. Sensation intact, DTR normal. Strength 5/5 in all 4.  Psychiatric: Normal judgment and insight. Alert and oriented x 3. Normal mood.    Subsequent Medicare wellness visit   1. Risk factors, based on past  M,S,F -cardiovascular disease risk factors include age, gender, history of diabetes, history of hyperlipidemia, history of hypertension   2.   Physical activities: Walks and does weight training 3 to 4 days a week   3.  Depression/mood:  Stable, not depressed   4.  Hearing:  No perceived issues   5.  ADL's: Independent in all ADLs   6.  Fall risk:  Low fall risk   7.  Home safety: No problems identified   8.  Height weight, and visual acuity: Height and weight as above, visual acuity is 20/32 with each eye independently and 20/25 with each eye   9.  Counseling:  Advised to schedule colonoscopy with GI and to obtain shingles vaccine at pharmacy   10. Lab orders based on risk factors: Laboratory update will be reviewed   11. Referral :  None today   12. Care plan:  Follow-up with me in 3 to 4 months   13. Cognitive assessment:  No cognitive impairment   14. Screening: Patient provided with a written and personalized 5-10 year screening schedule in the AVS.   yes   15. Provider List Update:   PCP, endocrinology, Dr. Loanne Drilling  16. Advance Directives: Full code     Office Visit from 10/06/2019 in [place] at [facility] Total Score  0  Fall Risk  10/06/2019 03/05/2019 08/26/2018 05/21/2018 05/13/2018  Falls in the past year? 0 0 No No No  Number falls in past yr: 0 0 - - -  Injury with Fall? 0 0 - - -     Impression and Plan:  Routine general medical examination at a health care facility  -Has routine eye and dental care. -We will receive flu vaccine in office today, will go to pharmacy to obtain shingles vaccination series.  Otherwise immunizations are up-to-date. -He is not fasting today so will return later this week for screening labs. -Healthy lifestyle has been discussed in detail. -Is overdue for screening colonoscopy, will send referral to GI. -He has routine eye exams, last in September that was negative for diabetic retinopathy. -Check PSA for prostate cancer screening.  Hyperlipidemia, unspecified hyperlipidemia type  -Last LDL was 159 in September 2019. -Recheck lipids today, he  is at maximum dose of Lipitor, may need to add ezetimibe plus minus PSK 9 inhibitor.  Hearing loss of left ear due to cerumen impaction -Cerumen Desimpaction  Warm water was applied and gentle ear lavage performed on left ear. There were no complications and following the desimpaction the tympanic membranes were visible. Tympanic membranes are intact following the procedure. Auditory canals are normal. The patient reported relief of symptoms after removal of cerumen.  Type 1 diabetes mellitus with unspecified complications ([place]) -Most recent A1c was 9 in September 2020, he is followed by endocrinology.  Glaucoma, unspecified glaucoma type, unspecified laterality -Followed routinely by ophthalmology, Dr. Sabra Heck  Gastroesophageal reflux disease without esophagitis -Stable, not on PPI therapy.    Patient Instructions  -Nice seeing you today!!  -Come back fasting later this week for your blood work. Will let you know once results are available.  -Flu vaccine today.  -Remember to get your shingles vaccine at the pharmacy.  -Schedule follow up in 4 months.   Preventive Care 72 Years and Older, Male Preventive care refers to lifestyle choices and visits with your health care provider that can promote health and wellness. This includes:  A yearly physical exam. This is also called an annual well check.  Regular dental and eye exams.  Immunizations.  Screening for certain conditions.  Healthy lifestyle choices, such as diet and exercise. What can I expect for my preventive care visit? Physical exam Your health care provider will check:  Height and weight. These may be used to calculate body mass index (BMI), which is a measurement that tells if you are at a healthy weight.  Heart rate and blood pressure.  Your skin for abnormal spots. Counseling Your health care provider may ask you questions about:  Alcohol, tobacco, and drug use.  Emotional well-being.  Home and  relationship well-being.  Sexual activity.  Eating habits.  History of falls.  Memory and ability to understand (cognition).  Work and work Statistician. What immunizations do I need?  Influenza (flu) vaccine  This is recommended every year. Tetanus, diphtheria, and pertussis (Tdap) vaccine  You may need a Td booster every 10 years. Varicella (chickenpox) vaccine  You may need this vaccine if you have not already been vaccinated. Zoster (shingles) vaccine  You may need this after age 57. Pneumococcal conjugate (PCV13) vaccine  One dose is recommended after age 57. Pneumococcal polysaccharide (PPSV23) vaccine  One dose is recommended after age 39. Measles, mumps, and rubella (MMR) vaccine  You may need at least one dose of MMR if you were born in 1957 or later. You  may also need a second dose. Meningococcal conjugate (MenACWY) vaccine  You may need this if you have certain conditions. Hepatitis A vaccine  You may need this if you have certain conditions or if you travel or work in places where you may be exposed to hepatitis A. Hepatitis B vaccine  You may need this if you have certain conditions or if you travel or work in places where you may be exposed to hepatitis B. Haemophilus influenzae type b (Hib) vaccine  You may need this if you have certain conditions. You may receive vaccines as individual doses or as more than one vaccine together in one shot (combination vaccines). Talk with your health care provider about the risks and benefits of combination vaccines. What tests do I need? Blood tests  Lipid and cholesterol levels. These may be checked every 5 years, or more frequently depending on your overall health.  Hepatitis C test.  Hepatitis B test. Screening  Lung cancer screening. You may have this screening every year starting at age 80 if you have a 30-pack-year history of smoking and currently smoke or have quit within the past 15 years.   Colorectal cancer screening. All adults should have this screening starting at age 47 and continuing until age 34. Your health care provider may recommend screening at age 23 if you are at increased risk. You will have tests every 1-10 years, depending on your results and the type of screening test.  Prostate cancer screening. Recommendations will vary depending on your family history and other risks.  Diabetes screening. This is done by checking your blood sugar (glucose) after you have not eaten for a while (fasting). You may have this done every 1-3 years.  Abdominal aortic aneurysm (AAA) screening. You may need this if you are a current or former smoker.  Sexually transmitted disease (STD) testing. Follow these instructions at home: Eating and drinking  Eat a diet that includes fresh fruits and vegetables, whole grains, lean protein, and low-fat dairy products. Limit your intake of foods with high amounts of sugar, saturated fats, and salt.  Take vitamin and mineral supplements as recommended by your health care provider.  Do not drink alcohol if your health care provider tells you not to drink.  If you drink alcohol: ? Limit how much you have to 0-2 drinks a day. ? Be aware of how much alcohol is in your drink. In the [place] one drink equals one 12 oz bottle of beer (355 mL), one 5 oz glass of wine (148 mL), or one 1 oz glass of hard liquor (44 mL). Lifestyle  Take daily care of your teeth and gums.  Stay active. Exercise for at least 30 minutes on 5 or more days each week.  Do not use any products that contain nicotine or tobacco, such as cigarettes, e-cigarettes, and chewing tobacco. If you need help quitting, ask your health care provider.  If you are sexually active, practice safe sex. Use a condom or other form of protection to prevent STIs (sexually transmitted infections).  Talk with your health care provider about taking a low-dose aspirin or statin. What's next?  Visit  your health care provider once a year for a well check visit.  Ask your health care provider how often you should have your eyes and teeth checked.  Stay up to date on all vaccines. This information is not intended to replace advice given to you by your health care provider. Make sure you discuss any questions you  have with your health care provider. Document Released: 12/29/2015 Document Revised: 11/26/2018 Document Reviewed: 11/26/2018 Elsevier Patient Education  2020 [place], [place] [facility]

## 2019-10-06 NOTE — Patient Instructions (Signed)
-Nice seeing you today!!  -Come back fasting later this week for your blood work. Will let you know once results are available.  -Flu vaccine today.  -Remember to get your shingles vaccine at the pharmacy.  -Schedule follow up in 4 months.   Preventive Care 81 Years and Older, Male Preventive care refers to lifestyle choices and visits with your health care provider that can promote health and wellness. This includes:  A yearly physical exam. This is also called an annual well check.  Regular dental and eye exams.  Immunizations.  Screening for certain conditions.  Healthy lifestyle choices, such as diet and exercise. What can I expect for my preventive care visit? Physical exam Your health care provider will check:  Height and weight. These may be used to calculate body mass index (BMI), which is a measurement that tells if you are at a healthy weight.  Heart rate and blood pressure.  Your skin for abnormal spots. Counseling Your health care provider may ask you questions about:  Alcohol, tobacco, and drug use.  Emotional well-being.  Home and relationship well-being.  Sexual activity.  Eating habits.  History of falls.  Memory and ability to understand (cognition).  Work and work Statistician. What immunizations do I need?  Influenza (flu) vaccine  This is recommended every year. Tetanus, diphtheria, and pertussis (Tdap) vaccine  You may need a Td booster every 10 years. Varicella (chickenpox) vaccine  You may need this vaccine if you have not already been vaccinated. Zoster (shingles) vaccine  You may need this after age 62. Pneumococcal conjugate (PCV13) vaccine  One dose is recommended after age 68. Pneumococcal polysaccharide (PPSV23) vaccine  One dose is recommended after age 10. Measles, mumps, and rubella (MMR) vaccine  You may need at least one dose of MMR if you were born in 1957 or later. You may also need a second dose.  Meningococcal conjugate (MenACWY) vaccine  You may need this if you have certain conditions. Hepatitis A vaccine  You may need this if you have certain conditions or if you travel or work in places where you may be exposed to hepatitis A. Hepatitis B vaccine  You may need this if you have certain conditions or if you travel or work in places where you may be exposed to hepatitis B. Haemophilus influenzae type b (Hib) vaccine  You may need this if you have certain conditions. You may receive vaccines as individual doses or as more than one vaccine together in one shot (combination vaccines). Talk with your health care provider about the risks and benefits of combination vaccines. What tests do I need? Blood tests  Lipid and cholesterol levels. These may be checked every 5 years, or more frequently depending on your overall health.  Hepatitis C test.  Hepatitis B test. Screening  Lung cancer screening. You may have this screening every year starting at age 30 if you have a 30-pack-year history of smoking and currently smoke or have quit within the past 15 years.  Colorectal cancer screening. All adults should have this screening starting at age 68 and continuing until age 60. Your health care provider may recommend screening at age 105 if you are at increased risk. You will have tests every 1-10 years, depending on your results and the type of screening test.  Prostate cancer screening. Recommendations will vary depending on your family history and other risks.  Diabetes screening. This is done by checking your blood sugar (glucose) after you have not eaten  for a while (fasting). You may have this done every 1-3 years.  Abdominal aortic aneurysm (AAA) screening. You may need this if you are a current or former smoker.  Sexually transmitted disease (STD) testing. Follow these instructions at home: Eating and drinking  Eat a diet that includes fresh fruits and vegetables, whole  grains, lean protein, and low-fat dairy products. Limit your intake of foods with high amounts of sugar, saturated fats, and salt.  Take vitamin and mineral supplements as recommended by your health care provider.  Do not drink alcohol if your health care provider tells you not to drink.  If you drink alcohol: ? Limit how much you have to 0-2 drinks a day. ? Be aware of how much alcohol is in your drink. In the [place] one drink equals one 12 oz bottle of beer (355 mL), one 5 oz glass of wine (148 mL), or one 1 oz glass of hard liquor (44 mL). Lifestyle  Take daily care of your teeth and gums.  Stay active. Exercise for at least 30 minutes on 5 or more days each week.  Do not use any products that contain nicotine or tobacco, such as cigarettes, e-cigarettes, and chewing tobacco. If you need help quitting, ask your health care provider.  If you are sexually active, practice safe sex. Use a condom or other form of protection to prevent STIs (sexually transmitted infections).  Talk with your health care provider about taking a low-dose aspirin or statin. What's next?  Visit your health care provider once a year for a well check visit.  Ask your health care provider how often you should have your eyes and teeth checked.  Stay up to date on all vaccines. This information is not intended to replace advice given to you by your health care provider. Make sure you discuss any questions you have with your health care provider. Document Released: 12/29/2015 Document Revised: 11/26/2018 Document Reviewed: 11/26/2018 Elsevier Patient Education  2020 [employer].

## 2019-10-07 ENCOUNTER — Encounter: Payer: Self-pay | Admitting: Endocrinology

## 2019-10-07 ENCOUNTER — Ambulatory Visit (INDEPENDENT_AMBULATORY_CARE_PROVIDER_SITE_OTHER): Payer: Medicare HMO | Admitting: Endocrinology

## 2019-10-07 VITALS — BP 104/64 | HR 72 | Ht 75.0 in | Wt 212.4 lb

## 2019-10-07 DIAGNOSIS — E108 Type 1 diabetes mellitus with unspecified complications: Secondary | ICD-10-CM

## 2019-10-07 DIAGNOSIS — E1042 Type 1 diabetes mellitus with diabetic polyneuropathy: Secondary | ICD-10-CM

## 2019-10-07 MED ORDER — LANTUS SOLOSTAR 100 UNIT/ML ~~LOC~~ SOPN: 65.0000 [IU] | PEN_INJECTOR | SUBCUTANEOUS | 99 refills | Status: DC

## 2019-10-07 NOTE — Progress Notes (Signed)
Subjective:    Patient ID: Christopher [name], male    DOB: 10-Sep-1946, 73 y.o.   MRN: [id number]  HPI Pt returns for f/u of diabetes mellitus:  DM type: 1 Dx'ed: 2010.   Complications: polyneuropathy and renal insuff.   Therapy: insulin since 2013.   DKA: never.   Severe hypoglycemia: last episode was in 2018.   Pancreatitis: never.  Other: he is back on QD insulin, after noncompliance with multiple daily injections; he could not take lantus, due to AM hypoglycemia and PM hyperglycemia; however, he changed back after poor results with faster-acting qd insulin; he uses syringe and vial, after difficulty operating pen.  Interval history: I reviewed continuous glucose monitor data.  Glucose varies from 50-400.  It is in general higher as the day goes on, but not necessarily so.  pt states he feels well in general. He had to reduce lantus to 70 units qam, due to fasting hypoglycemia Past Medical History:  Diagnosis Date  . Diabetes mellitus type II   . GERD (gastroesophageal reflux disease)   . Glaucoma   . Hyperlipidemia     Past Surgical History:  Procedure Laterality Date  . HERNIA REPAIR    . [place]    Social History   Socioeconomic History  . Marital status: Married    Spouse name: Not on file  . Number of children: Not on file  . Years of education: Not on file  . Highest education level: Not on file  Occupational History  . Not on file  Social Needs  . Financial resource strain: Not on file  . Food insecurity    Worry: Not on file    Inability: Not on file  . Transportation needs    Medical: Not on file    Non-medical: Not on file  Tobacco Use  . Smoking status: Never Smoker  . Smokeless tobacco: Never Used  Substance and Sexual Activity  . Alcohol use: No  . Drug use: No  . Sexual activity: Not on file  Lifestyle  . Physical activity    Days per week: Not on file    Minutes per session: Not on file  . Stress: Not on file   Relationships  . Social Herbalist on phone: Not on file    Gets together: Not on file    Attends religious service: Not on file    Active member of club or organization: Not on file    Attends meetings of clubs or organizations: Not on file    Relationship status: Not on file  . Intimate partner violence    Fear of current or ex partner: Not on file    Emotionally abused: Not on file    Physically abused: Not on file    Forced sexual activity: Not on file  Other Topics Concern  . Not on file  Social History Narrative  . Not on file    Current Outpatient Medications on File Prior to Visit  Medication Sig Dispense Refill  . Alcohol Swabs (B-D SINGLE USE SWABS REGULAR) PADS USE TO CHECK BLOOD SUGAR 2 TIMES PER DAY 200 each prn  . ALPHAGAN P 0.1 % SOLN Place 1 drop into both eyes daily.     Marland Kitchen aspirin EC 81 MG EC tablet Take 81 mg by mouth daily.      Marland Kitchen atorvastatin (LIPITOR) 80 MG tablet Take 1 tablet (80 mg total) by mouth daily. 90 tablet 1  .  Continuous Blood Gluc Receiver (FREESTYLE LIBRE 14 DAY READER) DEVI 1 Device by Other route See admin instructions. 1 Device 0  . Continuous Blood Gluc Sensor (FREESTYLE LIBRE 14 DAY SENSOR) MISC CHANGE EVERY 14 DAYS 6 each 0  . dorzolamide-timolol (COSOPT) 22.3-6.8 MG/ML ophthalmic solution Place 1 drop into both eyes 2 (two) times daily.     Marland Kitchen glucagon 1 MG injection Inject 1 mg into the muscle once as needed. (Patient taking differently: Inject 1 mg into the muscle once as needed (for blood sugar). ) 1 each 12  . hydrochlorothiazide (HYDRODIURIL) 25 MG tablet Take 1 tablet (25 mg total) by mouth daily. 90 tablet 1  . Insulin Pen Needle (PEN NEEDLES 5/16") 30G X 8 MM MISC 1 Device by Does not apply route daily. 90 each 3  . lisinopril (ZESTRIL) 2.5 MG tablet Take 1 tablet (2.5 mg total) by mouth daily. 90 tablet 1  . Multiple Vitamins-Minerals (CENTRUM SILVER ULTRA MENS PO) Take 1 tablet by mouth daily.     . sildenafil (REVATIO) 20  MG tablet Take 1 to 2 tabs 2 - 3 hours before sex 20 tablet 11   No current facility-administered medications on file prior to visit.     No Known Allergies  Family History  Problem Relation Age of Onset  . Alcohol abuse Father   . Diabetes Sister     BP 104/64 (BP Location: Left Arm, Patient Position: Sitting, Cuff Size: Normal)   Pulse 72   Ht 6\' 3"  (1.905 m)   Wt 212 lb 6.4 oz (96.3 kg)   SpO2 95%   BMI 26.55 kg/m   Review of Systems Denies LOC    Objective:   Physical Exam VITAL SIGNS:  See vs page GENERAL: no distress Pulses: dorsalis pedis intact bilat.   MSK: no deformity of the feet CV: no leg edema Skin:  no ulcer on the feet.  normal color and temp on the feet. Neuro: sensation is intact to touch on the feet Ext: there is bilateral onychomycosis of the toenails  Lab Results  Component Value Date   HGBA1C 9.0 (A) 09/08/2019   Lab Results  Component Value Date   CREATININE 1.38 10/08/2019   BUN 18 10/08/2019   NA 142 10/08/2019   K 3.4 (L) 10/08/2019   CL 105 10/08/2019   CO2 28 10/08/2019       Assessment & Plan:  Type 1 DM, with PN: he needs increased rx.  Hypoglycemia: this limits aggressiveness of glycemic control.  We have to address this first.    Patient Instructions  check your blood sugar twice a day.  vary the time of day when you check, between before the 3 meals, and at bedtime.  also check if you have symptoms of your blood sugar being too high or too low.  please keep a record of the readings and bring it to your next appointment here (or you can bring the meter itself).  You can write it on any piece of paper.  please call us sooner if your blood sugar goes below 70, or if you have a lot of readings over 200.   Please reduce the Lantus to 65 units each morning.   On days you are going to be active, take just 50 units.  To avoid the blood sugar going low in the middles of the night, eat a light snack at bedtime.   On this type of  insulin schedule, you should eat meals on a  regular schedule.  If a meal is missed or significantly delayed, your blood sugar could go low.   Please come back for a follow-up appointment in 1 month.

## 2019-10-07 NOTE — Patient Instructions (Addendum)
check your blood sugar twice a day.  vary the time of day when you check, between before the 3 meals, and at bedtime.  also check if you have symptoms of your blood sugar being too high or too low.  please keep a record of the readings and bring it to your next appointment here (or you can bring the meter itself).  You can write it on any piece of paper.  please call us sooner if your blood sugar goes below 70, or if you have a lot of readings over 200.   Please reduce the Lantus to 65 units each morning.   On days you are going to be active, take just 50 units.  To avoid the blood sugar going low in the middles of the night, eat a light snack at bedtime.   On this type of insulin schedule, you should eat meals on a regular schedule.  If a meal is missed or significantly delayed, your blood sugar could go low.   Please come back for a follow-up appointment in 1 month.

## 2019-10-08 ENCOUNTER — Other Ambulatory Visit (INDEPENDENT_AMBULATORY_CARE_PROVIDER_SITE_OTHER): Payer: Medicare HMO

## 2019-10-08 ENCOUNTER — Other Ambulatory Visit: Payer: Self-pay

## 2019-10-08 DIAGNOSIS — E108 Type 1 diabetes mellitus with unspecified complications: Secondary | ICD-10-CM

## 2019-10-08 DIAGNOSIS — Z0000 Encounter for general adult medical examination without abnormal findings: Secondary | ICD-10-CM

## 2019-10-08 LAB — LIPID PANEL
Cholesterol: 148 mg/dL (ref 0–200)
HDL: 39.7 mg/dL (ref >39.00)
LDL Cholesterol: 95 mg/dL (ref 0–99)
NonHDL: 107.93
Total CHOL/HDL Ratio: 4
Triglycerides: 66 mg/dL (ref 0.0–149.0)
VLDL: 13.2 mg/dL (ref 0.0–40.0)

## 2019-10-08 LAB — COMPREHENSIVE METABOLIC PANEL
ALT: 17 U/L (ref 0–53)
AST: 21 U/L (ref 0–37)
Albumin: 4.1 g/dL (ref 3.5–5.2)
Alkaline Phosphatase: 83 U/L (ref 39–117)
BUN: 18 mg/dL (ref 6–23)
CO2: 28 mEq/L (ref 19–32)
Calcium: 9.3 mg/dL (ref 8.4–10.5)
Chloride: 105 mEq/L (ref 96–112)
Creatinine, Ser: 1.38 mg/dL (ref 0.40–1.50)
GFR: 61.24 mL/min (ref >60.00)
Glucose, Bld: 116 mg/dL — ABNORMAL HIGH (ref 70–99)
Potassium: 3.4 mEq/L — ABNORMAL LOW (ref 3.5–5.1)
Sodium: 142 mEq/L (ref 135–145)
Total Bilirubin: 0.9 mg/dL (ref 0.2–1.2)
Total Protein: 6.6 g/dL (ref 6.0–8.3)

## 2019-10-08 LAB — CBC WITH DIFFERENTIAL/PLATELET
Basophils Absolute: 0 10*3/uL (ref 0.0–0.1)
Basophils Relative: 0.7 % (ref 0.0–3.0)
Eosinophils Absolute: 0.2 10*3/uL (ref 0.0–0.7)
Eosinophils Relative: 4.5 % (ref 0.0–5.0)
HCT: 41 % (ref 39.0–52.0)
Hemoglobin: 13.9 g/dL (ref 13.0–17.0)
Lymphocytes Relative: 31.1 % (ref 12.0–46.0)
Lymphs Abs: 1.5 10*3/uL (ref 0.7–4.0)
MCHC: 33.9 g/dL (ref 30.0–36.0)
MCV: 79.4 fl (ref 78.0–100.0)
Monocytes Absolute: 0.6 10*3/uL (ref 0.1–1.0)
Monocytes Relative: 12.9 % — ABNORMAL HIGH (ref 3.0–12.0)
Neutro Abs: 2.5 10*3/uL (ref 1.4–7.7)
Neutrophils Relative %: 50.8 % (ref 43.0–77.0)
Platelets: 221 10*3/uL (ref 150.0–400.0)
RBC: 5.16 Mil/uL (ref 4.22–5.81)
RDW: 14.6 % (ref 11.5–15.5)
WBC: 4.9 10*3/uL (ref 4.0–10.5)

## 2019-10-08 LAB — VITAMIN D 25 HYDROXY (VIT D DEFICIENCY, FRACTURES): VITD: 38.99 ng/mL (ref 30.00–100.00)

## 2019-10-08 LAB — PSA: PSA: 0.54 ng/mL (ref 0.10–4.00)

## 2019-10-08 LAB — TSH: TSH: 0.77 u[IU]/mL (ref 0.35–4.50)

## 2019-10-08 LAB — VITAMIN B12: Vitamin B-12: 538 pg/mL (ref 211–911)

## 2019-10-18 ENCOUNTER — Other Ambulatory Visit: Payer: Self-pay

## 2019-10-18 ENCOUNTER — Telehealth: Payer: Self-pay | Admitting: Endocrinology

## 2019-10-18 DIAGNOSIS — E108 Type 1 diabetes mellitus with unspecified complications: Secondary | ICD-10-CM

## 2019-10-18 MED ORDER — FREESTYLE LIBRE 14 DAY SENSOR MISC: 1.0000 | 0 refills | Status: DC

## 2019-10-18 NOTE — Telephone Encounter (Signed)
MEDICATION: FREESTYLE LIBRE 14 DAY SENSOR  PHARMACY:  [facility] on [address]   IS THIS A 90 DAY SUPPLY :   IS PATIENT OUT OF MEDICATION: yes  IF NOT; HOW MUCH IS LEFT:   LAST APPOINTMENT DATE: @10 /22/2020  NEXT APPOINTMENT DATE:@11 /25/2020  DO WE HAVE YOUR PERMISSION TO LEAVE A DETAILED MESSAGE: yes [phone]  OTHER COMMENTS:    **Let patient know to contact pharmacy at the end of the day to make sure medication is ready. **  ** Please notify patient to allow 48-72 hours to process**  **Encourage patient to contact the pharmacy for refills or they can request refills through [facility]**

## 2019-10-18 NOTE — Telephone Encounter (Signed)
Continuous Blood Gluc Sensor (FREESTYLE LIBRE 14 DAY SENSOR) MISC 6 each 0 10/18/2019    Sig - Route: 1 each by Other route every 14 (fourteen) days. - Other   Sent to pharmacy as: Continuous Blood Gluc Sensor (FREESTYLE LIBRE [place]) Misc   E-Prescribing Status: Receipt confirmed by pharmacy (10/18/2019 9:54 AM EST)

## 2019-10-20 ENCOUNTER — Ambulatory Visit (AMBULATORY_SURGERY_CENTER): Payer: Medicare HMO | Admitting: *Deleted

## 2019-10-20 ENCOUNTER — Other Ambulatory Visit: Payer: Self-pay

## 2019-10-20 VITALS — Temp 96.9°F | Ht 72.5 in | Wt 214.6 lb

## 2019-10-20 DIAGNOSIS — Z1211 Encounter for screening for malignant neoplasm of colon: Secondary | ICD-10-CM

## 2019-10-20 DIAGNOSIS — Z1159 Encounter for screening for other viral diseases: Secondary | ICD-10-CM

## 2019-10-20 NOTE — Progress Notes (Signed)
No egg or soy allergy  No home oxygen use or problems with anesthesia  No medications for weight loss taken  Pt is aware that care partner will wait in the car during procedure; if they feel like they will be too hot or cold to wait in the car; they may wait in the 4 th floor lobby. Patient is aware to bring only one care partner. We want them to wear a mask (we do not have any that we can provide them), practice social distancing, and we will check their temperatures when they get here.  I did remind the patient that their care partner needs to stay in the parking lot the entire time and have a cell phone available, we will call them when the pt is ready for discharge. Patient will wear mask into building.   Covid test- 10-29-19 at 820.  Pt informed he must have this done or we cannot do procedure- understanding voiced.

## 2019-10-26 ENCOUNTER — Telehealth: Payer: Self-pay

## 2019-10-26 NOTE — Telephone Encounter (Signed)
Dr. Loanne Drilling completed [place] DMV forms, ENDOCRINOLOGY PORTION ONLY. Called pt and advised that forms are ready for pick up. Advised he is welcome to stop by at his leisure to pick up these forms. Reminded, other sections that [place] DMV requires to be completed MUST be completed by the appropriate provider. Verbalized acceptance and understanding.

## 2019-10-29 ENCOUNTER — Other Ambulatory Visit: Payer: Self-pay | Admitting: Gastroenterology

## 2019-10-29 ENCOUNTER — Ambulatory Visit (INDEPENDENT_AMBULATORY_CARE_PROVIDER_SITE_OTHER): Payer: Medicare HMO

## 2019-10-29 DIAGNOSIS — Z1159 Encounter for screening for other viral diseases: Secondary | ICD-10-CM | POA: Diagnosis not present

## 2019-10-29 LAB — SARS CORONAVIRUS 2 (TAT 6-24 HRS): SARS Coronavirus 2: NEGATIVE

## 2019-11-03 ENCOUNTER — Ambulatory Visit (AMBULATORY_SURGERY_CENTER): Payer: Medicare HMO | Admitting: Gastroenterology

## 2019-11-03 ENCOUNTER — Other Ambulatory Visit: Payer: Self-pay

## 2019-11-03 ENCOUNTER — Encounter: Payer: Self-pay | Admitting: Gastroenterology

## 2019-11-03 VITALS — BP 123/86 | HR 67 | Temp 97.8°F | Resp 19 | Ht 75.0 in | Wt 214.6 lb

## 2019-11-03 DIAGNOSIS — D123 Benign neoplasm of transverse colon: Secondary | ICD-10-CM

## 2019-11-03 DIAGNOSIS — Z1211 Encounter for screening for malignant neoplasm of colon: Secondary | ICD-10-CM | POA: Diagnosis not present

## 2019-11-03 DIAGNOSIS — Z86010 Personal history of colonic polyps: Secondary | ICD-10-CM | POA: Diagnosis not present

## 2019-11-03 DIAGNOSIS — I10 Essential (primary) hypertension: Secondary | ICD-10-CM | POA: Diagnosis not present

## 2019-11-03 DIAGNOSIS — E119 Type 2 diabetes mellitus without complications: Secondary | ICD-10-CM | POA: Diagnosis not present

## 2019-11-03 MED ORDER — SODIUM CHLORIDE 0.9 % IV SOLN: 500.0000 mL | Freq: Once | INTRAVENOUS | Status: DC

## 2019-11-03 NOTE — Patient Instructions (Signed)
Handouts given for hemorrhoids and polyps.  YOU HAD AN ENDOSCOPIC PROCEDURE TODAY AT THE [facility]:   Refer to the procedure report that was given to you for any specific questions about what was found during the examination.  If the procedure report does not answer your questions, please call your gastroenterologist to clarify.  If you requested that your care partner not be given the details of your procedure findings, then the procedure report has been included in a sealed envelope for you to review at your convenience later.  YOU SHOULD EXPECT: Some feelings of bloating in the abdomen. Passage of more gas than usual.  Walking can help get rid of the air that was put into your GI tract during the procedure and reduce the bloating. If you had a lower endoscopy (such as a colonoscopy or flexible sigmoidoscopy) you may notice spotting of blood in your stool or on the toilet paper. If you underwent a bowel prep for your procedure, you may not have a normal bowel movement for a few days.  Please Note:  You might notice some irritation and congestion in your nose or some drainage.  This is from the oxygen used during your procedure.  There is no need for concern and it should clear up in a day or so.  SYMPTOMS TO REPORT IMMEDIATELY:   Following lower endoscopy (colonoscopy or flexible sigmoidoscopy):  Excessive amounts of blood in the stool  Significant tenderness or worsening of abdominal pains  Swelling of the abdomen that is new, acute  Fever of 100F or higher   For urgent or emergent issues, a gastroenterologist can be reached at any hour by calling [phone].   DIET:  We do recommend a small meal at first, but then you may proceed to your regular diet.  Drink plenty of fluids but you should avoid alcoholic beverages for 24 hours.  ACTIVITY:  You should plan to take it easy for the rest of today and you should NOT DRIVE or use heavy machinery until tomorrow (because of  the sedation medicines used during the test).    FOLLOW UP: Our staff will call the number listed on your records 48-72 hours following your procedure to check on you and address any questions or concerns that you may have regarding the information given to you following your procedure. If we do not reach you, we will leave a message.  We will attempt to reach you two times.  During this call, we will ask if you have developed any symptoms of COVID 19. If you develop any symptoms (ie: fever, flu-like symptoms, shortness of breath, cough etc.) before then, please call [phone].  If you test positive for Covid 19 in the 2 weeks post procedure, please call and report this information to us.    If any biopsies were taken you will be contacted by phone or by letter within the next 1-3 weeks.  Please call us at [phone] if you have not heard about the biopsies in 3 weeks.    SIGNATURES/CONFIDENTIALITY: You and/or your care partner have signed paperwork which will be entered into your electronic medical record.  These signatures attest to the fact that that the information above on your After Visit Summary has been reviewed and is understood.  Full responsibility of the confidentiality of this discharge information lies with you and/or your care-partner. 

## 2019-11-03 NOTE — Progress Notes (Signed)
Pt's states no medical or surgical changes since previsit or office visit.  Temp jb, vs cw

## 2019-11-03 NOTE — Op Note (Signed)
[place] Patient Name: Christopher [name] Procedure Date: 11/03/2019 8:25 AM MRN: [id number] Endoscopist: Remo Lipps P. Havery Moros , MD Age: 73 Referring MD:  Date of Birth: 1946-03-03 Gender: Male Account #: [id number] Procedure:                Colonoscopy Indications:              Screening for colorectal malignant neoplasm Medicines:                Monitored Anesthesia Care Procedure:                Pre-Anesthesia Assessment:                           - Prior to the procedure, a History and Physical                            was performed, and patient medications and                            allergies were reviewed. The patient's tolerance of                            previous anesthesia was also reviewed. The risks                            and benefits of the procedure and the sedation                            options and risks were discussed with the patient.                            All questions were answered, and informed consent                            was obtained. Prior Anticoagulants: The patient has                            taken no previous anticoagulant or antiplatelet                            agents. ASA Grade Assessment: II - A patient with                            mild systemic disease. After reviewing the risks                            and benefits, the patient was deemed in                            satisfactory condition to undergo the procedure.                           After obtaining informed consent, the colonoscope  was passed under direct vision. Throughout the                            procedure, the patient's blood pressure, pulse, and                            oxygen saturations were monitored continuously. The                            Colonoscope was introduced through the anus and                            advanced to the the cecum, identified by                            appendiceal orifice and  ileocecal valve. The                            colonoscopy was performed without difficulty. The                            patient tolerated the procedure well. The quality                            of the bowel preparation was adequate. The                            ileocecal valve, appendiceal orifice, and rectum                            were photographed. Scope In: 8:35:14 AM Scope Out: 9:00:24 AM Scope Withdrawal Time: 0 hours 20 minutes 7 seconds  Total Procedure Duration: 0 hours 25 minutes 10 seconds  Findings:                 The perianal and digital rectal examinations were                            normal.                           A 3 mm polyp was found in the hepatic flexure. The                            polyp was sessile. The polyp was removed with a                            cold snare. Resection and retrieval were complete.                           A 3 mm polyp was found in the transverse colon. The                            polyp was sessile. The polyp was removed with a  cold snare. Resection and retrieval were complete.                           The colon was long and redundant.                           Internal hemorrhoids were found during                            retroflexion. The hemorrhoids were large.                           The prep in the left colon was initially fair.                            Several minutes spent lavaging the colon to achieve                            adequate views in this are. The exam was otherwise                            without abnormality. Complications:            No immediate complications. Estimated blood loss:                            Minimal. Estimated Blood Loss:     Estimated blood loss was minimal. Impression:               - One 3 mm polyp at the hepatic flexure, removed                            with a cold snare. Resected and retrieved.                           - One 3 mm  polyp in the transverse colon, removed                            with a cold snare. Resected and retrieved.                           - Redundant colon.                           - Internal hemorrhoids.                           - The examination was otherwise normal. Recommendation:           - Patient has a contact number available for                            emergencies. The signs and symptoms of potential                            delayed complications were discussed with  the                            patient. Return to normal activities tomorrow.                            Written discharge instructions were provided to the                            patient.                           - Resume previous diet.                           - Continue present medications.                           - Await pathology results. Remo Lipps P. Tiasha Helvie, MD 11/03/2019 9:03:58 AM This report has been signed electronically.

## 2019-11-03 NOTE — Progress Notes (Signed)
Report given to PACU, vss 

## 2019-11-03 NOTE — Progress Notes (Signed)
Called to room to assist during endoscopic procedure.  Patient ID and intended procedure confirmed with present staff. Received instructions for my participation in the procedure from the performing physician.  

## 2019-11-05 ENCOUNTER — Telehealth: Payer: Self-pay

## 2019-11-05 ENCOUNTER — Telehealth: Payer: Self-pay | Admitting: *Deleted

## 2019-11-05 NOTE — Telephone Encounter (Signed)
  Follow up Call-  Call back number 11/03/2019  Post procedure Call Back phone  # [phone]  Permission to leave phone message Yes  Some recent data might be hidden     No ID on voicemail No message left  Will try midday

## 2019-11-05 NOTE — Telephone Encounter (Signed)
Pt returned call and said he is doing good

## 2019-11-05 NOTE — Telephone Encounter (Signed)
No answer for second post procedure follow up call left message for patient to call with questions. SM

## 2019-11-08 ENCOUNTER — Encounter: Payer: Self-pay | Admitting: Gastroenterology

## 2019-11-08 ENCOUNTER — Other Ambulatory Visit: Payer: Self-pay

## 2019-11-10 ENCOUNTER — Other Ambulatory Visit: Payer: Self-pay

## 2019-11-10 ENCOUNTER — Ambulatory Visit (INDEPENDENT_AMBULATORY_CARE_PROVIDER_SITE_OTHER): Payer: Medicare HMO | Admitting: Endocrinology

## 2019-11-10 ENCOUNTER — Encounter: Payer: Self-pay | Admitting: Endocrinology

## 2019-11-10 VITALS — BP 124/70 | HR 78 | Ht 75.0 in | Wt 212.8 lb

## 2019-11-10 DIAGNOSIS — E108 Type 1 diabetes mellitus with unspecified complications: Secondary | ICD-10-CM | POA: Diagnosis not present

## 2019-11-10 DIAGNOSIS — E1069 Type 1 diabetes mellitus with other specified complication: Secondary | ICD-10-CM | POA: Diagnosis not present

## 2019-11-10 DIAGNOSIS — Z794 Long term (current) use of insulin: Secondary | ICD-10-CM | POA: Diagnosis not present

## 2019-11-10 DIAGNOSIS — E1021 Type 1 diabetes mellitus with diabetic nephropathy: Secondary | ICD-10-CM | POA: Diagnosis not present

## 2019-11-10 LAB — POCT GLYCOSYLATED HEMOGLOBIN (HGB A1C): Hemoglobin A1C: 8.6 % — AB (ref 4.0–5.6)

## 2019-11-10 MED ORDER — LANTUS SOLOSTAR 100 UNIT/ML ~~LOC~~ SOPN: 35.0000 [IU] | PEN_INJECTOR | SUBCUTANEOUS | 99 refills | Status: DC

## 2019-11-10 NOTE — Progress Notes (Signed)
Subjective:    Patient ID: Christopher [name], male    DOB: Dec 31, 1945, 73 y.o.   MRN: [id number]  HPI Pt returns for f/u of diabetes mellitus:  DM type: 1 Dx'ed: 2010.   Complications: polyneuropathy and renal insuff.   Therapy: insulin since 2013.   DKA: never.   Severe hypoglycemia: last episode was in 2018.   Pancreatitis: never.  Other: he is back on QD insulin, after noncompliance with multiple daily injections; he could not take lantus, due to AM hypoglycemia and PM hyperglycemia; however, he changed back after poor results with faster-acting qd insulin; he uses syringe and vial, after difficulty operating pen.  Interval history: I reviewed continuous glucose monitor data.  Glucose varies from 50-400.  It is in general lowest fasting, and highest at mid-day.  pt states he feels well in general. He has missed insulin just once since last ov.  He says glucose if lower in the afternoon, if he is active that day.  He takes just 40 units in am if he is going to be active that day.  On those days, cbg is still lower than on days he is not active, and takes 65 units.   Past Medical History:  Diagnosis Date  . Diabetes mellitus type II   . GERD (gastroesophageal reflux disease)   . Glaucoma   . Hyperlipidemia   . Hypertension     Past Surgical History:  Procedure Laterality Date  . COLONOSCOPY    . HERNIA REPAIR    . [place]    Social History   Socioeconomic History  . Marital status: Married    Spouse name: Not on file  . Number of children: Not on file  . Years of education: Not on file  . Highest education level: Not on file  Occupational History  . Not on file  Social Needs  . Financial resource strain: Not on file  . Food insecurity    Worry: Not on file    Inability: Not on file  . Transportation needs    Medical: Not on file    Non-medical: Not on file  Tobacco Use  . Smoking status: Never Smoker  . Smokeless tobacco: Never Used   Substance and Sexual Activity  . Alcohol use: No  . Drug use: No  . Sexual activity: Not on file  Lifestyle  . Physical activity    Days per week: Not on file    Minutes per session: Not on file  . Stress: Not on file  Relationships  . Social Herbalist on phone: Not on file    Gets together: Not on file    Attends religious service: Not on file    Active member of club or organization: Not on file    Attends meetings of clubs or organizations: Not on file    Relationship status: Not on file  . Intimate partner violence    Fear of current or ex partner: Not on file    Emotionally abused: Not on file    Physically abused: Not on file    Forced sexual activity: Not on file  Other Topics Concern  . Not on file  Social History Narrative  . Not on file    Current Outpatient Medications on File Prior to Visit  Medication Sig Dispense Refill  . Alcohol Swabs (B-D SINGLE USE SWABS REGULAR) PADS USE TO CHECK BLOOD SUGAR 2 TIMES PER DAY 200 each prn  .  ALPHAGAN P 0.1 % SOLN Place 1 drop into both eyes daily.     Marland Kitchen aspirin EC 81 MG EC tablet Take 81 mg by mouth daily.      Marland Kitchen atorvastatin (LIPITOR) 80 MG tablet Take 1 tablet (80 mg total) by mouth daily. 90 tablet 1  . Continuous Blood Gluc Receiver (FREESTYLE LIBRE 14 DAY READER) DEVI 1 Device by Other route See admin instructions. 1 Device 0  . Continuous Blood Gluc Sensor (FREESTYLE LIBRE 14 DAY SENSOR) MISC 1 each by Other route every 14 (fourteen) days. 6 each 0  . dorzolamide-timolol (COSOPT) 22.3-6.8 MG/ML ophthalmic solution Place 1 drop into both eyes 2 (two) times daily.     Marland Kitchen glucagon 1 MG injection Inject 1 mg into the muscle once as needed. 1 each 12  . hydrochlorothiazide (HYDRODIURIL) 25 MG tablet Take 1 tablet (25 mg total) by mouth daily. 90 tablet 1  . Insulin Pen Needle (PEN NEEDLES 5/16") 30G X 8 MM MISC 1 Device by Does not apply route daily. 90 each 3  . lisinopril (ZESTRIL) 2.5 MG tablet Take 1 tablet  (2.5 mg total) by mouth daily. 90 tablet 1  . Multiple Vitamins-Minerals (CENTRUM SILVER ULTRA MENS PO) Take 1 tablet by mouth daily.     . sildenafil (REVATIO) 20 MG tablet Take 1 to 2 tabs 2 - 3 hours before sex 20 tablet 11   No current facility-administered medications on file prior to visit.     No Known Allergies  Family History  Problem Relation Age of Onset  . Alcohol abuse Father   . Diabetes Sister   . Colon cancer Neg Hx   . Esophageal cancer Neg Hx   . Stomach cancer Neg Hx   . Rectal cancer Neg Hx     BP 124/70 (BP Location: Left Arm, Patient Position: Sitting, Cuff Size: Normal)   Pulse 78   Ht 6\' 3"  (1.905 m)   Wt 212 lb 12.8 oz (96.5 kg)   SpO2 98%   BMI 26.60 kg/m    Review of Systems Denies LOC.      Objective:   Physical Exam VITAL SIGNS:  See vs page GENERAL: no distress Pulses: dorsalis pedis intact bilat.   MSK: no deformity of the feet CV: no leg edema Skin:  no ulcer on the feet.  normal color and temp on the feet. Neuro: sensation is intact to touch on the feet  Lab Results  Component Value Date   HGBA1C 8.6 (A) 11/10/2019       Assessment & Plan:  Type 1 DM, with renal insuff: he needs increased rx Hypoglycemia: this limits aggressiveness of glycemic control   Patient Instructions  check your blood sugar twice a day.  vary the time of day when you check, between before the 3 meals, and at bedtime.  also check if you have symptoms of your blood sugar being too high or too low.  please keep a record of the readings and bring it to your next appointment here (or you can bring the meter itself).  You can write it on any piece of paper.  please call us sooner if your blood sugar goes below 70, or if you have a lot of readings over 200.   Please reduce the Lantus to 70 units each morning.   On days you are going to be active, take just 35 units.  To avoid the blood sugar going low in the middles of the night, eat a  light snack at bedtime.    On this type of insulin schedule, you should eat meals on a regular schedule.  If a meal is missed or significantly delayed, your blood sugar could go low.   Please come back for a follow-up appointment in 2 months.

## 2019-11-10 NOTE — Patient Instructions (Addendum)
check your blood sugar twice a day.  vary the time of day when you check, between before the 3 meals, and at bedtime.  also check if you have symptoms of your blood sugar being too high or too low.  please keep a record of the readings and bring it to your next appointment here (or you can bring the meter itself).  You can write it on any piece of paper.  please call us sooner if your blood sugar goes below 70, or if you have a lot of readings over 200.   Please reduce the Lantus to 70 units each morning.   On days you are going to be active, take just 35 units.  To avoid the blood sugar going low in the middles of the night, eat a light snack at bedtime.   On this type of insulin schedule, you should eat meals on a regular schedule.  If a meal is missed or significantly delayed, your blood sugar could go low.   Please come back for a follow-up appointment in 2 months.

## 2019-11-16 ENCOUNTER — Telehealth: Payer: Self-pay | Admitting: Internal Medicine

## 2019-11-16 NOTE — Telephone Encounter (Signed)
Medication Refill - Medication:  ° ° ° °Preferred Pharmacy (with phone number or street name):  ° °Agent: Please be advised that RX refills may take up to 3 business days. We ask that you follow-up with your pharmacy. ° °

## 2019-11-23 ENCOUNTER — Telehealth: Payer: Self-pay | Admitting: Internal Medicine

## 2019-11-23 NOTE — Telephone Encounter (Signed)
[place] Dept of Transportation form to be filled out--placed in dr's folder.  Fax to: [phone].  Also call pt to pick up form at [phone].

## 2019-11-24 ENCOUNTER — Telehealth: Payer: Self-pay

## 2019-11-24 NOTE — Telephone Encounter (Signed)
Forms are missing page 4. This is required to complete the form.  Left message for patient to call back. CRM created.

## 2019-11-24 NOTE — Telephone Encounter (Signed)
See note

## 2019-11-24 NOTE — Telephone Encounter (Signed)
Pt contacted DOT about missing pg 4 and DOT states that they (DOT) said they have pg 4 as he had sent in earlier, pt wants call back to see if he can come pick it up or you can fax it and he will pu the orginals FU  [phone]

## 2019-11-25 NOTE — Telephone Encounter (Signed)
Of course he may pick up. They might refuse them if not completed tho. [facility]

## 2019-11-25 NOTE — Telephone Encounter (Signed)
Spoke with patient. He would like these forms faxed and placed up front for pick up.

## 2019-11-25 NOTE — Telephone Encounter (Signed)
Please advise. DOT already has page 4. Can the patient pick up these forms without page 4?

## 2019-11-25 NOTE — Telephone Encounter (Signed)
Patient picked up form   No charge

## 2019-12-14 ENCOUNTER — Other Ambulatory Visit: Payer: Self-pay | Admitting: Endocrinology

## 2019-12-14 DIAGNOSIS — E108 Type 1 diabetes mellitus with unspecified complications: Secondary | ICD-10-CM

## 2019-12-15 ENCOUNTER — Other Ambulatory Visit: Payer: Self-pay | Admitting: Endocrinology

## 2019-12-15 DIAGNOSIS — E108 Type 1 diabetes mellitus with unspecified complications: Secondary | ICD-10-CM

## 2020-01-10 ENCOUNTER — Other Ambulatory Visit: Payer: Self-pay

## 2020-01-12 ENCOUNTER — Encounter: Payer: Self-pay | Admitting: Endocrinology

## 2020-01-12 ENCOUNTER — Ambulatory Visit (INDEPENDENT_AMBULATORY_CARE_PROVIDER_SITE_OTHER): Payer: Medicare HMO | Admitting: Endocrinology

## 2020-01-12 ENCOUNTER — Other Ambulatory Visit: Payer: Self-pay

## 2020-01-12 VITALS — BP 120/82 | HR 72 | Ht 75.0 in | Wt 212.4 lb

## 2020-01-12 DIAGNOSIS — E108 Type 1 diabetes mellitus with unspecified complications: Secondary | ICD-10-CM | POA: Diagnosis not present

## 2020-01-12 LAB — POCT GLYCOSYLATED HEMOGLOBIN (HGB A1C): Hemoglobin A1C: 8.8 % — AB (ref 4.0–5.6)

## 2020-01-12 MED ORDER — INSULIN DETEMIR 100 UNIT/ML ~~LOC~~ SOLN: 80.0000 [IU] | SUBCUTANEOUS | 11 refills | Status: DC

## 2020-01-12 NOTE — Patient Instructions (Addendum)
check your blood sugar twice a day.  vary the time of day when you check, between before the 3 meals, and at bedtime.  also check if you have symptoms of your blood sugar being too high or too low.  please keep a record of the readings and bring it to your next appointment here (or you can bring the meter itself).  You can write it on any piece of paper.  please call us sooner if your blood sugar goes below 70, or if you have a lot of readings over 200.   When you run out of Lantus, here is a prescription to change to levemir, 80 units each morning.   To avoid the blood sugar going low in the middles of the night, eat a light snack at bedtime.   On this type of insulin schedule, you should eat meals on a regular schedule.  If a meal is missed or significantly delayed, your blood sugar could go low.   Please come back for a follow-up appointment in 3 months.

## 2020-01-12 NOTE — Progress Notes (Signed)
Subjective:    Patient ID: Christopher [name], male    DOB: February 15, 1946, 74 y.o.   MRN: [id number]  HPI Pt returns for f/u of diabetes mellitus:  DM type: 1 Dx'ed: 2010.   Complications: polyneuropathy and renal insuff.   Therapy: insulin since 2013.   DKA: never.   Severe hypoglycemia: last episode was in 2018.   Pancreatitis: never.  SDOH: he uses syringe and vial, after difficulty operating pen; he is back on QD insulin, after noncompliance with multiple daily injections.   Other: he could not take lantus, due to AM hypoglycemia and PM hyperglycemia; however, he changed back after poor results with faster-acting qd insulin.    Interval history: I reviewed continuous glucose monitor data.  Glucose varies from 40-400.  It is in general lowest fasting.  pt states he feels well in general. He says he does not miss any insulin doses.  He takes 70 units qam.  He has 3 boxes to use up.   Past Medical History:  Diagnosis Date  . Diabetes mellitus type II   . GERD (gastroesophageal reflux disease)   . Glaucoma   . Hyperlipidemia   . Hypertension     Past Surgical History:  Procedure Laterality Date  . COLONOSCOPY    . HERNIA REPAIR    . [place]    Social History   Socioeconomic History  . Marital status: Married    Spouse name: Not on file  . Number of children: Not on file  . Years of education: Not on file  . Highest education level: Not on file  Occupational History  . Not on file  Tobacco Use  . Smoking status: Never Smoker  . Smokeless tobacco: Never Used  Substance and Sexual Activity  . Alcohol use: No  . Drug use: No  . Sexual activity: Not on file  Other Topics Concern  . Not on file  Social History Narrative  . Not on file   Social Determinants of Health   Financial Resource Strain:   . Difficulty of Paying Living Expenses: Not on file  Food Insecurity:   . Worried About Charity fundraiser in the Last Year: Not on file  . Ran Out of  Food in the Last Year: Not on file  Transportation Needs:   . Lack of Transportation (Medical): Not on file  . Lack of Transportation (Non-Medical): Not on file  Physical Activity:   . Days of Exercise per Week: Not on file  . Minutes of Exercise per Session: Not on file  Stress:   . Feeling of Stress : Not on file  Social Connections:   . Frequency of Communication with Friends and Family: Not on file  . Frequency of Social Gatherings with Friends and Family: Not on file  . Attends Religious Services: Not on file  . Active Member of Clubs or Organizations: Not on file  . Attends Archivist Meetings: Not on file  . Marital Status: Not on file  Intimate Partner Violence:   . Fear of Current or Ex-Partner: Not on file  . Emotionally Abused: Not on file  . Physically Abused: Not on file  . Sexually Abused: Not on file    Current Outpatient Medications on File Prior to Visit  Medication Sig Dispense Refill  . Alcohol Swabs (B-D SINGLE USE SWABS REGULAR) PADS USE TO CHECK BLOOD SUGAR 2 TIMES PER DAY 200 each prn  . ALPHAGAN P 0.1 % SOLN  Place 1 drop into both eyes daily.     Marland Kitchen aspirin EC 81 MG EC tablet Take 81 mg by mouth daily.      Marland Kitchen atorvastatin (LIPITOR) 80 MG tablet Take 1 tablet (80 mg total) by mouth daily. 90 tablet 1  . Continuous Blood Gluc Receiver (FREESTYLE LIBRE 14 DAY READER) DEVI 1 Device by Other route See admin instructions. 1 Device 0  . Continuous Blood Gluc Sensor (FREESTYLE LIBRE 14 DAY SENSOR) MISC USE 1  EVERY TWO WEEKS 6 each 0  . dorzolamide-timolol (COSOPT) 22.3-6.8 MG/ML ophthalmic solution Place 1 drop into both eyes 2 (two) times daily.     Marland Kitchen glucagon 1 MG injection Inject 1 mg into the muscle once as needed. 1 each 12  . hydrochlorothiazide (HYDRODIURIL) 25 MG tablet Take 1 tablet (25 mg total) by mouth daily. 90 tablet 1  . Insulin Pen Needle (PEN NEEDLES 5/16") 30G X 8 MM MISC 1 Device by Does not apply route daily. 90 each 3  . lisinopril  (ZESTRIL) 2.5 MG tablet Take 1 tablet (2.5 mg total) by mouth daily. 90 tablet 1  . Multiple Vitamins-Minerals (CENTRUM SILVER ULTRA MENS PO) Take 1 tablet by mouth daily.     . sildenafil (REVATIO) 20 MG tablet Take 1 to 2 tabs 2 - 3 hours before sex 20 tablet 11   No current facility-administered medications on file prior to visit.    No Known Allergies  Family History  Problem Relation Age of Onset  . Alcohol abuse Father   . Diabetes Sister   . Colon cancer Neg Hx   . Esophageal cancer Neg Hx   . Stomach cancer Neg Hx   . Rectal cancer Neg Hx     BP 120/82 (BP Location: Right Arm, Patient Position: Sitting, Cuff Size: Normal)   Pulse 72   Ht 6\' 3"  (1.905 m)   Wt 212 lb 6.4 oz (96.3 kg)   SpO2 99%   BMI 26.55 kg/m   Review of Systems Denies LOC.      Objective:   Physical Exam VITAL SIGNS:  See vs page GENERAL: no distress Pulses: dorsalis pedis intact bilat.   MSK: no deformity of the feet CV: no leg edema Skin:  no ulcer on the feet.  normal color and temp on the feet. Neuro: sensation is intact to touch on the feet.     Lab Results  Component Value Date   HGBA1C 8.8 (A) 01/12/2020       Assessment & Plan:  Hypoglycemia, worse.   Type 1 DM, with PN: he would benefit from increased rx, if it can be done with a regimen that avoids or minimizes hypoglycemia. Renal insuff: he needs a faster-acting qam insulin  Patient Instructions  check your blood sugar twice a day.  vary the time of day when you check, between before the 3 meals, and at bedtime.  also check if you have symptoms of your blood sugar being too high or too low.  please keep a record of the readings and bring it to your next appointment here (or you can bring the meter itself).  You can write it on any piece of paper.  please call us sooner if your blood sugar goes below 70, or if you have a lot of readings over 200.   When you run out of Lantus, here is a prescription to change to levemir, 80  units each morning.   To avoid the blood sugar going low  in the middles of the night, eat a light snack at bedtime.   On this type of insulin schedule, you should eat meals on a regular schedule.  If a meal is missed or significantly delayed, your blood sugar could go low.   Please come back for a follow-up appointment in 3 months.

## 2020-01-13 DIAGNOSIS — E161 Other hypoglycemia: Secondary | ICD-10-CM | POA: Diagnosis not present

## 2020-01-13 DIAGNOSIS — R404 Transient alteration of awareness: Secondary | ICD-10-CM | POA: Diagnosis not present

## 2020-01-13 DIAGNOSIS — E162 Hypoglycemia, unspecified: Secondary | ICD-10-CM | POA: Diagnosis not present

## 2020-01-13 DIAGNOSIS — R61 Generalized hyperhidrosis: Secondary | ICD-10-CM | POA: Diagnosis not present

## 2020-01-18 ENCOUNTER — Telehealth: Payer: Self-pay

## 2020-01-18 NOTE — Telephone Encounter (Signed)
Karleen Hampshire (Key: [id number])  Your information has been submitted to [facility]. [employer] will review the request and will issue a decision, typically within 3-7 days from your submission. You can check the updated outcome later by reopening this request.  If [employer] has not responded in 3-7 days or if you have any questions about your ePA request, please contact [employer] at [phone]. If you think there may be a problem with your PA request, use our live chat feature at the bottom right.  For [place] requests, please call [phone].

## 2020-01-18 NOTE — Telephone Encounter (Signed)
APPROVAL  Received notification from [facility] that [place] for Levemir has been approved 01/18/20 through 12/15/20. Documents have been labeled and placed in scan file for HIM and for our future reference.

## 2020-01-18 NOTE — Telephone Encounter (Signed)
PRIOR AUTHORIZATION  PA initiation date: 01/18/20   Insurance Company: [place] completed electronically through [employer] My Meds: Yes  Will await insurance response re: approval/denial.  Karleen Hampshire (Key: [id number]) Rx #: [phone] Levemir 100UNIT/ML solution   Form [employer] Electronic PA Form Created 20 hours ago Sent to Plan 1 minute ago Plan Response 1 minute ago Submit Clinical Questions less than a minute ago Determination Wait for Determination Please wait for [facility] NCPDP 2017 to return a determination.

## 2020-02-10 ENCOUNTER — Ambulatory Visit: Payer: Medicare HMO | Admitting: Internal Medicine

## 2020-02-11 ENCOUNTER — Ambulatory Visit: Payer: Medicare HMO

## 2020-02-15 ENCOUNTER — Ambulatory Visit: Payer: Medicare HMO | Admitting: Family

## 2020-02-18 ENCOUNTER — Ambulatory Visit: Payer: Medicare HMO | Attending: Internal Medicine

## 2020-02-18 DIAGNOSIS — Z23 Encounter for immunization: Secondary | ICD-10-CM

## 2020-02-18 NOTE — Progress Notes (Signed)
   Covid-19 Vaccination Clinic  Name:  Christopher [name]    MRN: [id number] DOB: 1945-12-27  02/18/2020  [name] was observed post Covid-19 immunization for 15 minutes without incident. He was provided with Vaccine Information Sheet and instruction to access the V-Safe system.   [name] was instructed to call 911 with any severe reactions post vaccine: Marland Kitchen Difficulty breathing  . Swelling of face and throat  . A fast heartbeat  . A bad rash all over body  . Dizziness and weakness   Immunizations Administered    Name Date Dose VIS Date Route   Pfizer COVID-19 Vaccine 02/18/2020 12:14 PM 0.3 mL 11/26/2019 Intramuscular   Manufacturer: [place]   Lot: [id number]   [place]: [id number]

## 2020-03-01 ENCOUNTER — Other Ambulatory Visit: Payer: Self-pay | Admitting: Endocrinology

## 2020-03-01 DIAGNOSIS — E108 Type 1 diabetes mellitus with unspecified complications: Secondary | ICD-10-CM

## 2020-03-15 ENCOUNTER — Ambulatory Visit: Payer: Medicare HMO | Attending: Internal Medicine

## 2020-03-15 DIAGNOSIS — Z23 Encounter for immunization: Secondary | ICD-10-CM

## 2020-03-15 NOTE — Progress Notes (Signed)
   Covid-19 Vaccination Clinic  Name:  Christopher [name]    MRN: [id number] DOB: 07/12/1946  03/15/2020  [name] was observed post Covid-19 immunization for 15 minutes without incident. He was provided with Vaccine Information Sheet and instruction to access the V-Safe system.   [name] was instructed to call 911 with any severe reactions post vaccine: Marland Kitchen Difficulty breathing  . Swelling of face and throat  . A fast heartbeat  . A bad rash all over body  . Dizziness and weakness   Immunizations Administered    Name Date Dose VIS Date Route   Pfizer COVID-19 Vaccine 03/15/2020 12:08 PM 0.3 mL 11/26/2019 Intramuscular   Manufacturer: [place]   Lot: [id number]   [place]: [id number]

## 2020-04-10 ENCOUNTER — Other Ambulatory Visit: Payer: Self-pay

## 2020-04-12 ENCOUNTER — Ambulatory Visit (INDEPENDENT_AMBULATORY_CARE_PROVIDER_SITE_OTHER): Payer: Medicare HMO | Admitting: Endocrinology

## 2020-04-12 ENCOUNTER — Other Ambulatory Visit: Payer: Self-pay

## 2020-04-12 ENCOUNTER — Encounter: Payer: Self-pay | Admitting: Endocrinology

## 2020-04-12 VITALS — BP 110/82 | HR 69 | Ht 75.0 in | Wt 211.0 lb

## 2020-04-12 DIAGNOSIS — E108 Type 1 diabetes mellitus with unspecified complications: Secondary | ICD-10-CM

## 2020-04-12 LAB — POCT GLYCOSYLATED HEMOGLOBIN (HGB A1C): Hemoglobin A1C: 8.6 % — AB (ref 4.0–5.6)

## 2020-04-12 MED ORDER — INSULIN DETEMIR 100 UNIT/ML ~~LOC~~ SOLN: 65.0000 [IU] | SUBCUTANEOUS | 11 refills | Status: DC

## 2020-04-12 MED ORDER — INSULIN ASPART 100 UNIT/ML ~~LOC~~ SOLN: 5.0000 [IU] | Freq: Every day | SUBCUTANEOUS | 11 refills | Status: DC

## 2020-04-12 NOTE — Patient Instructions (Addendum)
check your blood sugar twice a day.  vary the time of day when you check, between before the 3 meals, and at bedtime.  also check if you have symptoms of your blood sugar being too high or too low.  please keep a record of the readings and bring it to your next appointment here (or you can bring the meter itself).  You can write it on any piece of paper.  please call us sooner if your blood sugar goes below 70, or if you have a lot of readings over 200.   I have sent a prescription to your pharmacy, to add Novolog 5 units with breakfast, and please reduce levemir to 65 units each morning.     On this type of insulin schedule, you should eat meals on a regular schedule.  If a meal is missed or significantly delayed, your blood sugar could go low.   Please come back for a follow-up appointment in 2-3 months.

## 2020-04-12 NOTE — Progress Notes (Signed)
Subjective:    Patient ID: Christopher [name], male    DOB: 25-Aug-1946, 74 y.o.   MRN: [id number]  HPI Pt returns for f/u of diabetes mellitus:  DM type: 1 is dx'ed based on lean body habitus and insulin dosage.   Dx'ed: 2010.   Complications: PN and CRI.   Therapy: insulin since 2013.   DKA: never.   Severe hypoglycemia: last episode was in 2018.   Pancreatitis: never.  SDOH: he uses syringe and vial, after difficulty operating pen; he is back on QD insulin, after noncompliance with multiple daily injections.   Other: he could not take lantus, due to AM hypoglycemia and PM hyperglycemia; however, he changed back after poor results with faster-acting qd insulin.   Interval history: I reviewed continuous glucose monitor data.  Glucose varies from 40-400.  It is in general highest after breakfast, and lowest in the afternoon.  pt states he feels well in general. He says he does not miss any insulin doses.  He has mild hypoglycemia approx once per week.  This usually happens in the afternoon.   Past Medical History:  Diagnosis Date  . Diabetes mellitus type II   . GERD (gastroesophageal reflux disease)   . Glaucoma   . Hyperlipidemia   . Hypertension     Past Surgical History:  Procedure Laterality Date  . COLONOSCOPY    . HERNIA REPAIR    . [place]    Social History   Socioeconomic History  . Marital status: Married    Spouse name: Not on file  . Number of children: Not on file  . Years of education: Not on file  . Highest education level: Not on file  Occupational History  . Not on file  Tobacco Use  . Smoking status: Never Smoker  . Smokeless tobacco: Never Used  Substance and Sexual Activity  . Alcohol use: No  . Drug use: No  . Sexual activity: Not on file  Other Topics Concern  . Not on file  Social History Narrative  . Not on file   Social Determinants of Health   Financial Resource Strain:   . Difficulty of Paying Living Expenses:    Food Insecurity:   . Worried About Charity fundraiser in the Last Year:   . Arboriculturist in the Last Year:   Transportation Needs:   . Film/video editor (Medical):   Marland Kitchen Lack of Transportation (Non-Medical):   Physical Activity:   . Days of Exercise per Week:   . Minutes of Exercise per Session:   Stress:   . Feeling of Stress :   Social Connections:   . Frequency of Communication with Friends and Family:   . Frequency of Social Gatherings with Friends and Family:   . Attends Religious Services:   . Active Member of Clubs or Organizations:   . Attends Archivist Meetings:   Marland Kitchen Marital Status:   Intimate Partner Violence:   . Fear of Current or Ex-Partner:   . Emotionally Abused:   Marland Kitchen Physically Abused:   . Sexually Abused:     Current Outpatient Medications on File Prior to Visit  Medication Sig Dispense Refill  . Alcohol Swabs (B-D SINGLE USE SWABS REGULAR) PADS USE TO CHECK BLOOD SUGAR 2 TIMES PER DAY 200 each prn  . ALPHAGAN P 0.1 % SOLN Place 1 drop into both eyes daily.     Marland Kitchen aspirin EC 81 MG EC tablet  Take 81 mg by mouth daily.      Marland Kitchen atorvastatin (LIPITOR) 80 MG tablet Take 1 tablet (80 mg total) by mouth daily. 90 tablet 1  . Continuous Blood Gluc Receiver (FREESTYLE LIBRE 14 DAY READER) DEVI 1 Device by Other route See admin instructions. 1 Device 0  . Continuous Blood Gluc Sensor (FREESTYLE LIBRE 14 DAY SENSOR) MISC USE 1  EVERY TWO WEEKS 6 each 0  . dorzolamide-timolol (COSOPT) 22.3-6.8 MG/ML ophthalmic solution Place 1 drop into both eyes 2 (two) times daily.     Marland Kitchen glucagon 1 MG injection Inject 1 mg into the muscle once as needed. 1 each 12  . hydrochlorothiazide (HYDRODIURIL) 25 MG tablet Take 1 tablet (25 mg total) by mouth daily. 90 tablet 1  . Insulin Pen Needle (PEN NEEDLES 5/16") 30G X 8 MM MISC 1 Device by Does not apply route daily. 90 each 3  . lisinopril (ZESTRIL) 2.5 MG tablet Take 1 tablet (2.5 mg total) by mouth daily. 90 tablet 1  .  Multiple Vitamins-Minerals (CENTRUM SILVER ULTRA MENS PO) Take 1 tablet by mouth daily.     . sildenafil (REVATIO) 20 MG tablet Take 1 to 2 tabs 2 - 3 hours before sex 20 tablet 11   No current facility-administered medications on file prior to visit.    No Known Allergies  Family History  Problem Relation Age of Onset  . Alcohol abuse Father   . Diabetes Sister   . Colon cancer Neg Hx   . Esophageal cancer Neg Hx   . Stomach cancer Neg Hx   . Rectal cancer Neg Hx     BP 110/82   Pulse 69   Ht 6\' 3"  (1.905 m)   Wt 211 lb (95.7 kg)   SpO2 98%   BMI 26.37 kg/m    Review of Systems Denies LOC.      Objective:   Physical Exam VITAL SIGNS:  See vs page GENERAL: no distress Pulses: dorsalis pedis intact bilat.   MSK: no deformity of the feet CV: no leg edema Skin:  no ulcer on the feet.  normal color and temp on the feet.  Neuro: sensation is intact to touch on the feet.   Lab Results  Component Value Date   HGBA1C 8.6 (A) 04/12/2020        Assessment & Plan:  Type 1 DM, with CRI.   Hypoglycemia (side effect of insulin): The pattern of his cbg's indicates he needs some adjustment in his therapy.    Patient Instructions  check your blood sugar twice a day.  vary the time of day when you check, between before the 3 meals, and at bedtime.  also check if you have symptoms of your blood sugar being too high or too low.  please keep a record of the readings and bring it to your next appointment here (or you can bring the meter itself).  You can write it on any piece of paper.  please call us sooner if your blood sugar goes below 70, or if you have a lot of readings over 200.   I have sent a prescription to your pharmacy, to add Novolog 5 units with breakfast, and please reduce levemir to 65 units each morning.     On this type of insulin schedule, you should eat meals on a regular schedule.  If a meal is missed or significantly delayed, your blood sugar could go low.    Please come back for a follow-up  appointment in 2-3 months.

## 2020-04-18 ENCOUNTER — Telehealth: Payer: Self-pay | Admitting: Internal Medicine

## 2020-04-18 DIAGNOSIS — K219 Gastro-esophageal reflux disease without esophagitis: Secondary | ICD-10-CM

## 2020-04-18 DIAGNOSIS — E108 Type 1 diabetes mellitus with unspecified complications: Secondary | ICD-10-CM

## 2020-04-18 DIAGNOSIS — E785 Hyperlipidemia, unspecified: Secondary | ICD-10-CM

## 2020-04-18 NOTE — Chronic Care Management (AMB) (Signed)
  Chronic Care Management   Note  04/18/2020 Name: Christopher [name] MRN: [id number] DOB: [birth year]  Christopher [name] is a 74 y.o. year old male who is a primary care patient of Isaac Bliss, Rayford Halsted, MD. I reached out to [name] by phone today in response to a referral sent by Christopher [name] PCP, Isaac Bliss, Rayford Halsted, MD.   Mr. Stfleur was given information about Chronic Care Management services today including:  1. CCM service includes personalized support from designated clinical staff supervised by his physician, including individualized plan of care and coordination with other care providers 2. 24/7 contact phone numbers for assistance for urgent and routine care needs. 3. Service will only be billed when office clinical staff spend 20 minutes or more in a month to coordinate care. 4. Only one practitioner may furnish and bill the service in a calendar month. 5. The patient may stop CCM services at any time (effective at the end of the month) by phone call to the office staff.   Patient did not agree to enrollment in care management services and does not wish to consider at this time.  Follow up plan:   [place]

## 2020-04-18 NOTE — Chronic Care Management (AMB) (Signed)
  Chronic Care Management   Note  04/18/2020 Name: Christopher [name] MRN: [id number] DOB: Jan 28, 1946  Christopher [name] is a 74 y.o. year old male who is a primary care patient of Isaac Bliss, Rayford Halsted, MD. I reached out to [name] by phone today in response to a referral sent by Christopher [name] PCP, Isaac Bliss, Rayford Halsted, MD.   Christopher [name] was given information about Chronic Care Management services today including:  1. CCM service includes personalized support from designated clinical staff supervised by his physician, including individualized plan of care and coordination with other care providers 2. 24/7 contact phone numbers for assistance for urgent and routine care needs. 3. Service will only be billed when office clinical staff spend 20 minutes or more in a month to coordinate care. 4. Only one practitioner may furnish and bill the service in a calendar month. 5. The patient may stop CCM services at any time (effective at the end of the month) by phone call to the office staff.   Patient agreed to services and verbal consent obtained.   Follow up plan:   [place]

## 2020-04-22 ENCOUNTER — Other Ambulatory Visit: Payer: Self-pay | Admitting: Internal Medicine

## 2020-04-22 DIAGNOSIS — Z0000 Encounter for general adult medical examination without abnormal findings: Secondary | ICD-10-CM

## 2020-04-22 DIAGNOSIS — E785 Hyperlipidemia, unspecified: Secondary | ICD-10-CM

## 2020-04-22 DIAGNOSIS — Z23 Encounter for immunization: Secondary | ICD-10-CM

## 2020-05-04 NOTE — Chronic Care Management (AMB) (Signed)
Chronic Care Management Pharmacy  Name: Christopher [name]  MRN: [id number] DOB: 12/05/1946  Chief Complaint/ HPI  Christopher [name],  74 y.o. , male presents for their Initial CCM visit with the clinical pharmacist In office. Patient looks well today without any signs of distress. Patient goes to the [facility] every Tuesdays and Thursdays for Yoga class. He mentions that this really helps him relax. He also does weight lifting and stationary biking at [employer] whenever he can. He does play golf at least once a week. His daughter will come down for this weekend from [place] to visit him since they have not seen each other since 2019. He mentioned that she is vegetarian so he does not worry about what he will be eating. Patient has his lisinopril and hctz bottles today and asks if those are associated with any shaking of the hands. He says that him and his wife noticed that his hands are shaking when watching TV.   PCP : Christopher [name], Christopher Halsted, MD  Their chronic conditions include:   Office Visits: 10/06/19 OV - Annual exam. No changes with meds. Patient is overall doing well.  Consult Visit: 04/12/20 Christopher [name], Endocrinology) - type 1 DM f/u visit. Check blood sugar 2x/day. Added novolog 5 units with breakfast and reduced levemir to 65 units AM. F/u in 2-3 months.  Medications: Outpatient Encounter Medications as of 05/09/2020  Medication Sig Note  . Alcohol Swabs (B-D SINGLE USE SWABS REGULAR) PADS USE TO CHECK BLOOD SUGAR 2 TIMES PER DAY   . ALPHAGAN P 0.1 % SOLN Place 1 drop into both eyes daily.    Marland Kitchen aspirin EC 81 MG EC tablet Take 81 mg by mouth daily.     Marland Kitchen atorvastatin (LIPITOR) 80 MG tablet Take 1 tablet by mouth once daily   . Continuous Blood Gluc Receiver (FREESTYLE LIBRE 14 DAY READER) DEVI 1 Device by Other route See admin instructions.   . Continuous Blood Gluc Sensor (FREESTYLE LIBRE 14 DAY SENSOR) MISC USE 1  EVERY TWO WEEKS   . dorzolamide-timolol (COSOPT) 22.3-6.8 MG/ML  ophthalmic solution Place 1 drop into both eyes 2 (two) times daily.    Marland Kitchen glucagon 1 MG injection Inject 1 mg into the muscle once as needed. 10/20/2019: Doesn't have to use  . hydrochlorothiazide (HYDRODIURIL) 25 MG tablet TAKE 1 TABLET BY MOUTH ONCE DAILY(SCHEDULE ANNUAL OFFICE EXAM)   . insulin aspart (NOVOLOG) 100 UNIT/ML injection Inject 5 Units into the skin daily with breakfast.   . insulin detemir (LEVEMIR) 100 UNIT/ML injection Inject 0.65 mLs (65 Units total) into the skin every morning. And syringes 1/day   . Insulin Pen Needle (PEN NEEDLES 5/16") 30G X 8 MM MISC 1 Device by Does not apply route daily.   Marland Kitchen lisinopril (ZESTRIL) 2.5 MG tablet TAKE 1 TABLET BY MOUTH ONCE DAILY(SCHEULE ANNUAL OFFICE EXAM)   . Multiple Vitamins-Minerals (CENTRUM SILVER ULTRA MENS PO) Take 1 tablet by mouth daily.    . sildenafil (REVATIO) 20 MG tablet Take 1 to 2 tabs 2 - 3 hours before sex (Patient not taking: Reported on 05/10/2020)    No facility-administered encounter medications on file as of 05/09/2020.     Transportation Needs: No Transportation Needs  . Lack of Transportation (Medical): No  . Lack of Transportation (Non-Medical): No     Physical Activity: Sufficiently Active  . Days of Exercise per Week: 5 days  . Minutes of Exercise per Session: 60 min    Current Diagnosis/Assessment:  Goals Addressed            This Visit's Progress   . Chronic Care Management       CARE PLAN ENTRY  Current Barriers:  . Chronic Disease Management support, education, and care coordination needs related to Hypertension, Hyperlipidemia, and Diabetes   Hypertension . Pharmacist Clinical Goal(s): o Over the next 90 days, patient will work with PharmD and providers to maintain BP goal <140/90 . Current regimen:  . Lisinopril 2.5 mg 1 tablet daily . HCTZ 25 mg 1 tablet daily . Interventions: o Discussed the importance of routine blood pressure monitor o Discussed extensively the benefits of  consistent healthy diet and exercise o Counseled on the adverse effects associated with lisinopril and hctz . Patient self care activities - Over the next 90 days, patient will: o Check BP once daily, document, and provide at future appointments o Ensure daily salt intake < 2300 mg/day o [facility] and go [facility] [employer] to do stationary bike and weight lifting  Hyperlipidemia . Pharmacist Clinical Goal(s): o Over the next 90 days, patient will work with PharmD and providers to maintain LDL goal < 100 . Current regimen:  o Atorvastatin 80 mg 1 tablet daily at bedtime . Interventions: o Discussed extensively about consistent diet and exercise . Patient self care activities - Over the next 90 days, patient will: o [facility] and [facility]urs[facility] for [facility] class and go [facility] [employer] to do stationary bike and weight lifting o Continue eating heart healthy diet low in cholesterol  Diabetes . Pharmacist Clinical Goal(s): o Over the next 90 days, patient will work with PharmD and providers to achieve A1c goal <7% . Current regimen:  . Novolog 100 unit/mL 5 units daily with breakfast . Levemir 100 unit/mL 65 units every morning . Glucagon 1 mg injection once as needed . Interventions: o Counseled on having glucose tablets or source of sugar to prevent hypoglycemia episodes when working out o Discussed the benefits of consistent healthy diet and exercise . Patient self care activities - Over the next 90 days, patient will: o Check blood sugar 3-4 times daily, document, and provide at future appointments o Contact provider with any episodes of hypoglycemia o [facility] for [facility] class and go [facility] [employer] to do stationary bike and weight lifting  Medication management . Pharmacist Clinical Goal(s): o Over the next 90 days, patient will work with PharmD  and providers to achieve optimal medication adherence . Current pharmacy: [facility] . Interventions o Comprehensive medication review performed. o Utilize [facility] for medication synchronization, packaging and delivery . Patient self care activities - Over the next 90 days, patient will: o Focus on medication adherence by medication synchronization and delivery o Take medications as prescribed o Report any questions or concerns to PharmD and/or provider(s)  Initial goal documentation       Hypertension   Office blood pressures are  BP Readings from Last 3 Encounters:  04/12/20 110/82  01/12/20 120/82  11/10/19 124/70   Patient has failed these meds in the past: None Patient is currently controlled on the following medications:  . Lisinopril 2.5 mg 1 tablet daily . HCTZ 25 mg 1 tablet daily  Patient does not check blood pressure at home. He doesn't own any blood pressure monitor and advised him to buy the cheapest monitor  he can find to at least have something to check his BP at the house. Patient brought his lisinopril and hctz bottles and asked if those causes tremors or shaking of hands. Advised him that those are not common for this medications and will relay this to concern to Dr. Jerilee Hoh. We also discussed diet and exercise extensively. Encouraged him further that he is doing very well when it comes to his health.  Plan  Continue current medications and control with diet and exercise  Buy a blood pressure monitor to check blood pressure at home at least once daily   Hyperlipidemia   Lipid Panel     Component Value Date/Time   CHOL 148 10/08/2019 0806   TRIG 66.0 10/08/2019 0806   HDL 39.70 10/08/2019 0806   CHOLHDL 4 10/08/2019 0806   VLDL 13.2 10/08/2019 0806   LDLCALC 95 10/08/2019 0806   LDLDIRECT 135.3 06/17/2013 0838     The 10-year ASCVD risk score (Goff DC Jr., et al., 2013) is: 27.1%   Values used to calculate the score:     Age: 64 years      Sex: Male     Is Non-Hispanic African American: Yes     Diabetic: Yes     Tobacco smoker: No     Systolic Blood Pressure: [id number] mmHg     Is BP treated: Yes     HDL Cholesterol: 39.7 mg/dL     Total Cholesterol: 148 mg/dL   Patient has failed these meds in past: simvastatin Patient is currently controlled on the following medications:  . Atorvastatin 80 mg 1 tablet daily at bedtime  Patient is doing well when it comes to diet and exercise. Encouraged him to continue doing what he is doing because his numbers are great. Patient denies myalgias associated with statin.  Plan  Continue current medications and control with diet and exercise  Type 1 Diabetes   Recent Relevant Labs: Lab Results  Component Value Date/Time   HGBA1C 8.6 (A) 04/12/2020 07:34 AM   HGBA1C 8.8 (A) 01/12/2020 07:47 AM   HGBA1C 9.2 (H) 03/25/2017 09:14 AM   HGBA1C 9.1 (H) 01/01/2016 08:57 AM   GFR 61.24 10/08/2019 08:06 AM   GFR 69.33 08/26/2018 10:15 AM   MICROALBUR <0.7 08/26/2018 10:15 AM   MICROALBUR <0.7 03/25/2017 09:14 AM     Checking BG: Daily - CGM  Patient has tried/failed these meds in past: Lantus, Novolin N, Humalog Mix 75/25  Patient is currently controlled on the following medications:  . Novolog 100 unit/mL 5 units daily with breakfast . Levemir 100 unit/mL 65 units every morning . Glucagon 1 mg injection once as needed  Last diabetic Eye exam:  Lab Results  Component Value Date/Time   HMDIABEYEEXA No Retinopathy 09/14/2019 12:00 AM    Last diabetic Foot exam:  Lab Results  Component Value Date/Time   HMDIABFOOTEX done 09/13/2009 12:00 AM    Patient uses continuous glucose monitoring and he monitors at least 3 times a day. He has no concerns with his insulins for now. Patient has been great with his diet and exercise and encouraged him to stay with it since his A1c is slowly dropping. Advised to have glucose tablets in case of hypoglycemia event during workouts and check his blood  sugar pre and post workout sessions. Patient has yearly eye and foot exam.  Plan  Continue current medications and control with diet and exercise  Glaucoma   Patient has failed these meds in past: None  Patient is currently controlled on the following medications:   Dorzolamide-timolol 22.3-6.8 mg/ml opth soln 1 drop into both eyes BID  Alphagan P 0.1% opth soln 1 drop into both eyes daily  Patient has yearly eye exam. No present concerns with his glaucoma.  Plan  Continue current medications   OTCs/Health Maintenance   Patient is currently controlled on the following medications: . Freestyle libre and sensors . Aspirin EC 81 mg 1 tablet daily . Centrum Silver Ultra Mens 1 tablet daily . Sildenafil 20 mg 1-2 tablets 2-3 hrs before sex  Plan  Continue current medications  Vaccines   Reviewed and discussed patient's vaccination history.    Immunization History  Administered Date(s) Administered  . Fluad Quad(high Dose 65+) 10/06/2019  . Influenza Whole 09/13/2009, 01/01/2011  . Influenza, High Dose Seasonal PF 12/12/2014, 08/22/2015, 09/17/2016, 10/27/2017, 08/26/2018  . Influenza,inj,Quad PF,6+ Mos 08/25/2013  . PFIZER SARS-COV-2 Vaccination 02/18/2020, 03/15/2020  . Pneumococcal Conjugate-13 01/08/2016  . Pneumococcal Polysaccharide-23 09/13/2009, 03/31/2017  . Td 05/08/2007, 08/26/2018    Plan  Recommended patient receive shingles vaccine in the office/pharmacy.   Medication Management   Pharmacy/Benefits: [facility] / [facility] Pt endorses 100% compliance  We discussed trial with [facility] to see if it benefits him with managing his prescriptions with my help.  Plan  Utilize [facility] for medication synchronization, packaging and delivery   Verbal consent obtained for [facility] enhanced pharmacy services (medication synchronization, adherence packaging, delivery coordination). A medication sync plan was created to allow patient to  get all medications delivered once every 30 to 90 days per patient preference. Patient understands they have freedom to choose pharmacy and clinical pharmacist will coordinate care between all prescribers and [facility].   Follow up: 3 month office visit  Geraldine Contras, PharmD Clinical Pharmacist [facility] [place] [phone]

## 2020-05-04 NOTE — Addendum Note (Signed)
Addended by: Westley Hummer B on: 05/04/2020 03:19 PM   Modules accepted: Orders

## 2020-05-09 ENCOUNTER — Other Ambulatory Visit: Payer: Self-pay

## 2020-05-09 ENCOUNTER — Ambulatory Visit: Payer: Medicare HMO | Admitting: Pharmacist

## 2020-05-09 DIAGNOSIS — E785 Hyperlipidemia, unspecified: Secondary | ICD-10-CM

## 2020-05-09 DIAGNOSIS — E108 Type 1 diabetes mellitus with unspecified complications: Secondary | ICD-10-CM

## 2020-05-10 NOTE — Patient Instructions (Addendum)
Visit Information  It was a pleasure meeting with you today, Christopher [name]! I am encouraged with your current health and progress. I hope that you are encouraged too! I look forward to helping you maintain your overall health and just enjoy playing golf and enjoy time with your family and friends. If you have questions or concerns, you have my number. Please do not hesitate to call. Enjoy your with your daughter and I look forward to seeing your progress in the next 3 months again. Thank you for meeting with me to discuss your medications! I look forward to working with you to achieve your health care goals. Below is a summary of what we talked about during the visit:  Goals Addressed            This Visit's Progress   . Chronic Care Management       CARE PLAN ENTRY  Current Barriers:  . Chronic Disease Management support, education, and care coordination needs related to Hypertension, Hyperlipidemia, and Diabetes   Hypertension . Pharmacist Clinical Goal(s): o Over the next 90 days, patient will work with PharmD and providers to maintain BP goal <140/90 . Current regimen:  . Lisinopril 2.5 mg 1 tablet daily . HCTZ 25 mg 1 tablet daily . Interventions: o Discussed the importance of routine blood pressure monitor o Discussed extensively the benefits of consistent healthy diet and exercise o Counseled on the adverse effects associated with lisinopril and hctz . Patient self care activities - Over the next 90 days, patient will: o Check BP once daily, document, and provide at future appointments o Ensure daily salt intake < 2300 mg/day o [facility] and [facility] [employer] to do stationary bike and weight lifting  Hyperlipidemia . Pharmacist Clinical Goal(s): o Over the next 90 days, patient will work with PharmD and providers to maintain LDL goal < 100 . Current regimen:  o Atorvastatin 80 mg 1 tablet daily at  bedtime . Interventions: o Discussed extensively about consistent diet and exercise . Patient self care activities - Over the next 90 days, patient will: o [facility] and [facility] [employer] to do stationary bike and weight lifting o Continue eating heart healthy diet low in cholesterol  Diabetes . Pharmacist Clinical Goal(s): o Over the next 90 days, patient will work with PharmD and providers to achieve A1c goal <7% . Current regimen:  . Novolog 100 unit/mL 5 units daily with breakfast . Levemir 100 unit/mL 65 units every morning . Glucagon 1 mg injection once as needed . Interventions: o Counseled on having glucose tablets or source of sugar to prevent hypoglycemia episodes when working out o Discussed the benefits of consistent healthy diet and exercise . Patient self care activities - Over the next 90 days, patient will: o Check blood sugar 3-4 times daily, document, and provide at future appointments o Contact provider with any episodes of hypoglycemia o [facility] and Thurs[facility] for y[facility] class and [facility] [employer] to do stationary bike and weight lifting  Medication management . Pharmacist Clinical Goal(s): o Over the next 90 days, patient will work with PharmD and providers to achieve optimal medication adherence . Current pharmacy: [facility] . Interventions o Comprehensive medication review performed. o Utilize [facility] for medication synchronization, packaging and delivery . Patient self care activities - Over  the next 90 days, patient will: o Focus on medication adherence by medication synchronization and delivery o Take medications as prescribed o Report any questions or concerns to PharmD and/or provider(s)  Initial goal documentation        Christopher [name] was given information about Chronic Care Management services today including:  1. CCM service includes  personalized support from designated clinical staff supervised by his physician, including individualized plan of care and coordination with other care providers 2. 24/7 contact phone numbers for assistance for urgent and routine care needs. 3. Standard insurance, coinsurance, copays and deductibles apply for chronic care management only during months in which we provide at least 20 minutes of these services. Most insurances cover these services at 100%, however patients may be responsible for any copay, coinsurance and/or deductible if applicable. This service may help you avoid the need for more expensive face-to-face services. 4. Only one practitioner may furnish and bill the service in a calendar month. 5. The patient may stop CCM services at any time (effective at the end of the month) by phone call to the office staff.  Patient agreed to services and verbal consent obtained.   The patient verbalized understanding of instructions provided today and agreed to receive a mailed copy of patient instruction and/or educational materials. Face to Face appointment with pharmacist scheduled for: 08/09/20 at 4 PM . Geraldine Contras, PharmD Clinical Pharmacist [facility] [phone]   Diabetes Mellitus and Exercise Exercising regularly is important for your overall health, especially when you have diabetes (diabetes mellitus). Exercising is not only about losing weight. It has many other health benefits, such as increasing muscle strength and bone density and reducing body fat and stress. This leads to improved fitness, flexibility, and endurance, all of which result in better overall health. Exercise has additional benefits for people with diabetes, including:  Reducing appetite.  Helping to lower and control blood glucose.  Lowering blood pressure.  Helping to control amounts of fatty substances (lipids) in the blood, such as cholesterol and triglycerides.  Helping the  body to respond better to insulin (improving insulin sensitivity).  Reducing how much insulin the body needs.  Decreasing the risk for heart disease by: ? Lowering cholesterol and triglyceride levels. ? Increasing the levels of good cholesterol. ? Lowering blood glucose levels. What is my activity plan? Your health care provider or certified diabetes educator can help you make a plan for the type and frequency of exercise (activity plan) that works for you. Make sure that you:  Do at least 150 minutes of moderate-intensity or vigorous-intensity exercise each week. This could be brisk walking, biking, or water aerobics. ? Do stretching and strength exercises, such as yoga or weightlifting, at least 2 times a week. ? Spread out your activity over at least 3 days of the week.  Get some form of physical activity every day. ? Do not go more than 2 days in a row without some kind of physical activity. ? Avoid being inactive for more than 30 minutes at a time. Take frequent breaks to walk or stretch.  Choose a type of exercise or activity that you enjoy, and set realistic goals.  Start slowly, and gradually increase the intensity of your exercise over time. What do I need to know about managing my diabetes?   Check your blood glucose before and after exercising. ? If your blood glucose is 240 mg/dL (13.3 mmol/L) or higher before you exercise, check your urine for ketones.  If you have ketones in your urine, do not exercise until your blood glucose returns to normal. ? If your blood glucose is 100 mg/dL (5.6 mmol/L) or lower, eat a snack containing 15-20 grams of carbohydrate. Check your blood glucose 15 minutes after the snack to make sure that your level is above 100 mg/dL (5.6 mmol/L) before you start your exercise.  Know the symptoms of low blood glucose (hypoglycemia) and how to treat it. Your risk for hypoglycemia increases during and after exercise. Common symptoms of hypoglycemia can  include: ? Hunger. ? Anxiety. ? Sweating and feeling clammy. ? Confusion. ? Dizziness or feeling light-headed. ? Increased heart rate or palpitations. ? Blurry vision. ? Tingling or numbness around the mouth, lips, or tongue. ? Tremors or shakes. ? Irritability.  Keep a rapid-acting carbohydrate snack available before, during, and after exercise to help prevent or treat hypoglycemia.  Avoid injecting insulin into areas of the body that are going to be exercised. For example, avoid injecting insulin into: ? The arms, when playing tennis. ? The legs, when jogging.  Keep records of your exercise habits. Doing this can help you and your health care provider adjust your diabetes management plan as needed. Write down: ? Food that you eat before and after you exercise. ? Blood glucose levels before and after you exercise. ? The type and amount of exercise you have done. ? When your insulin is expected to peak, if you use insulin. Avoid exercising at times when your insulin is peaking.  When you start a new exercise or activity, work with your health care provider to make sure the activity is safe for you, and to adjust your insulin, medicines, or food intake as needed.  Drink plenty of water while you exercise to prevent dehydration or heat stroke. Drink enough fluid to keep your urine clear or pale yellow. Summary  Exercising regularly is important for your overall health, especially when you have diabetes (diabetes mellitus).  Exercising has many health benefits, such as increasing muscle strength and bone density and reducing body fat and stress.  Your health care provider or certified diabetes educator can help you make a plan for the type and frequency of exercise (activity plan) that works for you.  When you start a new exercise or activity, work with your health care provider to make sure the activity is safe for you, and to adjust your insulin, medicines, or food intake as  needed. This information is not intended to replace advice given to you by your health care provider. Make sure you discuss any questions you have with your health care provider. Document Revised: 06/26/2017 Document Reviewed: 05/13/2016 Elsevier Patient Education  [place].

## 2020-05-19 ENCOUNTER — Telehealth: Payer: Self-pay | Admitting: *Deleted

## 2020-05-19 DIAGNOSIS — Z23 Encounter for immunization: Secondary | ICD-10-CM

## 2020-05-19 MED ORDER — LISINOPRIL 2.5 MG PO TABS: ORAL_TABLET | ORAL | 1 refills | Status: DC

## 2020-05-19 NOTE — Telephone Encounter (Signed)
-----   Message from Eli Hose, [facility] sent at 05/19/2020  7:53 AM EDT ----- Regarding: Request for new rx Hey Ms. Apolonio Schneiders, good morning! I would like to see if you could send in a new rx for Mr. Trevar to [facility] for his lisinopril. We tried having [facility] transferred it, but for some reason they sent to others ones, but not that. Thank you so much.  Noel A.

## 2020-05-23 ENCOUNTER — Telehealth: Payer: Self-pay | Admitting: Pharmacist

## 2020-05-23 ENCOUNTER — Other Ambulatory Visit: Payer: Self-pay | Admitting: Endocrinology

## 2020-05-23 DIAGNOSIS — E108 Type 1 diabetes mellitus with unspecified complications: Secondary | ICD-10-CM

## 2020-05-23 MED ORDER — FREESTYLE LIBRE 14 DAY SENSOR MISC: 1 refills | Status: DC

## 2020-05-23 NOTE — Telephone Encounter (Signed)
Patient requested a new rx for his freestyle libre sensors to be delivered on 05/24/20. Spoke with [facility] to request a new rx.

## 2020-05-23 NOTE — Telephone Encounter (Signed)
RX sent

## 2020-05-23 NOTE — Telephone Encounter (Signed)
Ena Dawley -Pharmacist at [facility] called to request the following RX (patient is completely out):  Medication Refill Request  Did you call your pharmacy and request this refill first? Yes . If patient has not contacted pharmacy first, instruct them to do so for future refills.  . Remind them that contacting the pharmacy for their refill is the quickest method to get the refill.  . Refill policy also stated that it will take anywhere between 24-72 hours to receive the refill.    Name of medication?   Continuous Blood Gluc Sensor (FREESTYLE LIBRE 14 DAY SENSOR) MISC  Is this a 90 day supply? If possible  Name and location of pharmacy?  [facility] - [place], [place] - [address] Phone:  [phone]  Fax:  [phone]       . Is the request for diabetes test strips? No . If yes, what brand? N/A

## 2020-05-24 ENCOUNTER — Telehealth: Payer: Self-pay | Admitting: *Deleted

## 2020-05-24 ENCOUNTER — Other Ambulatory Visit: Payer: Self-pay | Admitting: Endocrinology

## 2020-05-24 DIAGNOSIS — Z0000 Encounter for general adult medical examination without abnormal findings: Secondary | ICD-10-CM

## 2020-05-24 DIAGNOSIS — E785 Hyperlipidemia, unspecified: Secondary | ICD-10-CM

## 2020-05-24 MED ORDER — "INSULIN SYRINGE 27G X 1/2"" 1 ML MISC": 2.0000 | Freq: Every day | 3 refills | Status: DC

## 2020-05-24 MED ORDER — HYDROCHLOROTHIAZIDE 25 MG PO TABS: ORAL_TABLET | ORAL | 0 refills | Status: DC

## 2020-05-24 MED ORDER — ATORVASTATIN CALCIUM 80 MG PO TABS: 80.0000 mg | ORAL_TABLET | Freq: Every day | ORAL | 0 refills | Status: DC

## 2020-05-24 NOTE — Telephone Encounter (Signed)
Message forwarded to PCP as Rxs for Alphagan and Dorzol/Timolol as I did not see where these were prescribed by Dr Jerilee Hoh.

## 2020-05-24 NOTE — Telephone Encounter (Signed)
Those eye drops should be coming from an eye doctor. Ok to send HCTZ and atorvastatin.

## 2020-05-24 NOTE — Telephone Encounter (Signed)
Rxs sent for HCTZ and Atorvastatin and message forwarded back to the pharmacist.

## 2020-05-24 NOTE — Telephone Encounter (Signed)
-----   Message from Eli Hose, [facility] sent at 05/24/2020 10:41 AM EDT ----- Ms. Christopher [name], I would need new rx for  HCTZ 25 mg, Alphagan P 0.1%, Dorzol/Timolol 22.3-6.8 mg/ml, & Atorvastatin 80 mg sent to [facility]. It's getting difficult for me to get prescription transfers.  Thank you so much I really appreciate it.  Wynelle Beckmann ----- Message ----- From: Agnes Lawrence, CMA Sent: 05/22/2020   3:15 PM EDT To: Eli Hose, RPH  Hello, I am helping in Christopher [name]'s basket as she is out of the office this week.  You may want to contact Dr Loanne Drilling as he is listed as the pts endo and gave him the Rx for the sensors.   Take care! Christopher [name]  ----- Message ----- From: Eli Hose, [facility] Sent: 05/22/2020   2:57 PM EDT To: Lamarr Lulas, CMA  Hello, Ms. Christopher [name]. Good afternoon. I am trying to reach out to see if you could send a new rx for Christopher [name] sensors to [facility]. He will run out this week and we would like to send it tomorrow for delivery alongside his other meds. Thank you so much!  Christopher [name]

## 2020-05-26 ENCOUNTER — Telehealth: Payer: Self-pay | Admitting: Endocrinology

## 2020-05-26 ENCOUNTER — Other Ambulatory Visit: Payer: Self-pay

## 2020-05-26 DIAGNOSIS — E108 Type 1 diabetes mellitus with unspecified complications: Secondary | ICD-10-CM

## 2020-05-26 MED ORDER — "INSULIN SYRINGE 27G X 1/2"" 1 ML MISC": 1.0000 | Freq: Two times a day (BID) | 1 refills | Status: DC

## 2020-05-26 NOTE — Telephone Encounter (Signed)
Medication Refill Request  Did you call your pharmacy and request this refill first? Yes  . If patient has not contacted pharmacy first, instruct them to do so for future refills.  . Remind them that contacting the pharmacy for their refill is the quickest method to get the refill.  . Refill policy also stated that it will take anywhere between 24-72 hours to receive the refill.    Name of medication? Insulin syringes 27G x 1/2"  Is this a 90 day supply? "either 90 or 100"  Name and location of pharmacy?   [facility] [address] [place], [place] [address] Phone: [phone]

## 2020-05-26 NOTE — Telephone Encounter (Signed)
Rx was sent on 05/24/20 to [facility]. Rx has now been re-sent as pt requested.

## 2020-05-29 ENCOUNTER — Telehealth: Payer: Self-pay | Admitting: Endocrinology

## 2020-05-29 NOTE — Telephone Encounter (Signed)
Patient came into [facility] on 05/29/2020 and requested that his pharmacy of record be changed back to [facility] on [address]. He clearly stated that he does not want any prescriptions sent to [facility].

## 2020-05-29 NOTE — Telephone Encounter (Signed)
Noted  

## 2020-05-30 ENCOUNTER — Other Ambulatory Visit: Payer: Self-pay

## 2020-05-30 MED ORDER — "INSULIN SYRINGE 31G X 5/16"" 1 ML MISC": 1 refills | Status: DC

## 2020-06-07 ENCOUNTER — Other Ambulatory Visit: Payer: Self-pay

## 2020-06-08 ENCOUNTER — Encounter: Payer: Self-pay | Admitting: Internal Medicine

## 2020-06-08 ENCOUNTER — Ambulatory Visit (INDEPENDENT_AMBULATORY_CARE_PROVIDER_SITE_OTHER): Payer: Medicare HMO | Admitting: Internal Medicine

## 2020-06-08 VITALS — BP 110/70 | HR 60 | Temp 97.2°F | Wt 207.2 lb

## 2020-06-08 DIAGNOSIS — E785 Hyperlipidemia, unspecified: Secondary | ICD-10-CM

## 2020-06-08 DIAGNOSIS — H409 Unspecified glaucoma: Secondary | ICD-10-CM

## 2020-06-08 DIAGNOSIS — E108 Type 1 diabetes mellitus with unspecified complications: Secondary | ICD-10-CM

## 2020-06-08 DIAGNOSIS — K219 Gastro-esophageal reflux disease without esophagitis: Secondary | ICD-10-CM | POA: Diagnosis not present

## 2020-06-08 MED ORDER — EZETIMIBE 10 MG PO TABS: 10.0000 mg | ORAL_TABLET | Freq: Every day | ORAL | 1 refills | Status: DC

## 2020-06-08 NOTE — Patient Instructions (Signed)
-  Nice seeing you today!!  -Start Zetia 10 mg daily for cholesterol.  -See you back in October for your physical/wellness visit.

## 2020-06-08 NOTE — Progress Notes (Signed)
Established Patient Office Visit     This visit occurred during the SARS-CoV-2 public health emergency.  Safety protocols were in place, including screening questions prior to the visit, additional usage of staff PPE, and extensive cleaning of exam room while observing appropriate contact time as indicated for disinfecting solutions.    CC/Reason for Visit: Follow-up chronic conditions  HPI: Christopher [name] is a 74 y.o. male who is coming in today for the above mentioned reasons. Past Medical History is significant for: Well-controlled hypertension, glaucoma, labile insulin-dependent diabetes followed by endocrinology, Dr. Loanne Drilling. Hyperlipidemia that has not been well controlled.  He has no acute complaints today.  Since I last saw him, he received both of his Covid vaccines   Past Medical/Surgical History: Past Medical History:  Diagnosis Date  . Diabetes mellitus type II   . GERD (gastroesophageal reflux disease)   . Glaucoma   . Hyperlipidemia   . Hypertension     Past Surgical History:  Procedure Laterality Date  . COLONOSCOPY    . HERNIA REPAIR    . [place]    Social History:  reports that he has never smoked. He has never used smokeless tobacco. He reports that he does not drink alcohol and does not use drugs.  Allergies: No Known Allergies  Family History:  Family History  Problem Relation Age of Onset  . Alcohol abuse Father   . Diabetes Sister   . Colon cancer Neg Hx   . Esophageal cancer Neg Hx   . Stomach cancer Neg Hx   . Rectal cancer Neg Hx      Current Outpatient Medications:  .  Alcohol Swabs (B-D SINGLE USE SWABS REGULAR) PADS, USE TO CHECK BLOOD SUGAR 2 TIMES PER DAY, Disp: 200 each, Rfl: prn .  ALPHAGAN P 0.1 % SOLN, Place 1 drop into both eyes daily. , Disp: , Rfl:  .  aspirin EC 81 MG EC tablet, Take 81 mg by mouth daily.  , Disp: , Rfl:  .  atorvastatin (LIPITOR) 80 MG tablet, Take 1 tablet (80 mg total) by  mouth daily., Disp: 90 tablet, Rfl: 0 .  Continuous Blood Gluc Receiver (FREESTYLE LIBRE 14 DAY READER) DEVI, 1 Device by Other route See admin instructions., Disp: 1 Device, Rfl: 0 .  Continuous Blood Gluc Sensor (FREESTYLE LIBRE 14 DAY SENSOR) MISC, USE 1  EVERY TWO WEEKS, Disp: 6 each, Rfl: 1 .  dorzolamide-timolol (COSOPT) 22.3-6.8 MG/ML ophthalmic solution, Place 1 drop into both eyes 2 (two) times daily. , Disp: , Rfl:  .  glucagon 1 MG injection, Inject 1 mg into the muscle once as needed., Disp: 1 each, Rfl: 12 .  hydrochlorothiazide (HYDRODIURIL) 25 MG tablet, TAKE 1 TABLET BY MOUTH ONCE DAILY(SCHEDULE ANNUAL OFFICE EXAM), Disp: 90 tablet, Rfl: 0 .  insulin aspart (NOVOLOG) 100 UNIT/ML injection, Inject 5 Units into the skin daily with breakfast., Disp: 10 mL, Rfl: 11 .  insulin detemir (LEVEMIR) 100 UNIT/ML injection, Inject 0.65 mLs (65 Units total) into the skin every morning. And syringes 1/day, Disp: 30 mL, Rfl: 11 .  Insulin Syringe-Needle U-100 (INSULIN SYRINGE 1CC/31GX5/16") 31G X 5/16" 1 ML MISC, Use to inject insulin 2 times a day., Disp: 100 each, Rfl: 1 .  lisinopril (ZESTRIL) 2.5 MG tablet, TAKE 1 TABLET BY MOUTH ONCE DAILY(SCHEULE ANNUAL OFFICE EXAM), Disp: 90 tablet, Rfl: 1 .  Multiple Vitamins-Minerals (CENTRUM SILVER ULTRA MENS PO), Take 1 tablet by mouth daily. , Disp: ,  Rfl:  .  sildenafil (REVATIO) 20 MG tablet, Take 1 to 2 tabs 2 - 3 hours before sex, Disp: 20 tablet, Rfl: 11 .  ezetimibe (ZETIA) 10 MG tablet, Take 1 tablet (10 mg total) by mouth daily., Disp: 90 tablet, Rfl: 1  Review of Systems:  Constitutional: Denies fever, chills, diaphoresis, appetite change and fatigue.  HEENT: Denies photophobia, eye pain, redness, hearing loss, ear pain, congestion, sore throat, rhinorrhea, sneezing, mouth sores, trouble swallowing, neck pain, neck stiffness and tinnitus.   Respiratory: Denies SOB, DOE, cough, chest tightness,  and wheezing.   Cardiovascular: Denies chest  pain, palpitations and leg swelling.  Gastrointestinal: Denies nausea, vomiting, abdominal pain, diarrhea, constipation, blood in stool and abdominal distention.  Genitourinary: Denies dysuria, urgency, frequency, hematuria, flank pain and difficulty urinating.  Endocrine: Denies: hot or cold intolerance, sweats, changes in hair or nails, polyuria, polydipsia. Musculoskeletal: Denies myalgias, back pain, joint swelling, arthralgias and gait problem.  Skin: Denies pallor, rash and wound.  Neurological: Denies dizziness, seizures, syncope, weakness, light-headedness, numbness and headaches.  Hematological: Denies adenopathy. Easy bruising, personal or family bleeding history  Psychiatric/Behavioral: Denies suicidal ideation, mood changes, confusion, nervousness, sleep disturbance and agitation    Physical Exam: Vitals:   06/08/20 0736  BP: 110/70  Pulse: 60  Temp: (!) 97.2 F (36.2 C)  TempSrc: Temporal  SpO2: 98%  Weight: 207 lb 3.2 oz (94 kg)    Body mass index is 25.9 kg/m.   Constitutional: NAD, calm, comfortable Eyes: PERRL, lids and conjunctivae normal ENMT: Mucous membranes are moist.  Tympanic membrane is pearly white, no erythema or bulging. Respiratory: clear to auscultation bilaterally, no wheezing, no crackles. Normal respiratory effort. No accessory muscle use.  Cardiovascular: Regular rate and rhythm, no murmurs / rubs / gallops. No extremity edema.  Neurologic: Grossly intact and nonfocal Psychiatric: Normal judgment and insight. Alert and oriented x 3. Normal mood.    Impression and Plan:  Hyperlipidemia, unspecified hyperlipidemia type  -Goal LDL is less than 70, last LDL was 95 in October 2020. -He is already on maximum dose atorvastatin, add Zetia 10 mg daily.  Type 1 diabetes mellitus with unspecified complications (HCC) -Labile, followed by endocrinology with an A1c of 8.6 in April.  Glaucoma, unspecified glaucoma type, unspecified  laterality -Followed by ophthalmology  Gastroesophageal reflux disease without esophagitis -Well-controlled, not on daily PPI therapy.   Patient Instructions  -Nice seeing you today!!  -Start Zetia 10 mg daily for cholesterol.  -See you back in October for your physical/wellness visit.     Lelon Frohlich, MD [facility] at [facility]

## 2020-06-12 ENCOUNTER — Other Ambulatory Visit: Payer: Self-pay

## 2020-06-12 DIAGNOSIS — H52221 Regular astigmatism, right eye: Secondary | ICD-10-CM | POA: Diagnosis not present

## 2020-06-12 DIAGNOSIS — H5201 Hypermetropia, right eye: Secondary | ICD-10-CM | POA: Diagnosis not present

## 2020-06-12 DIAGNOSIS — E108 Type 1 diabetes mellitus with unspecified complications: Secondary | ICD-10-CM

## 2020-06-12 DIAGNOSIS — H524 Presbyopia: Secondary | ICD-10-CM | POA: Diagnosis not present

## 2020-06-12 DIAGNOSIS — H401111 Primary open-angle glaucoma, right eye, mild stage: Secondary | ICD-10-CM | POA: Diagnosis not present

## 2020-06-12 MED ORDER — "INSULIN SYRINGE 29G X 1/2"" 0.5 ML MISC": 1.0000 | Freq: Every day | 0 refills | Status: DC

## 2020-06-13 ENCOUNTER — Other Ambulatory Visit: Payer: Self-pay

## 2020-06-14 ENCOUNTER — Encounter: Payer: Self-pay | Admitting: Endocrinology

## 2020-06-14 ENCOUNTER — Ambulatory Visit (INDEPENDENT_AMBULATORY_CARE_PROVIDER_SITE_OTHER): Payer: Medicare HMO | Admitting: Endocrinology

## 2020-06-14 VITALS — BP 132/80 | HR 72 | Ht 75.0 in | Wt 205.8 lb

## 2020-06-14 DIAGNOSIS — E108 Type 1 diabetes mellitus with unspecified complications: Secondary | ICD-10-CM | POA: Diagnosis not present

## 2020-06-14 LAB — POCT GLYCOSYLATED HEMOGLOBIN (HGB A1C): Hemoglobin A1C: 8.8 % — AB (ref 4.0–5.6)

## 2020-06-14 MED ORDER — FREESTYLE LIBRE 14 DAY SENSOR MISC: 1.0000 | 3 refills | Status: DC

## 2020-06-14 NOTE — Progress Notes (Signed)
Subjective:    Patient ID: Christopher [name], male    DOB: 1946/05/05, 74 y.o.   MRN: [id number]  HPI Pt returns for f/u of diabetes mellitus:  DM type: Insulin-requiring type 2 (but he may be evolving type 1).  Dx'ed: 2010.   Complications: PN and CRI.   Therapy: insulin since 2013.   DKA: never.   Severe hypoglycemia: last episode was in 2018.   Pancreatitis: never.  SDOH: he uses syringe and vial, after difficulty operating pen; he is back on QD insulin, after noncompliance with multiple daily injections.   Other: he could not take lantus, due to AM hypoglycemia and PM hyperglycemia; however, he changed back after poor results with faster-acting qd insulin.   Interval history: I reviewed continuous glucose monitor data.  Glucose varies from 40-360.  It is in general highest after breakfast, and lowest in the afternoon.  pt states he feels well in general. He says he does not miss any insulin doses.  He has mild hypoglycemia approx once per week.  This usually happens in the afternoon.   Past Medical History:  Diagnosis Date  . Diabetes mellitus type II   . GERD (gastroesophageal reflux disease)   . Glaucoma   . Hyperlipidemia   . Hypertension     Past Surgical History:  Procedure Laterality Date  . COLONOSCOPY    . HERNIA REPAIR    . [place]    Social History   Socioeconomic History  . Marital status: Married    Spouse name: Not on file  . Number of children: Not on file  . Years of education: Not on file  . Highest education level: Not on file  Occupational History  . Not on file  Tobacco Use  . Smoking status: Never Smoker  . Smokeless tobacco: Never Used  Vaping Use  . Vaping Use: Never used  Substance and Sexual Activity  . Alcohol use: No  . Drug use: No  . Sexual activity: Not on file  Other Topics Concern  . Not on file  Social History Narrative  . Not on file   Social Determinants of Health   Financial Resource Strain:   .  Difficulty of Paying Living Expenses:   Food Insecurity:   . Worried About Charity fundraiser in the Last Year:   . Arboriculturist in the Last Year:   Transportation Needs: No Transportation Needs  . Lack of Transportation (Medical): No  . Lack of Transportation (Non-Medical): No  Physical Activity: Sufficiently Active  . Days of Exercise per Week: 5 days  . Minutes of Exercise per Session: 60 min  Stress:   . Feeling of Stress :   Social Connections:   . Frequency of Communication with Friends and Family:   . Frequency of Social Gatherings with Friends and Family:   . Attends Religious Services:   . Active Member of Clubs or Organizations:   . Attends Archivist Meetings:   Marland Kitchen Marital Status:   Intimate Partner Violence:   . Fear of Current or Ex-Partner:   . Emotionally Abused:   Marland Kitchen Physically Abused:   . Sexually Abused:     Current Outpatient Medications on File Prior to Visit  Medication Sig Dispense Refill  . Alcohol Swabs (B-D SINGLE USE SWABS REGULAR) PADS USE TO CHECK BLOOD SUGAR 2 TIMES PER DAY 200 each prn  . ALPHAGAN P 0.1 % SOLN Place 1 drop into both eyes  daily.     . aspirin EC 81 MG EC tablet Take 81 mg by mouth daily.      Marland Kitchen atorvastatin (LIPITOR) 80 MG tablet Take 1 tablet (80 mg total) by mouth daily. 90 tablet 0  . Continuous Blood Gluc Receiver (FREESTYLE LIBRE 14 DAY READER) DEVI 1 Device by Other route See admin instructions. 1 Device 0  . dorzolamide-timolol (COSOPT) 22.3-6.8 MG/ML ophthalmic solution Place 1 drop into both eyes 2 (two) times daily.     Marland Kitchen ezetimibe (ZETIA) 10 MG tablet Take 1 tablet (10 mg total) by mouth daily. 90 tablet 1  . glucagon 1 MG injection Inject 1 mg into the muscle once as needed. 1 each 12  . hydrochlorothiazide (HYDRODIURIL) 25 MG tablet TAKE 1 TABLET BY MOUTH ONCE DAILY(SCHEDULE ANNUAL OFFICE EXAM) 90 tablet 0  . insulin aspart (NOVOLOG) 100 UNIT/ML injection Inject 5 Units into the skin daily with breakfast. 10  mL 11  . insulin detemir (LEVEMIR) 100 UNIT/ML injection Inject 0.65 mLs (65 Units total) into the skin every morning. And syringes 1/day 30 mL 11  . INSULIN SYRINGE .5CC/29G (B-D INSULIN SYRINGE) 29G X 1/2" 0.5 ML MISC 1 each by Does not apply route daily. For use with Novolog insulin to ensure accurate dosage has been drawn up; E11.9 90 each 0  . Insulin Syringe-Needle U-100 (INSULIN SYRINGE 1CC/31GX5/16") 31G X 5/16" 1 ML MISC Use to inject insulin 2 times a day. 100 each 1  . lisinopril (ZESTRIL) 2.5 MG tablet TAKE 1 TABLET BY MOUTH ONCE DAILY(SCHEULE ANNUAL OFFICE EXAM) 90 tablet 1  . Multiple Vitamins-Minerals (CENTRUM SILVER ULTRA MENS PO) Take 1 tablet by mouth daily.     . sildenafil (REVATIO) 20 MG tablet Take 1 to 2 tabs 2 - 3 hours before sex 20 tablet 11   No current facility-administered medications on file prior to visit.    No Known Allergies  Family History  Problem Relation Age of Onset  . Alcohol abuse Father   . Diabetes Sister   . Colon cancer Neg Hx   . Esophageal cancer Neg Hx   . Stomach cancer Neg Hx   . Rectal cancer Neg Hx     BP 132/80   Pulse 72   Ht 6\' 3"  (1.905 m)   Wt 205 lb 12.8 oz (93.4 kg)   SpO2 98%   BMI 25.72 kg/m    Review of Systems Denies LOC    Objective:   Physical Exam VITAL SIGNS:  See vs page GENERAL: no distress Pulses: dorsalis pedis intact bilat.   MSK: no deformity of the feet CV: no leg edema Skin:  no ulcer on the feet.  normal color and temp on the feet. Neuro: sensation is intact to touch on the feet.    Lab Results  Component Value Date   HGBA1C 8.8 (A) 06/14/2020       Assessment & Plan:  Insulin-requiring type 2 DM: worse Hypoglycemia, due to insulin: this limits aggressiveness of glycemic control.    Patient Instructions  check your blood sugar twice a day.  vary the time of day when you check, between before the 3 meals, and at bedtime.  also check if you have symptoms of your blood sugar being too  high or too low.  please keep a record of the readings and bring it to your next appointment here (or you can bring the meter itself).  You can write it on any piece of paper.  please call  us sooner if your blood sugar goes below 70, or if you have a lot of readings over 200.   Please continue the same insulins.      On this type of insulin schedule, you should eat meals on a regular schedule.  If a meal is missed or significantly delayed, your blood sugar could go low.   Please see a diabetes education specialist, to learn about the V-GO disposable insulin pump.   Please come back for a follow-up appointment in 2 months.

## 2020-06-14 NOTE — Patient Instructions (Addendum)
check your blood sugar twice a day.  vary the time of day when you check, between before the 3 meals, and at bedtime.  also check if you have symptoms of your blood sugar being too high or too low.  please keep a record of the readings and bring it to your next appointment here (or you can bring the meter itself).  You can write it on any piece of paper.  please call us sooner if your blood sugar goes below 70, or if you have a lot of readings over 200.   Please continue the same insulins.      On this type of insulin schedule, you should eat meals on a regular schedule.  If a meal is missed or significantly delayed, your blood sugar could go low.   Please see a diabetes education specialist, to learn about the V-GO disposable insulin pump.   Please come back for a follow-up appointment in 2 months.
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
## 2020-07-18 ENCOUNTER — Ambulatory Visit: Payer: Medicare HMO | Admitting: Endocrinology

## 2020-07-25 ENCOUNTER — Telehealth: Payer: Self-pay | Admitting: Internal Medicine

## 2020-07-25 DIAGNOSIS — Z23 Encounter for immunization: Secondary | ICD-10-CM

## 2020-07-25 DIAGNOSIS — Z0000 Encounter for general adult medical examination without abnormal findings: Secondary | ICD-10-CM

## 2020-07-25 MED ORDER — HYDROCHLOROTHIAZIDE 25 MG PO TABS: ORAL_TABLET | ORAL | 0 refills | Status: DC

## 2020-07-25 MED ORDER — LISINOPRIL 2.5 MG PO TABS: ORAL_TABLET | ORAL | 0 refills | Status: DC

## 2020-07-25 NOTE — Telephone Encounter (Signed)
Refill sent.

## 2020-07-25 NOTE — Telephone Encounter (Signed)
Pt called to say he needs the following prescription refilled:  lisinopril (ZESTRIL) 2.5 MG tablet   hydrochlorothiazide (HYDRODIURIL) 25 MG tablet   [facility] [address]  Pt scheduled CPE for 11/24  Please advise

## 2020-08-04 ENCOUNTER — Other Ambulatory Visit: Payer: Self-pay

## 2020-08-04 ENCOUNTER — Telehealth: Payer: Self-pay | Admitting: Endocrinology

## 2020-08-04 DIAGNOSIS — E108 Type 1 diabetes mellitus with unspecified complications: Secondary | ICD-10-CM

## 2020-08-04 MED ORDER — "INSULIN SYRINGE 31G X 5/16"" 1 ML MISC": 1.0000 | Freq: Two times a day (BID) | 0 refills | Status: DC

## 2020-08-04 NOTE — Telephone Encounter (Signed)
Medication Refill Request  Did you call your pharmacy and request this refill first? No - patient said does not have phone number and not ours either so he came in office to request these.  . If patient has not contacted pharmacy first, instruct them to do so for future refills.  . Remind them that contacting the pharmacy for their refill is the quickest method to get the refill.  . Refill policy also stated that it will take anywhere between 24-72 hours to receive the refill.    Name of medication? Insulin Syringe-Needle U-100 (INSULIN SYRINGE 1CC/31GX5/16") 31G X 5/16" 1 ML MISC    Is this a 90 day supply? yes  Name and location of pharmacy?  [facility] ([address] [place] [address] Phone:  [phone]  Fax:  [phone]

## 2020-08-04 NOTE — Telephone Encounter (Signed)
Outpatient Medication Detail   Disp Refills Start End   Insulin Syringe-Needle U-100 (INSULIN SYRINGE 1CC/31GX5/16") 31G X 5/16" 1 ML MISC 200 each 0 08/04/2020    Sig - Route: 1 each by Other route 2 (two) times daily. E11.9 - Other   Sent to pharmacy as: Insulin Syringe-Needle U-100 (INSULIN SYRINGE 1CC/31GX5/16") 31G X 5/16" 1 ML Misc   Notes to Pharmacy: E11.9   E-Prescribing Status: Receipt confirmed by pharmacy (08/04/2020 11:53 AM EDT)

## 2020-08-08 ENCOUNTER — Telehealth: Payer: Self-pay

## 2020-08-08 NOTE — Progress Notes (Signed)
Spoke to patient to confirmed patient office appointment on 08/09/2020 for CCM at 2:00 PM with Junius Argyle the Clinical pharmacist.   Patient states to cancel his appointment at this time.  [facility] Pharmacist Assistant [phone]

## 2020-08-09 ENCOUNTER — Ambulatory Visit: Payer: Medicare HMO

## 2020-08-16 ENCOUNTER — Encounter: Payer: Self-pay | Admitting: Endocrinology

## 2020-08-16 ENCOUNTER — Ambulatory Visit (INDEPENDENT_AMBULATORY_CARE_PROVIDER_SITE_OTHER): Payer: Medicare HMO | Admitting: Endocrinology

## 2020-08-16 ENCOUNTER — Other Ambulatory Visit: Payer: Self-pay

## 2020-08-16 VITALS — BP 100/70 | HR 70 | Ht 75.0 in | Wt 201.8 lb

## 2020-08-16 DIAGNOSIS — E108 Type 1 diabetes mellitus with unspecified complications: Secondary | ICD-10-CM | POA: Diagnosis not present

## 2020-08-16 LAB — POCT GLYCOSYLATED HEMOGLOBIN (HGB A1C): Hemoglobin A1C: 9 % — AB (ref 4.0–5.6)

## 2020-08-16 NOTE — Progress Notes (Signed)
Subjective:    Patient ID: Christopher [name], male    DOB: 1946/08/30, 74 y.o.   MRN: [id number]  HPI Pt returns for f/u of diabetes mellitus:  DM type: Insulin-requiring type 2 (but he may be evolving type 1).  Dx'ed: 2010.   Complications: PN and CRI.   Therapy: insulin since 2013.   DKA: never.   Severe hypoglycemia: last episode was in 2018.   Pancreatitis: never.  SDOH: he uses syringe and vial, after difficulty operating pen; he is back on QD insulin, after noncompliance with multiple daily injections.   Other: he could not take lantus, due to AM hypoglycemia and PM hyperglycemia; however, he changed back after poor results with faster-acting qd insulin.   Interval history: I reviewed continuous glucose monitor data.  Glucose varies from 40-400.  It is still in general highest after breakfast, and lowest in the afternoon.  pt states he feels well in general. He says he does not miss any insulin doses.  He has mild hypoglycemia approx once per week.  This usually happens in the afternoon, after he misses lunch.   Past Medical History:  Diagnosis Date  . Diabetes mellitus type II   . GERD (gastroesophageal reflux disease)   . Glaucoma   . Hyperlipidemia   . Hypertension     Past Surgical History:  Procedure Laterality Date  . COLONOSCOPY    . HERNIA REPAIR    . [place]    Social History   Socioeconomic History  . Marital status: Married    Spouse name: Not on file  . Number of children: Not on file  . Years of education: Not on file  . Highest education level: Not on file  Occupational History  . Not on file  Tobacco Use  . Smoking status: Never Smoker  . Smokeless tobacco: Never Used  Vaping Use  . Vaping Use: Never used  Substance and Sexual Activity  . Alcohol use: No  . Drug use: No  . Sexual activity: Not on file  Other Topics Concern  . Not on file  Social History Narrative  . Not on file   Social Determinants of Health    Financial Resource Strain:   . Difficulty of Paying Living Expenses: Not on file  Food Insecurity:   . Worried About Charity fundraiser in the Last Year: Not on file  . Ran Out of Food in the Last Year: Not on file  Transportation Needs: No Transportation Needs  . Lack of Transportation (Medical): No  . Lack of Transportation (Non-Medical): No  Physical Activity: Sufficiently Active  . Days of Exercise per Week: 5 days  . Minutes of Exercise per Session: 60 min  Stress:   . Feeling of Stress : Not on file  Social Connections:   . Frequency of Communication with Friends and Family: Not on file  . Frequency of Social Gatherings with Friends and Family: Not on file  . Attends Religious Services: Not on file  . Active Member of Clubs or Organizations: Not on file  . Attends Archivist Meetings: Not on file  . Marital Status: Not on file  Intimate Partner Violence:   . Fear of Current or Ex-Partner: Not on file  . Emotionally Abused: Not on file  . Physically Abused: Not on file  . Sexually Abused: Not on file    Current Outpatient Medications on File Prior to Visit  Medication Sig Dispense Refill  .  Alcohol Swabs (B-D SINGLE USE SWABS REGULAR) PADS USE TO CHECK BLOOD SUGAR 2 TIMES PER DAY 200 each prn  . ALPHAGAN P 0.1 % SOLN Place 1 drop into both eyes daily.     Marland Kitchen aspirin EC 81 MG EC tablet Take 81 mg by mouth daily.      Marland Kitchen atorvastatin (LIPITOR) 80 MG tablet Take 1 tablet (80 mg total) by mouth daily. 90 tablet 0  . Continuous Blood Gluc Receiver (FREESTYLE LIBRE 14 DAY READER) DEVI 1 Device by Other route See admin instructions. 1 Device 0  . Continuous Blood Gluc Sensor (FREESTYLE LIBRE 14 DAY SENSOR) MISC 1 Device by Other route every 14 (fourteen) days. USE 1  EVERY TWO WEEKS 6 each 3  . dorzolamide-timolol (COSOPT) 22.3-6.8 MG/ML ophthalmic solution Place 1 drop into both eyes 2 (two) times daily.     Marland Kitchen ezetimibe (ZETIA) 10 MG tablet Take 1 tablet (10 mg total)  by mouth daily. 90 tablet 1  . glucagon 1 MG injection Inject 1 mg into the muscle once as needed. 1 each 12  . hydrochlorothiazide (HYDRODIURIL) 25 MG tablet TAKE 1 TABLET BY MOUTH ONCE DAILY 90 tablet 0  . insulin aspart (NOVOLOG) 100 UNIT/ML injection Inject 5 Units into the skin daily with breakfast. 10 mL 11  . insulin detemir (LEVEMIR) 100 UNIT/ML injection Inject 0.65 mLs (65 Units total) into the skin every morning. And syringes 1/day 30 mL 11  . Insulin Syringe-Needle U-100 (INSULIN SYRINGE 1CC/31GX5/16") 31G X 5/16" 1 ML MISC 1 each by Other route 2 (two) times daily. E11.9 200 each 0  . lisinopril (ZESTRIL) 2.5 MG tablet TAKE 1 TABLET BY MOUTH ONCE DAILY(SCHEULE ANNUAL OFFICE EXAM) 90 tablet 0  . Multiple Vitamins-Minerals (CENTRUM SILVER ULTRA MENS PO) Take 1 tablet by mouth daily.     . sildenafil (REVATIO) 20 MG tablet Take 1 to 2 tabs 2 - 3 hours before sex 20 tablet 11   No current facility-administered medications on file prior to visit.    No Known Allergies  Family History  Problem Relation Age of Onset  . Alcohol abuse Father   . Diabetes Sister   . Colon cancer Neg Hx   . Esophageal cancer Neg Hx   . Stomach cancer Neg Hx   . Rectal cancer Neg Hx     BP 100/70   Pulse 70   Ht 6\' 3"  (1.905 m)   Wt 201 lb 12.8 oz (91.5 kg)   SpO2 98%   BMI 25.22 kg/m    Review of Systems Denies LOC.      Objective:   Physical Exam VITAL SIGNS:  See vs page GENERAL: no distress Pulses: dorsalis pedis intact bilat.   MSK: no deformity of the feet CV: no leg edema Skin:  no ulcer on the feet.  normal color and temp on the feet. Neuro: sensation is intact to touch on the feet Ext: there is bilateral onychomycosis of the toenails  Lab Results  Component Value Date   HGBA1C 9.0 (A) 08/16/2020        Assessment & Plan:  Insulin-requiring type 2 DM, with CRI: uncontrolled. Hypoglycemia, due to insulin: this limits aggressiveness of glycemic control   Patient  Instructions  check your blood sugar twice a day.  vary the time of day when you check, between before the 3 meals, and at bedtime.  also check if you have symptoms of your blood sugar being too high or too low.  please keep  a record of the readings and bring it to your next appointment here (or you can bring the meter itself).  You can write it on any piece of paper.  please call us sooner if your blood sugar goes below 70, or if you have a lot of readings over 200.   Please continue the same insulins for now.   On this type of insulin schedule, you should eat meals on a regular schedule.  If a meal is missed or significantly delayed, your blood sugar could go low.   Please see a diabetes education specialist, to learn about the V-GO disposable insulin pump.   Please come back for a follow-up appointment in 2 months.

## 2020-08-16 NOTE — Patient Instructions (Addendum)
check your blood sugar twice a day.  vary the time of day when you check, between before the 3 meals, and at bedtime.  also check if you have symptoms of your blood sugar being too high or too low.  please keep a record of the readings and bring it to your next appointment here (or you can bring the meter itself).  You can write it on any piece of paper.  please call us sooner if your blood sugar goes below 70, or if you have a lot of readings over 200.   Please continue the same insulins for now.   On this type of insulin schedule, you should eat meals on a regular schedule.  If a meal is missed or significantly delayed, your blood sugar could go low.   Please see a diabetes education specialist, to learn about the V-GO disposable insulin pump.   Please come back for a follow-up appointment in 2 months.

## 2020-09-18 DIAGNOSIS — H524 Presbyopia: Secondary | ICD-10-CM | POA: Diagnosis not present

## 2020-09-18 DIAGNOSIS — H401131 Primary open-angle glaucoma, bilateral, mild stage: Secondary | ICD-10-CM | POA: Diagnosis not present

## 2020-09-18 DIAGNOSIS — E119 Type 2 diabetes mellitus without complications: Secondary | ICD-10-CM | POA: Diagnosis not present

## 2020-09-18 LAB — HM DIABETES EYE EXAM

## 2020-09-26 ENCOUNTER — Encounter: Payer: Medicare HMO | Attending: Endocrinology | Admitting: Dietician

## 2020-09-26 ENCOUNTER — Ambulatory Visit: Payer: Medicare HMO | Admitting: Nutrition

## 2020-10-16 ENCOUNTER — Encounter: Payer: Self-pay | Admitting: Internal Medicine

## 2020-10-16 NOTE — Telephone Encounter (Signed)
Error

## 2020-10-18 ENCOUNTER — Encounter: Payer: Medicare HMO | Attending: Endocrinology | Admitting: Nutrition

## 2020-10-18 ENCOUNTER — Other Ambulatory Visit: Payer: Self-pay

## 2020-10-18 DIAGNOSIS — E108 Type 1 diabetes mellitus with unspecified complications: Secondary | ICD-10-CM

## 2020-10-18 NOTE — Patient Instructions (Signed)
Call to let me know if you would like to do a  6 day free trial of this:  [phone]

## 2020-10-18 NOTE — Progress Notes (Signed)
We discussed how this V-go works and how it will benefit him. His diet is well balanced and low in fat.  Bfast is C[place], lunch is a sandwich with occasional small sweet potato, or tuna sandwich, Supper is 6 ounces protein with one starch an 2 non starchy veg.  He drinks only diet drinks and water.  He was shown on his Elen[facility] [place] how is blood sugar is going up over 300 sometimes after supper, and how this v-go can be use to prevent this.  His V-go also shows some lows that he says he has no symptoms.  He is currently on 70u of Levemir, and 5u of Novolog acB.   He filled out an insurance investigation form and said that if he can afford this, he would like to do a 6 day trial of this.  He was given my number to call if, after he hears back from V-go, he decides to try this.  He had no final quesitons.

## 2020-10-25 ENCOUNTER — Ambulatory Visit (INDEPENDENT_AMBULATORY_CARE_PROVIDER_SITE_OTHER): Payer: Medicare HMO | Admitting: Endocrinology

## 2020-10-25 ENCOUNTER — Encounter: Payer: Self-pay | Admitting: Endocrinology

## 2020-10-25 ENCOUNTER — Other Ambulatory Visit: Payer: Self-pay

## 2020-10-25 VITALS — BP 122/82 | HR 69 | Ht 75.0 in | Wt 204.6 lb

## 2020-10-25 DIAGNOSIS — E108 Type 1 diabetes mellitus with unspecified complications: Secondary | ICD-10-CM | POA: Diagnosis not present

## 2020-10-25 LAB — POCT GLYCOSYLATED HEMOGLOBIN (HGB A1C): Hemoglobin A1C: 8.4 % — AB (ref 4.0–5.6)

## 2020-10-25 MED ORDER — FREESTYLE LIBRE 14 DAY SENSOR MISC: 1.0000 | 3 refills | Status: DC

## 2020-10-25 MED ORDER — INSULIN ASPART 100 UNIT/ML ~~LOC~~ SOLN: 8.0000 [IU] | Freq: Every day | SUBCUTANEOUS | 11 refills | Status: DC

## 2020-10-25 MED ORDER — INSULIN DETEMIR 100 UNIT/ML ~~LOC~~ SOLN: 60.0000 [IU] | SUBCUTANEOUS | 11 refills | Status: DC

## 2020-10-25 NOTE — Progress Notes (Signed)
Subjective:    Patient ID: Christopher [name], male    DOB: 1946-10-12, 74 y.o.   MRN: [id number]  HPI Pt returns for f/u of diabetes mellitus:  DM type: Insulin-requiring type 2 (but he may be evolving type 1).  Dx'ed: 2010.   Complications: PN and CRI.   Therapy: insulin since 2013.   DKA: never.   Severe hypoglycemia: last episode was in 2018.   Pancreatitis: never.  SDOH: he uses syringe and vial, after difficulty operating pen; he is back on 2 QD insulins, after noncompliance with multiple daily injections.   Other: he could not take lantus, due to AM hypoglycemia and PM hyperglycemia; however, he changed back after poor results with faster-acting qd insulin.   Interval history: I reviewed continuous glucose monitor data.  Glucose varies from 40-330.  It is still in general highest after breakfast.  pt states he feels well in general. He says he does not miss any insulin doses.  hypoglycemia usually happens fasting or in the afternoon, after he misses lunch.  He declines to pursue the V-GO pump.   Past Medical History:  Diagnosis Date  . Diabetes mellitus type II   . GERD (gastroesophageal reflux disease)   . Glaucoma   . Hyperlipidemia   . Hypertension     Past Surgical History:  Procedure Laterality Date  . COLONOSCOPY    . HERNIA REPAIR    . [place]    Social History   Socioeconomic History  . Marital status: Married    Spouse name: Not on file  . Number of children: Not on file  . Years of education: Not on file  . Highest education level: Not on file  Occupational History  . Not on file  Tobacco Use  . Smoking status: Never Smoker  . Smokeless tobacco: Never Used  Vaping Use  . Vaping Use: Never used  Substance and Sexual Activity  . Alcohol use: No  . Drug use: No  . Sexual activity: Not on file  Other Topics Concern  . Not on file  Social History Narrative  . Not on file   Social Determinants of Health   Financial Resource  Strain:   . Difficulty of Paying Living Expenses: Not on file  Food Insecurity:   . Worried About Charity fundraiser in the Last Year: Not on file  . Ran Out of Food in the Last Year: Not on file  Transportation Needs: No Transportation Needs  . Lack of Transportation (Medical): No  . Lack of Transportation (Non-Medical): No  Physical Activity: Sufficiently Active  . Days of Exercise per Week: 5 days  . Minutes of Exercise per Session: 60 min  Stress:   . Feeling of Stress : Not on file  Social Connections:   . Frequency of Communication with Friends and Family: Not on file  . Frequency of Social Gatherings with Friends and Family: Not on file  . Attends Religious Services: Not on file  . Active Member of Clubs or Organizations: Not on file  . Attends Archivist Meetings: Not on file  . Marital Status: Not on file  Intimate Partner Violence:   . Fear of Current or Ex-Partner: Not on file  . Emotionally Abused: Not on file  . Physically Abused: Not on file  . Sexually Abused: Not on file    Current Outpatient Medications on File Prior to Visit  Medication Sig Dispense Refill  . Alcohol Swabs (B-D  SINGLE USE SWABS REGULAR) PADS USE TO CHECK BLOOD SUGAR 2 TIMES PER DAY 200 each prn  . ALPHAGAN P 0.1 % SOLN Place 1 drop into both eyes daily.     Marland Kitchen aspirin EC 81 MG EC tablet Take 81 mg by mouth daily.      Marland Kitchen atorvastatin (LIPITOR) 80 MG tablet Take 1 tablet (80 mg total) by mouth daily. (Patient taking differently: Take 65 mg by mouth daily. ) 90 tablet 0  . Continuous Blood Gluc Receiver (FREESTYLE LIBRE 14 DAY READER) DEVI 1 Device by Other route See admin instructions. 1 Device 0  . dorzolamide-timolol (COSOPT) 22.3-6.8 MG/ML ophthalmic solution Place 1 drop into both eyes 2 (two) times daily.     Marland Kitchen ezetimibe (ZETIA) 10 MG tablet Take 1 tablet (10 mg total) by mouth daily. 90 tablet 1  . glucagon 1 MG injection Inject 1 mg into the muscle once as needed. 1 each 12  .  hydrochlorothiazide (HYDRODIURIL) 25 MG tablet TAKE 1 TABLET BY MOUTH ONCE DAILY 90 tablet 0  . Insulin Syringe-Needle U-100 (INSULIN SYRINGE 1CC/31GX5/16") 31G X 5/16" 1 ML MISC 1 each by Other route 2 (two) times daily. E11.9 200 each 0  . lisinopril (ZESTRIL) 2.5 MG tablet TAKE 1 TABLET BY MOUTH ONCE DAILY(SCHEULE ANNUAL OFFICE EXAM) 90 tablet 0  . Multiple Vitamins-Minerals (CENTRUM SILVER ULTRA MENS PO) Take 1 tablet by mouth daily.     . sildenafil (REVATIO) 20 MG tablet Take 1 to 2 tabs 2 - 3 hours before sex 20 tablet 11   No current facility-administered medications on file prior to visit.    No Known Allergies  Family History  Problem Relation Age of Onset  . Alcohol abuse Father   . Diabetes Sister   . Colon cancer Neg Hx   . Esophageal cancer Neg Hx   . Stomach cancer Neg Hx   . Rectal cancer Neg Hx     BP 122/82   Pulse 69   Ht 6\' 3"  (1.905 m)   Wt 204 lb 9.6 oz (92.8 kg)   SpO2 92%   BMI 25.57 kg/m    Review of Systems     Objective:   Physical Exam VITAL SIGNS:  See vs page GENERAL: no distress Pulses: dorsalis pedis intact bilat.   MSK: no deformity of the feet CV: no leg edema Skin:  no ulcer on the feet.  normal color and temp on the feet. Neuro: sensation is intact to touch on the feet Ext: there is bilateral onychomycosis of the toenails  A1c=8.4%    Assessment & Plan:  Insulin-requiring type 2 DM, with CRI: uncontrolled Hypoglycemia, due to insulin: this limits aggressiveness of glycemic control   Patient Instructions  check your blood sugar twice a day.  vary the time of day when you check, between before the 3 meals, and at bedtime.  also check if you have symptoms of your blood sugar being too high or too low.  please keep a record of the readings and bring it to your next appointment here (or you can bring the meter itself).  You can write it on any piece of paper.  please call us sooner if your blood sugar goes below 70, or if you have a  lot of readings over 200.   Please change the insulins to the numbers listed below.   On this type of insulin schedule, you should eat meals on a regular schedule.  If a meal is missed or  significantly delayed, your blood sugar could go low.   Please come back for a follow-up appointment in 2 months.

## 2020-10-25 NOTE — Patient Instructions (Addendum)
check your blood sugar twice a day.  vary the time of day when you check, between before the 3 meals, and at bedtime.  also check if you have symptoms of your blood sugar being too high or too low.  please keep a record of the readings and bring it to your next appointment here (or you can bring the meter itself).  You can write it on any piece of paper.  please call us sooner if your blood sugar goes below 70, or if you have a lot of readings over 200.   Please change the insulins to the numbers listed below.   On this type of insulin schedule, you should eat meals on a regular schedule.  If a meal is missed or significantly delayed, your blood sugar could go low.   Please come back for a follow-up appointment in 2 months.

## 2020-10-30 ENCOUNTER — Other Ambulatory Visit: Payer: Self-pay | Admitting: Internal Medicine

## 2020-10-30 ENCOUNTER — Other Ambulatory Visit: Payer: Self-pay | Admitting: *Deleted

## 2020-10-30 ENCOUNTER — Telehealth: Payer: Self-pay | Admitting: Endocrinology

## 2020-10-30 DIAGNOSIS — E785 Hyperlipidemia, unspecified: Secondary | ICD-10-CM

## 2020-10-30 DIAGNOSIS — Z0000 Encounter for general adult medical examination without abnormal findings: Secondary | ICD-10-CM

## 2020-10-30 MED ORDER — V-GO 20 KIT: 1.0000 | PACK | Freq: Every day | 3 refills | Status: DC

## 2020-10-30 NOTE — Telephone Encounter (Signed)
Ok, I have sent a prescription to your pharmacy 

## 2020-10-30 NOTE — Telephone Encounter (Signed)
Per VGO a prescription for this patients VGO needs to be sent to [facility] on [address] so that insurance information can be established.   Call for questions or clarification [phone]

## 2020-10-30 NOTE — Telephone Encounter (Signed)
Please see below.

## 2020-11-08 ENCOUNTER — Encounter: Payer: Medicare HMO | Admitting: Internal Medicine

## 2020-11-17 ENCOUNTER — Other Ambulatory Visit: Payer: Self-pay | Admitting: Endocrinology

## 2020-11-17 DIAGNOSIS — E108 Type 1 diabetes mellitus with unspecified complications: Secondary | ICD-10-CM

## 2020-12-01 ENCOUNTER — Other Ambulatory Visit: Payer: Self-pay | Admitting: *Deleted

## 2020-12-01 DIAGNOSIS — Z23 Encounter for immunization: Secondary | ICD-10-CM

## 2020-12-01 MED ORDER — LISINOPRIL 2.5 MG PO TABS: ORAL_TABLET | ORAL | 0 refills | Status: DC

## 2020-12-13 ENCOUNTER — Encounter: Payer: Self-pay | Admitting: Internal Medicine

## 2020-12-13 ENCOUNTER — Other Ambulatory Visit: Payer: Self-pay

## 2020-12-13 ENCOUNTER — Ambulatory Visit (INDEPENDENT_AMBULATORY_CARE_PROVIDER_SITE_OTHER): Payer: Medicare HMO | Admitting: Internal Medicine

## 2020-12-13 VITALS — BP 110/68 | HR 66 | Temp 98.1°F | Wt 202.4 lb

## 2020-12-13 DIAGNOSIS — Z0000 Encounter for general adult medical examination without abnormal findings: Secondary | ICD-10-CM | POA: Diagnosis not present

## 2020-12-13 DIAGNOSIS — Z23 Encounter for immunization: Secondary | ICD-10-CM

## 2020-12-13 DIAGNOSIS — H409 Unspecified glaucoma: Secondary | ICD-10-CM

## 2020-12-13 DIAGNOSIS — E108 Type 1 diabetes mellitus with unspecified complications: Secondary | ICD-10-CM | POA: Diagnosis not present

## 2020-12-13 DIAGNOSIS — E785 Hyperlipidemia, unspecified: Secondary | ICD-10-CM

## 2020-12-13 LAB — COMPREHENSIVE METABOLIC PANEL WITH GFR
ALT: 16 U/L (ref 0–53)
AST: 19 U/L (ref 0–37)
Albumin: 4.3 g/dL (ref 3.5–5.2)
Alkaline Phosphatase: 92 U/L (ref 39–117)
BUN: 23 mg/dL (ref 6–23)
CO2: 30 meq/L (ref 19–32)
Calcium: 9.8 mg/dL (ref 8.4–10.5)
Chloride: 103 meq/L (ref 96–112)
Creatinine, Ser: 1.42 mg/dL (ref 0.40–1.50)
GFR: 49.03 mL/min — ABNORMAL LOW
Glucose, Bld: 109 mg/dL — ABNORMAL HIGH (ref 70–99)
Potassium: 3.7 meq/L (ref 3.5–5.1)
Sodium: 141 meq/L (ref 135–145)
Total Bilirubin: 0.6 mg/dL (ref 0.2–1.2)
Total Protein: 6.9 g/dL (ref 6.0–8.3)

## 2020-12-13 LAB — LIPID PANEL
Cholesterol: 161 mg/dL (ref 0–200)
HDL: 40.3 mg/dL (ref >39.00)
LDL Cholesterol: 104 mg/dL — ABNORMAL HIGH (ref 0–99)
NonHDL: 120.37
Total CHOL/HDL Ratio: 4
Triglycerides: 81 mg/dL (ref 0.0–149.0)
VLDL: 16.2 mg/dL (ref 0.0–40.0)

## 2020-12-13 LAB — CBC WITH DIFFERENTIAL/PLATELET
Basophils Absolute: 0 10*3/uL (ref 0.0–0.1)
Basophils Relative: 0.7 % (ref 0.0–3.0)
Eosinophils Absolute: 0.2 10*3/uL (ref 0.0–0.7)
Eosinophils Relative: 3.5 % (ref 0.0–5.0)
HCT: 40.4 % (ref 39.0–52.0)
Hemoglobin: 13.8 g/dL (ref 13.0–17.0)
Lymphocytes Relative: 23.5 % (ref 12.0–46.0)
Lymphs Abs: 1.3 10*3/uL (ref 0.7–4.0)
MCHC: 34.1 g/dL (ref 30.0–36.0)
MCV: 78.1 fl (ref 78.0–100.0)
Monocytes Absolute: 0.5 10*3/uL (ref 0.1–1.0)
Monocytes Relative: 8.4 % (ref 3.0–12.0)
Neutro Abs: 3.5 10*3/uL (ref 1.4–7.7)
Neutrophils Relative %: 63.9 % (ref 43.0–77.0)
Platelets: 262 10*3/uL (ref 150.0–400.0)
RBC: 5.18 Mil/uL (ref 4.22–5.81)
RDW: 14.4 % (ref 11.5–15.5)
WBC: 5.5 10*3/uL (ref 4.0–10.5)

## 2020-12-13 LAB — VITAMIN D 25 HYDROXY (VIT D DEFICIENCY, FRACTURES): VITD: 38.28 ng/mL (ref 30.00–100.00)

## 2020-12-13 LAB — PSA: PSA: 0.73 ng/mL (ref 0.10–4.00)

## 2020-12-13 LAB — TSH: TSH: 0.95 u[IU]/mL (ref 0.35–4.50)

## 2020-12-13 LAB — VITAMIN B12: Vitamin B-12: 719 pg/mL (ref 211–911)

## 2020-12-13 NOTE — Progress Notes (Signed)
Established Patient Office Visit     This visit occurred during the SARS-CoV-2 public health emergency.  Safety protocols were in place, including screening questions prior to the visit, additional usage of staff PPE, and extensive cleaning of exam room while observing appropriate contact time as indicated for disinfecting solutions.    CC/Reason for Visit: Annual preventive exam and subsequent Medicare wellness visit  HPI: Christopher [name] is a 74 y.o. male who is coming in today for the above mentioned reasons. Past Medical History is significant for: Well-controlled hypertension, glaucoma, labile insulin-dependent diabetes followed by endocrinology, Dr. Loanne Drilling. Hyperlipidemia that has not been well controlled.  He has no acute complaints today.  He is taking all his medications without issues.  He does yoga twice a week also does weights and rides a bicycle 2 to 3 days a week.  He has had all 3 Covid vaccines, requesting flu vaccine today, also due for shingles vaccination series.  He has routine eye care but no dental care.  No perceived hearing issues.  He had a colonoscopy in 2020.   Past Medical/Surgical History: Past Medical History:  Diagnosis Date  . Diabetes mellitus type II   . GERD (gastroesophageal reflux disease)   . Glaucoma   . Hyperlipidemia   . Hypertension     Past Surgical History:  Procedure Laterality Date  . COLONOSCOPY    . HERNIA REPAIR    . [place]    Social History:  reports that he has never smoked. He has never used smokeless tobacco. He reports that he does not drink alcohol and does not use drugs.  Allergies: No Known Allergies  Family History:  Family History  Problem Relation Age of Onset  . Alcohol abuse Father   . Diabetes Sister   . Colon cancer Neg Hx   . Esophageal cancer Neg Hx   . Stomach cancer Neg Hx   . Rectal cancer Neg Hx      Current Outpatient Medications:  .  Alcohol Swabs (B-D SINGLE USE  SWABS REGULAR) PADS, USE TO CHECK BLOOD SUGAR 2 TIMES PER DAY, Disp: 200 each, Rfl: prn .  ALPHAGAN P 0.1 % SOLN, Place 1 drop into both eyes daily. , Disp: , Rfl:  .  aspirin EC 81 MG EC tablet, Take 81 mg by mouth daily., Disp: , Rfl:  .  atorvastatin (LIPITOR) 80 MG tablet, Take 1 tablet by mouth once daily, Disp: 90 tablet, Rfl: 1 .  BD VEO INSULIN SYRINGE U/F 31G X 15/64" 1 ML MISC, USE 1 SYRINGE TWICE DAILY, Disp: 200 each, Rfl: 0 .  CINNAMON PO, Take 100 mg by mouth daily., Disp: , Rfl:  .  Coenzyme Q10 (CO Q10) 100 MG CAPS, Take by mouth., Disp: , Rfl:  .  Continuous Blood Gluc Receiver (FREESTYLE LIBRE 14 DAY READER) DEVI, 1 Device by Other route See admin instructions., Disp: 1 Device, Rfl: 0 .  Continuous Blood Gluc Sensor (FREESTYLE LIBRE 14 DAY SENSOR) MISC, 1 Device by Other route every 14 (fourteen) days. USE 1  EVERY TWO WEEKS, Disp: 6 each, Rfl: 3 .  dorzolamide-timolol (COSOPT) 22.3-6.8 MG/ML ophthalmic solution, Place 1 drop into both eyes 2 (two) times daily. , Disp: , Rfl:  .  hydrochlorothiazide (HYDRODIURIL) 25 MG tablet, Take 1 tablet by mouth once daily, Disp: 90 tablet, Rfl: 1 .  insulin aspart (NOVOLOG) 100 UNIT/ML injection, Inject 8 Units into the skin daily with breakfast., Disp: 10  mL, Rfl: 11 .  insulin detemir (LEVEMIR) 100 UNIT/ML injection, Inject 0.6 mLs (60 Units total) into the skin every morning. And syringes 1/day, Disp: 30 mL, Rfl: 11 .  Insulin Disposable Pump (V-GO 20) KIT, 1 Device by Does not apply route daily., Disp: 90 kit, Rfl: 3 .  lisinopril (ZESTRIL) 2.5 MG tablet, TAKE 1 TABLET BY MOUTH ONCE DAILY(SCHEULE ANNUAL OFFICE EXAM), Disp: 90 tablet, Rfl: 0 .  Multiple Vitamins-Minerals (CENTRUM SILVER ULTRA MENS PO), Take 1 tablet by mouth daily., Disp: , Rfl:  .  sildenafil (REVATIO) 20 MG tablet, Take 1 to 2 tabs 2 - 3 hours before sex, Disp: 20 tablet, Rfl: 11 .  ezetimibe (ZETIA) 10 MG tablet, Take 1 tablet (10 mg total) by mouth daily. (Patient not  taking: Reported on 12/13/2020), Disp: 90 tablet, Rfl: 1 .  glucagon 1 MG injection, Inject 1 mg into the muscle once as needed. (Patient not taking: Reported on 12/13/2020), Disp: 1 each, Rfl: 12  Review of Systems:  Constitutional: Denies fever, chills, diaphoresis, appetite change and fatigue.  HEENT: Denies photophobia, eye pain, redness, hearing loss, ear pain, congestion, sore throat, rhinorrhea, sneezing, mouth sores, trouble swallowing, neck pain, neck stiffness and tinnitus.   Respiratory: Denies SOB, DOE, cough, chest tightness,  and wheezing.   Cardiovascular: Denies chest pain, palpitations and leg swelling.  Gastrointestinal: Denies nausea, vomiting, abdominal pain, diarrhea, constipation, blood in stool and abdominal distention.  Genitourinary: Denies dysuria, urgency, frequency, hematuria, flank pain and difficulty urinating.  Endocrine: Denies: hot or cold intolerance, sweats, changes in hair or nails, polyuria, polydipsia. Musculoskeletal: Denies myalgias, back pain, joint swelling, arthralgias and gait problem.  Skin: Denies pallor, rash and wound.  Neurological: Denies dizziness, seizures, syncope, weakness, light-headedness, numbness and headaches.  Hematological: Denies adenopathy. Easy bruising, personal or family bleeding history  Psychiatric/Behavioral: Denies suicidal ideation, mood changes, confusion, nervousness, sleep disturbance and agitation    Physical Exam: Vitals:   12/13/20 1141  BP: 110/68  Pulse: 66  Temp: 98.1 F (36.7 C)  TempSrc: Oral  SpO2: 98%  Weight: 202 lb 6.4 oz (91.8 kg)    Body mass index is 25.3 kg/m.   Constitutional: NAD, calm, comfortable Eyes: PERRL, lids and conjunctivae normal ENMT: Mucous membranes are moist. Posterior pharynx clear of any exudate or lesions. Normal dentition. Tympanic membrane is pearly white, no erythema or bulging. Neck: normal, supple, no masses, no thyromegaly Respiratory: clear to auscultation  bilaterally, no wheezing, no crackles. Normal respiratory effort. No accessory muscle use.  Cardiovascular: Regular rate and rhythm, no murmurs / rubs / gallops. No extremity edema. 2+ pedal pulses. No carotid bruits.  Abdomen: no tenderness, no masses palpated. No hepatosplenomegaly. Bowel sounds positive.  Musculoskeletal: no clubbing / cyanosis. No joint deformity upper and lower extremities. Good ROM, no contractures. Normal muscle tone.  Skin: no rashes, lesions, ulcers. No induration Neurologic: CN 2-12 grossly intact. Sensation intact, DTR normal. Strength 5/5 in all 4.  Psychiatric: Normal judgment and insight. Alert and oriented x 3. Normal mood.    Subsequent Medicare wellness visit   1. Risk factors, based on past  M,S,F -cardiovascular disease risk factors include age, gender, history of poorly controlled insulin-dependent diabetes, hypertension, hyperlipidemia   2.  Physical activities: He is quite physically active at the gym, rides bike, lifts weights and does yoga.   3.  Depression/mood:  Stable, not depressed   4.  Hearing:  No perceived issues   5.  ADL's: Independent in all ADLs  6.  Fall risk:  Low fall risk   7.  Home safety: No problems identified   8.  Height weight, and visual acuity: height and weight as above, vision:   Visual Acuity Screening   Right eye Left eye Both eyes  Without correction: 20/32 20/32 20/25   With correction:        9.  Counseling:  Advised to continue healthy lifestyle, advised shingles vaccine at his pharmacy.   10. Lab orders based on risk factors: Laboratory update will be reviewed   11. Referral :  None today   12. Care plan:  Follow-up with me in 6 months   13. Cognitive assessment:  No cognitive impairment   14. Screening: Patient provided with a written and personalized 5-10 year screening schedule in the AVS.   yes   15. Provider List Update:   PCP, ophthalmology  16. Advance Directives: Full code   [place] Office Visit from 12/13/2020 in [facility]  PHQ-9 Total Score 0      Fall Risk  12/13/2020 10/06/2019 03/05/2019 08/26/2018 05/21/2018  Falls in the past year? 0 0 0 No No  Number falls in past yr: 0 0 0 - -  Injury with Fall? 0 0 0 - -     Impression and Plan:  Encounter for preventive health examination  -He has routine eye care, have advised routine dental care. -Flu vaccine today, will obtain shingles vaccination at pharmacy, otherwise immunizations are up-to-date and age-appropriate. -Screening labs today. -Healthy lifestyle discussed in detail. -He had a colonoscopy in 2020 and is a 5-year callback. -PSA today for prostate cancer screening.  Hyperlipidemia, unspecified hyperlipidemia type  - Plan: Lipid panel -He is on max dose atorvastatin and ezetimibe 10 mg daily that was started 6 months ago. -If still not at goal consider lipid referral.  Type 1 diabetes mellitus with unspecified complications ([place])  -Last A1c was 8.4 in November. -Follows with endocrinology.  Glaucoma, unspecified glaucoma type, unspecified laterality -Follows routinely with ophthalmology, he had no noted diabetic retinopathy in his most recent eye exam in October.   Patient Instructions  -Nice seeing you today!!  -Lab work today; will notify you once results are available.  -Flu vaccine today.  -Remember to schedule your shingles vaccination series at your pharmacy.  -Schedule follow up in 6 months.   Preventive Care 51 Years and Older, Male Preventive care refers to lifestyle choices and visits with your health care provider that can promote health and wellness. This includes:  A yearly physical exam. This is also called an annual well check.  Regular dental and eye exams.  Immunizations.  Screening for certain conditions.  Healthy lifestyle choices, such as diet and exercise. What can I expect for my preventive care visit? Physical exam Your health care  provider will check:  Height and weight. These may be used to calculate body mass index (BMI), which is a measurement that tells if you are at a healthy weight.  Heart rate and blood pressure.  Your skin for abnormal spots. Counseling Your health care provider may ask you questions about:  Alcohol, tobacco, and drug use.  Emotional well-being.  Home and relationship well-being.  Sexual activity.  Eating habits.  History of falls.  Memory and ability to understand (cognition).  Work and work Statistician. What immunizations do I need?  Influenza (flu) vaccine  This is recommended every year. Tetanus, diphtheria, and pertussis (Tdap) vaccine  You may need a Td booster  every 10 years. Varicella (chickenpox) vaccine  You may need this vaccine if you have not already been vaccinated. Zoster (shingles) vaccine  You may need this after age 4. Pneumococcal conjugate (PCV13) vaccine  One dose is recommended after age 26. Pneumococcal polysaccharide (PPSV23) vaccine  One dose is recommended after age 99. Measles, mumps, and rubella (MMR) vaccine  You may need at least one dose of MMR if you were born in 1957 or later. You may also need a second dose. Meningococcal conjugate (MenACWY) vaccine  You may need this if you have certain conditions. Hepatitis A vaccine  You may need this if you have certain conditions or if you travel or work in places where you may be exposed to hepatitis A. Hepatitis B vaccine  You may need this if you have certain conditions or if you travel or work in places where you may be exposed to hepatitis B. Haemophilus influenzae type b (Hib) vaccine  You may need this if you have certain conditions. You may receive vaccines as individual doses or as more than one vaccine together in one shot (combination vaccines). Talk with your health care provider about the risks and benefits of combination vaccines. What tests do I need? Blood  tests  Lipid and cholesterol levels. These may be checked every 5 years, or more frequently depending on your overall health.  Hepatitis C test.  Hepatitis B test. Screening  Lung cancer screening. You may have this screening every year starting at age 23 if you have a 30-pack-year history of smoking and currently smoke or have quit within the past 15 years.  Colorectal cancer screening. All adults should have this screening starting at age 29 and continuing until age 73. Your health care provider may recommend screening at age 50 if you are at increased risk. You will have tests every 1-10 years, depending on your results and the type of screening test.  Prostate cancer screening. Recommendations will vary depending on your family history and other risks.  Diabetes screening. This is done by checking your blood sugar (glucose) after you have not eaten for a while (fasting). You may have this done every 1-3 years.  Abdominal aortic aneurysm (AAA) screening. You may need this if you are a current or former smoker.  Sexually transmitted disease (STD) testing. Follow these instructions at home: Eating and drinking  Eat a diet that includes fresh fruits and vegetables, whole grains, lean protein, and low-fat dairy products. Limit your intake of foods with high amounts of sugar, saturated fats, and salt.  Take vitamin and mineral supplements as recommended by your health care provider.  Do not drink alcohol if your health care provider tells you not to drink.  If you drink alcohol: ? Limit how much you have to 0-2 drinks a day. ? Be aware of how much alcohol is in your drink. In the [place] one drink equals one 12 oz bottle of beer (355 mL), one 5 oz glass of wine (148 mL), or one 1 oz glass of hard liquor (44 mL). Lifestyle  Take daily care of your teeth and gums.  Stay active. Exercise for at least 30 minutes on 5 or more days each week.  Do not use any products that contain  nicotine or tobacco, such as cigarettes, e-cigarettes, and chewing tobacco. If you need help quitting, ask your health care provider.  If you are sexually active, practice safe sex. Use a condom or other form of protection to prevent STIs (sexually  transmitted infections).  Talk with your health care provider about taking a low-dose aspirin or statin. What's next?  Visit your health care provider once a year for a well check visit.  Ask your health care provider how often you should have your eyes and teeth checked.  Stay up to date on all vaccines. This information is not intended to replace advice given to you by your health care provider. Make sure you discuss any questions you have with your health care provider. Document Revised: 11/26/2018 Document Reviewed: 11/26/2018 Elsevier Patient Education  2020 [place], MD [facility]

## 2020-12-13 NOTE — Patient Instructions (Signed)
-Nice seeing you today!!  -Lab work today; will notify you once results are available.  -Flu vaccine today.  -Remember to schedule your shingles vaccination series at your pharmacy.  -Schedule follow up in 6 months.   Preventive Care 22 Years and Older, Male Preventive care refers to lifestyle choices and visits with your health care provider that can promote health and wellness. This includes:  A yearly physical exam. This is also called an annual well check.  Regular dental and eye exams.  Immunizations.  Screening for certain conditions.  Healthy lifestyle choices, such as diet and exercise. What can I expect for my preventive care visit? Physical exam Your health care provider will check:  Height and weight. These may be used to calculate body mass index (BMI), which is a measurement that tells if you are at a healthy weight.  Heart rate and blood pressure.  Your skin for abnormal spots. Counseling Your health care provider may ask you questions about:  Alcohol, tobacco, and drug use.  Emotional well-being.  Home and relationship well-being.  Sexual activity.  Eating habits.  History of falls.  Memory and ability to understand (cognition).  Work and work Statistician. What immunizations do I need?  Influenza (flu) vaccine  This is recommended every year. Tetanus, diphtheria, and pertussis (Tdap) vaccine  You may need a Td booster every 10 years. Varicella (chickenpox) vaccine  You may need this vaccine if you have not already been vaccinated. Zoster (shingles) vaccine  You may need this after age 60. Pneumococcal conjugate (PCV13) vaccine  One dose is recommended after age 20. Pneumococcal polysaccharide (PPSV23) vaccine  One dose is recommended after age 38. Measles, mumps, and rubella (MMR) vaccine  You may need at least one dose of MMR if you were born in 1957 or later. You may also need a second dose. Meningococcal conjugate (MenACWY)  vaccine  You may need this if you have certain conditions. Hepatitis A vaccine  You may need this if you have certain conditions or if you travel or work in places where you may be exposed to hepatitis A. Hepatitis B vaccine  You may need this if you have certain conditions or if you travel or work in places where you may be exposed to hepatitis B. Haemophilus influenzae type b (Hib) vaccine  You may need this if you have certain conditions. You may receive vaccines as individual doses or as more than one vaccine together in one shot (combination vaccines). Talk with your health care provider about the risks and benefits of combination vaccines. What tests do I need? Blood tests  Lipid and cholesterol levels. These may be checked every 5 years, or more frequently depending on your overall health.  Hepatitis C test.  Hepatitis B test. Screening  Lung cancer screening. You may have this screening every year starting at age 85 if you have a 30-pack-year history of smoking and currently smoke or have quit within the past 15 years.  Colorectal cancer screening. All adults should have this screening starting at age 35 and continuing until age 4. Your health care provider may recommend screening at age 65 if you are at increased risk. You will have tests every 1-10 years, depending on your results and the type of screening test.  Prostate cancer screening. Recommendations will vary depending on your family history and other risks.  Diabetes screening. This is done by checking your blood sugar (glucose) after you have not eaten for a while (fasting). You may have  this done every 1-3 years.  Abdominal aortic aneurysm (AAA) screening. You may need this if you are a current or former smoker.  Sexually transmitted disease (STD) testing. Follow these instructions at home: Eating and drinking  Eat a diet that includes fresh fruits and vegetables, whole grains, lean protein, and low-fat dairy  products. Limit your intake of foods with high amounts of sugar, saturated fats, and salt.  Take vitamin and mineral supplements as recommended by your health care provider.  Do not drink alcohol if your health care provider tells you not to drink.  If you drink alcohol: ? Limit how much you have to 0-2 drinks a day. ? Be aware of how much alcohol is in your drink. In the [place] one drink equals one 12 oz bottle of beer (355 mL), one 5 oz glass of wine (148 mL), or one 1 oz glass of hard liquor (44 mL). Lifestyle  Take daily care of your teeth and gums.  Stay active. Exercise for at least 30 minutes on 5 or more days each week.  Do not use any products that contain nicotine or tobacco, such as cigarettes, e-cigarettes, and chewing tobacco. If you need help quitting, ask your health care provider.  If you are sexually active, practice safe sex. Use a condom or other form of protection to prevent STIs (sexually transmitted infections).  Talk with your health care provider about taking a low-dose aspirin or statin. What's next?  Visit your health care provider once a year for a well check visit.  Ask your health care provider how often you should have your eyes and teeth checked.  Stay up to date on all vaccines. This information is not intended to replace advice given to you by your health care provider. Make sure you discuss any questions you have with your health care provider. Document Revised: 11/26/2018 Document Reviewed: 11/26/2018 Elsevier Patient Education  2020 [employer].

## 2020-12-13 NOTE — Addendum Note (Signed)
Addended by: Lerry Liner on: 12/13/2020 12:12 PM   Modules accepted: Orders

## 2020-12-13 NOTE — Addendum Note (Signed)
Addended by: Lerry Liner on: 12/13/2020 12:11 PM   Modules accepted: Orders

## 2020-12-13 NOTE — Addendum Note (Signed)
Addended by: Kern Reap B on: 12/13/2020 04:06 PM   Modules accepted: Orders

## 2021-01-08 ENCOUNTER — Other Ambulatory Visit: Payer: Self-pay

## 2021-01-10 ENCOUNTER — Other Ambulatory Visit: Payer: Self-pay

## 2021-01-10 ENCOUNTER — Ambulatory Visit (INDEPENDENT_AMBULATORY_CARE_PROVIDER_SITE_OTHER): Payer: Medicare HMO | Admitting: Endocrinology

## 2021-01-10 VITALS — BP 110/74 | Ht 74.5 in | Wt 207.6 lb

## 2021-01-10 DIAGNOSIS — E108 Type 1 diabetes mellitus with unspecified complications: Secondary | ICD-10-CM

## 2021-01-10 DIAGNOSIS — Z794 Long term (current) use of insulin: Secondary | ICD-10-CM

## 2021-01-10 DIAGNOSIS — E1122 Type 2 diabetes mellitus with diabetic chronic kidney disease: Secondary | ICD-10-CM | POA: Diagnosis not present

## 2021-01-10 DIAGNOSIS — E1142 Type 2 diabetes mellitus with diabetic polyneuropathy: Secondary | ICD-10-CM

## 2021-01-10 DIAGNOSIS — N189 Chronic kidney disease, unspecified: Secondary | ICD-10-CM

## 2021-01-10 LAB — POCT GLYCOSYLATED HEMOGLOBIN (HGB A1C): Hemoglobin A1C: 8.9 % — AB (ref 4.0–5.6)

## 2021-01-10 MED ORDER — INSULIN ASPART 100 UNIT/ML ~~LOC~~ SOLN: 10.0000 [IU] | Freq: Every day | SUBCUTANEOUS | 11 refills | Status: DC

## 2021-01-10 NOTE — Patient Instructions (Addendum)
check your blood sugar twice a day.  vary the time of day when you check, between before the 3 meals, and at bedtime.  also check if you have symptoms of your blood sugar being too high or too low.  please keep a record of the readings and bring it to your next appointment here (or you can bring the meter itself).  You can write it on any piece of paper.  please call us sooner if your blood sugar goes below 70, or if you have a lot of readings over 200.   Please change the insulins to the numbers listed below.   On this type of insulin schedule, you should eat meals on a regular schedule.  If a meal is missed or significantly delayed, your blood sugar could go low.   You should also eat a snack at bedtime, to avoid the sugar going low in the early morning.   Please come back for a follow-up appointment in 2 months.

## 2021-01-10 NOTE — Progress Notes (Signed)
Subjective:    Patient ID: Christopher [name], male    DOB: 04-01-1946, 75 y.o.   MRN: [id number]  HPI Pt returns for f/u of diabetes mellitus:  DM type: Insulin-requiring type 2 (but he may be evolving type 1).   Dx'ed: 2010.   Complications: PN and CRI.   Therapy: insulin since 2013.   DKA: never.   Severe hypoglycemia: last episode was in 2018.   Pancreatitis: never.  SDOH: he uses syringe and vial, after difficulty operating pen; he is back on 2 QD insulins, after noncompliance with multiple daily injections.   Other: he could not take lantus, due to AM hypoglycemia and PM hyperglycemia; however, he changed back after poor results with faster-acting qd insulin; He declines V-GO.   Interval history: I reviewed continuous glucose monitor data.  Glucose varies from 40-400.  It is still in general highest after breakfast.  pt states he feels well in general. He says he does not miss any insulin doses.  Pt reports hypoglycemia approx once per week.  This usually happens fasting or with activity.  He takes levemir 63 units qam, and novolog 8 units with breakfast.   Past Medical History:  Diagnosis Date  . Diabetes mellitus type II   . GERD (gastroesophageal reflux disease)   . Glaucoma   . Hyperlipidemia   . Hypertension     Past Surgical History:  Procedure Laterality Date  . COLONOSCOPY    . HERNIA REPAIR    . [place]    Social History   Socioeconomic History  . Marital status: Married    Spouse name: Not on file  . Number of children: Not on file  . Years of education: Not on file  . Highest education level: Not on file  Occupational History  . Not on file  Tobacco Use  . Smoking status: Never Smoker  . Smokeless tobacco: Never Used  Vaping Use  . Vaping Use: Never used  Substance and Sexual Activity  . Alcohol use: No  . Drug use: No  . Sexual activity: Not on file  Other Topics Concern  . Not on file  Social History Narrative  . Not on  file   Social Determinants of Health   Financial Resource Strain: Not on file  Food Insecurity: Not on file  Transportation Needs: No Transportation Needs  . Lack of Transportation (Medical): No  . Lack of Transportation (Non-Medical): No  Physical Activity: Sufficiently Active  . Days of Exercise per Week: 5 days  . Minutes of Exercise per Session: 60 min  Stress: Not on file  Social Connections: Not on file  Intimate Partner Violence: Not on file    Current Outpatient Medications on File Prior to Visit  Medication Sig Dispense Refill  . Alcohol Swabs (B-D SINGLE USE SWABS REGULAR) PADS USE TO CHECK BLOOD SUGAR 2 TIMES PER DAY 200 each prn  . ALPHAGAN P 0.1 % SOLN Place 1 drop into both eyes daily.     Marland Kitchen aspirin EC 81 MG EC tablet Take 81 mg by mouth daily.    Marland Kitchen atorvastatin (LIPITOR) 80 MG tablet Take 1 tablet by mouth once daily 90 tablet 1  . BD VEO INSULIN SYRINGE U/F 31G X 15/64" 1 ML MISC USE 1 SYRINGE TWICE DAILY 200 each 0  . CINNAMON PO Take 100 mg by mouth daily.    . Coenzyme Q10 (CO Q10) 100 MG CAPS Take by mouth.    . Continuous  Blood Gluc Receiver (FREESTYLE LIBRE 14 DAY READER) DEVI 1 Device by Other route See admin instructions. 1 Device 0  . Continuous Blood Gluc Sensor (FREESTYLE LIBRE 14 DAY SENSOR) MISC 1 Device by Other route every 14 (fourteen) days. USE 1  EVERY TWO WEEKS 6 each 3  . dorzolamide-timolol (COSOPT) 22.3-6.8 MG/ML ophthalmic solution Place 1 drop into both eyes 2 (two) times daily.     Marland Kitchen ezetimibe (ZETIA) 10 MG tablet Take 1 tablet (10 mg total) by mouth daily. 90 tablet 1  . glucagon 1 MG injection Inject 1 mg into the muscle once as needed. 1 each 12  . hydrochlorothiazide (HYDRODIURIL) 25 MG tablet Take 1 tablet by mouth once daily 90 tablet 1  . insulin detemir (LEVEMIR) 100 UNIT/ML injection Inject 0.6 mLs (60 Units total) into the skin every morning. And syringes 1/day 30 mL 11  . Insulin Disposable Pump (V-GO 20) KIT 1 Device by Does not  apply route daily. 90 kit 3  . lisinopril (ZESTRIL) 2.5 MG tablet TAKE 1 TABLET BY MOUTH ONCE DAILY(SCHEULE ANNUAL OFFICE EXAM) 90 tablet 0  . Multiple Vitamins-Minerals (CENTRUM SILVER ULTRA MENS PO) Take 1 tablet by mouth daily.    . sildenafil (REVATIO) 20 MG tablet Take 1 to 2 tabs 2 - 3 hours before sex 20 tablet 11   No current facility-administered medications on file prior to visit.    No Known Allergies  Family History  Problem Relation Age of Onset  . Alcohol abuse Father   . Diabetes Sister   . Colon cancer Neg Hx   . Esophageal cancer Neg Hx   . Stomach cancer Neg Hx   . Rectal cancer Neg Hx     BP 110/74 (BP Location: Right Arm, Patient Position: Sitting, Cuff Size: Normal)   Ht 6' 2.5" (1.892 m)   Wt 207 lb 9.6 oz (94.2 kg)   BMI 26.30 kg/m    Review of Systems Denies LOC    Objective:   Physical Exam VITAL SIGNS:  See vs page GENERAL: no distress Pulses: dorsalis pedis intact bilat.   MSK: no deformity of the feet CV: no leg edema Skin:  no ulcer on the feet.  normal color and temp on the feet. Neuro: sensation is intact to touch on the feet  Lab Results  Component Value Date   HGBA1C 8.9 (A) 01/10/2021        Assessment & Plan:  Insulin-requiring type 2 DM: uncontrolled.  Hypoglycemia, due to insulin: this limits aggressiveness of glycemic control.  Addressing this is the next step in DM mgmt.    Patient Instructions  check your blood sugar twice a day.  vary the time of day when you check, between before the 3 meals, and at bedtime.  also check if you have symptoms of your blood sugar being too high or too low.  please keep a record of the readings and bring it to your next appointment here (or you can bring the meter itself).  You can write it on any piece of paper.  please call us sooner if your blood sugar goes below 70, or if you have a lot of readings over 200.   Please change the insulins to the numbers listed below.   On this type of  insulin schedule, you should eat meals on a regular schedule.  If a meal is missed or significantly delayed, your blood sugar could go low.   You should also eat a snack at bedtime,  to avoid the sugar going low in the early morning.   Please come back for a follow-up appointment in 2 months.

## 2021-02-16 ENCOUNTER — Other Ambulatory Visit: Payer: Self-pay | Admitting: Endocrinology

## 2021-02-16 DIAGNOSIS — E108 Type 1 diabetes mellitus with unspecified complications: Secondary | ICD-10-CM

## 2021-03-14 ENCOUNTER — Ambulatory Visit: Payer: Medicare HMO | Admitting: Endocrinology

## 2021-04-06 ENCOUNTER — Other Ambulatory Visit: Payer: Self-pay | Admitting: Endocrinology

## 2021-04-13 ENCOUNTER — Ambulatory Visit: Payer: Medicare HMO

## 2021-04-13 ENCOUNTER — Other Ambulatory Visit (HOSPITAL_BASED_OUTPATIENT_CLINIC_OR_DEPARTMENT_OTHER): Payer: Self-pay

## 2021-04-18 ENCOUNTER — Other Ambulatory Visit (HOSPITAL_BASED_OUTPATIENT_CLINIC_OR_DEPARTMENT_OTHER): Payer: Self-pay

## 2021-04-18 ENCOUNTER — Ambulatory Visit: Payer: Medicare HMO | Attending: Internal Medicine

## 2021-04-18 ENCOUNTER — Other Ambulatory Visit: Payer: Self-pay

## 2021-04-18 ENCOUNTER — Ambulatory Visit (INDEPENDENT_AMBULATORY_CARE_PROVIDER_SITE_OTHER): Payer: Medicare HMO | Admitting: Endocrinology

## 2021-04-18 VITALS — BP 120/64 | HR 71 | Ht 74.5 in | Wt 198.6 lb

## 2021-04-18 DIAGNOSIS — E108 Type 1 diabetes mellitus with unspecified complications: Secondary | ICD-10-CM

## 2021-04-18 DIAGNOSIS — Z23 Encounter for immunization: Secondary | ICD-10-CM

## 2021-04-18 LAB — POCT GLYCOSYLATED HEMOGLOBIN (HGB A1C): Hemoglobin A1C: 10.1 % — AB (ref 4.0–5.6)

## 2021-04-18 MED ORDER — PFIZER-BIONT COVID-19 VAC-TRIS 30 MCG/0.3ML IM SUSP
INTRAMUSCULAR | 0 refills | Status: DC
  Filled 2021-04-18: qty 0.3, 1d supply, fill #0

## 2021-04-18 NOTE — Progress Notes (Signed)
Subjective:    Patient ID: Christopher [name], male    DOB: 1946/06/03, 75 y.o.   MRN: [id number]  HPI Pt returns for f/u of diabetes mellitus:  DM type: Insulin-requiring type 2 (but he may be evolving type 1).   Dx'ed: 2010.   Complications: PN and CRI.   Therapy: insulin since 2013.   DKA: never.   Severe hypoglycemia: last episode was in 2018.   Pancreatitis: never.  SDOH: he uses syringe and vial, after difficulty operating pen; he is back on 2 QD insulins, after noncompliance with multiple daily injections.   Other: he could not take lantus, due to AM hypoglycemia and PM hyperglycemia; however, he changed back after poor results with faster-acting qd insulin; He declines V-GO.  He eats meals at 7AM, 1PM, and 6PM.   Interval history: I reviewed continuous glucose monitor data.  Glucose varies from 45-340.  It is in general higher at 10AM than at 1-6AMpt states he feels well in general. He says he does not miss any insulin doses.  Pt has hypoglycemia approx once per week.  This usually happens before lunch.  He takes levemir 66 units qam, and novolog 8 units with breakfast.  Past Medical History:  Diagnosis Date  . Diabetes mellitus type II   . GERD (gastroesophageal reflux disease)   . Glaucoma   . Hyperlipidemia   . Hypertension     Past Surgical History:  Procedure Laterality Date  . COLONOSCOPY    . HERNIA REPAIR    . [place]    Social History   Socioeconomic History  . Marital status: Married    Spouse name: Not on file  . Number of children: Not on file  . Years of education: Not on file  . Highest education level: Not on file  Occupational History  . Not on file  Tobacco Use  . Smoking status: Never Smoker  . Smokeless tobacco: Never Used  Vaping Use  . Vaping Use: Never used  Substance and Sexual Activity  . Alcohol use: No  . Drug use: No  . Sexual activity: Not on file  Other Topics Concern  . Not on file  Social History  Narrative  . Not on file   Social Determinants of Health   Financial Resource Strain: Not on file  Food Insecurity: Not on file  Transportation Needs: No Transportation Needs  . Lack of Transportation (Medical): No  . Lack of Transportation (Non-Medical): No  Physical Activity: Sufficiently Active  . Days of Exercise per Week: 5 days  . Minutes of Exercise per Session: 60 min  Stress: Not on file  Social Connections: Not on file  Intimate Partner Violence: Not on file    Current Outpatient Medications on File Prior to Visit  Medication Sig Dispense Refill  . Alcohol Swabs (B-D SINGLE USE SWABS REGULAR) PADS USE TO CHECK BLOOD SUGAR 2 TIMES PER DAY 200 each prn  . ALPHAGAN P 0.1 % SOLN Place 1 drop into both eyes daily.     Marland Kitchen aspirin EC 81 MG EC tablet Take 81 mg by mouth daily.    Marland Kitchen atorvastatin (LIPITOR) 80 MG tablet Take 1 tablet by mouth once daily 90 tablet 1  . BD VEO INSULIN SYRINGE U/F 31G X 15/64" 1 ML MISC USE  SYRINGE TWICE DAILY 200 each 0  . CINNAMON PO Take 100 mg by mouth daily.    . Coenzyme Q10 (CO Q10) 100 MG CAPS Take by  mouth.    . Continuous Blood Gluc Receiver (FREESTYLE LIBRE 14 DAY READER) DEVI 1 Device by Other route See admin instructions. 1 Device 0  . Continuous Blood Gluc Sensor (FREESTYLE LIBRE 14 DAY SENSOR) MISC 1 Device by Other route every 14 (fourteen) days. USE 1  EVERY TWO WEEKS 6 each 3  . dorzolamide-timolol (COSOPT) 22.3-6.8 MG/ML ophthalmic solution Place 1 drop into both eyes 2 (two) times daily.     Marland Kitchen ezetimibe (ZETIA) 10 MG tablet Take 1 tablet (10 mg total) by mouth daily. 90 tablet 1  . glucagon 1 MG injection Inject 1 mg into the muscle once as needed. 1 each 12  . hydrochlorothiazide (HYDRODIURIL) 25 MG tablet Take 1 tablet by mouth once daily 90 tablet 1  . insulin aspart (NOVOLOG) 100 UNIT/ML injection Inject 10 Units into the skin daily with breakfast. 10 mL 11  . Insulin Disposable Pump (V-GO 20) KIT 1 Device by Does not apply route  daily. 90 kit 3  . LEVEMIR 100 UNIT/ML injection INJECT 80 UNITS SUBCUTANEOUSLY ONCE DAILY IN THE MORNING 30 mL 5  . lisinopril (ZESTRIL) 2.5 MG tablet TAKE 1 TABLET BY MOUTH ONCE DAILY(SCHEULE ANNUAL OFFICE EXAM) 90 tablet 0  . Multiple Vitamins-Minerals (CENTRUM SILVER ULTRA MENS PO) Take 1 tablet by mouth daily.    . sildenafil (REVATIO) 20 MG tablet Take 1 to 2 tabs 2 - 3 hours before sex 20 tablet 11   No current facility-administered medications on file prior to visit.    No Known Allergies  Family History  Problem Relation Age of Onset  . Alcohol abuse Father   . Diabetes Sister   . Colon cancer Neg Hx   . Esophageal cancer Neg Hx   . Stomach cancer Neg Hx   . Rectal cancer Neg Hx     BP 120/64 (BP Location: Right Arm, Patient Position: Sitting, Cuff Size: Normal)   Pulse 71   Ht 6' 2.5" (1.892 m)   Wt 198 lb 9.6 oz (90.1 kg)   SpO2 98%   BMI 25.16 kg/m    Review of Systems     Objective:   Physical Exam VITAL SIGNS:  See vs page GENERAL: no distress Pulses: dorsalis pedis intact bilat.   MSK: no deformity of the feet CV: no leg edema Skin:  no ulcer on the feet.  normal color and temp on the feet. Neuro: sensation is intact to touch on the feet  Lab Results  Component Value Date   HGBA1C 10.1 (A) 04/18/2021       Assessment & Plan:  Insulin-requiring type 2 DM: uncontrolled.    Hypoglycemia, due to insulin: The pattern of his cbg's indicates he needs some adjustment in his therapy.    Patient Instructions  check your blood sugar twice a day.  vary the time of day when you check, between before the 3 meals, and at bedtime.  also check if you have symptoms of your blood sugar being too high or too low.  please keep a record of the readings and bring it to your next appointment here (or you can bring the meter itself).  You can write it on any piece of paper.  please call us sooner if your blood sugar goes below 70, or if you have a lot of readings over 200.    Please change the insulins to the numbers listed below.   On this type of insulin schedule, you should eat meals on a regular schedule.  In particular, you should eat lunch by noon.  If a meal is missed or significantly delayed, your blood sugar could go low.   You should also eat a snack at bedtime, to avoid the sugar going low in the early morning.   Please come back for a follow-up appointment in 2 months.

## 2021-04-18 NOTE — Progress Notes (Signed)
   Covid-19 Vaccination [facility]  Name:  Christopher [name]    MRN: [id number] DOB: [birth year]  04/18/2021  [name] was observed post Covid-19 immunization for 15 minutes without incident. He was provided with Vaccine Information Sheet and instruction to access the V-Safe system.   [name] was instructed to call 911 with any severe reactions post vaccine: Marland Kitchen Difficulty breathing  . Swelling of face and throat  . A fast heartbeat  . A bad rash all over body  . Dizziness and weakness   Immunizations Administered    Name Date Dose VIS Date Route   PFIZER Comrnaty(Gray TOP) Covid-19 Vaccine 04/18/2021  3:35 PM 0.3 mL 11/23/2020 Intramuscular   Manufacturer: [place]   Lot: [id number]   NDC: [phone]

## 2021-04-18 NOTE — Patient Instructions (Addendum)
check your blood sugar twice a day.  vary the time of day when you check, between before the 3 meals, and at bedtime.  also check if you have symptoms of your blood sugar being too high or too low.  please keep a record of the readings and bring it to your next appointment here (or you can bring the meter itself).  You can write it on any piece of paper.  please call us sooner if your blood sugar goes below 70, or if you have a lot of readings over 200.   Please change the insulins to the numbers listed below.   On this type of insulin schedule, you should eat meals on a regular schedule.  In particular, you should eat lunch by noon.  If a meal is missed or significantly delayed, your blood sugar could go low.   You should also eat a snack at bedtime, to avoid the sugar going low in the early morning.   Please come back for a follow-up appointment in 2 months.

## 2021-04-26 DIAGNOSIS — Z03818 Encounter for observation for suspected exposure to other biological agents ruled out: Secondary | ICD-10-CM | POA: Diagnosis not present

## 2021-05-18 ENCOUNTER — Emergency Department (HOSPITAL_BASED_OUTPATIENT_CLINIC_OR_DEPARTMENT_OTHER)
Admission: EM | Admit: 2021-05-18 | Discharge: 2021-05-18 | Disposition: A | Discharge status: Home / Self Care | Payer: Medicare HMO | Attending: Emergency Medicine | Admitting: Emergency Medicine

## 2021-05-18 ENCOUNTER — Other Ambulatory Visit: Payer: Self-pay

## 2021-05-18 ENCOUNTER — Encounter (HOSPITAL_BASED_OUTPATIENT_CLINIC_OR_DEPARTMENT_OTHER): Payer: Self-pay | Admitting: Emergency Medicine

## 2021-05-18 DIAGNOSIS — Z794 Long term (current) use of insulin: Secondary | ICD-10-CM | POA: Diagnosis not present

## 2021-05-18 DIAGNOSIS — E119 Type 2 diabetes mellitus without complications: Secondary | ICD-10-CM | POA: Diagnosis not present

## 2021-05-18 DIAGNOSIS — I10 Essential (primary) hypertension: Secondary | ICD-10-CM | POA: Diagnosis not present

## 2021-05-18 DIAGNOSIS — Z7982 Long term (current) use of aspirin: Secondary | ICD-10-CM | POA: Insufficient documentation

## 2021-05-18 DIAGNOSIS — Z79899 Other long term (current) drug therapy: Secondary | ICD-10-CM | POA: Insufficient documentation

## 2021-05-18 DIAGNOSIS — L03113 Cellulitis of right upper limb: Secondary | ICD-10-CM | POA: Diagnosis not present

## 2021-05-18 DIAGNOSIS — R21 Rash and other nonspecific skin eruption: Secondary | ICD-10-CM | POA: Diagnosis present

## 2021-05-18 MED ORDER — CEPHALEXIN 500 MG PO CAPS: 500.0000 mg | ORAL_CAPSULE | Freq: Three times a day (TID) | ORAL | 0 refills | Status: DC

## 2021-05-18 MED ORDER — DOXYCYCLINE HYCLATE 100 MG PO CAPS: 100.0000 mg | ORAL_CAPSULE | Freq: Two times a day (BID) | ORAL | 0 refills | Status: DC

## 2021-05-18 NOTE — Discharge Instructions (Signed)
Take the antibiotics prescribed.  Please return to the ER if your rash, swelling is getting worse. If despite the antibiotics, there is no resolution of symptoms, then see your primary care doctor for ultrasound or come back to the ER for [place] to order an ultrasound.

## 2021-05-18 NOTE — ED Triage Notes (Signed)
Right anterior elbow red itching rash. X 3 days . Worked in yard 1 day prior to rash. No further symptoms.

## 2021-05-18 NOTE — ED Provider Notes (Signed)
[facility]ERGENC[facility] DEPT Provider Note   CSN: [id number] Arrival date & time: 05/18/21  1310     History Chief Complaint  Patient presents with  . Rash    Christopher [name] is a 75 y.o. male.  HPI    75 year old male comes in with chief complaint of rash.  Patient is diabetic  Past Medical History:  Diagnosis Date  . Diabetes mellitus type II   . GERD (gastroesophageal reflux disease)   . Glaucoma   . Hyperlipidemia   . Hypertension     Patient Active Problem List   Diagnosis Date Noted  . Routine general medical examination at a health care facility 08/26/2018  . Encounter for immunization 08/26/2018  . Type 1 diabetes mellitus with unspecified complications ([place]) 55/73/2202  . Screening for cardiovascular condition 03/31/2017  . URINARY FREQUENCY 02/06/2009  . VENOUS INSUFFICIENCY 05/30/2008  . Hyperlipidemia 04/15/2008  . Glaucoma 04/15/2008  . GERD 04/15/2008    Past Surgical History:  Procedure Laterality Date  . COLONOSCOPY    . HERNIA REPAIR    . PATELLA FRACTURE SURGERY  1993       Family History  Problem Relation Age of Onset  . Alcohol abuse Father   . Diabetes Sister   . Colon cancer Neg Hx   . Esophageal cancer Neg Hx   . Stomach cancer Neg Hx   . Rectal cancer Neg Hx     Social History   Tobacco Use  . Smoking status: Never Smoker  . Smokeless tobacco: Never Used  Vaping Use  . Vaping Use: Never used  Substance Use Topics  . Alcohol use: No  . Drug use: No    Home Medications Prior to Admission medications   Medication Sig Start Date End Date Taking? Authorizing Provider  cephALEXin (KEFLEX) 500 MG capsule Take 1 capsule (500 mg total) by mouth 3 (three) times daily. 05/18/21  Yes Varney Biles, MD  doxycycline (VIBRAMYCIN) 100 MG capsule Take 1 capsule (100 mg total) by mouth 2 (two) times daily. 05/18/21  Yes Varney Biles, MD  Alcohol Swabs (B-D SINGLE USE SWABS REGULAR) PADS USE TO CHECK BLOOD SUGAR 2 TIMES  PER DAY 01/07/18   Renato Shin, MD  ALPHAGAN P 0.1 % SOLN Place 1 drop into both eyes daily.  03/03/11   [provider]  aspirin EC 81 MG EC tablet Take 81 mg by mouth daily.    [provider]  atorvastatin (LIPITOR) 80 MG tablet Take 1 tablet by mouth once daily 10/30/20   Isaac Bliss, Rayford Halsted, MD  BD VEO INSULIN SYRINGE U/F 31G X 15/64" 1 ML MISC USE  SYRINGE TWICE DAILY 02/17/21   Renato Shin, MD  CINNAMON PO Take 100 mg by mouth daily.    [provider]  Coenzyme Q10 (CO Q10) 100 MG CAPS Take by mouth.    [provider]  Continuous Blood Gluc Receiver (FREESTYLE LIBRE 14 DAY READER) DEVI 1 Device by Other route See admin instructions. 10/12/18   Renato Shin, MD  Continuous Blood Gluc Sensor (FREESTYLE LIBRE 14 DAY SENSOR) MISC 1 Device by Other route every 14 (fourteen) days. USE 1  EVERY TWO WEEKS 10/25/20   Renato Shin, MD  COVID-19 mRNA Vac-TriS, Pfizer, (PFIZER-BIONT COVID-19 VAC-TRIS) SUSP injection Inject into the muscle. 04/18/21   Carlyle Basques, MD  dorzolamide-timolol (COSOPT) 22.3-6.8 MG/ML ophthalmic solution Place 1 drop into both eyes 2 (two) times daily.  03/03/11   [provider]  ezetimibe (ZETIA)  10 MG tablet Take 1 tablet (10 mg total) by mouth daily. 06/08/20   Isaac Bliss, Rayford Halsted, MD  glucagon 1 MG injection Inject 1 mg into the muscle once as needed. 03/12/16   Renato Shin, MD  hydrochlorothiazide (HYDRODIURIL) 25 MG tablet Take 1 tablet by mouth once daily 10/30/20   Isaac Bliss, Rayford Halsted, MD  insulin aspart (NOVOLOG) 100 UNIT/ML injection Inject 10 Units into the skin daily with breakfast. 01/10/21   Renato Shin, MD  Insulin Disposable Pump (V-GO 20) KIT 1 Device by Does not apply route daily. 10/30/20   Renato Shin, MD  LEVEMIR 100 UNIT/ML injection INJECT 80 UNITS SUBCUTANEOUSLY ONCE DAILY IN THE MORNING 04/06/21   Renato Shin, MD  lisinopril (ZESTRIL) 2.5 MG tablet TAKE 1 TABLET BY MOUTH ONCE  DAILY(SCHEULE ANNUAL OFFICE EXAM) 12/01/20   Isaac Bliss, Rayford Halsted, MD  Multiple Vitamins-Minerals (CENTRUM SILVER ULTRA MENS PO) Take 1 tablet by mouth daily.    [provider]  sildenafil (REVATIO) 20 MG tablet Take 1 to 2 tabs 2 - 3 hours before sex 10/06/19   Isaac Bliss, Rayford Halsted, MD    Allergies    Patient has no known allergies.  Review of Systems   Review of Systems  Constitutional: Positive for activity change.  Gastrointestinal: Negative for nausea and vomiting.  Skin: Positive for rash.  Allergic/Immunologic: Positive for immunocompromised state.  Hematological: Does not bruise/bleed easily.    Physical Exam Updated Vital Signs BP 132/88 (BP Location: Right Arm)   Pulse 68   Temp 98 F (36.7 C) (Oral)   Resp 18   Ht 6' 2"  (1.88 m)   Wt 90.3 kg   SpO2 99%   BMI 25.55 kg/m   Physical Exam Vitals and nursing note reviewed.  Constitutional:      Appearance: He is well-developed.  HENT:     Head: Atraumatic.  Cardiovascular:     Rate and Rhythm: Normal rate.  Pulmonary:     Effort: Pulmonary effort is normal.  Musculoskeletal:     Cervical back: Neck supple.  Skin:    General: Skin is warm.     Findings: Erythema and rash present.     Comments: Erythema, edema, slight warmth to touch  Neurological:     Mental Status: He is alert and oriented to person, place, and time.         ED Results / Procedures / Treatments   Labs (all labs ordered are listed, but only abnormal results are displayed) Labs Reviewed - No data to display  EKG None  Radiology No results found.  Procedures Procedures   Medications Ordered in ED Medications - No data to display  ED Course  I have reviewed the triage vital signs and the nursing notes.  Pertinent labs & imaging results that were available during my care of the patient were reviewed by me and considered in my medical decision making (see chart for details).    MDM  Rules/Calculators/A&P                          Pt comes in with cc of rash. Has erythema, warmth to touch over the R antecubital fossa. No iv recently. No signs of abscess.  No risk factors for DVT. Superficial phlebitis possible.  Pt wants antibiotics now and if not better, he will seek [place]. He will return to the ER if the symptoms get worse.  Final Clinical Impression(s) / ED  Diagnoses Final diagnoses:  Cellulitis of right upper extremity    Rx / DC Orders ED Discharge Orders         Ordered    cephALEXin (KEFLEX) 500 MG capsule  3 times daily        05/18/21 1520    doxycycline (VIBRAMYCIN) 100 MG capsule  2 times daily        05/18/21 1520           Varney Biles, MD 05/18/21 1550

## 2021-05-24 ENCOUNTER — Other Ambulatory Visit: Payer: Self-pay | Admitting: Internal Medicine

## 2021-05-24 DIAGNOSIS — Z0000 Encounter for general adult medical examination without abnormal findings: Secondary | ICD-10-CM

## 2021-06-05 ENCOUNTER — Other Ambulatory Visit: Payer: Self-pay | Admitting: Endocrinology

## 2021-06-05 DIAGNOSIS — E108 Type 1 diabetes mellitus with unspecified complications: Secondary | ICD-10-CM

## 2021-06-07 NOTE — Telephone Encounter (Signed)
Patient called to advise that he only has 1 syringe left Is requesting refill be sent to requesting pharmacy. Original request sent by pharmacy 06/04/21

## 2021-06-12 ENCOUNTER — Ambulatory Visit (INDEPENDENT_AMBULATORY_CARE_PROVIDER_SITE_OTHER): Payer: Medicare HMO | Admitting: Internal Medicine

## 2021-06-12 ENCOUNTER — Other Ambulatory Visit: Payer: Self-pay

## 2021-06-12 ENCOUNTER — Encounter: Payer: Self-pay | Admitting: Internal Medicine

## 2021-06-12 VITALS — BP 118/78 | HR 69 | Temp 97.9°F | Ht 74.0 in | Wt 195.5 lb

## 2021-06-12 DIAGNOSIS — E108 Type 1 diabetes mellitus with unspecified complications: Secondary | ICD-10-CM

## 2021-06-12 DIAGNOSIS — H409 Unspecified glaucoma: Secondary | ICD-10-CM | POA: Diagnosis not present

## 2021-06-12 DIAGNOSIS — E785 Hyperlipidemia, unspecified: Secondary | ICD-10-CM

## 2021-06-12 DIAGNOSIS — E1159 Type 2 diabetes mellitus with other circulatory complications: Secondary | ICD-10-CM

## 2021-06-12 DIAGNOSIS — I152 Hypertension secondary to endocrine disorders: Secondary | ICD-10-CM

## 2021-06-12 DIAGNOSIS — K219 Gastro-esophageal reflux disease without esophagitis: Secondary | ICD-10-CM | POA: Diagnosis not present

## 2021-06-12 NOTE — Progress Notes (Signed)
Established Patient Office Visit     This visit occurred during the SARS-CoV-2 public health emergency.  Safety protocols were in place, including screening questions prior to the visit, additional usage of staff PPE, and extensive cleaning of exam room while observing appropriate contact time as indicated for disinfecting solutions.    CC/Reason for Visit: 38-monthfollow-up medical conditions  HPI: Christopher RICCIUTIis a 75y.o. male who is coming in today for the above mentioned reasons. Past Medical History is significant for: Hypertension, insulin-dependent diabetes that has been uncontrolled followed by endocrinology, hyperlipidemia, glaucoma, GERD.  He has been doing well and has no acute complaints.  He did get his second COVID booster.   Past Medical/Surgical History: Past Medical History:  Diagnosis Date   Diabetes mellitus type II    GERD (gastroesophageal reflux disease)    Glaucoma    Hyperlipidemia    Hypertension     Past Surgical History:  Procedure Laterality Date   COLONOSCOPY     HERNIA REPAIR     PATELLA FRACTURE SURGERY  1993    Social History:  reports that he has never smoked. He has never used smokeless tobacco. He reports that he does not drink alcohol and does not use drugs.  Allergies: No Known Allergies  Family History:  Family History  Problem Relation Age of Onset   Alcohol abuse Father    Diabetes Sister    Colon cancer Neg Hx    Esophageal cancer Neg Hx    Stomach cancer Neg Hx    Rectal cancer Neg Hx      Current Outpatient Medications:    ALPHAGAN P 0.1 % SOLN, Place 1 drop into both eyes daily. , Disp: , Rfl:    aspirin EC 81 MG EC tablet, Take 81 mg by mouth daily., Disp: , Rfl:    Coenzyme Q10 (CO Q10) 100 MG CAPS, Take by mouth., Disp: , Rfl:    Continuous Blood Gluc Receiver (FREESTYLE LIBRE 14 DAY READER) DEVI, 1 Device by Other route See admin instructions., Disp: 1 Device, Rfl: 0   Continuous Blood Gluc Sensor  (FREESTYLE LIBRE 14 DAY SENSOR) MISC, 1 Device by Other route every 14 (fourteen) days. USE 1  EVERY TWO WEEKS, Disp: 6 each, Rfl: 3   ezetimibe (ZETIA) 10 MG tablet, Take 1 tablet (10 mg total) by mouth daily., Disp: 90 tablet, Rfl: 1   hydrochlorothiazide (HYDRODIURIL) 25 MG tablet, Take 1 tablet by mouth once daily, Disp: 90 tablet, Rfl: 1   insulin aspart (NOVOLOG) 100 UNIT/ML injection, Inject 10 Units into the skin daily with breakfast., Disp: 10 mL, Rfl: 11   Multiple Vitamins-Minerals (CENTRUM SILVER ULTRA MENS PO), Take 1 tablet by mouth daily., Disp: , Rfl:    Alcohol Swabs (B-D SINGLE USE SWABS REGULAR) PADS, USE TO CHECK BLOOD SUGAR 2 TIMES PER DAY, Disp: 200 each, Rfl: prn   atorvastatin (LIPITOR) 80 MG tablet, Take 1 tablet by mouth once daily, Disp: 90 tablet, Rfl: 1   BD VEO INSULIN SYRINGE U/F 31G X 15/64" 1 ML MISC, USE  SYRINGE TWICE DAILY, Disp: 200 each, Rfl: 0   cephALEXin (KEFLEX) 500 MG capsule, Take 1 capsule (500 mg total) by mouth 3 (three) times daily., Disp: 21 capsule, Rfl: 0   CINNAMON PO, Take 100 mg by mouth daily., Disp: , Rfl:    COVID-19 mRNA Vac-TriS, Pfizer, (PFIZER-BIONT COVID-19 VAC-TRIS) SUSP injection, Inject into the muscle., Disp: 0.3 mL, Rfl: 0  dorzolamide-timolol (COSOPT) 22.3-6.8 MG/ML ophthalmic solution, Place 1 drop into both eyes 2 (two) times daily. , Disp: , Rfl:    doxycycline (VIBRAMYCIN) 100 MG capsule, Take 1 capsule (100 mg total) by mouth 2 (two) times daily., Disp: 14 capsule, Rfl: 0   glucagon 1 MG injection, Inject 1 mg into the muscle once as needed., Disp: 1 each, Rfl: 12   Insulin Disposable Pump (V-GO 20) KIT, 1 Device by Does not apply route daily., Disp: 90 kit, Rfl: 3   LEVEMIR 100 UNIT/ML injection, INJECT 80 UNITS SUBCUTANEOUSLY ONCE DAILY IN THE MORNING, Disp: 30 mL, Rfl: 5   lisinopril (ZESTRIL) 2.5 MG tablet, TAKE 1 TABLET BY MOUTH ONCE DAILY(SCHEULE ANNUAL OFFICE EXAM) (Patient not taking: Reported on 06/12/2021), Disp: 90  tablet, Rfl: 0   sildenafil (REVATIO) 20 MG tablet, Take 1 to 2 tabs 2 - 3 hours before sex, Disp: 20 tablet, Rfl: 11  Review of Systems:  Constitutional: Denies fever, chills, diaphoresis, appetite change and fatigue.  HEENT: Denies photophobia, eye pain, redness, hearing loss, ear pain, congestion, sore throat, rhinorrhea, sneezing, mouth sores, trouble swallowing, neck pain, neck stiffness and tinnitus.   Respiratory: Denies SOB, DOE, cough, chest tightness,  and wheezing.   Cardiovascular: Denies chest pain, palpitations and leg swelling.  Gastrointestinal: Denies nausea, vomiting, abdominal pain, diarrhea, constipation, blood in stool and abdominal distention.  Genitourinary: Denies dysuria, urgency, frequency, hematuria, flank pain and difficulty urinating.  Endocrine: Denies: hot or cold intolerance, sweats, changes in hair or nails, polyuria, polydipsia. Musculoskeletal: Denies myalgias, back pain, joint swelling, arthralgias and gait problem.  Skin: Denies pallor, rash and wound.  Neurological: Denies dizziness, seizures, syncope, weakness, light-headedness, numbness and headaches.  Hematological: Denies adenopathy. Easy bruising, personal or family bleeding history  Psychiatric/Behavioral: Denies suicidal ideation, mood changes, confusion, nervousness, sleep disturbance and agitation    Physical Exam: Vitals:   06/12/21 0825  BP: 118/78  Pulse: 69  Temp: 97.9 F (36.6 C)  TempSrc: Oral  SpO2: 97%  Weight: 195 lb 8 oz (88.7 kg)  Height: 6' 2"  (1.88 m)    Body mass index is 25.1 kg/m.   Constitutional: NAD, calm, comfortable Eyes: PERRL, lids and conjunctivae normal ENMT: Mucous membranes are moist.  Respiratory: clear to auscultation bilaterally, no wheezing, no crackles. Normal respiratory effort. No accessory muscle use.  Cardiovascular: Regular rate and rhythm, no murmurs / rubs / gallops. No extremity edema.  Neurologic: Grossly intact and nonfocal Psychiatric:  Normal judgment and insight. Alert and oriented x 3. Normal mood.    Impression and Plan:  Hyperlipidemia, unspecified hyperlipidemia type -Last lipid panel in December 2021 with a total cholesterol of 161, triglycerides 81 LDL 104. -Goal LDL should be less than 70. -He is on maximum dose atorvastatin and ezetimibe. -If lipids remain above 70 next visit, consider referral to lipid clinic.  Type 1 diabetes mellitus with unspecified complications ([facility]) -Uncontrolled with a recent A1c of 10.1 in May.  He is followed by endocrinology.  Glaucoma, unspecified glaucoma type, unspecified laterality -He is up-to-date on his eye exams, there has been no progression per report.  Gastroesophageal reflux disease without esophagitis -Well-controlled, not on daily PPI therapy.  Hypertension associated with diabetes ([place]) -Blood pressures well controlled today.  Continue hydrochlorothiazide, lisinopril.    Lelon Frohlich, MD [facility]

## 2021-06-13 ENCOUNTER — Ambulatory Visit: Payer: Medicare HMO | Admitting: Internal Medicine

## 2021-07-04 ENCOUNTER — Ambulatory Visit (INDEPENDENT_AMBULATORY_CARE_PROVIDER_SITE_OTHER): Payer: Medicare HMO | Admitting: Endocrinology

## 2021-07-04 ENCOUNTER — Other Ambulatory Visit: Payer: Self-pay

## 2021-07-04 VITALS — BP 120/82 | HR 69 | Ht 74.0 in | Wt 197.0 lb

## 2021-07-04 DIAGNOSIS — E108 Type 1 diabetes mellitus with unspecified complications: Secondary | ICD-10-CM

## 2021-07-04 LAB — POCT GLYCOSYLATED HEMOGLOBIN (HGB A1C): Hemoglobin A1C: 9.7 % — AB (ref 4.0–5.6)

## 2021-07-04 MED ORDER — INSULIN DETEMIR 100 UNIT/ML ~~LOC~~ SOLN: 66.0000 [IU] | SUBCUTANEOUS | 3 refills | Status: DC

## 2021-07-04 MED ORDER — INSULIN ASPART 100 UNIT/ML IJ SOLN: 10.0000 [IU] | Freq: Every day | INTRAMUSCULAR | 99 refills | Status: DC

## 2021-07-04 MED ORDER — FREESTYLE LIBRE 14 DAY READER DEVI: 1.0000 | Freq: Once | 1 refills | Status: AC

## 2021-07-04 MED ORDER — FREESTYLE LIBRE 14 DAY READER DEVI: 1.0000 | Freq: Once | 1 refills | Status: DC

## 2021-07-04 NOTE — Patient Instructions (Addendum)
check your blood sugar twice a day.  vary the time of day when you check, between before the 3 meals, and at bedtime.  also check if you have symptoms of your blood sugar being too high or too low.  please keep a record of the readings and bring it to your next appointment here (or you can bring the meter itself).  You can write it on any piece of paper.  please call us sooner if your blood sugar goes below 70, or if you have a lot of readings over 200.   Please change the insulins to the numbers listed below.   On this type of insulin schedule, you should eat meals on a regular schedule.  In particular, you should eat lunch by noon.  If a meal is missed or significantly delayed, your blood sugar could go low.   You should also eat a snack at bedtime, to avoid the sugar going low in the early morning.   Please come back for a follow-up appointment in 3 months.

## 2021-07-04 NOTE — Progress Notes (Signed)
Subjective:    Patient ID: Christopher [name], male    DOB: [birth year], 75 y.o.   MRN: [id number]  HPI Pt returns for f/u of diabetes mellitus:  DM type: Insulin-requiring type 2 (but he may be evolving type 1).   Dx'ed: 2010.   Complications: PN and CRI.   Therapy: insulin since 2013.   DKA: never.   Severe hypoglycemia: last episode was in 2018.   Pancreatitis: never.  SDOH: he uses syringe and vial, after difficulty operating pen; he is back on 2 QD insulins, after noncompliance with multiple daily injections.   Other: he could not take lantus, due to AM hypoglycemia and PM hyperglycemia; however, he changed back after poor results with faster-acting qd insulin; He declines V-GO.  He eats meals at 7AM, 1PM, and 6PM.   Interval history: I reviewed continuous glucose monitor data.  Glucose varies from 50-350, but there are no data later in the day.  It is in general highest at [place].  pt states he feels well in general. He says he does not miss any insulin doses.  Pt has hypoglycemia approx once per week.  This usually happens before lunch.  He still takes levemir 66 units qam, and novolog 8 units with breakfast.   Past Medical History:  Diagnosis Date   Diabetes mellitus type II    GERD (gastroesophageal reflux disease)    Glaucoma    Hyperlipidemia    Hypertension     Past Surgical History:  Procedure Laterality Date   COLONOSCOPY     HERNIA REPAIR     PATELLA FRACTURE SURGERY  1993    Social History   Socioeconomic History   Marital status: Married    Spouse name: Not on file   Number of children: Not on file   Years of education: Not on file   Highest education level: Not on file  Occupational History   Not on file  Tobacco Use   Smoking status: Never   Smokeless tobacco: Never  Vaping Use   Vaping Use: Never used  Substance and Sexual Activity   Alcohol use: No   Drug use: No   Sexual activity: Not on file  Other Topics Concern   Not on file  Social History  Narrative   Not on file   Social Determinants of Health   Financial Resource Strain: Not on file  Food Insecurity: Not on file  Transportation Needs: Not on file  Physical Activity: Not on file  Stress: Not on file  Social Connections: Not on file  Intimate Partner Violence: Not on file    Current Outpatient Medications on File Prior to Visit  Medication Sig Dispense Refill   Alcohol Swabs (B-D SINGLE USE SWABS REGULAR) PADS USE TO CHECK BLOOD SUGAR 2 TIMES PER DAY 200 each prn   ALPHAGAN P 0.1 % SOLN Place 1 drop into both eyes daily.      aspirin EC 81 MG EC tablet Take 81 mg by mouth daily.     atorvastatin (LIPITOR) 80 MG tablet Take 1 tablet by mouth once daily 90 tablet 1   BD VEO INSULIN SYRINGE U/F 31G X 15/64" 1 ML MISC USE  SYRINGE TWICE DAILY 200 each 0   CINNAMON PO Take 100 mg by mouth daily.     Coenzyme Q10 (CO Q10) 100 MG CAPS Take by mouth.     Continuous Blood Gluc Sensor (FREESTYLE LIBRE 14 DAY SENSOR) MISC 1 Device by Other route every 14 (fourteen)  days. USE 1  EVERY TWO WEEKS 6 each 3   COVID-19 mRNA Vac-TriS, Pfizer, (PFIZER-BIONT COVID-19 VAC-TRIS) SUSP injection Inject into the muscle. 0.3 mL 0   dorzolamide-timolol (COSOPT) 22.3-6.8 MG/ML ophthalmic solution Place 1 drop into both eyes 2 (two) times daily.      doxycycline (VIBRAMYCIN) 100 MG capsule Take 1 capsule (100 mg total) by mouth 2 (two) times daily. 14 capsule 0   ezetimibe (ZETIA) 10 MG tablet Take 1 tablet (10 mg total) by mouth daily. 90 tablet 1   glucagon 1 MG injection Inject 1 mg into the muscle once as needed. 1 each 12   hydrochlorothiazide (HYDRODIURIL) 25 MG tablet Take 1 tablet by mouth once daily 90 tablet 1   lisinopril (ZESTRIL) 2.5 MG tablet TAKE 1 TABLET BY MOUTH ONCE DAILY(SCHEULE ANNUAL OFFICE EXAM) 90 tablet 0   Multiple Vitamins-Minerals (CENTRUM SILVER ULTRA MENS PO) Take 1 tablet by mouth daily.     sildenafil (REVATIO) 20 MG tablet Take 1 to 2 tabs 2 - 3 hours before sex 20  tablet 11   No current facility-administered medications on file prior to visit.    No Known Allergies  Family History  Problem Relation Age of Onset   Alcohol abuse Father    Diabetes Sister    Colon cancer Neg Hx    Esophageal cancer Neg Hx    Stomach cancer Neg Hx    Rectal cancer Neg Hx     BP 120/82 (BP Location: Right Arm, Patient Position: Sitting, Cuff Size: Normal)   Pulse 69   Ht 6\' 2"  (1.88 m)   Wt 197 lb (89.4 kg)   SpO2 97%   BMI 25.29 kg/m    Review of Systems     Objective:   Physical Exam Pulses: dorsalis pedis intact bilat.   MSK: no deformity of the feet CV: no leg edema Skin:  no ulcer on the feet.  normal color and temp on the feet. Neuro: sensation is intact to touch on the feet.    Lab Results  Component Value Date   HGBA1C 9.7 (A) 07/04/2021      Assessment & Plan:  Insulin-requiring type 2 DM: uncontrolled.   Hypoglycemia, due to insulin: The pattern of his cbg's indicates he needs some adjustment in his therapy.    Patient Instructions  check your blood sugar twice a day.  vary the time of day when you check, between before the 3 meals, and at bedtime.  also check if you have symptoms of your blood sugar being too high or too low.  please keep a record of the readings and bring it to your next appointment here (or you can bring the meter itself).  You can write it on any piece of paper.  please call us sooner if your blood sugar goes below 70, or if you have a lot of readings over 200.   Please change the insulins to the numbers listed below.   On this type of insulin schedule, you should eat meals on a regular schedule.  In particular, you should eat lunch by noon.  If a meal is missed or significantly delayed, your blood sugar could go low.   You should also eat a snack at bedtime, to avoid the sugar going low in the early morning.   Please come back for a follow-up appointment in 3 months.

## 2021-08-07 ENCOUNTER — Other Ambulatory Visit: Payer: Self-pay | Admitting: Internal Medicine

## 2021-08-07 DIAGNOSIS — E785 Hyperlipidemia, unspecified: Secondary | ICD-10-CM

## 2021-09-08 DIAGNOSIS — R404 Transient alteration of awareness: Secondary | ICD-10-CM | POA: Diagnosis not present

## 2021-09-08 DIAGNOSIS — R0902 Hypoxemia: Secondary | ICD-10-CM | POA: Diagnosis not present

## 2021-09-08 DIAGNOSIS — R4020 Unspecified coma: Secondary | ICD-10-CM | POA: Diagnosis not present

## 2021-09-08 DIAGNOSIS — E162 Hypoglycemia, unspecified: Secondary | ICD-10-CM | POA: Diagnosis not present

## 2021-09-08 DIAGNOSIS — E161 Other hypoglycemia: Secondary | ICD-10-CM | POA: Diagnosis not present

## 2021-09-11 ENCOUNTER — Other Ambulatory Visit: Payer: Self-pay | Admitting: Endocrinology

## 2021-09-11 DIAGNOSIS — E108 Type 1 diabetes mellitus with unspecified complications: Secondary | ICD-10-CM

## 2021-09-28 ENCOUNTER — Other Ambulatory Visit: Payer: Self-pay | Admitting: Endocrinology

## 2021-09-28 ENCOUNTER — Other Ambulatory Visit: Payer: Self-pay | Admitting: Internal Medicine

## 2021-09-28 DIAGNOSIS — E108 Type 1 diabetes mellitus with unspecified complications: Secondary | ICD-10-CM

## 2021-09-28 DIAGNOSIS — Z0000 Encounter for general adult medical examination without abnormal findings: Secondary | ICD-10-CM

## 2021-10-05 ENCOUNTER — Ambulatory Visit (INDEPENDENT_AMBULATORY_CARE_PROVIDER_SITE_OTHER): Payer: Medicare HMO | Admitting: Endocrinology

## 2021-10-05 ENCOUNTER — Other Ambulatory Visit: Payer: Self-pay

## 2021-10-05 ENCOUNTER — Encounter: Payer: Self-pay | Admitting: Endocrinology

## 2021-10-05 VITALS — BP 138/82 | HR 69 | Ht 74.0 in | Wt 204.2 lb

## 2021-10-05 DIAGNOSIS — E108 Type 1 diabetes mellitus with unspecified complications: Secondary | ICD-10-CM | POA: Diagnosis not present

## 2021-10-05 LAB — POCT GLYCOSYLATED HEMOGLOBIN (HGB A1C): Hemoglobin A1C: 9.6 % — AB (ref 4.0–5.6)

## 2021-10-05 MED ORDER — INSULIN DETEMIR 100 UNIT/ML ~~LOC~~ SOLN: 56.0000 [IU] | SUBCUTANEOUS | 3 refills | Status: DC

## 2021-10-05 MED ORDER — INSULIN ASPART 100 UNIT/ML IJ SOLN: 4.0000 [IU] | Freq: Every day | INTRAMUSCULAR | 99 refills | Status: DC

## 2021-10-05 MED ORDER — TRULICITY 0.75 MG/0.5ML ~~LOC~~ SOAJ: 0.7500 mg | SUBCUTANEOUS | 3 refills | Status: DC

## 2021-10-05 NOTE — Patient Instructions (Addendum)
check your blood sugar twice a day.  vary the time of day when you check, between before the 3 meals, and at bedtime.  also check if you have symptoms of your blood sugar being too high or too low.  please keep a record of the readings and bring it to your next appointment here (or you can bring the meter itself).  You can write it on any piece of paper.  please call us sooner if your blood sugar goes below 70, or if you have a lot of readings over 200.   I have sent a prescription to your pharmacy, to add Trulicity, and:  Please reduce the insulins to the numbers listed below.   On this type of insulin schedule, you should eat meals on a regular schedule.  In particular, you should eat lunch by noon.  If a meal is missed or significantly delayed, your blood sugar could go low.   You should also eat a snack at bedtime, to avoid the sugar going low in the early morning.   Please come back for a follow-up appointment in 1 month.

## 2021-10-05 NOTE — Progress Notes (Signed)
Subjective:    Patient ID: Christopher [name], male    DOB: 09-14-1946, 75 y.o.   MRN: [id number]  HPI Pt returns for f/u of diabetes mellitus:  DM type: Insulin-requiring type 2 (but he may be evolving type 1).   Dx'ed: 2010.   Complications: PN and CRI.   Therapy: insulin since 2013.   DKA: never.   Severe hypoglycemia: last episode was in 2018.   Pancreatitis: never.  SDOH: he uses syringe and vial, after difficulty operating pen; he is back on 2 QD insulins, after noncompliance with multiple daily injections.   Other: he could not take lantus, due to AM hypoglycemia and PM hyperglycemia; however, he changed back after poor results with faster-acting qd insulin; He declines V-GO.  He eats meals at 7AM, 1PM, and 6PM.   Interval history: I reviewed continuous glucose monitor data.  Glucose varies from 40-400.  It is in general highest at [place].  It is lowest at 1PM.  Glucose varies widely at all times of day.  Pt has hypoglycemia approx once per month.  This happens after AM Novolog.  He says he does not miss any insulin doses.  pt states he feels well in general.   Past Medical History:  Diagnosis Date   Diabetes mellitus type II    GERD (gastroesophageal reflux disease)    Glaucoma    Hyperlipidemia    Hypertension     Past Surgical History:  Procedure Laterality Date   COLONOSCOPY     HERNIA REPAIR     PATELLA FRACTURE SURGERY  1993    Social History   Socioeconomic History   Marital status: Married    Spouse name: Not on file   Number of children: Not on file   Years of education: Not on file   Highest education level: Not on file  Occupational History   Not on file  Tobacco Use   Smoking status: Never   Smokeless tobacco: Never  Vaping Use   Vaping Use: Never used  Substance and Sexual Activity   Alcohol use: No   Drug use: No   Sexual activity: Not on file  Other Topics Concern   Not on file  Social History Narrative   Not on file   Social  Determinants of Health   Financial Resource Strain: Not on file  Food Insecurity: Not on file  Transportation Needs: Not on file  Physical Activity: Not on file  Stress: Not on file  Social Connections: Not on file  Intimate Partner Violence: Not on file    Current Outpatient Medications on File Prior to Visit  Medication Sig Dispense Refill   Alcohol Swabs (B-D SINGLE USE SWABS REGULAR) PADS USE TO CHECK BLOOD SUGAR 2 TIMES PER DAY 200 each prn   ALPHAGAN P 0.1 % SOLN Place 1 drop into both eyes daily.      aspirin EC 81 MG EC tablet Take 81 mg by mouth daily.     atorvastatin (LIPITOR) 80 MG tablet Take 1 tablet by mouth once daily 90 tablet 1   BD VEO INSULIN SYRINGE U/F 31G X 15/64" 1 ML MISC USE  SYRINGE TWICE DAILY 200 each 0   CINNAMON PO Take 100 mg by mouth daily.     Coenzyme Q10 (CO Q10) 100 MG CAPS Take by mouth.     Continuous Blood Gluc Sensor (FREESTYLE LIBRE 14 DAY SENSOR) MISC USE 1 EVERY 2 WEEKS 6 each 0   COVID-19 mRNA Vac-TriS,  Pfizer, (PFIZER-BIONT COVID-19 VAC-TRIS) SUSP injection Inject into the muscle. 0.3 mL 0   dorzolamide-timolol (COSOPT) 22.3-6.8 MG/ML ophthalmic solution Place 1 drop into both eyes 2 (two) times daily.      doxycycline (VIBRAMYCIN) 100 MG capsule Take 1 capsule (100 mg total) by mouth 2 (two) times daily. 14 capsule 0   ezetimibe (ZETIA) 10 MG tablet Take 1 tablet by mouth once daily 90 tablet 1   glucagon 1 MG injection Inject 1 mg into the muscle once as needed. 1 each 12   hydrochlorothiazide (HYDRODIURIL) 25 MG tablet Take 1 tablet by mouth once daily 90 tablet 0   lisinopril (ZESTRIL) 2.5 MG tablet TAKE 1 TABLET BY MOUTH ONCE DAILY(SCHEULE ANNUAL OFFICE EXAM) 90 tablet 0   Multiple Vitamins-Minerals (CENTRUM SILVER ULTRA MENS PO) Take 1 tablet by mouth daily.     sildenafil (REVATIO) 20 MG tablet Take 1 to 2 tabs 2 - 3 hours before sex 20 tablet 11   No current facility-administered medications on file prior to visit.    No Known  Allergies  Family History  Problem Relation Age of Onset   Alcohol abuse Father    Diabetes Sister    Colon cancer Neg Hx    Esophageal cancer Neg Hx    Stomach cancer Neg Hx    Rectal cancer Neg Hx     BP 138/82 (BP Location: Right Arm, Patient Position: Sitting, Cuff Size: Normal)   Pulse 69   Ht 6\' 2"  (1.88 m)   Wt 204 lb 3.2 oz (92.6 kg)   SpO2 98%   BMI 26.22 kg/m    Review of Systems     Objective:   Physical Exam Pulses: dorsalis pedis intact bilat.   MSK: no deformity of the feet.   CV: no leg edema.   Skin:  no ulcer on the feet.  normal color and temp on the feet. Neuro: sensation is intact to touch on the feet.    Lab Results  Component Value Date   HGBA1C 9.6 (A) 10/05/2021       Assessment & Plan:  Insulin-requiring type 2 DM. uncontrolled Hypoglycemia, due to insulin. We discussed.  We'll favor GLP rx.   Patient Instructions  check your blood sugar twice a day.  vary the time of day when you check, between before the 3 meals, and at bedtime.  also check if you have symptoms of your blood sugar being too high or too low.  please keep a record of the readings and bring it to your next appointment here (or you can bring the meter itself).  You can write it on any piece of paper.  please call us sooner if your blood sugar goes below 70, or if you have a lot of readings over 200.   I have sent a prescription to your pharmacy, to add Trulicity, and:  Please reduce the insulins to the numbers listed below.   On this type of insulin schedule, you should eat meals on a regular schedule.  In particular, you should eat lunch by noon.  If a meal is missed or significantly delayed, your blood sugar could go low.   You should also eat a snack at bedtime, to avoid the sugar going low in the early morning.   Please come back for a follow-up appointment in 1 month.

## 2021-10-19 ENCOUNTER — Other Ambulatory Visit: Payer: Self-pay

## 2021-10-19 ENCOUNTER — Ambulatory Visit (INDEPENDENT_AMBULATORY_CARE_PROVIDER_SITE_OTHER): Payer: Medicare HMO | Admitting: *Deleted

## 2021-10-19 DIAGNOSIS — Z23 Encounter for immunization: Secondary | ICD-10-CM

## 2021-10-22 ENCOUNTER — Other Ambulatory Visit (HOSPITAL_BASED_OUTPATIENT_CLINIC_OR_DEPARTMENT_OTHER): Payer: Self-pay

## 2021-10-22 ENCOUNTER — Other Ambulatory Visit: Payer: Self-pay

## 2021-10-22 ENCOUNTER — Ambulatory Visit: Payer: Medicare HMO | Attending: Internal Medicine

## 2021-10-22 DIAGNOSIS — Z23 Encounter for immunization: Secondary | ICD-10-CM

## 2021-10-22 DIAGNOSIS — Z03818 Encounter for observation for suspected exposure to other biological agents ruled out: Secondary | ICD-10-CM | POA: Diagnosis not present

## 2021-10-22 DIAGNOSIS — Z20822 Contact with and (suspected) exposure to covid-19: Secondary | ICD-10-CM | POA: Diagnosis not present

## 2021-10-22 MED ORDER — PFIZER COVID-19 VAC BIVALENT 30 MCG/0.3ML IM SUSP
INTRAMUSCULAR | 0 refills | Status: DC
  Filled 2021-10-22: qty 0.3, 1d supply, fill #0

## 2021-10-22 NOTE — Progress Notes (Signed)
   Covid-19 Vaccination Clinic  Name:  EDSON DERIDDER    MRN: [id number] DOB: [birth year]  10/22/2021  Mr. Hilgers was observed post Covid-19 immunization for 15 minutes without incident. He was provided with Vaccine Information Sheet and instruction to access the V-Safe system.   Mr. Coster was instructed to call 911 with any severe reactions post vaccine: Difficulty breathing  Swelling of face and throat  A fast heartbeat  A bad rash all over body  Dizziness and weakness   Immunizations Administered     Name Date Dose VIS Date Route   Pfizer Covid-19 Vaccine Bivalent Booster 10/22/2021 10:17 AM 0.3 mL 08/15/2021 Intramuscular   Manufacturer: [place]   Lot: [id number]   [place]: [phone]

## 2021-11-02 ENCOUNTER — Ambulatory Visit (INDEPENDENT_AMBULATORY_CARE_PROVIDER_SITE_OTHER): Payer: Medicare HMO | Admitting: Endocrinology

## 2021-11-02 ENCOUNTER — Other Ambulatory Visit: Payer: Self-pay

## 2021-11-02 ENCOUNTER — Telehealth: Payer: Self-pay

## 2021-11-02 VITALS — BP 120/84 | HR 63 | Ht 74.0 in | Wt 202.8 lb

## 2021-11-02 DIAGNOSIS — E1165 Type 2 diabetes mellitus with hyperglycemia: Secondary | ICD-10-CM | POA: Diagnosis not present

## 2021-11-02 DIAGNOSIS — E108 Type 1 diabetes mellitus with unspecified complications: Secondary | ICD-10-CM

## 2021-11-02 MED ORDER — TRULICITY 0.75 MG/0.5ML ~~LOC~~ SOAJ: 0.7500 mg | SUBCUTANEOUS | 3 refills | Status: DC

## 2021-11-02 NOTE — Patient Instructions (Addendum)
check your blood sugar twice a day.  vary the time of day when you check, between before the 3 meals, and at bedtime.  also check if you have symptoms of your blood sugar being too high or too low.  please keep a record of the readings and bring it to your next appointment here (or you can bring the meter itself).  You can write it on any piece of paper.  please call us sooner if your blood sugar goes below 70, or if you have a lot of readings over 200.   I have sent a prescription to your pharmacy, to add Trulicity, and:  Please reduce the insulins to the numbers listed below.   On this type of insulin schedule, you should eat meals on a regular schedule.  In particular, you should eat lunch by noon.  If a meal is missed or significantly delayed, your blood sugar could go low.   You should also eat a snack at bedtime, to avoid the sugar going low in the early morning.   Please come back for a follow-up appointment in 2 months.

## 2021-11-02 NOTE — Progress Notes (Signed)
Subjective:    Patient ID: Christopher [name], male    DOB: [birth year], 75 y.o.   MRN: [id number]  HPI Pt returns for f/u of diabetes mellitus:  DM type: Insulin-requiring type 2 (but he may be evolving type 1).   Dx'ed: 2010.   Complications: PN and CRI.   Therapy: insulin since 2013.   DKA: never.   Severe hypoglycemia: last episode was in 2018.   Pancreatitis: never.  SDOH: he uses syringe and vial, after difficulty operating pen; he is back on 2 QD insulins, after noncompliance with multiple daily injections.   Other: he could not take lantus, due to AM hypoglycemia and PM hyperglycemia; however, he changed back after poor results with faster-acting qd insulin; He declines V-GO.  He eats meals at 7AM, 1PM, and 6PM.   Interval history: Pt says he did not receive the Trulicity rx from [facility].   Past Medical History:  Diagnosis Date   Diabetes mellitus type II    GERD (gastroesophageal reflux disease)    Glaucoma    Hyperlipidemia    Hypertension     Past Surgical History:  Procedure Laterality Date   COLONOSCOPY     HERNIA REPAIR     PATELLA FRACTURE SURGERY  1993    Social History   Socioeconomic History   Marital status: Married    Spouse name: Not on file   Number of children: Not on file   Years of education: Not on file   Highest education level: Not on file  Occupational History   Not on file  Tobacco Use   Smoking status: Never   Smokeless tobacco: Never  Vaping Use   Vaping Use: Never used  Substance and Sexual Activity   Alcohol use: No   Drug use: No   Sexual activity: Not on file  Other Topics Concern   Not on file  Social History Narrative   Not on file   Social Determinants of Health   Financial Resource Strain: Not on file  Food Insecurity: Not on file  Transportation Needs: Not on file  Physical Activity: Not on file  Stress: Not on file  Social Connections: Not on file  Intimate Partner Violence: Not on file    Current Outpatient  Medications on File Prior to Visit  Medication Sig Dispense Refill   Alcohol Swabs (B-D SINGLE USE SWABS REGULAR) PADS USE TO CHECK BLOOD SUGAR 2 TIMES PER DAY 200 each prn   ALPHAGAN P 0.1 % SOLN Place 1 drop into both eyes daily.      aspirin EC 81 MG EC tablet Take 81 mg by mouth daily.     atorvastatin (LIPITOR) 80 MG tablet Take 1 tablet by mouth once daily 90 tablet 1   BD VEO INSULIN SYRINGE U/F 31G X 15/64" 1 ML MISC USE  SYRINGE TWICE DAILY 200 each 0   CINNAMON PO Take 100 mg by mouth daily.     Coenzyme Q10 (CO Q10) 100 MG CAPS Take by mouth.     Continuous Blood Gluc Sensor (FREESTYLE LIBRE 14 DAY SENSOR) MISC USE 1 EVERY 2 WEEKS 6 each 0   COVID-19 mRNA bivalent vaccine, Pfizer, (PFIZER COVID-19 VAC BIVALENT) injection Inject into the muscle. 0.3 mL 0   COVID-19 mRNA Vac-TriS, Pfizer, (PFIZER-BIONT COVID-19 VAC-TRIS) SUSP injection Inject into the muscle. 0.3 mL 0   dorzolamide-timolol (COSOPT) 22.3-6.8 MG/ML ophthalmic solution Place 1 drop into both eyes 2 (two) times daily.      ezetimibe (ZETIA)  10 MG tablet Take 1 tablet by mouth once daily 90 tablet 1   glucagon 1 MG injection Inject 1 mg into the muscle once as needed. 1 each 12   hydrochlorothiazide (HYDRODIURIL) 25 MG tablet Take 1 tablet by mouth once daily 90 tablet 0   insulin aspart (NOVOLOG) 100 UNIT/ML injection Inject 4 Units into the skin daily with breakfast. 10 mL PRN   insulin detemir (LEVEMIR) 100 UNIT/ML injection Inject 0.56 mLs (56 Units total) into the skin every morning. 70 mL 3   lisinopril (ZESTRIL) 2.5 MG tablet TAKE 1 TABLET BY MOUTH ONCE DAILY(SCHEULE ANNUAL OFFICE EXAM) 90 tablet 0   Multiple Vitamins-Minerals (CENTRUM SILVER ULTRA MENS PO) Take 1 tablet by mouth daily.     sildenafil (REVATIO) 20 MG tablet Take 1 to 2 tabs 2 - 3 hours before sex 20 tablet 11   No current facility-administered medications on file prior to visit.    No Known Allergies  Family History  Problem Relation Age of  Onset   Alcohol abuse Father    Diabetes Sister    Colon cancer Neg Hx    Esophageal cancer Neg Hx    Stomach cancer Neg Hx    Rectal cancer Neg Hx     BP 120/84 (BP Location: Right Arm, Patient Position: Sitting, Cuff Size: Normal)   Pulse 63   Ht 6\' 2"  (1.88 m)   Wt 202 lb 12.8 oz (92 kg)   SpO2 97%   BMI 26.04 kg/m   Review of Systems     Objective:   Physical Exam VITAL SIGNS:  See vs page GENERAL: no distress   Lab Results  Component Value Date   HGBA1C 9.6 (A) 10/05/2021      Assessment & Plan:  Insulin-requiring type 2 DM: uncontrolled.   Patient Instructions  check your blood sugar twice a day.  vary the time of day when you check, between before the 3 meals, and at bedtime.  also check if you have symptoms of your blood sugar being too high or too low.  please keep a record of the readings and bring it to your next appointment here (or you can bring the meter itself).  You can write it on any piece of paper.  please call us sooner if your blood sugar goes below 70, or if you have a lot of readings over 200.   I have sent a prescription to your pharmacy, to add Trulicity, and:  Please reduce the insulins to the numbers listed below.   On this type of insulin schedule, you should eat meals on a regular schedule.  In particular, you should eat lunch by noon.  If a meal is missed or significantly delayed, your blood sugar could go low.   You should also eat a snack at bedtime, to avoid the sugar going low in the early morning.   Please come back for a follow-up appointment in 2 months.

## 2021-11-02 NOTE — Telephone Encounter (Signed)
Patient called regarding  rx for trulicity he stated the rx costs $711.66, patient wants to know if he is able to get a coupon for rx or if there are other options for rx cost call back:[phone]

## 2021-11-05 ENCOUNTER — Other Ambulatory Visit (HOSPITAL_COMMUNITY): Payer: Self-pay

## 2021-11-05 ENCOUNTER — Other Ambulatory Visit: Payer: Self-pay | Admitting: Endocrinology

## 2021-11-05 MED ORDER — VICTOZA 18 MG/3ML ~~LOC~~ SOPN: 0.6000 mg | PEN_INJECTOR | Freq: Every day | SUBCUTANEOUS | 3 refills | Status: DC

## 2021-11-06 ENCOUNTER — Telehealth: Payer: Self-pay | Admitting: Endocrinology

## 2021-11-06 NOTE — Telephone Encounter (Signed)
error 

## 2021-11-06 NOTE — Telephone Encounter (Signed)
Attempted to contact the patient in regards to the alternative medication being sent in. LVM for any questions or concerns

## 2021-12-12 ENCOUNTER — Telehealth: Payer: Self-pay | Admitting: Endocrinology

## 2021-12-12 DIAGNOSIS — E108 Type 1 diabetes mellitus with unspecified complications: Secondary | ICD-10-CM

## 2021-12-14 ENCOUNTER — Telehealth: Payer: Self-pay | Admitting: Endocrinology

## 2021-12-14 ENCOUNTER — Encounter: Payer: Self-pay | Admitting: Internal Medicine

## 2021-12-14 ENCOUNTER — Ambulatory Visit (INDEPENDENT_AMBULATORY_CARE_PROVIDER_SITE_OTHER): Payer: Medicare HMO | Admitting: Internal Medicine

## 2021-12-14 VITALS — BP 120/82 | HR 69 | Temp 97.9°F | Ht 74.0 in | Wt 204.0 lb

## 2021-12-14 DIAGNOSIS — E108 Type 1 diabetes mellitus with unspecified complications: Secondary | ICD-10-CM

## 2021-12-14 DIAGNOSIS — Z0000 Encounter for general adult medical examination without abnormal findings: Secondary | ICD-10-CM

## 2021-12-14 DIAGNOSIS — E1069 Type 1 diabetes mellitus with other specified complication: Secondary | ICD-10-CM | POA: Diagnosis not present

## 2021-12-14 DIAGNOSIS — E785 Hyperlipidemia, unspecified: Secondary | ICD-10-CM

## 2021-12-14 DIAGNOSIS — H409 Unspecified glaucoma: Secondary | ICD-10-CM | POA: Diagnosis not present

## 2021-12-14 NOTE — Patient Instructions (Signed)
-  Nice seeing you today!!  -Return fasting for labs.  -Remember your eye and dental exams. Eye referral placed today.  -Remember your shingles vaccines at the pharmacy.  -Schedule follow up in 6 months or sooner as needed.

## 2021-12-14 NOTE — Telephone Encounter (Signed)
MEDICATION: insulin aspart (NOVOLOG) 100 UNIT/ML injection insulin detemir (LEVEMIR) 100 UNIT/ML injection  PHARMACY:   [facility] ([facility]), [place] Phone:  [phone]  Fax:  [phone]      HAS THE PATIENT CONTACTED THEIR PHARMACY?  yes  IS THIS A 90 DAY SUPPLY : yes  IS PATIENT OUT OF MEDICATION: yes  IF NOT; HOW MUCH IS LEFT:   LAST APPOINTMENT DATE: @12 /28/2022  NEXT APPOINTMENT DATE:@2 /08/2022  DO WE HAVE YOUR PERMISSION TO LEAVE A DETAILED MESSAGE?:  OTHER COMMENTS:    **Let patient know to contact pharmacy at the end of the day to make sure medication is ready. **  ** Please notify patient to allow 48-72 hours to process**  **Encourage patient to contact the pharmacy for refills or they can request refills through [facility]**

## 2021-12-14 NOTE — Progress Notes (Signed)
Established Patient Office Visit     This visit occurred during the SARS-CoV-2 public health emergency.  Safety protocols were in place, including screening questions prior to the visit, additional usage of staff PPE, and extensive cleaning of exam room while observing appropriate contact time as indicated for disinfecting solutions.    CC/Reason for Visit: Annual preventive exam and subsequent Medicare wellness visit  HPI: Christopher [name] is a 75 y.o. male who is coming in today for the above mentioned reasons. Past Medical History is significant for: Hypertension, hyperlipidemia, insulin-dependent diabetes followed by endocrinology, glaucoma.  He has been feeling well and has no acute concerns or complaints today.  He is overdue for eye and dental care.  He exercises by going to [employer] 1 day a week and the [employer] 1 to 2 days a week.  He is overdue for shingles vaccine.  He is overdue for diabetic eye exam.   Past Medical/Surgical History: Past Medical History:  Diagnosis Date   Diabetes mellitus type II    GERD (gastroesophageal reflux disease)    Glaucoma    Hyperlipidemia    Hypertension     Past Surgical History:  Procedure Laterality Date   COLONOSCOPY     HERNIA REPAIR     PATELLA FRACTURE SURGERY  1993    Social History:  reports that he has never smoked. He has never used smokeless tobacco. He reports that he does not drink alcohol and does not use drugs.  Allergies: No Known Allergies  Family History:  Family History  Problem Relation Age of Onset   Alcohol abuse Father    Diabetes Sister    Colon cancer Neg Hx    Esophageal cancer Neg Hx    Stomach cancer Neg Hx    Rectal cancer Neg Hx      Current Outpatient Medications:    Alcohol Swabs (B-D SINGLE USE SWABS REGULAR) PADS, USE TO CHECK BLOOD SUGAR 2 TIMES PER DAY, Disp: 200 each, Rfl: prn   ALPHAGAN P 0.1 % SOLN, Place 1 drop into both eyes daily. , Disp: , Rfl:    aspirin EC 81 MG EC  tablet, Take 81 mg by mouth daily., Disp: , Rfl:    atorvastatin (LIPITOR) 80 MG tablet, Take 1 tablet by mouth once daily, Disp: 90 tablet, Rfl: 1   BD VEO INSULIN SYRINGE U/F 31G X 15/64" 1 ML MISC, USE  SYRINGE TWICE DAILY, Disp: 200 each, Rfl: 0   CINNAMON PO, Take 100 mg by mouth daily., Disp: , Rfl:    Coenzyme Q10 (CO Q10) 100 MG CAPS, Take by mouth., Disp: , Rfl:    Continuous Blood Gluc Sensor (FREESTYLE LIBRE 14 DAY SENSOR) MISC, USE 1 EVERY 2 WEEKS, Disp: 6 each, Rfl: 0   COVID-19 mRNA bivalent vaccine, Pfizer, (PFIZER COVID-19 VAC BIVALENT) injection, Inject into the muscle., Disp: 0.3 mL, Rfl: 0   COVID-19 mRNA Vac-TriS, Pfizer, (PFIZER-BIONT COVID-19 VAC-TRIS) SUSP injection, Inject into the muscle., Disp: 0.3 mL, Rfl: 0   dorzolamide-timolol (COSOPT) 22.3-6.8 MG/ML ophthalmic solution, Place 1 drop into both eyes 2 (two) times daily. , Disp: , Rfl:    ezetimibe (ZETIA) 10 MG tablet, Take 1 tablet by mouth once daily, Disp: 90 tablet, Rfl: 1   glucagon 1 MG injection, Inject 1 mg into the muscle once as needed., Disp: 1 each, Rfl: 12   hydrochlorothiazide (HYDRODIURIL) 25 MG tablet, Take 1 tablet by mouth once daily, Disp: 90 tablet, Rfl: 0  insulin aspart (NOVOLOG) 100 UNIT/ML injection, Inject 4 Units into the skin daily with breakfast., Disp: 10 mL, Rfl: PRN   insulin detemir (LEVEMIR) 100 UNIT/ML injection, Inject 0.56 mLs (56 Units total) into the skin every morning., Disp: 70 mL, Rfl: 3   lisinopril (ZESTRIL) 2.5 MG tablet, TAKE 1 TABLET BY MOUTH ONCE DAILY(SCHEULE ANNUAL OFFICE EXAM), Disp: 90 tablet, Rfl: 0   Multiple Vitamins-Minerals (CENTRUM SILVER ULTRA MENS PO), Take 1 tablet by mouth daily., Disp: , Rfl:    sildenafil (REVATIO) 20 MG tablet, Take 1 to 2 tabs 2 - 3 hours before sex, Disp: 20 tablet, Rfl: 11   liraglutide (VICTOZA) 18 MG/3ML SOPN, Inject 0.6 mg into the skin daily. (Patient not taking: Reported on 12/14/2021), Disp: 3 mL, Rfl: 3  Review of Systems:   Constitutional: Denies fever, chills, diaphoresis, appetite change and fatigue.  HEENT: Denies photophobia, eye pain, redness, hearing loss, ear pain, congestion, sore throat, rhinorrhea, sneezing, mouth sores, trouble swallowing, neck pain, neck stiffness and tinnitus.   Respiratory: Denies SOB, DOE, cough, chest tightness,  and wheezing.   Cardiovascular: Denies chest pain, palpitations and leg swelling.  Gastrointestinal: Denies nausea, vomiting, abdominal pain, diarrhea, constipation, blood in stool and abdominal distention.  Genitourinary: Denies dysuria, urgency, frequency, hematuria, flank pain and difficulty urinating.  Endocrine: Denies: hot or cold intolerance, sweats, changes in hair or nails, polyuria, polydipsia. Musculoskeletal: Denies myalgias, back pain, joint swelling, arthralgias and gait problem.  Skin: Denies pallor, rash and wound.  Neurological: Denies dizziness, seizures, syncope, weakness, light-headedness, numbness and headaches.  Hematological: Denies adenopathy. Easy bruising, personal or family bleeding history  Psychiatric/Behavioral: Denies suicidal ideation, mood changes, confusion, nervousness, sleep disturbance and agitation    Physical Exam: Vitals:   12/14/21 0739  BP: 120/82  Pulse: 69  Temp: 97.9 F (36.6 C)  TempSrc: Oral  SpO2: 100%  Weight: 204 lb (92.5 kg)  Height: 6\' 2"  (1.88 m)    Body mass index is 26.19 kg/m.   Constitutional: NAD, calm, comfortable Eyes: PERRL, lids and conjunctivae normal ENMT: Mucous membranes are moist. Posterior pharynx clear of any exudate or lesions. Normal dentition. Tympanic membrane is pearly white, no erythema or bulging. Neck: normal, supple, no masses, no thyromegaly Respiratory: clear to auscultation bilaterally, no wheezing, no crackles. Normal respiratory effort. No accessory muscle use.  Cardiovascular: Regular rate and rhythm, no murmurs / rubs / gallops. No extremity edema. 2+ pedal pulses. No  carotid bruits.  Abdomen: no tenderness, no masses palpated. No hepatosplenomegaly. Bowel sounds positive.  Musculoskeletal: no clubbing / cyanosis. No joint deformity upper and lower extremities. Good ROM, no contractures. Normal muscle tone.  Skin: no rashes, lesions, ulcers. No induration Neurologic: CN 2-12 grossly intact. Sensation intact, DTR normal. Strength 5/5 in all 4.  Psychiatric: Normal judgment and insight. Alert and oriented x 3. Normal mood.    Subsequent Medicare wellness visit   1. Risk factors, based on past  M,S,F -cardiovascular disease risk factors include age, gender, history of diabetes, hypertension and hyperlipidemia   2.  Physical activities: Does cardio and weight training   3.  Depression/mood: Stable, not depressed   4.  Hearing: No perceived issues   5.  ADL's: Independent in all ADLs   6.  Fall risk: Low fall risk   7.  Home safety: No problems identified   8.  Height weight, and visual acuity: height and weight as above, vision:  Vision Screening   Right eye Left eye Both eyes  Without  correction 20/50 20/50 20/20   With correction        9.  Counseling: Advised he update his vaccination status as well as his diabetic eye exam   10. Lab orders based on risk factors: Laboratory update will be reviewed   11. Referral : Ophthalmology   12. Care plan: Follow-up with me in 6 months and with endocrinology as scheduled   13. Cognitive assessment: No cognitive impairment   14. Screening: Patient provided with a written and personalized 5-10 year screening schedule in the AVS. yes   15. Provider List Update: PCP, endocrinology Dr. Loanne Drilling  16. Advance Directives: Full code   17. Opioids: Patient is not on any opioid prescriptions and has no risk factors for a substance use disorder.   [place] Office Visit from 12/14/2021 in [place] at [facility]  PHQ-9 Total Score 0       Fall Risk 03/05/2019 10/06/2019 12/13/2020  05/18/2021 12/14/2021  Falls in the past year? 0 0 0 - 0  Was there an injury with Fall? 0 0 0 - 0  Fall Risk Category Calculator 0 0 0 - 0  Fall Risk Category Low Low Low - Low  Patient Fall Risk Level - - - Low fall risk Low fall risk     Impression and Plan:  Encounter for preventive health examination  -Recommend routine eye and dental care. -Immunizations: He is overdue for shingles vaccine but otherwise immunizations are up-to-date -Healthy lifestyle discussed in detail. -Labs to be updated today. -Colon cancer screening: 10/2019 -Breast cancer screening: Not applicable -Cervical cancer screening: Not applicable -Lung cancer screening: Not applicable -Prostate cancer screening: PSA today -DEXA: Not applicable  Hyperlipidemia due to type 1 diabetes mellitus ([place])  - Plan: Lipid panel -Last lipid panel in December 2021 with a total cholesterol of 161, triglycerides 81 and LDL 104.  He is currently on atorvastatin 80 mg daily and ezetimibe 10 mg daily.  Type 1 diabetes mellitus with unspecified complications ([place])  - Plan: CBC with Differential/Platelet, Comprehensive metabolic panel, TSH, Vitamin B12, VITAMIN D 25 Hydroxy (Vit-D Deficiency, Fractures), Ambulatory referral to Ophthalmology -Followed by endocrinology, has been uncontrolled with an A1c of 9.6 in October.  Glaucoma, unspecified glaucoma type, unspecified laterality -Vision appears to have deteriorated, refer to ophthalmology.    Patient Instructions  -Nice seeing you today!!  -Return fasting for labs.  -Remember your eye and dental exams. Eye referral placed today.  -Remember your shingles vaccines at the pharmacy.  -Schedule follow up in 6 months or sooner as needed.      Lelon Frohlich, MD [facility]

## 2021-12-18 MED ORDER — INSULIN DETEMIR 100 UNIT/ML ~~LOC~~ SOLN: 56.0000 [IU] | SUBCUTANEOUS | 3 refills | Status: DC

## 2021-12-18 MED ORDER — INSULIN ASPART 100 UNIT/ML IJ SOLN: 4.0000 [IU] | Freq: Every day | INTRAMUSCULAR | 99 refills | Status: DC

## 2021-12-18 NOTE — Telephone Encounter (Signed)
Script sent  

## 2021-12-24 ENCOUNTER — Telehealth: Payer: Self-pay | Admitting: Endocrinology

## 2021-12-24 ENCOUNTER — Other Ambulatory Visit: Payer: Self-pay

## 2021-12-24 DIAGNOSIS — E108 Type 1 diabetes mellitus with unspecified complications: Secondary | ICD-10-CM

## 2021-12-24 MED ORDER — "BD VEO INSULIN SYRINGE U/F 31G X 15/64"" 1 ML MISC": 5 refills | Status: DC

## 2021-12-24 NOTE — Telephone Encounter (Signed)
Rx sent 

## 2021-12-24 NOTE — Telephone Encounter (Signed)
MEDICATION: BD VEO INSULIN SYRINGE U/F 31G X 15/64" 1 ML MISC  PHARMACY:   [place] ([facility]), [place] - [place] Phone:  [phone]  Fax:  [phone]      HAS THE PATIENT CONTACTED [place]?  yes  IS THIS A 90 DAY SUPPLY : yes  IS PATIENT OUT OF MEDICATION: 2 left  IF NOT; HOW MUCH IS LEFT:   LAST APPOINTMENT DATE: @11 /22/2022  NEXT APPOINTMENT DATE:@12 /30/2022  DO WE HAVE YOUR PERMISSION TO LEAVE A DETAILED MESSAGE?:  OTHER COMMENTS: Pharmacy did not received refill request per patient. Please forward ASAP.   **Let patient know to contact pharmacy at the end of the day to make sure medication is ready. **  ** Please notify patient to allow 48-72 hours to process**  **Encourage patient to contact the pharmacy for refills or they can request refills through [facility]**

## 2021-12-24 NOTE — Telephone Encounter (Signed)
MEDICATION: BD VEO INSULIN SYRINGE U/F 31G X 15/64" 1 ML MISC and insulin detemir (LEVEMIR) 100 UNIT/ML injection   PHARMACY:   [place] (SE), [place] Phone:  [phone]  Fax:  [phone]      HAS THE PATIENT CONTACTED [place]?  yes  IS THIS A 90 DAY SUPPLY : yes  IS PATIENT OUT OF MEDICATION: 2 left of syringes and 1 vial of Levemir.  IF NOT; HOW MUCH IS LEFT:   LAST APPOINTMENT DATE: @12 /30/2022  NEXT APPOINTMENT DATE:@2 /08/2022  DO WE HAVE YOUR PERMISSION TO LEAVE A DETAILED MESSAGE?:  OTHER COMMENTS: Per patient last visit with Provider it was mentioned that new insulin was going to be prescribed. Patient does not remember the name of the insulin. Please contact patient to confirm medication.Please call [phone].   **Let patient know to contact pharmacy at the end of the day to make sure medication is ready. **  ** Please notify patient to allow 48-72 hours to process**  **Encourage patient to contact the pharmacy for refills or they can request refills through [facility]**

## 2021-12-27 ENCOUNTER — Other Ambulatory Visit: Payer: Self-pay | Admitting: Endocrinology

## 2021-12-27 DIAGNOSIS — E108 Type 1 diabetes mellitus with unspecified complications: Secondary | ICD-10-CM

## 2022-01-07 ENCOUNTER — Telehealth: Payer: Self-pay

## 2022-01-07 ENCOUNTER — Telehealth: Payer: Self-pay | Admitting: Endocrinology

## 2022-01-07 NOTE — Telephone Encounter (Addendum)
Pt will come on 01/08/2022 to pick forms up that he dropped off from transportation.  Told him that he will need fill out/sign his porting of the paperwork and then take it to his eye dr to fill out there portion of the paperwork and then bring it back to our office to be completed/faxed out. Pt understood.  Forms will be placed in the accordion for pick up  Spoke with pt regarding some forms that he is needing filled out by Loanne Drilling but as I told him mostof the things that the paper is requiring will need to filled out by his PCP or his eye dr. I advised him once he has had his other Dr to fill out there portion then bring it back to [place] and will will try and compete the rest if it is within our scope of practice.

## 2022-01-07 NOTE — Telephone Encounter (Signed)
DMV documentation placed into Provider box.

## 2022-01-14 ENCOUNTER — Telehealth: Payer: Self-pay | Admitting: Endocrinology

## 2022-01-14 NOTE — Telephone Encounter (Signed)
Pt is calling in stating that he went to [facility] to pick up his Rx insulin detemir (LEVEMIR) 100 he is not able to get it 03/11/2022 and wanted to see if someone from the office can call the pharmacy to seer if he is able to get sooner due to him going to be out of it in 3 days.  Pt would like to have a call back to let him know what to do.

## 2022-01-21 ENCOUNTER — Telehealth: Payer: Self-pay

## 2022-01-24 ENCOUNTER — Ambulatory Visit (INDEPENDENT_AMBULATORY_CARE_PROVIDER_SITE_OTHER): Payer: Medicare HMO | Admitting: Endocrinology

## 2022-01-24 ENCOUNTER — Other Ambulatory Visit: Payer: Self-pay

## 2022-01-24 VITALS — BP 130/80 | HR 67 | Ht 74.0 in | Wt 200.0 lb

## 2022-01-24 DIAGNOSIS — E108 Type 1 diabetes mellitus with unspecified complications: Secondary | ICD-10-CM | POA: Diagnosis not present

## 2022-01-24 LAB — POCT GLYCOSYLATED HEMOGLOBIN (HGB A1C): Hemoglobin A1C: 9.5 % — AB (ref 4.0–5.6)

## 2022-01-24 MED ORDER — VICTOZA 18 MG/3ML ~~LOC~~ SOPN: 1.2000 mg | PEN_INJECTOR | Freq: Every day | SUBCUTANEOUS | 3 refills | Status: DC

## 2022-01-24 MED ORDER — INSULIN ASPART 100 UNIT/ML IJ SOLN: 4.0000 [IU] | Freq: Every day | INTRAMUSCULAR | 99 refills | Status: DC

## 2022-01-24 MED ORDER — INSULIN DETEMIR 100 UNIT/ML ~~LOC~~ SOLN: 56.0000 [IU] | SUBCUTANEOUS | 3 refills | Status: DC

## 2022-01-24 NOTE — Progress Notes (Signed)
Subjective:    Patient ID: Christopher [name], male    DOB: April 11, 1946, 76 y.o.   MRN: [id number]  HPI Pt returns for f/u of diabetes mellitus:  DM type: Insulin-requiring type 2 (but he may be evolving type 1).   Dx'ed: 2010.   Complications: PN and CRI.   Therapy: insulin since 2013.   DKA: never.   Severe hypoglycemia: last episode was in 2018.   Pancreatitis: never.  SDOH: he uses syringe and vial, after difficulty operating pen; he is back on 2 QD insulins, after noncompliance with multiple daily injections; ins declined Trulicity.   Other: he could not take lantus, due to AM hypoglycemia and PM hyperglycemia; however, he changed back after poor results with faster-acting qd insulin; He declines V-GO.  He eats meals at 7AM, 1PM, and 6PM.   Interval history: pt says he takes only 2 injections per day.  He takes insulins at 6-7AM.  I reviewed continuous glucose monitor data.  Glucose varies from 45-400.  It is in general highest at 11AM and 5PM-8PM.  It is lowest at 2-3PM.  However, it varies widely at all times of day.   Past Medical History:  Diagnosis Date   Diabetes mellitus type II    GERD (gastroesophageal reflux disease)    Glaucoma    Hyperlipidemia    Hypertension     Past Surgical History:  Procedure Laterality Date   COLONOSCOPY     HERNIA REPAIR     PATELLA FRACTURE SURGERY  1993    Social History   Socioeconomic History   Marital status: Married    Spouse name: Not on file   Number of children: Not on file   Years of education: Not on file   Highest education level: Not on file  Occupational History   Not on file  Tobacco Use   Smoking status: Never   Smokeless tobacco: Never  Vaping Use   Vaping Use: Never used  Substance and Sexual Activity   Alcohol use: No   Drug use: No   Sexual activity: Not on file  Other Topics Concern   Not on file  Social History Narrative   Not on file   Social Determinants of Health   Financial Resource Strain: Not  on file  Food Insecurity: Not on file  Transportation Needs: Not on file  Physical Activity: Not on file  Stress: Not on file  Social Connections: Not on file  Intimate Partner Violence: Not on file    Current Outpatient Medications on File Prior to Visit  Medication Sig Dispense Refill   Alcohol Swabs (B-D SINGLE USE SWABS REGULAR) PADS USE TO CHECK BLOOD SUGAR 2 TIMES PER DAY 200 each prn   ALPHAGAN P 0.1 % SOLN Place 1 drop into both eyes daily.      aspirin EC 81 MG EC tablet Take 81 mg by mouth daily.     atorvastatin (LIPITOR) 80 MG tablet Take 1 tablet by mouth once daily 90 tablet 1   CINNAMON PO Take 100 mg by mouth daily.     Coenzyme Q10 (CO Q10) 100 MG CAPS Take by mouth.     Continuous Blood Gluc Sensor (FREESTYLE LIBRE 14 DAY SENSOR) MISC USE 1  EVERY TWO WEEKS 6 each 0   COVID-19 mRNA bivalent vaccine, Pfizer, (PFIZER COVID-19 VAC BIVALENT) injection Inject into the muscle. 0.3 mL 0   COVID-19 mRNA Vac-TriS, Pfizer, (PFIZER-BIONT COVID-19 VAC-TRIS) SUSP injection Inject into the muscle. 0.3 mL 0  dorzolamide-timolol (COSOPT) 22.3-6.8 MG/ML ophthalmic solution Place 1 drop into both eyes 2 (two) times daily.      ezetimibe (ZETIA) 10 MG tablet Take 1 tablet by mouth once daily 90 tablet 1   glucagon 1 MG injection Inject 1 mg into the muscle once as needed. 1 each 12   hydrochlorothiazide (HYDRODIURIL) 25 MG tablet Take 1 tablet by mouth once daily 90 tablet 0   Insulin Syringe-Needle U-100 (BD VEO INSULIN SYRINGE U/F) 31G X 15/64" 1 ML MISC USE  SYRINGE TWICE DAILY 200 each 5   lisinopril (ZESTRIL) 2.5 MG tablet TAKE 1 TABLET BY MOUTH ONCE DAILY(SCHEULE ANNUAL OFFICE EXAM) 90 tablet 0   Multiple Vitamins-Minerals (CENTRUM SILVER ULTRA MENS PO) Take 1 tablet by mouth daily.     sildenafil (REVATIO) 20 MG tablet Take 1 to 2 tabs 2 - 3 hours before sex 20 tablet 11   No current facility-administered medications on file prior to visit.    No Known Allergies  Family  History  Problem Relation Age of Onset   Alcohol abuse Father    Diabetes Sister    Colon cancer Neg Hx    Esophageal cancer Neg Hx    Stomach cancer Neg Hx    Rectal cancer Neg Hx     BP 130/80    Pulse 67    Ht 6\' 2"  (1.88 m)    Wt 200 lb (90.7 kg)    SpO2 98%    BMI 25.68 kg/m    Review of Systems Denies N/V/HB    Objective:   Physical Exam VITAL SIGNS:  See vs page GENERAL: no distress Pulses: dorsalis pedis intact bilat.   MSK: no deformity of the feet CV: no leg edema Skin:  no ulcer on the feet.  normal color and temp on the feet.   Neuro: sensation is intact to touch on the feet.     Lab Results  Component Value Date   HGBA1C 9.5 (A) 01/24/2022      Assessment & Plan:  Insulin-requiring type 2 DM: uncontrolled Noncompliance with cbg recording: I'll work around this as best I can.    Patient Instructions  check your blood sugar twice a day.  vary the time of day when you check, between before the 3 meals, and at bedtime.  also check if you have symptoms of your blood sugar being too high or too low.  please keep a record of the readings and bring it to your next appointment here (or you can bring the meter itself).  You can write it on any piece of paper.  please call us sooner if your blood sugar goes below 70, or if you have a lot of readings over 200.   I have sent a prescription to your pharmacy, to add Victoza, and:  Please continue the same insulins.  On this type of insulin schedule, you should eat meals on a regular schedule.  In particular, you should eat lunch by noon.  If a meal is missed or significantly delayed, your blood sugar could go low.   Please come back for a follow-up appointment in 2 months.

## 2022-01-24 NOTE — Patient Instructions (Addendum)
check your blood sugar twice a day.  vary the time of day when you check, between before the 3 meals, and at bedtime.  also check if you have symptoms of your blood sugar being too high or too low.  please keep a record of the readings and bring it to your next appointment here (or you can bring the meter itself).  You can write it on any piece of paper.  please call us sooner if your blood sugar goes below 70, or if you have a lot of readings over 200.   I have sent a prescription to your pharmacy, to add Victoza, and:  Please continue the same insulins.  On this type of insulin schedule, you should eat meals on a regular schedule.  In particular, you should eat lunch by noon.  If a meal is missed or significantly delayed, your blood sugar could go low.   Please come back for a follow-up appointment in 2 months.

## 2022-01-28 ENCOUNTER — Telehealth: Payer: Self-pay | Admitting: Internal Medicine

## 2022-01-28 DIAGNOSIS — E119 Type 2 diabetes mellitus without complications: Secondary | ICD-10-CM | POA: Diagnosis not present

## 2022-01-28 DIAGNOSIS — H35371 Puckering of macula, right eye: Secondary | ICD-10-CM | POA: Diagnosis not present

## 2022-01-28 DIAGNOSIS — H5203 Hypermetropia, bilateral: Secondary | ICD-10-CM | POA: Diagnosis not present

## 2022-01-28 DIAGNOSIS — H401131 Primary open-angle glaucoma, bilateral, mild stage: Secondary | ICD-10-CM | POA: Diagnosis not present

## 2022-01-28 LAB — HM DIABETES EYE EXAM

## 2022-01-28 NOTE — Telephone Encounter (Signed)
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  Call [phone] for pick up.

## 2022-01-29 ENCOUNTER — Encounter: Payer: Medicare HMO | Admitting: Internal Medicine

## 2022-01-29 NOTE — Telephone Encounter (Signed)
Placed in Dr Hernandez's folder 

## 2022-01-31 NOTE — Telephone Encounter (Signed)
Left message on machine for patient that forms are complete and ready to be picked up

## 2022-02-15 ENCOUNTER — Telehealth: Payer: Self-pay

## 2022-02-15 NOTE — Telephone Encounter (Signed)
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 ?

## 2022-02-18 ENCOUNTER — Telehealth: Payer: Self-pay | Admitting: Internal Medicine

## 2022-02-18 NOTE — Telephone Encounter (Signed)
Patient stopped by to drop off paperwork that needed to be filled out by by Dr.Hernandez. Patient would like to pick up paperwork when it is completed, please do not mail. If patient does not answer phone, please leave voicemail stating paperwork is ready for pickup. Paperwork placed in folder for completion  ? ? ? ? ?Please advise  ?

## 2022-02-19 NOTE — Telephone Encounter (Signed)
Placed in Dr Hernandez's folder 

## 2022-02-20 NOTE — Telephone Encounter (Signed)
Form is complete and patient is aware. 

## 2022-02-22 ENCOUNTER — Other Ambulatory Visit: Payer: Self-pay | Admitting: Internal Medicine

## 2022-02-22 DIAGNOSIS — Z0000 Encounter for general adult medical examination without abnormal findings: Secondary | ICD-10-CM

## 2022-03-01 ENCOUNTER — Other Ambulatory Visit: Payer: Self-pay

## 2022-03-01 DIAGNOSIS — E108 Type 1 diabetes mellitus with unspecified complications: Secondary | ICD-10-CM

## 2022-03-01 MED ORDER — INSULIN DETEMIR 100 UNIT/ML ~~LOC~~ SOLN: 56.0000 [IU] | SUBCUTANEOUS | 3 refills | Status: DC

## 2022-03-18 ENCOUNTER — Ambulatory Visit (INDEPENDENT_AMBULATORY_CARE_PROVIDER_SITE_OTHER): Payer: Medicare HMO | Admitting: Endocrinology

## 2022-03-18 ENCOUNTER — Encounter: Payer: Self-pay | Admitting: Endocrinology

## 2022-03-18 VITALS — BP 130/78 | Ht 74.0 in | Wt 193.0 lb

## 2022-03-18 DIAGNOSIS — E108 Type 1 diabetes mellitus with unspecified complications: Secondary | ICD-10-CM

## 2022-03-18 DIAGNOSIS — E1142 Type 2 diabetes mellitus with diabetic polyneuropathy: Secondary | ICD-10-CM

## 2022-03-18 LAB — POCT GLYCOSYLATED HEMOGLOBIN (HGB A1C): Hemoglobin A1C: 10.8 % — AB (ref 4.0–5.6)

## 2022-03-18 MED ORDER — INSULIN ASPART 100 UNIT/ML IJ SOLN
8.0000 [IU] | Freq: Two times a day (BID) | INTRAMUSCULAR | 99 refills | Status: DC
Start: 2022-03-18 — End: 2022-03-25

## 2022-03-18 NOTE — Progress Notes (Signed)
? ?Subjective:  ? ? Patient ID: YOUSOF [name], male    DOB: 1946/12/12, 76 y.o.   MRN: [id number] ? ?HPI ?Pt returns for f/u of diabetes mellitus:  ?DM type: Insulin-requiring type 2 (but he may be evolving type 1).   ?Dx'ed: 2010.   ?Complications: PN and CRI.   ?Therapy: insulin since 2013.   ?DKA: never.   ?Severe hypoglycemia: last episode was in 2018.   ?Pancreatitis: never.  ?SDOH: he uses syringe and vial, after difficulty operating pen; he is back on 2 QD insulins, after noncompliance with multiple daily injections; ins declined Trulicity. He is retired.   ?Other: he could not take lantus, due to AM hypoglycemia and PM hyperglycemia; however, he changed back after poor results with faster-acting qd insulin; He declines V-GO.  He eats meals at 7AM, 1PM, and 6PM.   ?Interval history: pt says he takes only 2 injections per day.  He has not recently been able to obtain Levemir, so the only insulin he takes now is 4 units with breakfast.  I reviewed continuous glucose monitor data.  Glucose varies from 45-400.  It is in general highest at 9AM and 6PM-11PM.  It is lowest at 3-4AM, and less low at 1-2PM.  However, it varies widely at all times of day.   ?Past Medical History:  ?Diagnosis Date  ? Diabetes mellitus type II   ? GERD (gastroesophageal reflux disease)   ? Glaucoma   ? Hyperlipidemia   ? Hypertension   ? ? ?Past Surgical History:  ?Procedure Laterality Date  ? COLONOSCOPY    ? HERNIA REPAIR    ? [place]  ? ? ?Social History  ? ?Socioeconomic History  ? Marital status: Married  ?  Spouse name: Not on file  ? Number of children: Not on file  ? Years of education: Not on file  ? Highest education level: Not on file  ?Occupational History  ? Not on file  ?Tobacco Use  ? Smoking status: Never  ? Smokeless tobacco: Never  ?Vaping Use  ? Vaping Use: Never used  ?Substance and Sexual Activity  ? Alcohol use: No  ? Drug use: No  ? Sexual activity: Not on file  ?Other Topics Concern  ? Not  on file  ?Social History Narrative  ? Not on file  ? ?Social Determinants of Health  ? ?Financial Resource Strain: Not on file  ?Food Insecurity: Not on file  ?Transportation Needs: Not on file  ?Physical Activity: Not on file  ?Stress: Not on file  ?Social Connections: Not on file  ?Intimate Partner Violence: Not on file  ? ? ?No current facility-administered medications on file prior to visit.  ? ?Current Outpatient Medications on File Prior to Visit  ?Medication Sig Dispense Refill  ? Alcohol Swabs (B-D SINGLE USE SWABS REGULAR) PADS USE TO CHECK BLOOD SUGAR 2 TIMES PER DAY 200 each prn  ? ALPHAGAN P 0.1 % SOLN Place 1 drop into both eyes daily.     ? aspirin EC 81 MG EC tablet Take 81 mg by mouth daily.    ? atorvastatin (LIPITOR) 80 MG tablet Take 1 tablet by mouth once daily 90 tablet 1  ? CINNAMON PO Take 100 mg by mouth daily.    ? Coenzyme Q10 (CO Q10) 100 MG CAPS Take by mouth.    ? Continuous Blood Gluc Sensor (FREESTYLE LIBRE 14 DAY SENSOR) MISC USE 1  EVERY TWO WEEKS 6 each 0  ?  dorzolamide-timolol (COSOPT) 22.3-6.8 MG/ML ophthalmic solution Place 1 drop into both eyes 2 (two) times daily.     ? ezetimibe (ZETIA) 10 MG tablet Take 1 tablet by mouth once daily 90 tablet 1  ? glucagon 1 MG injection Inject 1 mg into the muscle once as needed. 1 each 12  ? hydrochlorothiazide (HYDRODIURIL) 25 MG tablet Take 1 tablet by mouth once daily 90 tablet 0  ? Insulin Syringe-Needle U-100 (BD VEO INSULIN SYRINGE U/F) 31G X 15/64" 1 ML MISC USE  SYRINGE TWICE DAILY 200 each 5  ? liraglutide (VICTOZA) 18 MG/3ML SOPN Inject 1.2 mg into the skin daily. 3 mL 3  ? lisinopril (ZESTRIL) 2.5 MG tablet TAKE 1 TABLET BY MOUTH ONCE DAILY(SCHEULE ANNUAL OFFICE EXAM) 90 tablet 0  ? Multiple Vitamins-Minerals (CENTRUM SILVER ULTRA MENS PO) Take 1 tablet by mouth daily.    ? sildenafil (REVATIO) 20 MG tablet Take 1 to 2 tabs 2 - 3 hours before sex 20 tablet 11  ? ? ?No Known Allergies ? ?Family History  ?Problem Relation Age of  Onset  ? Alcohol abuse Father   ? Diabetes Sister   ? Colon cancer Neg Hx   ? Esophageal cancer Neg Hx   ? Stomach cancer Neg Hx   ? Rectal cancer Neg Hx   ? ? ?BP 130/78   Ht '6\' 2"'$  (1.88 m)   Wt 193 lb (87.5 kg)   BMI 24.78 kg/m?  ? ? ?Review of Systems ? ?   ?Objective:  ? Physical Exam ?VITAL SIGNS:  See vs page ?GENERAL: no distress ?Pulses: dorsalis pedis intact bilat.   ?MSK: no deformity of the feet ?CV: no leg edema ?Skin:  no ulcer on the feet.  normal color and temp on the feet. ?Neuro: sensation is intact to touch on the feet ?Ext: there is bilateral onychomycosis of the toenails.  ? ? ?Lab Results  ?Component Value Date  ? HGBA1C 10.8 (A) 03/18/2022  ? ?   ?Assessment & Plan:  ?Insulin-requiring type 2 DM: uncontrolled.  ?Hypoglycemia, due to insulin: this limits aggressiveness of glycemic control ? ?Patient Instructions  ?check your blood sugar twice a day.  vary the time of day when Christopher [name] check, between before the 3 meals, and at bedtime.  also check if Christopher [name] have symptoms of your blood sugar being too high or too low.  please keep a record of the readings and bring it to your next appointment here (or Christopher [name] can bring the meter itself).  Christopher [name] can write it on any piece of paper.  please call us sooner if your blood sugar goes below 70, or if Christopher [name] have a lot of readings over 200.    ?Stop taking the Levemir, and: ?Change the Novolog to 8 units with breakfast, and with lunch, and: ?Please continue the same Victoza.  ?On this type of insulin schedule, Christopher [name] should eat meals on a regular schedule.  In particular, Christopher [name] should eat lunch by noon.  If a meal is missed or significantly delayed, your blood sugar could go low.   ?Please come back for a follow-up appointment in 1 month.   ? ? ?

## 2022-03-18 NOTE — Patient Instructions (Signed)
check your blood sugar twice a day.  vary the time of day when you check, between before the 3 meals, and at bedtime.  also check if you have symptoms of your blood sugar being too high or too low.  please keep a record of the readings and bring it to your next appointment here (or you can bring the meter itself).  You can write it on any piece of paper.  please call us sooner if your blood sugar goes below 70, or if you have a lot of readings over 200.    ?Stop taking the Levemir, and: ?Change the Novolog to 8 units with breakfast, and with lunch, and: ?Please continue the same Victoza.  ?On this type of insulin schedule, you should eat meals on a regular schedule.  In particular, you should eat lunch by noon.  If a meal is missed or significantly delayed, your blood sugar could go low.   ?Please come back for a follow-up appointment in 1 month.   ?

## 2022-03-21 ENCOUNTER — Encounter (HOSPITAL_COMMUNITY): Payer: Self-pay | Admitting: Emergency Medicine

## 2022-03-21 ENCOUNTER — Inpatient Hospital Stay (HOSPITAL_COMMUNITY)
Admission: EM | Admit: 2022-03-21 | Discharge: 2022-03-25 | DRG: 637 | Disposition: A | Discharge status: Home Health | Payer: Medicare HMO | Attending: Internal Medicine | Admitting: Internal Medicine

## 2022-03-21 ENCOUNTER — Other Ambulatory Visit: Payer: Self-pay

## 2022-03-21 ENCOUNTER — Emergency Department (HOSPITAL_COMMUNITY): Payer: Medicare HMO

## 2022-03-21 DIAGNOSIS — E1111 Type 2 diabetes mellitus with ketoacidosis with coma: Secondary | ICD-10-CM | POA: Diagnosis not present

## 2022-03-21 DIAGNOSIS — K219 Gastro-esophageal reflux disease without esophagitis: Secondary | ICD-10-CM | POA: Diagnosis not present

## 2022-03-21 DIAGNOSIS — I129 Hypertensive chronic kidney disease with stage 1 through stage 4 chronic kidney disease, or unspecified chronic kidney disease: Secondary | ICD-10-CM | POA: Diagnosis present

## 2022-03-21 DIAGNOSIS — E1122 Type 2 diabetes mellitus with diabetic chronic kidney disease: Secondary | ICD-10-CM | POA: Diagnosis not present

## 2022-03-21 DIAGNOSIS — Z79899 Other long term (current) drug therapy: Secondary | ICD-10-CM

## 2022-03-21 DIAGNOSIS — E860 Dehydration: Secondary | ICD-10-CM | POA: Diagnosis present

## 2022-03-21 DIAGNOSIS — E785 Hyperlipidemia, unspecified: Secondary | ICD-10-CM | POA: Diagnosis not present

## 2022-03-21 DIAGNOSIS — H409 Unspecified glaucoma: Secondary | ICD-10-CM | POA: Diagnosis present

## 2022-03-21 DIAGNOSIS — N1831 Chronic kidney disease, stage 3a: Secondary | ICD-10-CM | POA: Diagnosis present

## 2022-03-21 DIAGNOSIS — N179 Acute kidney failure, unspecified: Secondary | ICD-10-CM | POA: Diagnosis present

## 2022-03-21 DIAGNOSIS — I10 Essential (primary) hypertension: Secondary | ICD-10-CM

## 2022-03-21 DIAGNOSIS — Z794 Long term (current) use of insulin: Secondary | ICD-10-CM | POA: Diagnosis not present

## 2022-03-21 DIAGNOSIS — R680 Hypothermia, not associated with low environmental temperature: Secondary | ICD-10-CM | POA: Diagnosis not present

## 2022-03-21 DIAGNOSIS — Z833 Family history of diabetes mellitus: Secondary | ICD-10-CM

## 2022-03-21 DIAGNOSIS — R531 Weakness: Secondary | ICD-10-CM | POA: Diagnosis not present

## 2022-03-21 DIAGNOSIS — R739 Hyperglycemia, unspecified: Secondary | ICD-10-CM | POA: Diagnosis not present

## 2022-03-21 DIAGNOSIS — R112 Nausea with vomiting, unspecified: Secondary | ICD-10-CM | POA: Diagnosis not present

## 2022-03-21 DIAGNOSIS — E876 Hypokalemia: Secondary | ICD-10-CM | POA: Diagnosis not present

## 2022-03-21 DIAGNOSIS — G9341 Metabolic encephalopathy: Secondary | ICD-10-CM | POA: Diagnosis present

## 2022-03-21 DIAGNOSIS — E1110 Type 2 diabetes mellitus with ketoacidosis without coma: Secondary | ICD-10-CM | POA: Diagnosis not present

## 2022-03-21 DIAGNOSIS — E0811 Diabetes mellitus due to underlying condition with ketoacidosis with coma: Secondary | ICD-10-CM

## 2022-03-21 DIAGNOSIS — E875 Hyperkalemia: Secondary | ICD-10-CM | POA: Diagnosis present

## 2022-03-21 DIAGNOSIS — R0689 Other abnormalities of breathing: Secondary | ICD-10-CM | POA: Diagnosis not present

## 2022-03-21 DIAGNOSIS — R410 Disorientation, unspecified: Secondary | ICD-10-CM | POA: Diagnosis not present

## 2022-03-21 DIAGNOSIS — R000 Tachycardia, unspecified: Secondary | ICD-10-CM | POA: Diagnosis not present

## 2022-03-21 DIAGNOSIS — Z7982 Long term (current) use of aspirin: Secondary | ICD-10-CM | POA: Diagnosis not present

## 2022-03-21 DIAGNOSIS — R0902 Hypoxemia: Secondary | ICD-10-CM | POA: Diagnosis not present

## 2022-03-21 LAB — BASIC METABOLIC PANEL WITH GFR
BUN: 54 mg/dL — ABNORMAL HIGH (ref 8–23)
BUN: 54 mg/dL — ABNORMAL HIGH (ref 8–23)
CO2: <7 mmol/L — ABNORMAL LOW (ref 22–32)
CO2: <7 mmol/L — ABNORMAL LOW (ref 22–32)
Calcium: 9.3 mg/dL (ref 8.9–10.3)
Calcium: 9.6 mg/dL (ref 8.9–10.3)
Chloride: 98 mmol/L (ref 98–111)
Chloride: 97 mmol/L — ABNORMAL LOW (ref 98–111)
Creatinine, Ser: 2.9 mg/dL — ABNORMAL HIGH (ref 0.61–1.24)
Creatinine, Ser: 2.87 mg/dL — ABNORMAL HIGH (ref 0.61–1.24)
GFR, Estimated: 22 mL/min — ABNORMAL LOW
GFR, Estimated: 22 mL/min — ABNORMAL LOW
Glucose, Bld: 916 mg/dL (ref 70–99)
Glucose, Bld: 907 mg/dL (ref 70–99)
Potassium: 6.5 mmol/L (ref 3.5–5.1)
Potassium: 6.8 mmol/L (ref 3.5–5.1)
Sodium: 134 mmol/L — ABNORMAL LOW (ref 135–145)
Sodium: 134 mmol/L — ABNORMAL LOW (ref 135–145)

## 2022-03-21 LAB — CBC
HCT: 45.4 % (ref 39.0–52.0)
Hemoglobin: 14.9 g/dL (ref 13.0–17.0)
MCH: 27 pg (ref 26.0–34.0)
MCHC: 32.8 g/dL (ref 30.0–36.0)
MCV: 82.2 fL (ref 80.0–100.0)
Platelets: 373 10*3/uL (ref 150–400)
RBC: 5.52 MIL/uL (ref 4.22–5.81)
RDW: 15.6 % — ABNORMAL HIGH (ref 11.5–15.5)
WBC: 25.9 10*3/uL — ABNORMAL HIGH (ref 4.0–10.5)
nRBC: 0 % (ref 0.0–0.2)

## 2022-03-21 LAB — URINALYSIS, ROUTINE W REFLEX MICROSCOPIC
Bilirubin Urine: NEGATIVE
Glucose, UA: >=500 mg/dL — AB
Ketones, ur: 80 mg/dL — AB
Leukocytes,Ua: NEGATIVE
Nitrite: NEGATIVE
Protein, ur: NEGATIVE mg/dL
Specific Gravity, Urine: 1.022 (ref 1.005–1.030)
pH: 5 (ref 5.0–8.0)

## 2022-03-21 LAB — CBC WITH DIFFERENTIAL/PLATELET
Abs Immature Granulocytes: 0.17 10*3/uL — ABNORMAL HIGH (ref 0.00–0.07)
Basophils Absolute: 0 10*3/uL (ref 0.0–0.1)
Basophils Relative: 0 %
Eosinophils Absolute: 0 10*3/uL (ref 0.0–0.5)
Eosinophils Relative: 0 %
HCT: 42.5 % (ref 39.0–52.0)
Hemoglobin: 14 g/dL (ref 13.0–17.0)
Immature Granulocytes: 1 %
Lymphocytes Relative: 6 %
Lymphs Abs: 1.4 10*3/uL (ref 0.7–4.0)
MCH: 26.7 pg (ref 26.0–34.0)
MCHC: 32.9 g/dL (ref 30.0–36.0)
MCV: 81.1 fL (ref 80.0–100.0)
Monocytes Absolute: 1.2 10*3/uL — ABNORMAL HIGH (ref 0.1–1.0)
Monocytes Relative: 5 %
Neutro Abs: 19.8 10*3/uL — ABNORMAL HIGH (ref 1.7–7.7)
Neutrophils Relative %: 88 %
Platelets: 360 10*3/uL (ref 150–400)
RBC: 5.24 MIL/uL (ref 4.22–5.81)
RDW: 15.9 % — ABNORMAL HIGH (ref 11.5–15.5)
WBC: 22.6 10*3/uL — ABNORMAL HIGH (ref 4.0–10.5)
nRBC: 0 % (ref 0.0–0.2)

## 2022-03-21 LAB — CBG MONITORING, ED
Glucose-Capillary: 427 mg/dL — ABNORMAL HIGH (ref 70–99)
Glucose-Capillary: 499 mg/dL — ABNORMAL HIGH (ref 70–99)
Glucose-Capillary: 508 mg/dL (ref 70–99)
Glucose-Capillary: 530 mg/dL (ref 70–99)
Glucose-Capillary: 598 mg/dL (ref 70–99)
Glucose-Capillary: >600 mg/dL (ref 70–99)
Glucose-Capillary: >600 mg/dL (ref 70–99)
Glucose-Capillary: >600 mg/dL (ref 70–99)
Glucose-Capillary: >600 mg/dL (ref 70–99)
Glucose-Capillary: >600 mg/dL (ref 70–99)
Glucose-Capillary: >600 mg/dL (ref 70–99)
Glucose-Capillary: >600 mg/dL (ref 70–99)
Glucose-Capillary: >600 mg/dL (ref 70–99)
Glucose-Capillary: >600 mg/dL (ref 70–99)
Glucose-Capillary: >600 mg/dL (ref 70–99)

## 2022-03-21 LAB — I-STAT VENOUS BLOOD GAS, ED
Acid-base deficit: 26 mmol/L — ABNORMAL HIGH (ref 0.0–2.0)
Bicarbonate: 4.8 mmol/L — ABNORMAL LOW (ref 20.0–28.0)
Calcium, Ion: 1.11 mmol/L — ABNORMAL LOW (ref 1.15–1.40)
HCT: 46 % (ref 39.0–52.0)
Hemoglobin: 15.6 g/dL (ref 13.0–17.0)
O2 Saturation: 93 %
Potassium: 6.9 mmol/L (ref 3.5–5.1)
Sodium: 128 mmol/L — ABNORMAL LOW (ref 135–145)
TCO2: 5 mmol/L — ABNORMAL LOW (ref 22–32)
pCO2, Ven: 21.2 mmHg — ABNORMAL LOW (ref 44–60)
pH, Ven: 6.967 — CL (ref 7.25–7.43)
pO2, Ven: 100 mmHg — ABNORMAL HIGH (ref 32–45)

## 2022-03-21 LAB — BASIC METABOLIC PANEL
BUN: 52 mg/dL — ABNORMAL HIGH (ref 8–23)
CO2: <7 mmol/L — ABNORMAL LOW (ref 22–32)
Calcium: 9.1 mg/dL (ref 8.9–10.3)
Chloride: 95 mmol/L — ABNORMAL LOW (ref 98–111)
Creatinine, Ser: 2.98 mg/dL — ABNORMAL HIGH (ref 0.61–1.24)
GFR, Estimated: 21 mL/min — ABNORMAL LOW (ref >60)
Glucose, Bld: 989 mg/dL (ref 70–99)
Potassium: 6.9 mmol/L (ref 3.5–5.1)
Sodium: 130 mmol/L — ABNORMAL LOW (ref 135–145)

## 2022-03-21 LAB — LIPASE, BLOOD
Lipase: 20 U/L (ref 11–51)
Lipase: 19 U/L (ref 11–51)

## 2022-03-21 LAB — BETA-HYDROXYBUTYRIC ACID
Beta-Hydroxybutyric Acid: >8 mmol/L — ABNORMAL HIGH (ref 0.05–0.27)
Beta-Hydroxybutyric Acid: >8 mmol/L — ABNORMAL HIGH (ref 0.05–0.27)

## 2022-03-21 LAB — TROPONIN I (HIGH SENSITIVITY)
Troponin I (High Sensitivity): 77 ng/L — ABNORMAL HIGH (ref <18)
Troponin I (High Sensitivity): 136 ng/L (ref <18)

## 2022-03-21 MED ORDER — DOCUSATE SODIUM 100 MG PO CAPS
100.0000 mg | ORAL_CAPSULE | Freq: Two times a day (BID) | ORAL | Status: DC | PRN
Start: 2022-03-21 — End: 2022-03-25

## 2022-03-21 MED ORDER — DEXMEDETOMIDINE HCL IN NACL 400 MCG/100ML IV SOLN
0.2000 ug/kg/h | INTRAVENOUS | Status: DC
  Administered 2022-03-21: 0.2 ug/kg/h via INTRAVENOUS
  Filled 2022-03-21: qty 100

## 2022-03-21 MED ORDER — SODIUM BICARBONATE 8.4 % IV SOLN
50.0000 meq | Freq: Once | INTRAVENOUS | Status: AC
  Administered 2022-03-21: 50 meq via INTRAVENOUS
  Filled 2022-03-21: qty 50

## 2022-03-21 MED ORDER — ONDANSETRON HCL 4 MG/2ML IJ SOLN: 4.0000 mg | Freq: Four times a day (QID) | INTRAMUSCULAR | Status: DC | PRN

## 2022-03-21 MED ORDER — CALCIUM GLUCONATE-NACL 1-0.675 GM/50ML-% IV SOLN
1.0000 g | Freq: Once | INTRAVENOUS | Status: AC
  Administered 2022-03-21: 1000 mg via INTRAVENOUS
  Filled 2022-03-21: qty 50

## 2022-03-21 MED ORDER — LACTATED RINGERS IV BOLUS
20.0000 mL/kg | Freq: Once | INTRAVENOUS | Status: AC
  Administered 2022-03-21: 1724 mL via INTRAVENOUS

## 2022-03-21 MED ORDER — SODIUM CHLORIDE 0.9 % IV BOLUS
1000.0000 mL | Freq: Once | INTRAVENOUS | Status: AC
  Administered 2022-03-21: 1000 mL via INTRAVENOUS

## 2022-03-21 MED ORDER — INSULIN REGULAR(HUMAN) IN NACL 100-0.9 UT/100ML-% IV SOLN
INTRAVENOUS | Status: AC
  Administered 2022-03-21: 10.5 [IU]/h via INTRAVENOUS
  Administered 2022-03-21: 11 [IU]/h via INTRAVENOUS
  Filled 2022-03-21 (×2): qty 100

## 2022-03-21 MED ORDER — HEPARIN SODIUM (PORCINE) 5000 UNIT/ML IJ SOLN
5000.0000 [IU] | Freq: Three times a day (TID) | INTRAMUSCULAR | Status: DC
  Administered 2022-03-21 – 2022-03-25 (×12): 5000 [IU] via SUBCUTANEOUS
  Filled 2022-03-21 (×12): qty 1

## 2022-03-21 MED ORDER — POLYETHYLENE GLYCOL 3350 17 G PO PACK: 17.0000 g | PACK | Freq: Every day | ORAL | Status: DC | PRN

## 2022-03-21 MED ORDER — DEXTROSE 50 % IV SOLN: 0.0000 mL | INTRAVENOUS | Status: DC | PRN

## 2022-03-21 MED ORDER — LACTATED RINGERS IV BOLUS
1000.0000 mL | Freq: Once | INTRAVENOUS | Status: AC
  Administered 2022-03-21: 1000 mL via INTRAVENOUS

## 2022-03-21 MED ORDER — DEXTROSE IN LACTATED RINGERS 5 % IV SOLN: INTRAVENOUS | Status: DC

## 2022-03-21 MED ORDER — DEXMEDETOMIDINE HCL IN NACL 400 MCG/100ML IV SOLN: 0.4000 ug/kg/h | INTRAVENOUS | Status: DC

## 2022-03-21 MED ORDER — LORAZEPAM 2 MG/ML IJ SOLN
1.0000 mg | Freq: Once | INTRAMUSCULAR | Status: AC
  Administered 2022-03-21: 1 mg via INTRAVENOUS
  Filled 2022-03-21: qty 1

## 2022-03-21 MED ORDER — LACTATED RINGERS IV SOLN
INTRAVENOUS | Status: DC
  Administered 2022-03-21: 125 mL/h via INTRAVENOUS

## 2022-03-21 MED ORDER — SODIUM ZIRCONIUM CYCLOSILICATE 10 G PO PACK
10.0000 g | PACK | Freq: Once | ORAL | Status: AC
  Administered 2022-03-21: 10 g via ORAL
  Filled 2022-03-21: qty 1

## 2022-03-21 NOTE — ED Provider Notes (Signed)
?[facility] ?Provider Note ? ? ?CSN: [id number] ?Arrival date & time: 03/21/22  1317 ? ?  ? ?History ? ?Chief Complaint  ?Patient presents with  ? Weakness  ? Nausea  ? ? ?Christopher [name] is a 76 y.o. male. ? ?Patient is a 76 year old male with a history of diabetes, hypertension, hyperlipidemia and GERD who is presenting today with his family member due to feeling generally unwell.  They reported they went to [place] for 10 days and came back yesterday.  However for the last 2 days he has not been feeling well.  He has had diffuse abdominal cramping, occasional vomiting and poor oral intake.  He typically does shots for his diabetes but has not done any because they could not get him to eat anything.  He denies any recent fever, cough, congestion.  No diarrhea.  Diet has been changed since he has been traveling but denies any infectious symptoms.  He complains of polyuria and polydipsia.  Also family has noticed he seemed a little bit confused today. ? ?The history is provided by the patient, a relative and medical records.  ?Weakness ? ?  ? ?Home Medications ?Prior to Admission medications   ?Medication Sig Start Date End Date Taking? Authorizing Provider  ?Alcohol Swabs (B-D SINGLE USE SWABS REGULAR) PADS USE TO CHECK BLOOD SUGAR 2 TIMES PER DAY 01/07/18   Renato Shin, MD  ?[facility] P 0.1 % SOLN Place 1 drop into both eyes daily.  03/03/11   [provider]  ?aspirin EC 81 MG EC tablet Take 81 mg by mouth daily.    [provider]  ?atorvastatin (LIPITOR) 80 MG tablet Take 1 tablet by mouth once daily 10/30/20   Isaac Bliss, Rayford Halsted, MD  ?CINNAMON PO Take 100 mg by mouth daily.    [provider]  ?Coenzyme Q10 (CO Q10) 100 MG CAPS Take by mouth.    [provider]  ?Continuous Blood Gluc Sensor (FREESTYLE LIBRE 14 DAY SENSOR) MISC USE 1  EVERY TWO WEEKS 12/27/21   Renato Shin, MD  ?dorzolamide-timolol (COSOPT) 22.3-6.8 MG/ML  ophthalmic solution Place 1 drop into both eyes 2 (two) times daily.  03/03/11   [provider]  ?ezetimibe (ZETIA) 10 MG tablet Take 1 tablet by mouth once daily 08/07/21   Isaac Bliss, Rayford Halsted, MD  ?glucagon 1 MG injection Inject 1 mg into the muscle once as needed. 03/12/16   Renato Shin, MD  ?hydrochlorothiazide (HYDRODIURIL) 25 MG tablet Take 1 tablet by mouth once daily 02/22/22   Isaac Bliss, Rayford Halsted, MD  ?insulin aspart (NOVOLOG) 100 UNIT/ML injection Inject 8 Units into the skin 2 (two) times daily with a meal. With breakfast and with lunch. 03/18/22   Renato Shin, MD  ?Insulin Syringe-Needle U-100 (BD VEO INSULIN SYRINGE U/F) 31G X 15/64" 1 ML MISC USE  SYRINGE TWICE DAILY 12/24/21   Renato Shin, MD  ?liraglutide (VICTOZA) 18 MG/3ML SOPN Inject 1.2 mg into the skin daily. 01/24/22   Renato Shin, MD  ?lisinopril (ZESTRIL) 2.5 MG tablet TAKE 1 TABLET BY MOUTH ONCE DAILY(SCHEULE ANNUAL OFFICE EXAM) 12/01/20   Isaac Bliss, Rayford Halsted, MD  ?Multiple Vitamins-Minerals (CENTRUM SILVER ULTRA MENS PO) Take 1 tablet by mouth daily.    [provider]  ?sildenafil (REVATIO) 20 MG tablet Take 1 to 2 tabs 2 - 3 hours before sex 10/06/19   Isaac Bliss, Rayford Halsted, MD  ?   ? ?Allergies    ?Patient  has no known allergies.   ? ?Review of Systems   ?Review of Systems  ?Neurological:  Positive for weakness.  ? ?Physical Exam ?Updated Vital Signs ?BP 135/77   Pulse (!) 154   Temp (!) 97.5 ?F (36.4 ?C) (Oral)   Resp (!) 27   Ht '6\' 2"'$  (1.88 m)   Wt 86.2 kg   SpO2 100%   BMI 24.39 kg/m?  ?Physical Exam ?Vitals and nursing note reviewed.  ?Constitutional:   ?   General: He is not in acute distress. ?   Appearance: He is well-developed. He is ill-appearing.  ?HENT:  ?   Head: Normocephalic and atraumatic.  ?   Mouth/Throat:  ?   Mouth: Mucous membranes are dry.  ?Eyes:  ?   Conjunctiva/sclera: Conjunctivae normal.  ?   Pupils: Pupils are equal, round, and reactive to light.   ?Cardiovascular:  ?   Rate and Rhythm: Regular rhythm. Tachycardia present.  ?   Heart sounds: No murmur heard. ?Pulmonary:  ?   Effort: Pulmonary effort is normal. No respiratory distress.  ?   Breath sounds: Normal breath sounds. No wheezing or rales.  ?   Comments: Significant tachypnea ?Abdominal:  ?   General: There is no distension.  ?   Palpations: Abdomen is soft.  ?   Tenderness: There is no abdominal tenderness. There is no guarding or rebound.  ?Musculoskeletal:     ?   General: No tenderness. Normal range of motion.  ?   Cervical back: Normal range of motion and neck supple.  ?   Right lower leg: No edema.  ?   Left lower leg: No edema.  ?Skin: ?   General: Skin is warm and dry.  ?   Findings: No erythema or rash.  ?Neurological:  ?   Mental Status: He is alert and oriented to person, place, and time.  ?Psychiatric:     ?   Mood and Affect: Mood normal.     ?   Behavior: Behavior normal.  ? ? ?ED Results / Procedures / Treatments   ?Labs ?(all labs ordered are listed, but only abnormal results are displayed) ?Labs Reviewed  ?CBC WITH DIFFERENTIAL/PLATELET - Abnormal; Notable for the following components:  ?    Result Value  ? WBC 22.6 (*)   ? RDW 15.9 (*)   ? Neutro Abs 19.8 (*)   ? Monocytes Absolute 1.2 (*)   ? Abs Immature Granulocytes 0.17 (*)   ? All other components within normal limits  ?URINALYSIS, ROUTINE W REFLEX MICROSCOPIC - Abnormal; Notable for the following components:  ? Color, Urine STRAW (*)   ? Glucose, UA >=500 (*)   ? Hgb urine dipstick MODERATE (*)   ? Ketones, ur 80 (*)   ? Bacteria, UA RARE (*)   ? All other components within normal limits  ?CBG MONITORING, ED - Abnormal; Notable for the following components:  ? Glucose-Capillary >600 (*)   ? All other components within normal limits  ?I-STAT VENOUS BLOOD GAS, ED - Abnormal; Notable for the following components:  ? pH, Ven 6.967 (*)   ? pCO2, Ven 21.2 (*)   ? pO2, Ven 100 (*)   ? Bicarbonate 4.8 (*)   ? TCO2 5 (*)   ? Acid-base  deficit 26.0 (*)   ? Sodium 128 (*)   ? Potassium 6.9 (*)   ? Calcium, Ion 1.11 (*)   ? All other components within normal limits  ?BASIC METABOLIC PANEL  ?  BASIC METABOLIC PANEL  ?BASIC METABOLIC PANEL  ?BASIC METABOLIC PANEL  ?BETA-HYDROXYBUTYRIC ACID  ?BETA-HYDROXYBUTYRIC ACID  ? ? ?EKG ?None ? ?Radiology ?No results found. ? ?Procedures ?Procedures  ? ? ?Medications Ordered in ED ?Medications  ?insulin regular, human (MYXREDLIN) 100 units/ 100 mL infusion (has no administration in time range)  ?lactated ringers infusion (has no administration in time range)  ?dextrose 5 % in lactated ringers infusion (has no administration in time range)  ?dextrose 50 % solution 0-50 mL (has no administration in time range)  ?lactated ringers bolus 1,724 mL (1,724 mLs Intravenous New Bag/Given 03/21/22 1457)  ? ? ?ED Course/ Medical Decision Making/ A&P ?  ?                        ?Medical Decision Making ?Amount and/or Complexity of Data Reviewed ?Independent Historian: spouse ?External Data Reviewed: notes. ?Labs: ordered. Decision-making details documented in ED Course. ?Radiology: ordered and independent interpretation performed. Decision-making details documented in ED Course. ? ?Risk ?Prescription drug management. ? ? ?Patient is a 76 year old male presenting today with symptoms concerning for DKA.  Patient is tachycardic, tachypneic and smells of ketones.  Blood sugar is reading greater than 600.  Patient has no focal abdominal pain and has denied any recent infectious symptoms.  He did recently travel to [place] and got back yesterday could be related to dietary changes and not keeping up on his insulin.  Patient also appears significantly dehydrated.  He was started on IV fluids, labs are pending.  Plan on starting the Endo tool once potassium levels are known. ? ?3:30 PM ?I independently interpreted patient's labs.  White count of 22,000, UA with ketones but no evidence of infection, blood sugar greater than 600, VBG  with a metabolic acidosis with a pH of 6.9, bicarb of 4.8, CO2 of 21 and a potassium of 6.9.  Patient received 1 L of fluid and then Endo tool was started with IV insulin.  CMP is still pending as well as beta hydroxybutyric acid.  Pa

## 2022-03-21 NOTE — ED Notes (Signed)
Pt is on endo tool, altered, is agitated. ?

## 2022-03-21 NOTE — H&P (Signed)
? ?NAME:  Christopher [name], MRN:  [id number], DOB:  27-Mar-1946, LOS: 0 ?ADMISSION DATE:  03/21/2022, CONSULTATION DATE:  03/21/2022   ?REFERRING MD:  Dr. Laverta Baltimore, CHIEF COMPLAINT:  DKA   ? ?History of Present Illness:  ?Christopher [name] is a 76 y.o. male with a PMH significant for type II diabetes, HTN, HLD, and GERD who presented to the ED for complaints of generally feeling unwell.  Per spouse patient has been intermittently confused over the last 2 days with increased urination and vomiting x1 day prior to admission.  Spouse does state she and patient traveled to [place] for 10 days with recent travel back.  Denies any known sick contacts.  Patient manages home diabetes regiment and wife is unsure if patient was compliant with regiment during vacation.  Wife states that glucose on home reading was greater than 300 Tuesday and this was the last reading she was aware of. ? ?On ED arrival patient was seen mildly hypothermic with temperature 97.5, tachypneic, and tachycardic. Lab work indicated severe abnormalities consistent with DKA including pH 6.9, sodium 130, potassium 6.9, chloride 96, CO2 less than 4, glucose 989, creatinine 2.96, WBC 22.6, beta hydroxybutyrate acid greater than 8.  Given severe DKA with pH 6.9 PCCM consulted for further management admission ? ?Pertinent  Medical History  ?type II diabetes, HTN, HLD, and GERD ? ?Significant Hospital Events: ?Including procedures, antibiotic start and stop dates in addition to other pertinent events   ?4/6 admitted with generalized weakness, confusion, tachypnea, and frequent urination found to be in severe DKA with pH 6.9, potassium also 6.9 ? ?Interim History / Subjective:  ?As above  ? ?Objective   ?Blood pressure 125/70, pulse 66, temperature (!) 97.5 ?F (36.4 ?C), temperature source Oral, resp. rate (!) 28, height '6\' 2"'$  (1.88 m), weight 86.2 kg, SpO2 96 %. ?   ?   ?No intake or output data in the 24 hours ending 03/21/22 1633 ?Filed Weights  ? 03/21/22 1331  ?Weight:  86.2 kg  ? ? ?Examination: ?General: Acute ill appearing thin elderly male lying in bed, in NAD ?HEENT: ETT, MM pink/moist, PERRL,  ?Neuro: encephalopathic  ?CV: s1s2 regular rate and rhythm, no murmur, rubs, or gallops,  ?PULM:  Clear to ascultation, tachypnea ?GI: soft, bowel sounds active in all 4 quadrants, non-tender, non-distended ?Extremities: warm/dry, no edema  ?Skin: no rashes or lesions ? ?Resolved Hospital Problem list   ? ? ?Assessment & Plan:  ?Diabetic ketoacidosis  ?-pH 6.9, CO2 <4, Anion Gap unable to calculate, with altered metal status  metting criteria for Severe DKA  ?-Beta-hydroxybutyric acid >8 ?P: ?Aggressive IV hydration  ?Insulin drip  ?Closely monitor patient's electrolytes with frequent Bmets especially potassium ?Maintain potassium greater than 4 ?Accu-Cheks every 1 hour ?Once blood glucose falls below 250 start patient on D5 IV fluids ?When amion gap closes and patient is able to tolerate oral diet transition to subcu long-acting insulin in slowly turned drip off over the next 1-2 hours ?Monitor renal function ?Monitor pH if falls below 7 consider starting bicarb bolus/drip ?Monitor lactate ?Recent hemoglobin A1c 10.8 03/18/2022 ?NPO/noncaloric clears until gap closed ?Resume home insulin regiment once appropriate ?  ?Acute metabolic encephalopathy  ?P: ?Maintain neuro protective measures ?Nutrition and bowel regiment  ?Seizure precautions  ?Aspirations precautions  ?Correct underlying metabolic derangements  ? ?Hx of HTN/HLD ?-Med recc pending ?P: ?Resume home meds when appropriate  ?Continuous telemetry ?Continue home statin and ASA when med rec completed  ? ? ?Best  Practice (right click and "Reselect all SmartList Selections" daily)  ? ?Diet/type: NPO ?DVT prophylaxis: prophylactic heparin  ?GI prophylaxis: PPI ?Lines: N/A ?Foley:  N/A ?Code Status:  full code ?Last date of multidisciplinary goals of care discussion: Wife updated at bedside  ? ?Labs   ?CBC: ?Recent Labs  ?Lab  03/21/22 ?1445 03/21/22 ?1507  ?WBC 22.6*  --   ?NEUTROABS 19.8*  --   ?HGB 14.0 15.6  ?HCT 42.5 46.0  ?MCV 81.1  --   ?PLT 360  --   ? ? ?Basic Metabolic Panel: ?Recent Labs  ?Lab 03/21/22 ?1445 03/21/22 ?1507  ?NA 130* 128*  ?K 6.9* 6.9*  ?CL 95*  --   ?CO2 <4*  --   ?GLUCOSE 989*  --   ?BUN 52*  --   ?CREATININE 2.98*  --   ?CALCIUM 9.1  --   ? ?GFR: ?Estimated Creatinine Clearance: 25.3 mL/min (A) (by C-G formula based on SCr of 2.98 mg/dL (H)). ?Recent Labs  ?Lab 03/21/22 ?1445  ?WBC 22.6*  ? ? ?Liver Function Tests: ?No results for input(s): AST, ALT, ALKPHOS, BILITOT, PROT, ALBUMIN in the last 168 hours. ?No results for input(s): LIPASE, AMYLASE in the last 168 hours. ?No results for input(s): AMMONIA in the last 168 hours. ? ?ABG ?   ?Component Value Date/Time  ? HCO3 4.8 (L) 03/21/2022 1507  ? TCO2 5 (L) 03/21/2022 1507  ? ACIDBASEDEF 26.0 (H) 03/21/2022 1507  ? O2SAT 93 03/21/2022 1507  ?  ? ?Coagulation Profile: ?No results for input(s): INR, PROTIME in the last 168 hours. ? ?Cardiac Enzymes: ?No results for input(s): CKTOTAL, CKMB, CKMBINDEX, TROPONINI in the last 168 hours. ? ?HbA1C: ?Hemoglobin A1C  ?Date/Time Value Ref Range Status  ?03/18/2022 03:08 PM 10.8 (A) 4.0 - 5.6 % Final  ?01/24/2022 07:43 AM 9.5 (A) 4.0 - 5.6 % Final  ? ?Hgb A1c MFr Bld  ?Date/Time Value Ref Range Status  ?03/25/2017 09:14 AM 9.2 (H) 4.6 - 6.5 % Final  ?  Comment:  ?  Glycemic Control Guidelines for People with Diabetes:Non Diabetic:  <6%Goal of Therapy: <7%Additional Action Suggested:  >8%   ?01/01/2016 08:57 AM 9.1 (H) 4.6 - 6.5 % Final  ?  Comment:  ?  Glycemic Control Guidelines for People with Diabetes:Non Diabetic:  <6%Goal of Therapy: <7%Additional Action Suggested:  >8%   ? ? ?CBG: ?Recent Labs  ?Lab 03/21/22 ?1401 03/21/22 ?1559  ?GLUCAP >600* >600*  ? ? ?Review of Systems:   ?Unable to obtain due to confusion  ? ?Past Medical History:  ?He,  has a past medical history of Diabetes mellitus type II, GERD  (gastroesophageal reflux disease), Glaucoma, Hyperlipidemia, and Hypertension.  ? ?Surgical History:  ? ?Past Surgical History:  ?Procedure Laterality Date  ? COLONOSCOPY    ? HERNIA REPAIR    ? [place]  ?  ? ?Social History:  ? reports that he has never smoked. He has never used smokeless tobacco. He reports that he does not drink alcohol and does not use drugs.  ? ?Family History:  ?His family history includes Alcohol abuse in his father; Diabetes in his sister. There is no history of Colon cancer, Esophageal cancer, Stomach cancer, or Rectal cancer.  ? ?Allergies ?No Known Allergies  ? ?Home Medications  ?Prior to Admission medications   ?Medication Sig Start Date End Date Taking? Authorizing Provider  ?Alcohol Swabs (B-D SINGLE USE SWABS REGULAR) PADS USE TO CHECK BLOOD SUGAR 2 TIMES PER DAY 01/07/18  Renato Shin, MD  ?[facility] P 0.1 % SOLN Place 1 drop into both eyes daily.  03/03/11   [provider]  ?aspirin EC 81 MG EC tablet Take 81 mg by mouth daily.    [provider]  ?atorvastatin (LIPITOR) 80 MG tablet Take 1 tablet by mouth once daily 10/30/20   Isaac Bliss, Rayford Halsted, MD  ?CINNAMON PO Take 100 mg by mouth daily.    [provider]  ?Coenzyme Q10 (CO Q10) 100 MG CAPS Take by mouth.    [provider]  ?Continuous Blood Gluc Sensor (FREESTYLE LIBRE 14 DAY SENSOR) MISC USE 1  EVERY TWO WEEKS 12/27/21   Renato Shin, MD  ?dorzolamide-timolol (COSOPT) 22.3-6.8 MG/ML ophthalmic solution Place 1 drop into both eyes 2 (two) times daily.  03/03/11   [provider]  ?ezetimibe (ZETIA) 10 MG tablet Take 1 tablet by mouth once daily 08/07/21   Isaac Bliss, Rayford Halsted, MD  ?glucagon 1 MG injection Inject 1 mg into the muscle once as needed. 03/12/16   Renato Shin, MD  ?hydrochlorothiazide (HYDRODIURIL) 25 MG tablet Take 1 tablet by mouth once daily 02/22/22   Isaac Bliss, Rayford Halsted, MD  ?insulin aspart (NOVOLOG) 100 UNIT/ML injection  Inject 8 Units into the skin 2 (two) times daily with a meal. With breakfast and with lunch. 03/18/22   Renato Shin, MD  ?Insulin Syringe-Needle U-100 (BD VEO INSULIN SYRINGE U/F) 31G X 15/64" 1 ML MISC USE  SYRINGE TWIC

## 2022-03-21 NOTE — ED Notes (Signed)
Dewald MD at bedside.  ?

## 2022-03-21 NOTE — ED Provider Notes (Signed)
Blood pressure 129/64, pulse (!) 36, temperature (!) 97.5 ?F (36.4 ?C), temperature source Oral, resp. rate (!) 25, height '6\' 2"'$  (1.88 m), weight 86.2 kg, SpO2 100 %. ? ?Assuming care from Dr. Maryan Rued.  In short, Christopher [name] is a 76 y.o. male with a chief complaint of Weakness and Nausea ?Marland Kitchen  Refer to the original H&P for additional details. ? ?The current plan of care is to follow up on labs. Patient with severe acidosis, likely DKA. ? ?04:40 PM  ?Patient's lab work is significantly abnormal showing severe acidosis with hyperkalemia showing 6.9.  Glucose is 989 with CO2 less than 4.  IV fluids and insulin infusion started previously.  I have placed orders for Lokelma, calcium, bicarbonate and attempt to shift his potassium.  ? ?Discussed patient's case with ICU to request admission. Patient and family (if present) updated with plan.  ? ?I reviewed all nursing notes, vitals, pertinent old records, EKGs, labs, imaging (as available). ? ? EKG Interpretation ? ?Date/Time:  Thursday March 21 2022 16:52:51 EDT ?Ventricular Rate:  111 ?PR Interval:    ?QRS Duration: 113 ?QT Interval:  356 ?QTC Calculation: 486 ?R Axis:   66 ?Text Interpretation: Sinus tachycardia Borderline intraventricular conduction delay ST elevation, consider anterior injury Borderline prolonged QT interval Confirmed by Nanda Quinton [phone]) on 03/21/2022 5:04:39 PM ?  ? ?  ? ?CRITICAL CARE ?Performed by: Margette Fast ?Total critical care time: 45 minutes ?Critical care time was exclusive of separately billable procedures and treating other patients. ?Critical care was necessary to treat or prevent imminent or life-threatening deterioration. ?Critical care was time spent personally by me on the following activities: development of treatment plan with patient and/or surrogate as well as nursing, discussions with consultants, evaluation of patient's response to treatment, examination of patient, obtaining history from patient or surrogate, ordering and  performing treatments and interventions, ordering and review of laboratory studies, ordering and review of radiographic studies, pulse oximetry and re-evaluation of patient's condition. ? ?Nanda Quinton, MD ?Emergency Medicine ? ? ? ? ?  ?Margette Fast, MD ?03/21/22 1705 ? ?

## 2022-03-21 NOTE — ED Notes (Signed)
Earney Mallet, NP contacted regarding pts mental status, pt pulling IV out and trying to crawl out of bed. Med orders placed.  ?

## 2022-03-21 NOTE — ED Notes (Signed)
Pt resting.

## 2022-03-21 NOTE — ED Triage Notes (Signed)
Pt BIB [facility] from home. Pt recently traveled out of state and came home Sunday, pt has had generalized weakness/lethargy since. N/V since yesterday, pt denies diarrhea. Has been unable to keep anything down. Denies pain, denies sick contacts. Pt respirations tachypneic but unlabored. Given 513m NS and '4mg'$  zofran IV by EMS. 18g L AC. A/O x3, disoriented to time.   ? ?EMS VS- 134/74, HR 118, RR 28, 99% RA, CBG 342 (hx type II diabetes) ?

## 2022-03-22 ENCOUNTER — Inpatient Hospital Stay (HOSPITAL_COMMUNITY): Payer: Medicare HMO

## 2022-03-22 DIAGNOSIS — E876 Hypokalemia: Secondary | ICD-10-CM

## 2022-03-22 LAB — BASIC METABOLIC PANEL
Anion gap: 12 (ref 5–15)
Anion gap: 18 — ABNORMAL HIGH (ref 5–15)
Anion gap: 6 (ref 5–15)
Anion gap: 6 (ref 5–15)
Anion gap: 7 (ref 5–15)
Anion gap: 8 (ref 5–15)
BUN: 30 mg/dL — ABNORMAL HIGH (ref 8–23)
BUN: 35 mg/dL — ABNORMAL HIGH (ref 8–23)
BUN: 40 mg/dL — ABNORMAL HIGH (ref 8–23)
BUN: 43 mg/dL — ABNORMAL HIGH (ref 8–23)
BUN: 47 mg/dL — ABNORMAL HIGH (ref 8–23)
BUN: 49 mg/dL — ABNORMAL HIGH (ref 8–23)
CO2: 11 mmol/L — ABNORMAL LOW (ref 22–32)
CO2: 14 mmol/L — ABNORMAL LOW (ref 22–32)
CO2: 21 mmol/L — ABNORMAL LOW (ref 22–32)
CO2: 21 mmol/L — ABNORMAL LOW (ref 22–32)
CO2: 22 mmol/L (ref 22–32)
CO2: 22 mmol/L (ref 22–32)
Calcium: 7.4 mg/dL — ABNORMAL LOW (ref 8.9–10.3)
Calcium: 8.1 mg/dL — ABNORMAL LOW (ref 8.9–10.3)
Calcium: 8.7 mg/dL — ABNORMAL LOW (ref 8.9–10.3)
Calcium: 8.9 mg/dL (ref 8.9–10.3)
Calcium: 9 mg/dL (ref 8.9–10.3)
Calcium: 9 mg/dL (ref 8.9–10.3)
Chloride: 112 mmol/L — ABNORMAL HIGH (ref 98–111)
Chloride: 116 mmol/L — ABNORMAL HIGH (ref 98–111)
Chloride: 116 mmol/L — ABNORMAL HIGH (ref 98–111)
Chloride: 117 mmol/L — ABNORMAL HIGH (ref 98–111)
Chloride: 117 mmol/L — ABNORMAL HIGH (ref 98–111)
Chloride: 118 mmol/L — ABNORMAL HIGH (ref 98–111)
Creatinine, Ser: 1.6 mg/dL — ABNORMAL HIGH (ref 0.61–1.24)
Creatinine, Ser: 1.65 mg/dL — ABNORMAL HIGH (ref 0.61–1.24)
Creatinine, Ser: 1.78 mg/dL — ABNORMAL HIGH (ref 0.61–1.24)
Creatinine, Ser: 1.87 mg/dL — ABNORMAL HIGH (ref 0.61–1.24)
Creatinine, Ser: 2.1 mg/dL — ABNORMAL HIGH (ref 0.61–1.24)
Creatinine, Ser: 2.37 mg/dL — ABNORMAL HIGH (ref 0.61–1.24)
GFR, Estimated: 28 mL/min — ABNORMAL LOW (ref >60)
GFR, Estimated: 32 mL/min — ABNORMAL LOW (ref >60)
GFR, Estimated: 37 mL/min — ABNORMAL LOW (ref >60)
GFR, Estimated: 40 mL/min — ABNORMAL LOW (ref >60)
GFR, Estimated: 43 mL/min — ABNORMAL LOW (ref >60)
GFR, Estimated: 45 mL/min — ABNORMAL LOW (ref >60)
Glucose, Bld: 151 mg/dL — ABNORMAL HIGH (ref 70–99)
Glucose, Bld: 158 mg/dL — ABNORMAL HIGH (ref 70–99)
Glucose, Bld: 167 mg/dL — ABNORMAL HIGH (ref 70–99)
Glucose, Bld: 172 mg/dL — ABNORMAL HIGH (ref 70–99)
Glucose, Bld: 253 mg/dL — ABNORMAL HIGH (ref 70–99)
Glucose, Bld: 419 mg/dL — ABNORMAL HIGH (ref 70–99)
Potassium: 3 mmol/L — ABNORMAL LOW (ref 3.5–5.1)
Potassium: 3.1 mmol/L — ABNORMAL LOW (ref 3.5–5.1)
Potassium: 3.5 mmol/L (ref 3.5–5.1)
Potassium: 3.7 mmol/L (ref 3.5–5.1)
Potassium: 3.7 mmol/L (ref 3.5–5.1)
Potassium: 4.5 mmol/L (ref 3.5–5.1)
Sodium: 141 mmol/L (ref 135–145)
Sodium: 144 mmol/L (ref 135–145)
Sodium: 144 mmol/L (ref 135–145)
Sodium: 145 mmol/L (ref 135–145)
Sodium: 145 mmol/L (ref 135–145)
Sodium: 145 mmol/L (ref 135–145)

## 2022-03-22 LAB — GLUCOSE, CAPILLARY
Glucose-Capillary: 146 mg/dL — ABNORMAL HIGH (ref 70–99)
Glucose-Capillary: 172 mg/dL — ABNORMAL HIGH (ref 70–99)
Glucose-Capillary: 161 mg/dL — ABNORMAL HIGH (ref 70–99)
Glucose-Capillary: 176 mg/dL — ABNORMAL HIGH (ref 70–99)
Glucose-Capillary: 143 mg/dL — ABNORMAL HIGH (ref 70–99)

## 2022-03-22 LAB — CBG MONITORING, ED
Glucose-Capillary: 129 mg/dL — ABNORMAL HIGH (ref 70–99)
Glucose-Capillary: 139 mg/dL — ABNORMAL HIGH (ref 70–99)
Glucose-Capillary: 146 mg/dL — ABNORMAL HIGH (ref 70–99)
Glucose-Capillary: 150 mg/dL — ABNORMAL HIGH (ref 70–99)
Glucose-Capillary: 166 mg/dL — ABNORMAL HIGH (ref 70–99)
Glucose-Capillary: 172 mg/dL — ABNORMAL HIGH (ref 70–99)
Glucose-Capillary: 174 mg/dL — ABNORMAL HIGH (ref 70–99)
Glucose-Capillary: 187 mg/dL — ABNORMAL HIGH (ref 70–99)
Glucose-Capillary: 204 mg/dL — ABNORMAL HIGH (ref 70–99)
Glucose-Capillary: 218 mg/dL — ABNORMAL HIGH (ref 70–99)
Glucose-Capillary: 290 mg/dL — ABNORMAL HIGH (ref 70–99)
Glucose-Capillary: 354 mg/dL — ABNORMAL HIGH (ref 70–99)
Glucose-Capillary: 414 mg/dL — ABNORMAL HIGH (ref 70–99)

## 2022-03-22 LAB — MAGNESIUM: Magnesium: 2 mg/dL (ref 1.7–2.4)

## 2022-03-22 LAB — CBC
HCT: 30.5 % — ABNORMAL LOW (ref 39.0–52.0)
Hemoglobin: 11.2 g/dL — ABNORMAL LOW (ref 13.0–17.0)
MCH: 27.5 pg (ref 26.0–34.0)
MCHC: 36.7 g/dL — ABNORMAL HIGH (ref 30.0–36.0)
MCV: 74.8 fL — ABNORMAL LOW (ref 80.0–100.0)
Platelets: 214 10*3/uL (ref 150–400)
RBC: 4.08 MIL/uL — ABNORMAL LOW (ref 4.22–5.81)
RDW: 14.6 % (ref 11.5–15.5)
WBC: 18.6 10*3/uL — ABNORMAL HIGH (ref 4.0–10.5)
nRBC: 0 % (ref 0.0–0.2)

## 2022-03-22 LAB — BETA-HYDROXYBUTYRIC ACID: Beta-Hydroxybutyric Acid: 1.11 mmol/L — ABNORMAL HIGH (ref 0.05–0.27)

## 2022-03-22 LAB — RAPID URINE DRUG SCREEN, HOSP PERFORMED
Amphetamines: NOT DETECTED
Barbiturates: NOT DETECTED
Benzodiazepines: NOT DETECTED
Cocaine: NOT DETECTED
Opiates: NOT DETECTED
Tetrahydrocannabinol: NOT DETECTED

## 2022-03-22 LAB — MRSA NEXT GEN BY PCR, NASAL: MRSA by PCR Next Gen: NOT DETECTED

## 2022-03-22 MED ORDER — CHLORHEXIDINE GLUCONATE CLOTH 2 % EX PADS
6.0000 | MEDICATED_PAD | Freq: Every day | CUTANEOUS | Status: DC
  Administered 2022-03-22: 6 via TOPICAL

## 2022-03-22 MED ORDER — INSULIN ASPART 100 UNIT/ML IJ SOLN: 0.0000 [IU] | Freq: Every day | INTRAMUSCULAR | Status: DC

## 2022-03-22 MED ORDER — POTASSIUM CHLORIDE 10 MEQ/100ML IV SOLN
10.0000 meq | INTRAVENOUS | Status: AC
  Administered 2022-03-22 (×5): 10 meq via INTRAVENOUS
  Filled 2022-03-22 (×5): qty 100

## 2022-03-22 MED ORDER — INSULIN ASPART 100 UNIT/ML IJ SOLN
4.0000 [IU] | Freq: Three times a day (TID) | INTRAMUSCULAR | Status: DC
  Administered 2022-03-23 – 2022-03-25 (×7): 4 [IU] via SUBCUTANEOUS

## 2022-03-22 MED ORDER — INSULIN ASPART 100 UNIT/ML IJ SOLN
0.0000 [IU] | Freq: Three times a day (TID) | INTRAMUSCULAR | Status: DC
  Administered 2022-03-23 (×2): 2 [IU] via SUBCUTANEOUS
  Administered 2022-03-23: 3 [IU] via SUBCUTANEOUS
  Administered 2022-03-24: 2 [IU] via SUBCUTANEOUS
  Administered 2022-03-24: 5 [IU] via SUBCUTANEOUS
  Administered 2022-03-25: 2 [IU] via SUBCUTANEOUS

## 2022-03-22 MED ORDER — INSULIN GLARGINE-YFGN 100 UNIT/ML ~~LOC~~ SOLN
20.0000 [IU] | Freq: Every day | SUBCUTANEOUS | Status: DC
  Administered 2022-03-22 – 2022-03-23 (×2): 20 [IU] via SUBCUTANEOUS
  Filled 2022-03-22 (×4): qty 0.2

## 2022-03-22 NOTE — Progress Notes (Signed)
eLink Physician-Brief Progress Note ?Patient Name: Christopher [name] ?DOB: 25-Jul-1946 ?MRN: [id number] ? ? ?Date of Service ? 03/22/2022  ?HPI/Events of Note ? DKA - AG improving, renal function improving ?Last BMET with K 3.1  ?eICU Interventions ? Replete K ?Trend BMET  ?DKA protocol with insulin gtt  ? ? ? ?Intervention Category ?Intermediate Interventions: Electrolyte abnormality - evaluation and management ? ?Viktorya Arguijo Rodman Pickle ?03/22/2022, 3:47 AM ?

## 2022-03-22 NOTE — Progress Notes (Signed)
6th in range BS at 176.  Insuline demand is 3unit/hour. Patient ready for transition . Discussed with Elink RN  ?

## 2022-03-22 NOTE — Progress Notes (Addendum)
? ?NAME:  Christopher [name], MRN:  [id number], DOB:  07-Mar-1946, LOS: 1 ?ADMISSION DATE:  03/21/2022, CONSULTATION DATE:  03/21/2022   ?REFERRING MD:  Dr. Laverta Baltimore, CHIEF COMPLAINT:  DKA   ? ?History of Present Illness:  ?Christopher [name] is a 76 y.o. male with a PMH significant for type II diabetes, HTN, HLD, and GERD who presented to the ED for complaints of generally feeling unwell.  Per spouse patient has been intermittently confused over the last 2 days with increased urination and vomiting x1 day prior to admission.  Spouse does state she and patient traveled to [place] for 10 days with recent travel back.  Denies any known sick contacts.  Patient manages home diabetes regiment and wife is unsure if patient was compliant with regiment during vacation.  Wife states that glucose on home reading was greater than 300 Tuesday and this was the last reading she was aware of. ? ?On ED arrival patient was seen mildly hypothermic with temperature 97.5, tachypneic, and tachycardic. Lab work indicated severe abnormalities consistent with DKA including pH 6.9, sodium 130, potassium 6.9, chloride 96, CO2 less than 4, glucose 989, creatinine 2.96, WBC 22.6, beta hydroxybutyrate acid greater than 8.  Given severe DKA with pH 6.9 PCCM consulted for further management admission ? ?Pertinent  Medical History  ?type II diabetes, HTN, HLD, and GERD ? ?Significant Hospital Events: ?Including procedures, antibiotic start and stop dates in addition to other pertinent events   ?4/6 admitted with generalized weakness, confusion, tachypnea, and frequent urination found to be in severe DKA with pH 6.9, potassium also 6.9 ? ?Interim History / Subjective:  ?Poorly responsive.  Potassium is currently being repleted ? ?Objective   ?Blood pressure 91/69, pulse 78, temperature (!) 97.5 ?F (36.4 ?C), temperature source Oral, resp. rate 17, height '6\' 2"'$  (1.88 m), weight 86.2 kg, SpO2 100 %. ?   ?   ? ?Intake/Output Summary (Last 24 hours) at 03/22/2022  0807 ?Last data filed at 03/22/2022 0807 ?Gross per 24 hour  ?Intake 144.17 ml  ?Output --  ?Net 144.17 ml  ? ?Filed Weights  ? 03/21/22 1331  ?Weight: 86.2 kg  ? ? ?Examination: ?General: Well-nourished well-developed male ?HEENT: MM pink/moist no JVD or lymphadenopathy is appreciated ?Neuro: Lethargic barely able to communicate ?CV: Heart sounds are regular ?PULM: Diminished in the bases ? ?GI: soft, bsx4 active  ?GU: Amber urine ?Extremities: warm/dry, negative edema  ?Skin: no rashes or lesions ? ? ?Resolved Hospital Problem list   ? ? ?Assessment & Plan:  ?Diabetic ketoacidosis  ?-pH 6.9, CO2 <4, Anion Gap unable to calculate, with altered metal status  metting criteria for Severe DKA  ?-Beta-hydroxybutyric acid >8 ?P: ?DKA protocol ?IV hydration ?Insulin drip ?Replete potassium ?Transferred to intensive care unit ?Monitor pH ? ? ?  ?Acute metabolic encephalopathy  ?P: ?Seizure precautions ?Aspiration precautions ?Correct metabolic disarray ?Consider CT of the head for completeness ? ? ? ?Hx of HTN/HLD ?-Med recc pending ?P: ?Resume home antihypertensives as needed ? ? ? ? ?Best Practice (right click and "Reselect all SmartList Selections" daily)  ? ?Diet/type: NPO ?DVT prophylaxis: prophylactic heparin  ?GI prophylaxis: PPI ?Lines: N/A ?Foley:  N/A ?Code Status:  full code ?Last date of multidisciplinary goals of care discussion: Wife updated at bedside  ? ?Labs   ?CBC: ?Recent Labs  ?Lab 03/21/22 ?1445 03/21/22 ?1507 03/21/22 ?1825 03/22/22 ?0215  ?WBC 22.6*  --  25.9* 18.6*  ?NEUTROABS 19.8*  --   --   --   ?  HGB 14.0 15.6 14.9 11.2*  ?HCT 42.5 46.0 45.4 30.5*  ?MCV 81.1  --  82.2 74.8*  ?PLT 360  --  373 214  ? ? ?Basic Metabolic Panel: ?Recent Labs  ?Lab 03/21/22 ?1445 03/21/22 ?1507 03/21/22 ?1825 03/22/22 ?0029 03/22/22 ?0215  ?NA 130* 128* 134*  134* 141 144  ?K 6.9* 6.9* 6.8*  6.5* 4.5 3.1*  ?CL 95*  --  97*  98 112* 118*  ?CO2 <7*  --  <7*  <7* 11* 14*  ?GLUCOSE 989*  --  907*  916* 419* 253*  ?BUN  52*  --  54*  54* 49* 47*  ?CREATININE 2.98*  --  2.87*  2.90* 2.37* 2.10*  ?CALCIUM 9.1  --  9.6  9.3 8.1* 7.4*  ? ?GFR: ?Estimated Creatinine Clearance: 35.9 mL/min (A) (by C-G formula based on SCr of 2.1 mg/dL (H)). ?Recent Labs  ?Lab 03/21/22 ?1445 03/21/22 ?1825 03/22/22 ?0215  ?WBC 22.6* 25.9* 18.6*  ? ? ?Liver Function Tests: ?No results for input(s): AST, ALT, ALKPHOS, BILITOT, PROT, ALBUMIN in the last 168 hours. ?Recent Labs  ?Lab 03/21/22 ?1825 03/21/22 ?1849  ?LIPASE 20 19  ? ?No results for input(s): AMMONIA in the last 168 hours. ? ?ABG ?   ?Component Value Date/Time  ? HCO3 4.8 (L) 03/21/2022 1507  ? TCO2 5 (L) 03/21/2022 1507  ? ACIDBASEDEF 26.0 (H) 03/21/2022 1507  ? O2SAT 93 03/21/2022 1507  ?  ? ?Coagulation Profile: ?No results for input(s): INR, PROTIME in the last 168 hours. ? ?Cardiac Enzymes: ?No results for input(s): CKTOTAL, CKMB, CKMBINDEX, TROPONINI in the last 168 hours. ? ?HbA1C: ?Hemoglobin A1C  ?Date/Time Value Ref Range Status  ?03/18/2022 03:08 PM 10.8 (A) 4.0 - 5.6 % Final  ?01/24/2022 07:43 AM 9.5 (A) 4.0 - 5.6 % Final  ? ?Hgb A1c MFr Bld  ?Date/Time Value Ref Range Status  ?03/25/2017 09:14 AM 9.2 (H) 4.6 - 6.5 % Final  ?  Comment:  ?  Glycemic Control Guidelines for People with Diabetes:Non Diabetic:  <6%Goal of Therapy: <7%Additional Action Suggested:  >8%   ?01/01/2016 08:57 AM 9.1 (H) 4.6 - 6.5 % Final  ?  Comment:  ?  Glycemic Control Guidelines for People with Diabetes:Non Diabetic:  <6%Goal of Therapy: <7%Additional Action Suggested:  >8%   ? ? ?CBG: ?Recent Labs  ?Lab 03/22/22 ?0321 03/22/22 ?0430 03/22/22 ?3818 03/22/22 ?2993 03/22/22 ?0735  ?GLUCAP 218* 204* 174* 166* 139*  ? ? ? ? ?Critical care time: 22 mins  ?Richardson Landry Minor ACNP ?Acute Care Nurse Practitioner ?[facility] ?Please consult Amion ?03/22/2022, 8:08 AM ? ?I agree with the Advanced Practitioner's note, impression, and recommendations as outlined. I have taken an independent interval  history, reviewed the chart and examined the patient.  My medical decision making is as follows: ? ? ?Subjective: ?Admitted yesterday with DKA suspected secondary to insulin nonadherance. Encephalopathic and was briefly on precedex.  ? ?Objective: ?Vitals:  ? 03/22/22 0940 03/22/22 1030  ?BP: 111/73 131/77  ?Pulse: 76 83  ?Resp: 16 16  ?Temp:    ?SpO2: 99% 100%  ? ? ?Gen:     In restraints, calm, but tremulous ?HEENT:  dry mucus membranes ?Lungs:    sounds of mechanical ventilation auscultated  ?CV:         RRR no mrg ?Abd:      + bowel sounds; soft, non-tender; no palpable masses, no distension ?Ext:    No edema ?Skin:  Warm and dry; no rashes ?Neuro: awake, alert. Follows commands. Not oriented to place or situation. ? ? ?Labs/Imaging: ?BHB downtrending ?K downtrending and has been repleted.  ?pH was 6.9 on admission, received bicarb ? ?Assessment and Plan: ? ?DKA in an IDDM2 ?AKI on CKD Stage 3a ?Hypokalemia ?Dehydration ?Leukocytosis ?Acute metabolic encephalopathy ? ?Transition fluids to LR with D5 per protocol for DKA/endotool. ?No labs since last night. Needs BMET with Mg stat. Discussed with bedside RN ?Still encephalopathic, Head CT negative, likely secondary to profound acidosis from DKA. ?Continue NPO status. ?Will order q4 BMET. ? ?The patient is critically ill due to DKA,. encephalopathy with multiple organ systems failure and requires high complexity decision making for assessment and support, frequent evaluation and titration of therapies, application of advanced monitoring technologies and extensive interpretation of multiple databases.  ? ?Critical Care Time devoted to patient care services described in this note is 35 minutes. This time reflects time of care of this [place] . This critical care time does not reflect separately billable procedures or procedure time, teaching time and supervisory time of PA/NP/Med student/Med Resident etc but could involve care discussion  time. ? ?Spero Geralds ?[facility] ?03/22/2022 10:50 AM  ?Pager: see AMION ? ?If no response to pager, please call critical care on call (see AMION) until 7pm ?After 7:00 pm call Elink   ? ? ? ? ?

## 2022-03-22 NOTE — Progress Notes (Signed)
eLink Physician-Brief Progress Note ?Patient Name: Christopher [name] ?DOB: Jul 17, 1946 ?MRN: [id number] ? ? ?Date of Service ? 03/22/2022  ?HPI/Events of Note ? DKA patient. Gap closed. Transition off insulin gtt. ?Discussed with RN to DC insulin gtt two hours after giving longacting insulin  ?eICU Interventions ? Semglee, mealtime and correction insulin ordered ?Orders to DC insulin gtt at midnight   ? ? ? ?Intervention Category ?Intermediate Interventions: Hyperglycemia - evaluation and treatment ? ?Christopher [name] ?03/22/2022, 9:04 PM ?

## 2022-03-22 NOTE — Progress Notes (Addendum)
Inpatient Diabetes Program Recommendations ? ?AACE/ADA: New Consensus Statement on Inpatient Glycemic Control (2015) ? ?Target Ranges:  Prepandial:   less than 140 mg/dL ?     Peak postprandial:   less than 180 mg/dL (1-2 hours) ?     Critically ill patients:  140 - 180 mg/dL  ? ?Lab Results  ?Component Value Date  ? GLUCAP 172 (H) 03/22/2022  ? HGBA1C 10.8 (A) 03/18/2022  ? ? ?Review of Glycemic Control ? ?Diabetes history: type 1 (confirmed by wife) ?Outpatient Diabetes medications: Relion Novolog 8 units at breakfast and lunch, Victoza 1.2 mg daily, Levemir 56 units daily (on med rec, but not able to confirm with patient; was discontinued by physician Dr. Loanne Drilling on 03/18/22) ?Current orders for Inpatient glycemic control: IV insulin ? ?Inpatient Diabetes Program Recommendations:   ?Spoke with wife at the bedside. Patient unable to communicate at this time. Wife gave me a list of medications. She states that he was diagnosed with diabetes about 8 years. His sister has diabetes. They had been on a 10 day trip to [place] and got back on Sunday. Patient was feeling ill at that time. Wife was not always with him on the trip, so was not aware if patient had taken insulin or not.  Patient has never had extremely high blood sugars. (Or DKA) Dr. Renato Shin is his endocrinologist and had seen him fairly recently. ? ?When patient is able to communicate, insulin and dosages will be confirmed. Patient continues on IV insulin at this time. Waiting for admission to hospital. ? ?ADDENDUM: ?When patient is ready to transition off IV insulin drip, recommend Semglee 20 units daily (to be given 2 hours prior to stopping drip), Novolog SENSITIVE (0-9 units) correction scale TID when eating, Novolog 0-5 units HS scale, and Novolog 4 units TID with meals, only given if eating >50% of meal.  ? ?Harvel Ricks RN BSN CDE ?Diabetes Coordinator ?Pager: [phone]  8am-5pm  ? ? ? ?

## 2022-03-23 ENCOUNTER — Inpatient Hospital Stay (HOSPITAL_COMMUNITY): Payer: Medicare HMO

## 2022-03-23 LAB — BASIC METABOLIC PANEL
Anion gap: 7 (ref 5–15)
Anion gap: 7 (ref 5–15)
BUN: 27 mg/dL — ABNORMAL HIGH (ref 8–23)
BUN: 21 mg/dL (ref 8–23)
CO2: 21 mmol/L — ABNORMAL LOW (ref 22–32)
CO2: 24 mmol/L (ref 22–32)
Calcium: 8.7 mg/dL — ABNORMAL LOW (ref 8.9–10.3)
Calcium: 8.5 mg/dL — ABNORMAL LOW (ref 8.9–10.3)
Chloride: 117 mmol/L — ABNORMAL HIGH (ref 98–111)
Chloride: 112 mmol/L — ABNORMAL HIGH (ref 98–111)
Creatinine, Ser: 1.48 mg/dL — ABNORMAL HIGH (ref 0.61–1.24)
Creatinine, Ser: 1.26 mg/dL — ABNORMAL HIGH (ref 0.61–1.24)
GFR, Estimated: 49 mL/min — ABNORMAL LOW (ref >60)
GFR, Estimated: 60 mL/min — ABNORMAL LOW (ref >60)
Glucose, Bld: 217 mg/dL — ABNORMAL HIGH (ref 70–99)
Glucose, Bld: 223 mg/dL — ABNORMAL HIGH (ref 70–99)
Potassium: 3.3 mmol/L — ABNORMAL LOW (ref 3.5–5.1)
Potassium: 3.2 mmol/L — ABNORMAL LOW (ref 3.5–5.1)
Sodium: 145 mmol/L (ref 135–145)
Sodium: 143 mmol/L (ref 135–145)

## 2022-03-23 LAB — PHOSPHORUS: Phosphorus: 1.3 mg/dL — ABNORMAL LOW (ref 2.5–4.6)

## 2022-03-23 LAB — GLUCOSE, CAPILLARY
Glucose-Capillary: 124 mg/dL — ABNORMAL HIGH (ref 70–99)
Glucose-Capillary: 150 mg/dL — ABNORMAL HIGH (ref 70–99)
Glucose-Capillary: 168 mg/dL — ABNORMAL HIGH (ref 70–99)
Glucose-Capillary: 180 mg/dL — ABNORMAL HIGH (ref 70–99)
Glucose-Capillary: 195 mg/dL — ABNORMAL HIGH (ref 70–99)
Glucose-Capillary: 218 mg/dL — ABNORMAL HIGH (ref 70–99)
Glucose-Capillary: 248 mg/dL — ABNORMAL HIGH (ref 70–99)

## 2022-03-23 LAB — MAGNESIUM: Magnesium: 2 mg/dL (ref 1.7–2.4)

## 2022-03-23 MED ORDER — POTASSIUM CHLORIDE CRYS ER 20 MEQ PO TBCR
40.0000 meq | EXTENDED_RELEASE_TABLET | ORAL | Status: AC
  Administered 2022-03-23 (×2): 40 meq via ORAL
  Filled 2022-03-23 (×2): qty 2

## 2022-03-23 MED ORDER — POTASSIUM CHLORIDE 10 MEQ/100ML IV SOLN: 10.0000 meq | INTRAVENOUS | Status: DC

## 2022-03-23 MED ORDER — DORZOLAMIDE HCL-TIMOLOL MAL 2-0.5 % OP SOLN
1.0000 [drp] | Freq: Two times a day (BID) | OPHTHALMIC | Status: DC
  Administered 2022-03-23 – 2022-03-25 (×5): 1 [drp] via OPHTHALMIC
  Filled 2022-03-23: qty 10

## 2022-03-23 MED ORDER — BRIMONIDINE TARTRATE 0.15 % OP SOLN
1.0000 [drp] | Freq: Every day | OPHTHALMIC | Status: DC
  Administered 2022-03-23 – 2022-03-25 (×3): 1 [drp] via OPHTHALMIC
  Filled 2022-03-23: qty 5

## 2022-03-23 MED ORDER — POTASSIUM CHLORIDE CRYS ER 20 MEQ PO TBCR
40.0000 meq | EXTENDED_RELEASE_TABLET | Freq: Once | ORAL | Status: AC
  Administered 2022-03-23: 40 meq via ORAL
  Filled 2022-03-23: qty 2

## 2022-03-23 NOTE — Progress Notes (Signed)
Inpatient Diabetes Program Recommendations ? ?AACE/ADA: New Consensus Statement on Inpatient Glycemic Control (2015) ? ?Target Ranges:  Prepandial:   less than 140 mg/dL ?     Peak postprandial:   less than 180 mg/dL (1-2 hours) ?     Critically ill patients:  140 - 180 mg/dL  ? ?Lab Results  ?Component Value Date  ? GLUCAP 248 (H) 03/23/2022  ? HGBA1C 10.8 (A) 03/18/2022  ? ? ?Review of Glycemic Control ? Latest Reference Range & Units 03/23/22 07:35  ?Glucose-Capillary 70 - 99 mg/dL 248 (H)  ?(H): Data is abnormally high ? ?Diabetes history: type 1 (confirmed by wife) ?Outpatient Diabetes medications: Relion Novolog 8 units at breakfast and lunch, Victoza 1.2 mg daily, Levemir 56 units daily (on med rec, but not able to confirm with patient; was discontinued by physician Dr. Loanne Drilling on 03/18/22) ?Current orders for Inpatient glycemic control: Semglee 20 units QHS, Novolog 0-9 units TID and 0-5 QHS, Novolog 4 units TID with meals ? ?Inpatient Diabetes Program Recommendations:   ? ?Semglee 25 units QHS ? ?Will continue to follow while inpatient. ? ?Thank you, ?Reche Dixon, MSN, RN ?Diabetes Coordinator ?Inpatient Diabetes Program ?[phone] (team pager from 8a-5p) ? ? ? ?

## 2022-03-23 NOTE — Progress Notes (Deleted)
[facility] ADULT ICU REPLACEMENT PROTOCOL ? ? ?The patient does apply for the [facility] Adult ICU Electrolyte Replacment Protocol based on the criteria listed below:  ? ?1.Exclusion criteria: TCTS patients, ECMO patients, and Dialysis patients ?2. Is GFR >/= 30 ml/min? Yes.    ?Patient's GFR today is 49 ?3. Is SCr </= 2? Yes.   ?Patient's SCr is 1.48 mg/dL ?4. Did SCr increase >/= 0.5 in 24 hours? No. ?5.Pt's weight >40kg  Yes.   ?6. Abnormal electrolyte(s): K 3.3  ?7. Electrolytes replaced per protocol ?8.  Call MD STAT for K+ </= 2.5, Phos </= 1, or Mag </= 1 ?Physician:   ? ?[facility], Quanda Pavlicek A 03/23/2022 5:54 AM ? ?

## 2022-03-23 NOTE — Progress Notes (Signed)
? ?NAME:  Christopher [name], MRN:  [id number], DOB:  [birth year], LOS: 2 ?ADMISSION DATE:  03/21/2022, CONSULTATION DATE:  03/21/2022   ?REFERRING MD:  Dr. Laverta Baltimore, CHIEF COMPLAINT:  DKA   ? ?History of Present Illness:  ?Christopher [name] is a 76 y.o. male with a PMH significant for type II diabetes, HTN, HLD, and GERD who presented to the ED for complaints of generally feeling unwell.  Per spouse patient has been intermittently confused over the last 2 days with increased urination and vomiting x1 day prior to admission.  Spouse does state she and patient traveled to [place] for 10 days with recent travel back.  Denies any known sick contacts.  Patient manages home diabetes regiment and wife is unsure if patient was compliant with regiment during vacation.  Wife states that glucose on home reading was greater than 300 Tuesday and this was the last reading she was aware of. ? ?On ED arrival patient was seen mildly hypothermic with temperature 97.5, tachypneic, and tachycardic. Lab work indicated severe abnormalities consistent with DKA including pH 6.9, sodium 130, potassium 6.9, chloride 96, CO2 less than 4, glucose 989, creatinine 2.96, WBC 22.6, beta hydroxybutyrate acid greater than 8.  Given severe DKA with pH 6.9 PCCM consulted for further management admission ? ?Pertinent  Medical History  ?type II diabetes, HTN, HLD, and GERD ? ?Significant Hospital Events: ?Including procedures, antibiotic start and stop dates in addition to other pertinent events   ?4/6 admitted with generalized weakness, confusion, tachypnea, and frequent urination found to be in severe DKA with pH 6.9, potassium also 6.9 ? ?Interim History / Subjective:  ? ?Alert following commands.  No distress. ? ?Objective   ?Blood pressure (!) 143/96, pulse 83, temperature 98.9 ?F (37.2 ?C), temperature source Oral, resp. rate 15, height '6\' 4"'$  (1.93 m), weight 81.1 kg, SpO2 95 %. ?   ?   ? ?Intake/Output Summary (Last 24 hours) at 03/23/2022 0809 ?Last data filed  at 03/23/2022 7672 ?Gross per 24 hour  ?Intake 3493.46 ml  ?Output 1925 ml  ?Net 1568.46 ml  ? ?Filed Weights  ? 03/21/22 1331 03/22/22 1516 03/23/22 0434  ?Weight: 86.2 kg 81.2 kg 81.1 kg  ? ? ?Examination: ?General appearance: 76 y.o., male, NAD, conversant  ?Eyes: anicteric sclerae, moist conjunctivae; tracking appropriately ?HENT: NCAT; oropharynx normal hard and soft palate ?Neck: Trachea midline; FROM, supple, lymphadenopathy, no JVD ?Lungs: CTAB, no crackles, no wheeze ?CV: RRR, S1, S2, no MRGs  ?Abdomen: Soft, non-tender; non-distended, BS present  ?Skin: Normal temperature, turgor and texture; no rash ?Psych: Appropriate affect ?Neuro: Alert and oriented to person and place, no focal deficit   ? ? ?Resolved Hospital Problem list   ? ? ?Assessment & Plan:  ? ? ?Diabetic ketoacidosis  ?Cytosis secondary to ?-pH 6.9, CO2 <4, Anion Gap unable to calculate, with altered metal status  metting criteria for Severe DKA  ?-Beta-hydroxybutyric acid >8 ?P: ?Continue subcu insulin regimen. ?We will need to titrate this for glucose control. ?Advance diet today ?  ?Acute metabolic encephalopathy, resolved ? ?Hx of HTN/HLD ?-Med recc pending ?P: ?Hold home BP meds for now.. ? ?AKI  ?plan: ?Follow BMP ?Follow ?Likely related to above ?Continue to observe ? ?Best Practice (right click and "Reselect all SmartList Selections" daily)  ? ?Diet/type: NPO ?DVT prophylaxis: prophylactic heparin  ?GI prophylaxis: PPI ?Lines: N/A ?Foley:  N/A ?Code Status:  full code ?Last date of multidisciplinary goals of care discussion: Wife updated at bedside  ? ?Labs   ?  CBC: ?Recent Labs  ?Lab 03/21/22 ?1445 03/21/22 ?1507 03/21/22 ?1825 03/22/22 ?0215  ?WBC 22.6*  --  25.9* 18.6*  ?NEUTROABS 19.8*  --   --   --   ?HGB 14.0 15.6 14.9 11.2*  ?HCT 42.5 46.0 45.4 30.5*  ?MCV 81.1  --  82.2 74.8*  ?PLT 360  --  373 214  ? ? ?Basic Metabolic Panel: ?Recent Labs  ?Lab 03/22/22 ?1125 03/22/22 ?1449 03/22/22 ?1853 03/22/22 ?2225 03/23/22 ?0406  ?NA 145  145 145 144 145  ?K 3.7 3.7 3.5 3.0* 3.3*  ?CL 117* 116* 116* 117* 117*  ?CO2 22 22 21* 21* 21*  ?GLUCOSE 172* 158* 167* 151* 217*  ?BUN 43* 40* 35* 30* 27*  ?CREATININE 1.87* 1.78* 1.65* 1.60* 1.48*  ?CALCIUM 8.7* 9.0 9.0 8.9 8.7*  ?MG 2.0  --   --   --   --   ? ?GFR: ?Estimated Creatinine Clearance: 50.2 mL/min (A) (by C-G formula based on SCr of 1.48 mg/dL (H)). ?Recent Labs  ?Lab 03/21/22 ?1445 03/21/22 ?1825 03/22/22 ?0215  ?WBC 22.6* 25.9* 18.6*  ? ? ?Liver Function Tests: ?No results for input(s): AST, ALT, ALKPHOS, BILITOT, PROT, ALBUMIN in the last 168 hours. ?Recent Labs  ?Lab 03/21/22 ?1825 03/21/22 ?1849  ?LIPASE 20 19  ? ?No results for input(s): AMMONIA in the last 168 hours. ? ?ABG ?   ?Component Value Date/Time  ? HCO3 4.8 (L) 03/21/2022 1507  ? TCO2 5 (L) 03/21/2022 1507  ? ACIDBASEDEF 26.0 (H) 03/21/2022 1507  ? O2SAT 93 03/21/2022 1507  ?  ? ?Coagulation Profile: ?No results for input(s): INR, PROTIME in the last 168 hours. ? ?Cardiac Enzymes: ?No results for input(s): CKTOTAL, CKMB, CKMBINDEX, TROPONINI in the last 168 hours. ? ?HbA1C: ?Hemoglobin A1C  ?Date/Time Value Ref Range Status  ?03/18/2022 03:08 PM 10.8 (A) 4.0 - 5.6 % Final  ?01/24/2022 07:43 AM 9.5 (A) 4.0 - 5.6 % Final  ? ?Hgb A1c MFr Bld  ?Date/Time Value Ref Range Status  ?03/25/2017 09:14 AM 9.2 (H) 4.6 - 6.5 % Final  ?  Comment:  ?  Glycemic Control Guidelines for People with Diabetes:Non Diabetic:  <6%Goal of Therapy: <7%Additional Action Suggested:  >8%   ?01/01/2016 08:57 AM 9.1 (H) 4.6 - 6.5 % Final  ?  Comment:  ?  Glycemic Control Guidelines for People with Diabetes:Non Diabetic:  <6%Goal of Therapy: <7%Additional Action Suggested:  >8%   ? ? ?CBG: ?Recent Labs  ?Lab 03/22/22 ?2028 03/22/22 ?2221 03/23/22 ?0002 03/23/22 ?0402 03/23/22 ?9798  ?GLUCAP 176* 143* 124* 218* 248*  ? ? ?Garner Nash, DO ?[facility] ?03/23/2022 8:09 AM   ? ? ? ? ? ? ? ? ? ?

## 2022-03-23 NOTE — Progress Notes (Signed)
eLink Physician-Brief Progress Note ?Patient Name: Christopher [name] ?DOB: Aug 05, 1946 ?MRN: [id number] ? ? ?Date of Service ? 03/23/2022  ?HPI/Events of Note ? K 3.0  ?eICU Interventions ? PO repletion per protocol  ? ? ? ?Intervention Category ?Intermediate Interventions: Electrolyte abnormality - evaluation and management ? ?Christopher [name] ?03/23/2022, 12:29 AM ?

## 2022-03-24 DIAGNOSIS — E1111 Type 2 diabetes mellitus with ketoacidosis with coma: Secondary | ICD-10-CM | POA: Diagnosis not present

## 2022-03-24 LAB — BASIC METABOLIC PANEL
Anion gap: 7 (ref 5–15)
BUN: 19 mg/dL (ref 8–23)
CO2: 23 mmol/L (ref 22–32)
Calcium: 8.3 mg/dL — ABNORMAL LOW (ref 8.9–10.3)
Chloride: 111 mmol/L (ref 98–111)
Creatinine, Ser: 1.3 mg/dL — ABNORMAL HIGH (ref 0.61–1.24)
GFR, Estimated: 58 mL/min — ABNORMAL LOW (ref >60)
Glucose, Bld: 262 mg/dL — ABNORMAL HIGH (ref 70–99)
Potassium: 3.2 mmol/L — ABNORMAL LOW (ref 3.5–5.1)
Sodium: 141 mmol/L (ref 135–145)

## 2022-03-24 LAB — GLUCOSE, CAPILLARY
Glucose-Capillary: 272 mg/dL — ABNORMAL HIGH (ref 70–99)
Glucose-Capillary: 165 mg/dL — ABNORMAL HIGH (ref 70–99)
Glucose-Capillary: 119 mg/dL — ABNORMAL HIGH (ref 70–99)
Glucose-Capillary: 198 mg/dL — ABNORMAL HIGH (ref 70–99)
Glucose-Capillary: 194 mg/dL — ABNORMAL HIGH (ref 70–99)

## 2022-03-24 LAB — CBC
HCT: 33.5 % — ABNORMAL LOW (ref 39.0–52.0)
Hemoglobin: 11.9 g/dL — ABNORMAL LOW (ref 13.0–17.0)
MCH: 26.6 pg (ref 26.0–34.0)
MCHC: 35.5 g/dL (ref 30.0–36.0)
MCV: 74.9 fL — ABNORMAL LOW (ref 80.0–100.0)
Platelets: 168 10*3/uL (ref 150–400)
RBC: 4.47 MIL/uL (ref 4.22–5.81)
RDW: 14.7 % (ref 11.5–15.5)
WBC: 6.4 10*3/uL (ref 4.0–10.5)
nRBC: 0 % (ref 0.0–0.2)

## 2022-03-24 MED ORDER — LISINOPRIL 5 MG PO TABS
5.0000 mg | ORAL_TABLET | Freq: Every day | ORAL | Status: DC
  Administered 2022-03-24 – 2022-03-25 (×2): 5 mg via ORAL
  Filled 2022-03-24 (×2): qty 1

## 2022-03-24 MED ORDER — INSULIN GLARGINE-YFGN 100 UNIT/ML ~~LOC~~ SOLN
30.0000 [IU] | Freq: Every day | SUBCUTANEOUS | Status: DC
  Administered 2022-03-24: 30 [IU] via SUBCUTANEOUS
  Filled 2022-03-24 (×2): qty 0.3

## 2022-03-24 MED ORDER — INSULIN GLARGINE-YFGN 100 UNIT/ML ~~LOC~~ SOLN
10.0000 [IU] | Freq: Once | SUBCUTANEOUS | Status: AC
  Administered 2022-03-24: 10 [IU] via SUBCUTANEOUS
  Filled 2022-03-24: qty 0.1

## 2022-03-24 MED ORDER — POTASSIUM CHLORIDE CRYS ER 20 MEQ PO TBCR
20.0000 meq | EXTENDED_RELEASE_TABLET | Freq: Two times a day (BID) | ORAL | Status: DC
  Administered 2022-03-24 – 2022-03-25 (×3): 20 meq via ORAL
  Filled 2022-03-24 (×3): qty 1

## 2022-03-24 NOTE — Evaluation (Addendum)
Physical Therapy Evaluation ?Patient Details ?Name: Christopher [name] ?MRN: [id number] ?DOB: 07/18/1946 ?Today's Date: 03/24/2022 ? ?History of Present Illness ? Pt adm 4/6 with weakness and confusion and found to have severe DKA. PMH - DM, HTN, GERD  ?Clinical Impression ? Pt presents to PT with slightly unsteady gait due to illness and inactivity. Expect pt will make good progress back to baseline with mobility. Will follow acutely but doubt pt will need PT after DC. Will return later today to see again to make sure he will be ready to return home when medically ready. ?   ?   ? ?Recommendations for follow up therapy are one component of a multi-disciplinary discharge planning process, led by the attending physician.  Recommendations may be updated based on patient status, additional functional criteria and insurance authorization. ? ?Follow Up Recommendations No PT follow up ? ?  ?Assistance Recommended at Discharge Intermittent Supervision/Assistance  ?Patient can return home with the following ? A little help with walking and/or transfers ? ?  ?Equipment Recommendations None recommended by PT  ?Recommendations for Other Services ?    ?  ?Functional Status Assessment Patient has had a recent decline in their functional status and demonstrates the ability to make significant improvements in function in a reasonable and predictable amount of time.  ? ?  ?Precautions / Restrictions Precautions ?Precautions: Fall  ? ?  ? ?Mobility ? Bed Mobility ?Overal bed mobility: Modified Independent ?  ?  ?  ?  ?  ?  ?  ?  ? ?Transfers ?Overall transfer level: Needs assistance ?Equipment used: None, Straight cane ?Transfers: Sit to/from Stand ?Sit to Stand: Min guard ?  ?  ?  ?  ?  ?General transfer comment: Assist for safety ?  ? ?Ambulation/Gait ?Ambulation/Gait assistance: Min guard, Supervision ?Gait Distance (Feet): 300 Feet ?Assistive device: None, Straight cane ?Gait Pattern/deviations: Step-through pattern, Decreased step  length - right, Decreased step length - left, Shuffle, Trunk flexed ?Gait velocity: decr ?Gait velocity interpretation: 1.31 - 2.62 ft/sec, indicative of limited community ambulator ?  ?General Gait Details: Min guard for safety without assistive device. Improving step length with incr distance. Supervision with cane ? ?Stairs ?  ?  ?  ?  ?  ? ?Wheelchair Mobility ?  ? ?Modified Rankin (Stroke Patients Only) ?  ? ?  ? ?Balance Overall balance assessment: Needs assistance ?Sitting-balance support: No upper extremity supported, Feet supported ?Sitting balance-Leahy Scale: Normal ?  ?  ?Standing balance support: No upper extremity supported, During functional activity ?Standing balance-Leahy Scale: Fair ?  ?  ?  ?  ?  ?  ?  ?  ?  ?  ?  ?  ?   ? ? ? ?Pertinent Vitals/Pain Pain Assessment ?Pain Assessment: No/denies pain  ? ? ?Home Living Family/patient expects to be discharged to:: Private residence ?Living Arrangements: Spouse/significant other ?Available Help at Discharge: Family;Available 24 hours/day ?Type of Home: House ?Home Access: Level entry ?  ?  ?  ?Home Layout: One level ?Home Equipment: Kasandra Knudsen - single point ?   ?  ?Prior Function Prior Level of Function : Independent/Modified Independent;Driving ?  ?  ?  ?  ?  ?  ?Mobility Comments: no assistive device ?  ?  ? ? ?Hand Dominance  ?   ? ?  ?Extremity/Trunk Assessment  ? Upper Extremity Assessment ?Upper Extremity Assessment: Overall WFL for tasks assessed ?  ? ?Lower Extremity Assessment ?Lower Extremity Assessment: Generalized weakness ?  ? ?   ?  Communication  ? Communication: No difficulties  ?Cognition Arousal/Alertness: Awake/alert ?Behavior During Therapy: [facility] for tasks assessed/performed ?Overall Cognitive Status: No family/caregiver present to determine baseline cognitive functioning ?  ?  ?  ?  ?  ?  ?  ?  ?  ?  ?  ?  ?  ?  ?  ?  ?General Comments: Pt slow to process and decr memory ?  ?  ? ?  ?General Comments   ? ?  ?Exercises    ? ?Assessment/Plan   ?  ?PT Assessment Patient needs continued PT services  ?PT Problem List Decreased strength;Decreased balance;Decreased mobility ? ?   ?  ?PT Treatment Interventions DME instruction;Gait training;Functional mobility training;Therapeutic activities;Therapeutic exercise;Balance training;Patient/family education   ? ?PT Goals (Current goals can be found in the Care Plan section)  ?Acute Rehab PT Goals ?Patient Stated Goal: go home ?PT Goal Formulation: With patient ?Time For Goal Achievement: 03/31/22 ?Potential to Achieve Goals: Good ? ?  ?Frequency Min 3X/week ?  ? ? ?Co-evaluation   ?  ?  ?  ?  ? ? ?  ?AM-PAC PT "6 Clicks" Mobility  ?Outcome Measure Help needed turning from your back to your side while in a flat bed without using bedrails?: None ?Help needed moving from lying on your back to sitting on the side of a flat bed without using bedrails?: None ?Help needed moving to and from a bed to a chair (including a wheelchair)?: A Little ?Help needed standing up from a chair using your arms (e.g., wheelchair or bedside chair)?: A Little ?Help needed to walk in hospital room?: A Little ?Help needed climbing 3-5 steps with a railing? : A Little ?6 Click Score: 20 ? ?  ?End of Session   ?Activity Tolerance: Patient tolerated treatment well ?Patient left: in chair;with call bell/phone within reach;with chair alarm set ?Nurse Communication: Mobility status ?PT Visit Diagnosis: Unsteadiness on feet (R26.81);Muscle weakness (generalized) (M62.81) ?  ? ?Time: 6962-9528 ?PT Time Calculation (min) (ACUTE ONLY): 21 min ? ? ?Charges:   PT Evaluation ?$PT Eval Low Complexity: 1 Low ?  ?  ?   ? ? ?[facility] PT ?Acute Rehabilitation Services ?Office [phone] ? ? ?Shary Decamp [facility] ?03/24/2022, 2:00 PM ? ?

## 2022-03-24 NOTE — Progress Notes (Signed)
?PROGRESS NOTE ? ? ? ?Christopher [name]  UYE:[id number] DOB: [birth year] DOA: 03/21/2022 ?PCP: Isaac Bliss, Rayford Halsted, MD ? ? ?Brief Narrative:  ?Christopher [name] is a 76 y.o. male with a PMH significant for type II diabetes, HTN, HLD, and GERD who presented to the ED for complaints of generally feeling unwell. Found to be in DKA and admitted to ICU for close monitoring and care. ? ?Transferred to [facility] 03/24/22 with resolving DKA - acute metabolic encephalopathy ongoing but generally resolving.  Continues to require titration of insulin to maintain euglycemia. ? ? ?Assessment & Plan: ?  ?Principal Problem: ?  DKA (diabetic ketoacidosis) ([place]) ? ? ?Acute metabolic encephalopathy ?Acute anion gap metabolic acidosis secondary to DKA, resolved ?Uncontrolled insulin-dependent diabetes mellitus type 2 with hyperglycemia ?-Lengthy discussion with wife, A1c continues to be elevated over the past year despite following with endocrinology in the outpatient setting ?-Unfortunately despite elevated A1c patient still has profound hypoglycemic events at home, discussed discontinuing short acting insulin and titrating to long-acting Lantus/similar ?-Currently on 30 units at bedtime of Lantus, hopefully this will make hypoglycemic events at home much less likely ?-Continue sliding scale insulin and hypoglycemic protocol while inpatient ?-Mental status improving but not yet back to baseline, likely complication of profound DKA and hyperglycemia intake.  Improving generally. ? ?Hypertension -hold HCTZ, continue lisinopril, titrate accordingly ?Hyperlipidemia -resume statin once p.o. intake improves ?GERD -continue to monitor ? ? ?DVT prophylaxis: Heparin ?Code Status: Full ?Family Communication: Wife updated over the phone ? ?Status is: Inpatient ? ?Dispo: The patient is from: Home ?             Anticipated d/c is to: Home ?             Anticipated d/c date is: 24 to 48 hours pending clinical course ?             Patient currently not  medically stable for discharge ? ?Consultants:  ?PCCM ? ?Procedures:  ?None ? ?Antimicrobials:  ?None indicated ? ?Subjective: ?No acute issues or events overnight denies nausea vomiting diarrhea constipation headache fevers chills or chest pain ? ?Objective: ?Vitals:  ? 03/23/22 2058 03/24/22 0004 03/24/22 0458 03/24/22 0805  ?BP: (!) 140/100 (!) 136/94 (!) 141/94 (!) 143/87  ?Pulse: 88 76 66 70  ?Resp: '18 18 18 18  '$ ?Temp: 99.6 ?F (37.6 ?C) 98.3 ?F (36.8 ?C) 98.6 ?F (37 ?C) 98.3 ?F (36.8 ?C)  ?TempSrc: Oral Oral Oral Oral  ?SpO2: 100% 95% 100% 100%  ?Weight:   83.1 kg   ?Height:      ? ? ?Intake/Output Summary (Last 24 hours) at 03/24/2022 1452 ?Last data filed at 03/24/2022 1317 ?Gross per 24 hour  ?Intake 480 ml  ?Output 2100 ml  ?Net -1620 ml  ? ?Filed Weights  ? 03/22/22 1516 03/23/22 0434 03/24/22 0458  ?Weight: 81.2 kg 81.1 kg 83.1 kg  ? ? ?Examination: ? ?General exam: Appears calm and comfortable  ?Respiratory system: Clear to auscultation. Respiratory effort normal. ?Cardiovascular system: S1 & S2 heard, RRR. No JVD, murmurs, rubs, gallops or clicks. No pedal edema. ?Gastrointestinal system: Abdomen is nondistended, soft and nontender. No organomegaly or masses felt. Normal bowel sounds heard. ?Central nervous system: Alert and oriented. No focal neurological deficits. ?Extremities: Symmetric 5 x 5 power. ?Skin: No rashes, lesions or ulcers ?Psychiatry: Judgement and insight appear normal. Mood & affect appropriate.  ? ? ? ?Data Reviewed: I have personally reviewed following labs and imaging studies ? ?CBC: ?Recent Labs  ?  Lab 03/21/22 ?1445 03/21/22 ?1507 03/21/22 ?1825 03/22/22 ?0215 03/24/22 ?4193  ?WBC 22.6*  --  25.9* 18.6* 6.4  ?NEUTROABS 19.8*  --   --   --   --   ?HGB 14.0 15.6 14.9 11.2* 11.9*  ?HCT 42.5 46.0 45.4 30.5* 33.5*  ?MCV 81.1  --  82.2 74.8* 74.9*  ?PLT 360  --  373 214 168  ? ?Basic Metabolic Panel: ?Recent Labs  ?Lab 03/22/22 ?1125 03/22/22 ?1449 03/22/22 ?1853 03/22/22 ?2225  03/23/22 ?0406 03/23/22 ?1807 03/24/22 ?0318  ?NA 145   < > 145 144 145 143 141  ?K 3.7   < > 3.5 3.0* 3.3* 3.2* 3.2*  ?CL 117*   < > 116* 117* 117* 112* 111  ?CO2 22   < > 21* 21* 21* 24 23  ?GLUCOSE 172*   < > 167* 151* 217* 223* 262*  ?BUN 43*   < > 35* 30* 27* 21 19  ?CREATININE 1.87*   < > 1.65* 1.60* 1.48* 1.26* 1.30*  ?CALCIUM 8.7*   < > 9.0 8.9 8.7* 8.5* 8.3*  ?MG 2.0  --   --   --  2.0  --   --   ?PHOS  --   --   --   --  1.3*  --   --   ? < > = values in this interval not displayed.  ? ?GFR: ?Estimated Creatinine Clearance: 58.6 mL/min (A) (by C-G formula based on SCr of 1.3 mg/dL (H)). ?Liver Function Tests: ?No results for input(s): AST, ALT, ALKPHOS, BILITOT, PROT, ALBUMIN in the last 168 hours. ?Recent Labs  ?Lab 03/21/22 ?1825 03/21/22 ?1849  ?LIPASE 20 19  ? ?No results for input(s): AMMONIA in the last 168 hours. ?Coagulation Profile: ?No results for input(s): INR, PROTIME in the last 168 hours. ?Cardiac Enzymes: ?No results for input(s): CKTOTAL, CKMB, CKMBINDEX, TROPONINI in the last 168 hours. ?BNP (last 3 results) ?No results for input(s): PROBNP in the last 8760 hours. ?HbA1C: ?No results for input(s): HGBA1C in the last 72 hours. ?CBG: ?Recent Labs  ?Lab 03/23/22 ?1123 03/23/22 ?1728 03/23/22 ?2157 03/24/22 ?0802 03/24/22 ?1129  ?GLUCAP 168* 195* 150* 272* 165*  ? ?Lipid Profile: ?No results for input(s): CHOL, HDL, LDLCALC, TRIG, CHOLHDL, LDLDIRECT in the last 72 hours. ?Thyroid Function Tests: ?No results for input(s): TSH, T4TOTAL, FREET4, T3FREE, THYROIDAB in the last 72 hours. ?Anemia Panel: ?No results for input(s): VITAMINB12, FOLATE, FERRITIN, TIBC, IRON, RETICCTPCT in the last 72 hours. ?Sepsis Labs: ?No results for input(s): PROCALCITON, LATICACIDVEN in the last 168 hours. ? ?Recent Results (from the past 240 hour(s))  ?MRSA Next Gen by PCR, Nasal     Status: None  ? Collection Time: 03/22/22  3:23 PM  ? Specimen: Nasal Mucosa; Nasal Swab  ?Result Value Ref Range Status  ? MRSA by  PCR Next Gen NOT DETECTED NOT DETECTED Final  ?  Comment: (NOTE) ?The GeneXpert MRSA Assay (FDA approved for NASAL specimens only), ?is one component of a comprehensive MRSA colonization surveillance ?program. It is not intended to diagnose MRSA infection nor to guide ?or monitor treatment for MRSA infections. ?Test performance is not FDA approved in patients less than 2 years ?old. ?Performed at [facility], [place] [address] [place], [place] ?[address] ?  ?  ? ? ? ? ? ?Radiology Studies: ?DG Chest Port 1 View ? ?Result Date: 03/23/2022 ?CLINICAL DATA:  76 year old male with abnormal respiration EXAM: PORTABLE CHEST 1 VIEW COMPARISON:  Prior chest  x-ray 03/21/2022 FINDINGS: The lungs are clear and negative for focal airspace consolidation, pulmonary edema or suspicious pulmonary nodule. No pleural effusion or pneumothorax. Cardiac and mediastinal contours are within normal limits. No acute fracture or lytic or blastic osseous lesions. The visualized upper abdominal bowel gas pattern is unremarkable. IMPRESSION: No active disease. Electronically Signed   By: Jacqulynn Cadet M.D.   On: 03/23/2022 06:55   ? ? ? ? ? ?Scheduled Meds: ? brimonidine  1 drop Both Eyes Daily  ? Chlorhexidine Gluconate Cloth  6 each Topical Daily  ? dorzolamide-timolol  1 drop Both Eyes BID  ? heparin  5,000 Units Subcutaneous Q8H  ? insulin aspart  0-5 Units Subcutaneous QHS  ? insulin aspart  0-9 Units Subcutaneous TID WC  ? insulin aspart  4 Units Subcutaneous TID WC  ? insulin glargine-yfgn  30 Units Subcutaneous QHS  ? lisinopril  5 mg Oral Daily  ? potassium chloride  20 mEq Oral BID  ? ?Continuous Infusions: ? ? LOS: 3 days  ? ? ?Time spent: 7mn ? ?WLittle Ishikawa DO ?[facility] ? ?If 7PM-7AM, please contact night-coverage ?[email or web address] ? ?03/24/2022, 2:52 PM   ? ? ? ?

## 2022-03-24 NOTE — Progress Notes (Signed)
Inpatient Diabetes Program Recommendations ? ?AACE/ADA: New Consensus Statement on Inpatient Glycemic Control (2015) ? ?Target Ranges:  Prepandial:   less than 140 mg/dL ?     Peak postprandial:   less than 180 mg/dL (1-2 hours) ?     Critically ill patients:  140 - 180 mg/dL  ? ?Lab Results  ?Component Value Date  ? GLUCAP 272 (H) 03/24/2022  ? HGBA1C 10.8 (A) 03/18/2022  ? ? ?Review of Glycemic Control ? Latest Reference Range & Units 03/23/22 07:35 03/23/22 11:23 03/23/22 17:28 03/23/22 21:57 03/24/22 08:02  ?Glucose-Capillary 70 - 99 mg/dL 248 (H) 168 (H) 195 (H) 150 (H) 272 (H)  ?(H): Data is abnormally high ? ?Diabetes history: DM1 ?Outpatient Diabetes medications: Relion Novolog 8 units at breakfast and lunch, Victoza 1.2 mg daily, Levemir 56 units daily (on med rec, but not able to confirm with patient; was discontinued by physician Dr. Loanne Drilling on 03/18/22) ?Current orders for Inpatient glycemic control: Semglee 20 units QHS, Novolog 0-9 units TID, 0-5 units QHS and 4 units TID with meals ? ?Inpatient Diabetes Program Recommendations:   ? ?Fasting CBG's elevated.  PP good with meal coverage.  Please consider: ? ?Semglee 25 units QHS ? ?Will continue to follow while inpatient. ? ?Thank you, ?Reche Dixon, MSN, RN ?Diabetes Coordinator ?Inpatient Diabetes Program ?[phone] (team pager from 8a-5p) ? ? ? ? ? ?

## 2022-03-24 NOTE — Discharge Summary (Signed)
Physician Discharge Summary  ?VOLNEY REIERSON EZM:[id number] DOB: [birth year] DOA: 03/21/2022 ? ?PCP: Isaac Bliss, Rayford Halsted, MD ? ?Admit date: 03/21/2022 ?Discharge date: 03/25/2022 ? ?Admitted From: Home ?Disposition: Home ? ?Recommendations for Outpatient Follow-up:  ?Follow up with PCP in 1-2 weeks ?Follow-up with endocrinology as scheduled ? ?Home Health: PT, nursing ?Equipment/Devices: None ? ?Discharge Condition: Stable ?CODE STATUS: Full ?Diet recommendation: Diabetic diet ? ?Brief/Interim Summary: ?Christopher [name] is a 76 y.o. male with a PMH significant for type II diabetes, HTN, HLD, and GERD who presented to the ED for complaints of generally feeling unwell. Found to be in DKA and admitted to ICU for close monitoring and care. Transferred to [facility] 03/24/22 with resolving DKA - acute metabolic encephalopathy ongoing but generally resolving.  Continues to require titration of insulin to maintain euglycemia. ? ?Patient admitted as above with acute metabolic encephalopathy in the setting of profound DKA and electrolyte metabolic derangements.  Patient has poorly controlled diabetes at baseline given history of elevated A1c with recurrent episodes of hypoglycemia at home per wife at bedside.  Patient was able to wean from insulin drip transition back to long-acting insulin in the ICU and transferred to medical floor given his ongoing metabolic encephalopathy.  At this time patient is back to baseline, improving daily.  Lengthy discussion at bedside with patient and wife about need for insulin regimen changes as below we will discontinue short acting insulin and institute long-acting insulin only given patient's proclivity for acute hypoglycemic events post insulin administration.  We also discussed possible benefits of an insulin pump which I think would greatly improve patient's hyperand hypoglycemic events at home.  Patient this time is otherwise medically stable for discharge, continue home health PT per  recommendations from physical therapy, otherwise stable and agreeable for discharge home ?  ? ?Discharge Diagnoses:  ?Principal Problem: ?  DKA (diabetic ketoacidosis) ([place]) ? ? ? ?Discharge Instructions ? ?Discharge Instructions   ? ? Amb Referral to Nutrition and Diabetic Education   Complete by: As directed ?  ? Poorly controlled DM, limited prior education.  ? Discharge patient   Complete by: As directed ?  ? Discharge disposition: 01-Home or Self Care  ? Discharge patient date: 03/25/2022  ? ?  ? ?Allergies as of 03/25/2022   ?No Known Allergies ?  ? ?  ?Medication List  ?  ? ?STOP taking these medications   ? ?ezetimibe 10 MG tablet ?Commonly known as: ZETIA ?  ?hydrochlorothiazide 25 MG tablet ?Commonly known as: HYDRODIURIL ?  ?insulin aspart 100 UNIT/ML injection ?Commonly known as: novoLOG ?  ?sildenafil 20 MG tablet ?Commonly known as: Revatio ?  ? ?  ? ?TAKE these medications   ? ?Alphagan P 0.1 % Soln ?Generic drug: brimonidine ?Place 1 drop into both eyes daily. ?  ?atorvastatin 80 MG tablet ?Commonly known as: LIPITOR ?Take 1 tablet (80 mg total) by mouth daily. ?  ?B-D SINGLE USE SWABS REGULAR Pads ?USE TO CHECK BLOOD SUGAR 2 TIMES PER DAY ?  ?BD Veo Insulin Syringe U/F 31G X 15/64" 1 ML Misc ?Generic drug: Insulin Syringe-Needle U-100 ?USE  SYRINGE TWICE DAILY ?  ?dorzolamide-timolol 22.3-6.8 MG/ML ophthalmic solution ?Commonly known as: COSOPT ?Place 1 drop into both eyes 2 (two) times daily. ?  ?FreeStyle Libre 14 Day Sensor Misc ?USE 1  EVERY TWO WEEKS ?  ?glucagon 1 MG injection ?Inject 1 mg into the muscle once as needed. ?  ?insulin glargine 100 UNIT/ML Solostar Pen ?Commonly known as:  LANTUS ?Inject 30 Units into the skin at bedtime. ?  ?lisinopril 5 MG tablet ?Commonly known as: ZESTRIL ?Take 1 tablet (5 mg total) by mouth daily. ?Start taking on: March 26, 2022 ?What changed:  ?medication strength ?how much to take ?how to take this ?when to take this ?additional instructions ?  ?multivitamin  tablet ?Take 1 tablet by mouth daily. ?  ?Pen Needles 32G X 4 MM Misc ?1 each by Does not apply route 3 (three) times daily before meals. ?  ?Victoza 18 MG/3ML Sopn ?Generic drug: liraglutide ?Inject 1.2 mg into the skin daily. ?  ? ?  ? ? Follow-up Information   ? ? Care, [facility] Follow up.   ?Specialty: Home Health Services ?Contact information: ?[address] ?[address] ?[place] [phone] ?[phone] ? ? ?  ?  ? ?  ?  ? ?  ? ?No Known Allergies ? ?Consultations: ?PCCM ? ? ?Procedures/Studies: ?CT HEAD WO CONTRAST (5MM) ? ?Result Date: 03/22/2022 ?CLINICAL DATA:  Delirium EXAM: CT HEAD WITHOUT CONTRAST TECHNIQUE: Contiguous axial images were obtained from the base of the skull through the vertex without intravenous contrast. RADIATION DOSE REDUCTION: This exam was performed according to the departmental dose-optimization program which includes automated exposure control, adjustment of the mA and/or kV according to patient size and/or use of iterative reconstruction technique. COMPARISON:  05/27/2018 FINDINGS: Brain: There is no evidence for acute hemorrhage, hydrocephalus, mass lesion, or abnormal extra-axial fluid collection. No definite CT evidence for acute infarction. Chronic tiny lacunar infarct left basal ganglia. Vascular: No hyperdense vessel or unexpected calcification. Skull: No evidence for fracture. No worrisome lytic or sclerotic lesion. Sinuses/Orbits: The visualized paranasal sinuses and mastoid air cells are clear. Visualized portions of the globes and intraorbital fat are unremarkable. Other: None. IMPRESSION: 1. No acute intracranial abnormality. Electronically Signed   By: Misty Stanley M.D.   On: 03/22/2022 09:29  ? ?DG Chest Port 1 View ? ?Result Date: 03/23/2022 ?CLINICAL DATA:  76 year old male with abnormal respiration EXAM: PORTABLE CHEST 1 VIEW COMPARISON:  Prior chest x-ray 03/21/2022 FINDINGS: The lungs are clear and negative for focal airspace consolidation, pulmonary  edema or suspicious pulmonary nodule. No pleural effusion or pneumothorax. Cardiac and mediastinal contours are within normal limits. No acute fracture or lytic or blastic osseous lesions. The visualized upper abdominal bowel gas pattern is unremarkable. IMPRESSION: No active disease. Electronically Signed   By: Jacqulynn Cadet M.D.   On: 03/23/2022 06:55  ? ?DG Chest Portable 1 View ? ?Result Date: 03/21/2022 ?CLINICAL DATA:  A 76 year old male presents for evaluation of DKA and weakness. EXAM: PORTABLE CHEST 1 VIEW COMPARISON:  None FINDINGS: EKG leads project over the chest. Lungs are clear. Cardiomediastinal contours and hilar structures are normal. No signs of pneumothorax. No signs of pleural effusion. On limited assessment there is no acute skeletal process. IMPRESSION: No active disease. Electronically Signed   By: Zetta Bills M.D.   On: 03/21/2022 15:31   ? ? ?Subjective: No events/issues overnight ? ? ?Discharge Exam: ?Vitals:  ? 03/25/22 0510 03/25/22 0746  ?BP: 114/69 119/89  ?Pulse: 67 77  ?Resp: 17 17  ?Temp: 98.2 ?F (36.8 ?C) 98.9 ?F (37.2 ?C)  ?SpO2: 98% 99%  ? ?Vitals:  ? 03/24/22 1524 03/24/22 2043 03/25/22 0510 03/25/22 0746  ?BP: 130/85 137/81 114/69 119/89  ?Pulse: 70 72 67 77  ?Resp: '18 18 17 17  '$ ?Temp: 98 ?F (36.7 ?C) 98.4 ?F (36.9 ?C) 98.2 ?F (36.8 ?C) 98.9 ?  F (37.2 ?C)  ?TempSrc:  Oral Oral Oral  ?SpO2: 100% 100% 98% 99%  ?Weight:      ?Height:      ? ? ?General: Pt is alert, awake, not in acute distress ?Cardiovascular: RRR, S1/S2 +, no rubs, no gallops ?Respiratory: CTA bilaterally, no wheezing, no rhonchi ?Abdominal: Soft, NT, ND, bowel sounds + ?Extremities: no edema, no cyanosis ? ? ? ?The results of significant diagnostics from this hospitalization (including imaging, microbiology, ancillary and laboratory) are listed below for reference.   ? ? ?Microbiology: ?Recent Results (from the past 240 hour(s))  ?MRSA Next Gen by PCR, Nasal     Status: None  ? Collection Time: 03/22/22  3:23  PM  ? Specimen: Nasal Mucosa; Nasal Swab  ?Result Value Ref Range Status  ? MRSA by PCR Next Gen NOT DETECTED NOT DETECTED Final  ?  Comment: (NOTE) ?The GeneXpert MRSA Assay (FDA approved for NASAL specimens on

## 2022-03-24 NOTE — Progress Notes (Signed)
Physical Therapy Treatment ?Patient Details ?Name: Christopher [name] ?MRN: [id number] ?DOB: [birth year] ?Today's Date: 03/24/2022 ? ? ?History of Present Illness Pt adm 4/6 with weakness and confusion and found to have severe DKA. PMH - DM, HTN, GERD ? ?  ?PT Comments  ? ? Returned to see if pt has improved gait after having mobilized earlier today. Amb again and pt continues to have shuffling gait and was more stable with a walker. Pt also continues to be slow to process and decr memory. At this time recommend a rolling walker for home and HHPT. Likely won't need either for long but since he isn't baseline would be helpful.    ?Recommendations for follow up therapy are one component of a multi-disciplinary discharge planning process, led by the attending physician.  Recommendations may be updated based on patient status, additional functional criteria and insurance authorization. ? ?Follow Up Recommendations ? Home health PT ?  ?  ?Assistance Recommended at Discharge Intermittent Supervision/Assistance  ?Patient can return home with the following A little help with walking and/or transfers ?  ?Equipment Recommendations ? Rolling walker (2 wheels)  ?  ?Recommendations for Other Services   ? ? ?  ?Precautions / Restrictions Precautions ?Precautions: Fall  ?  ? ?Mobility ? Bed Mobility ?Overal bed mobility: Modified Independent ?  ?  ?  ?  ?  ?  ?  ?  ? ?Transfers ?Overall transfer level: Needs assistance ?Equipment used: Rolling walker (2 wheels) ?Transfers: Sit to/from Stand ?Sit to Stand: Min guard ?  ?  ?  ?  ?  ?General transfer comment: Incr time to rise and assist for safety ?  ? ?Ambulation/Gait ?Ambulation/Gait assistance: Min guard, Supervision ?Gait Distance (Feet): 250 Feet ?Assistive device: Rolling walker (2 wheels), Straight cane ?Gait Pattern/deviations: Step-through pattern, Decreased step length - right, Decreased step length - left, Shuffle, Trunk flexed ?Gait velocity: decr ?Gait velocity interpretation:  1.31 - 2.62 ft/sec, indicative of limited community ambulator ?  ?General Gait Details: Min guard for safety with cane and supervision with rolling walker ? ? ?Stairs ?  ?  ?  ?  ?  ? ? ?Wheelchair Mobility ?  ? ?Modified Rankin (Stroke Patients Only) ?  ? ? ?  ?Balance Overall balance assessment: Needs assistance ?Sitting-balance support: No upper extremity supported, Feet supported ?Sitting balance-Leahy Scale: Normal ?  ?  ?Standing balance support: No upper extremity supported, During functional activity ?Standing balance-Leahy Scale: Fair ?  ?  ?  ?  ?  ?  ?  ?  ?  ?  ?  ?  ?  ? ?  ?Cognition Arousal/Alertness: Awake/alert ?Behavior During Therapy: [facility] for tasks assessed/performed ?Overall Cognitive Status: No family/caregiver present to determine baseline cognitive functioning ?  ?  ?  ?  ?  ?  ?  ?  ?  ?  ?  ?  ?  ?  ?  ?  ?General Comments: Pt slow to process and decr memory ?  ?  ? ?  ?Exercises   ? ?  ?General Comments   ?  ?  ? ?Pertinent Vitals/Pain Pain Assessment ?Pain Assessment: No/denies pain  ? ? ?Home Living Family/patient expects to be discharged to:: Private residence ?Living Arrangements: Spouse/significant other ?Available Help at Discharge: Family;Available 24 hours/day ?Type of Home: House ?Home Access: Level entry ?  ?  ?  ?Home Layout: One level ?Home Equipment: Kasandra Knudsen - single point ?   ?  ?Prior Function    ?  ?  ?   ? ?  PT Goals (current goals can now be found in the care plan section) Acute Rehab PT Goals ?Patient Stated Goal: go home ?PT Goal Formulation: With patient ?Time For Goal Achievement: 03/31/22 ?Potential to Achieve Goals: Good ?Progress towards PT goals: Progressing toward goals ? ?  ?Frequency ? ? ? Min 3X/week ? ? ? ?  ?PT Plan Discharge plan needs to be updated;Equipment recommendations need to be updated  ? ? ?Co-evaluation   ?  ?  ?  ?  ? ?  ?AM-PAC PT "6 Clicks" Mobility   ?Outcome Measure ? Help needed turning from your back to your side while in a flat bed without  using bedrails?: None ?Help needed moving from lying on your back to sitting on the side of a flat bed without using bedrails?: None ?Help needed moving to and from a bed to a chair (including a wheelchair)?: A Little ?Help needed standing up from a chair using your arms (e.g., wheelchair or bedside chair)?: A Little ?Help needed to walk in hospital room?: A Little ?Help needed climbing 3-5 steps with a railing? : A Little ?6 Click Score: 20 ? ?  ?End of Session   ?Activity Tolerance: Patient tolerated treatment well ?Patient left: in chair;with call bell/phone within reach;with chair alarm set ?Nurse Communication: Mobility status ?PT Visit Diagnosis: Unsteadiness on feet (R26.81);Muscle weakness (generalized) (M62.81) ?  ? ? ?Time: 7124-5809 ?PT Time Calculation (min) (ACUTE ONLY): 14 min ? ?Charges:  $Gait Training: 8-22 mins          ?          ? ?[facility] PT ?Acute Rehabilitation Services ?Office [phone] ? ? ? ?Shary Decamp [facility] ?03/24/2022, 2:05 PM ? ?

## 2022-03-25 LAB — GLUCOSE, CAPILLARY
Glucose-Capillary: 143 mg/dL — ABNORMAL HIGH (ref 70–99)
Glucose-Capillary: 94 mg/dL (ref 70–99)
Glucose-Capillary: 155 mg/dL — ABNORMAL HIGH (ref 70–99)

## 2022-03-25 MED ORDER — INSULIN GLARGINE 100 UNIT/ML SOLOSTAR PEN: 30.0000 [IU] | PEN_INJECTOR | Freq: Every day | SUBCUTANEOUS | 0 refills | Status: DC

## 2022-03-25 MED ORDER — LISINOPRIL 5 MG PO TABS: 5.0000 mg | ORAL_TABLET | Freq: Every day | ORAL | 0 refills | Status: DC

## 2022-03-25 MED ORDER — PEN NEEDLES 32G X 4 MM MISC: 1.0000 | Freq: Three times a day (TID) | 0 refills | Status: AC

## 2022-03-25 MED ORDER — ATORVASTATIN CALCIUM 80 MG PO TABS: 80.0000 mg | ORAL_TABLET | Freq: Every day | ORAL | 1 refills | Status: DC

## 2022-03-25 NOTE — Progress Notes (Signed)
Inpatient Diabetes Program Recommendations ? ?AACE/ADA: New Consensus Statement on Inpatient Glycemic Control (2015) ? ?Target Ranges:  Prepandial:   less than 140 mg/dL ?     Peak postprandial:   less than 180 mg/dL (1-2 hours) ?     Critically ill patients:  140 - 180 mg/dL  ? ?Lab Results  ?Component Value Date  ? GLUCAP 155 (H) 03/25/2022  ? HGBA1C 10.8 (A) 03/18/2022  ? ? ?Review of Glycemic Control ? ?Diabetes history: DM 2 ?Outpatient Diabetes medications: Levemir listed 56 units Daily, Novolog 8 units Breakfast and lunch, Victoza 1.2 mg Daily ?Current orders for Inpatient glycemic control:  ?Semglee 30 units ?Novolog 0-9 units tid ?Novolog 4 units tid meal coverage ? ?Inpatient Diabetes Program Recommendations:   ? ?Spoke with pt and wife at bedside regarding A1c level of 10.8%. Pt reports going to his Endocrinologist in February and did not think any changes were done at this time. Discussed glucose and A1c goals. Pt reports not being able to get one of his insulins at the pharmacy as he has received all he could for now, Pt reports having Novolog and Victoza at home. Discussed back up insulin at [facility] and for them to form a plan with his Endocrinologist in instance that he is unable to get his insulin. I told them to not take any doses of over the counter insulin without the direction of a physician. Pt and wife requesting a new Endocrinologist.  ? ?Thanks, ? ?Tama Headings RN, MSN, BC-ADM ?Inpatient Diabetes Coordinator ?Team Pager [phone] (8a-5p) ? ? ?

## 2022-03-25 NOTE — Progress Notes (Signed)
Discharge instructions given with wife present at bedside. Education for DM management and additional nutritional information included. ?Pt transported off unit via [place] with all belongings on self. He remains awake and stable at baseline on RA ?

## 2022-03-25 NOTE — Care Management Important Message (Signed)
Important Message ? ?Patient Details  ?Name: Christopher [name] ?MRN: [id number] ?Date of Birth: 08-12-1946 ? ? ?Medicare Important Message Given:  Yes ? ?Patient left prior to IM delivery will mail to the patient home address.  ? ? ?Christopher [name] ?03/25/2022, 3:32 PM ?

## 2022-03-25 NOTE — TOC Initial Note (Signed)
Transition of Care (TOC) - Initial/Assessment Note  ? ? ?Patient Details  ?Name: Christopher [name] ?MRN: [id number] ?Date of Birth: 1946-02-27 ? ?Transition of Care (TOC) CM/SW Contact:    ?Christopher Favre, RN ?Phone Number: ?03/25/2022, 11:27 AM ? ?Clinical Narrative:                 ?Spoke to MD in progression regarding consult for new endocrinologist , he feels PCP can assist with same.  ? ?NCM discussed above with patient and Christopher [name]. Christopher [name] asking for a list of endocrinologist with ratings. NCM explained hospital does not have a list of ratings. However they can call number on patient's insurance card and be provided a list of  endocrinologists in network with patient's insurance and then look up ratings. Wife voiced understanding and plans to do same.  ? ?Discussed home health services, both in agreement no preference. Christopher [name] with Christopher [name] accepted referral.  ? ?Patient has walker at home already  ? ?Expected Discharge Plan: [place] ?Barriers to Discharge: No Barriers Identified ? ? ?Patient Goals and CMS Choice ?Patient states their goals for this hospitalization and ongoing recovery are:: to return to home ?CMS Medicare.gov Compare Post Acute Care list provided to:: Patient ?Choice offered to / list presented to : Patient ? ?Expected Discharge Plan and Services ?Expected Discharge Plan: [place] ?  ?Discharge Planning Services: CM Consult ?Post Acute Care Choice: Home Health ?Living arrangements for the past 2 months: [place] ?Expected Discharge Date: 03/25/22               ?DME Arranged: N/A ?  ?  ?  ?  ?HH Arranged: PT, RN ?[place] Agency: [place] ?Date HH Agency Contacted: 03/25/22 ?Time [place]: [phone] ?Representative spoke with at [place]: Christopher [name] ? ?Prior Living Arrangements/Services ?Living arrangements for the past 2 months: [place] ?Lives with:: Spouse ?Patient language and need for interpreter reviewed:: Yes ?Do you feel safe  going back to the place where you live?: Yes      ?Need for Family Participation in Patient Care: Yes (Comment) ?Care giver support system in place?: Yes (comment) ?Current home services: DME ?Criminal Activity/Legal Involvement Pertinent to Current Situation/Hospitalization: No - Comment as needed ? ?Activities of Daily Living ?  ?  ? ?Permission Sought/Granted ?  ?Permission granted to share information with : Yes, Verbal Permission Granted ? Share Information with NAME: wife Christopher [name] [phone] ?   ?   ?   ? ?Emotional Assessment ?Appearance:: Appears stated age ?Attitude/Demeanor/Rapport: Engaged ?Affect (typically observed): Accepting ?Orientation: : Oriented to Self, Oriented to Place, Oriented to  Time, Oriented to Situation ?Alcohol / Substance Use: Not Applicable ?Psych Involvement: No (comment) ? ?Admission diagnosis:  Hyperkalemia [E87.5] ?DKA (diabetic ketoacidosis) ([place]) [E11.10] ?Diabetic ketoacidosis without coma associated with type 2 diabetes mellitus ([place]) [E11.10] ?Patient Active Problem List  ? Diagnosis Date Noted  ? DKA (diabetic ketoacidosis) ([place]) 03/21/2022  ? Routine general medical examination at a health care facility 08/26/2018  ? Encounter for immunization 08/26/2018  ? Type 1 diabetes mellitus with unspecified complications ([place]) 83/41/9622  ? Screening for cardiovascular condition 03/31/2017  ? URINARY FREQUENCY 02/06/2009  ? VENOUS INSUFFICIENCY 05/30/2008  ? Hyperlipidemia 04/15/2008  ? Glaucoma 04/15/2008  ? GERD 04/15/2008  ? ?PCP:  Christopher [name], Christopher Halsted, MD ?Pharmacy:   ?[facility] [place] [address] ?[place]) [place] [address] ?Phone: [phone] Fax: [phone] ? ? ? ? ?Social Determinants of Health (SDOH) Interventions ?  ? ?Readmission Risk Interventions ?   ? View : No data to display.  ?  ?  ?  ? ? ? ?

## 2022-03-25 NOTE — Plan of Care (Signed)
RD consulted for nutrition education regarding diabetes.  ? ?Lab Results  ?Component Value Date  ? HGBA1C 10.8 (A) 03/18/2022  ? ?Pt and wife present for education at bedside. Pt reports that he has received very little prior DM education despite being dx several years back. ? ?RD provided "Carbohydrate Counting for People with Diabetes" handout from the Academy of Nutrition and Dietetics as well as information on the plate method and label reading. Discussed different food groups and their effects on blood sugar, emphasizing carbohydrate-containing foods. Provided list of carbohydrates and recommended serving sizes of common foods. ? ?Discussed importance of controlled and consistent carbohydrate intake throughout the day. Provided examples of ways to balance meals/snacks and encouraged intake of high-fiber, whole grain complex carbohydrates. Teach back method used. ? ?Expect good compliance. ? ?Body mass index is 22.3 kg/m?Marland Kitchen Pt meets criteria for normal based on current BMI. ? ?Placed consult to outpatient DM education center. Pt and wife aware.  ? ?Current diet order is carb modified, patient is consuming approximately 38% of meals at this time. Labs and medications reviewed. No further nutrition interventions warranted at this time. RD contact information provided. If additional nutrition issues arise, please re-consult RD. ? ?Christopher [name], RD, LDN ?Clinical Dietitian ?RD pager # available in [place]  ?After hours/weekend pager # available in [place] ?

## 2022-03-26 ENCOUNTER — Telehealth: Payer: Self-pay

## 2022-03-26 NOTE — Telephone Encounter (Signed)
Transition Care Management Follow-up Telephone Call ?Date of discharge and from where: [facility] 03-25-22 Dx: DKA ?How have you been since you were released from the hospital? Doing good  ?Any questions or concerns? No ? ?Items Reviewed: ?Did the pt receive and understand the discharge instructions provided? Yes  ?Medications obtained and verified? Yes  ?Other? No  ?Any new allergies since your discharge? No  ?Dietary orders reviewed? Yes ?Do you have support at home? Yes  ? ?Home Care and Equipment/Supplies: ?Were home health services ordered? Yes RN/ PT  ?If so, what is the name of the agency? [facility]   ?Has the agency set up a time to come to the patient's home? no ?Were any new equipment or medical supplies ordered?  No ?What is the name of the medical supply agency? na ?Were you able to get the supplies/equipment? not applicable ?Do you have any questions related to the use of the equipment or supplies? No ? ?Functional Questionnaire: (I = Independent and D = Dependent) ?ADLs: I ? ?Bathing/Dressing- I ? ?Meal Prep- I ? ?Eating- I ? ?Maintaining continence- I ? ?Transferring/Ambulation- I ? ?Managing Meds- D ? ?Follow up appointments reviewed: ? ?PCP Hospital f/u appt confirmed? Yes  Scheduled to see Dr Deniece Ree on 04-03-22 @ 9am. ?[facility] f/u appt confirmed? No  . ?Are transportation arrangements needed? No  ?If their condition worsens, is the pt aware to call PCP or go to the Emergency Dept.? Yes ?Was the patient provided with contact information for the PCP's office or ED? Yes ?Was to pt encouraged to call back with questions or concerns? Yes  ?

## 2022-03-27 ENCOUNTER — Telehealth: Payer: Medicare HMO | Admitting: Internal Medicine

## 2022-03-27 ENCOUNTER — Telehealth: Payer: Self-pay | Admitting: *Deleted

## 2022-03-27 ENCOUNTER — Telehealth: Payer: Self-pay

## 2022-03-27 ENCOUNTER — Other Ambulatory Visit: Payer: Self-pay | Admitting: Internal Medicine

## 2022-03-27 MED ORDER — INSULIN GLARGINE 100 UNIT/ML SOLOSTAR PEN: 40.0000 [IU] | PEN_INJECTOR | Freq: Every day | SUBCUTANEOUS | 0 refills | Status: DC

## 2022-03-27 NOTE — Telephone Encounter (Signed)
Patient's spouse now informed to increase lantus to 40 units and to avoid high sugared drinks, expressed understanding. Also that we will see him at f/u appt on 4/17. ?

## 2022-03-27 NOTE — Telephone Encounter (Signed)
Incoming call from patient's wife regarding his high sugars. Patient recently discharged from the hospital due to DKA complications. Since discharged the patient hasn't had his Freestyle Libre CGM on and recently applied one today. Latest readings have been 445 at 2:17 p.m. and 412 at 3:09 p.m. Patient's wife confirmed that he is taking 30 units of Lantus around 7:00 p.m as well as 1.2 mg of Victoza daily around 7:00 p.m. Please advise ? ?

## 2022-03-27 NOTE — Telephone Encounter (Signed)
Teams message received from Christopher Regis Bill as below: ?[12:50 PM] [name], Christopher Laster ? ? ?Important! ? ?  Christopher [name] was place on schedule for 2 pm for   blood sugar .    record review  shows   He is under care from Christopher [name] endocrinology and just got out of hospital for DKA  april 10   Have endocrinology team  address his  blood sugar issues  ( if there is another problem then I can help with that)   Please contact patient and find out what is  going on and  reach out to endocrine to get him help from them as to not waste time.   His blood sugar management advice  should come from ENDOcrine. thanks ? ? ?I called the patient and informed him of the message above.  I called Christopher [name] office, spoke with Christopher [name] and she stated the only opening with their office is on 4/17.  Patient agreed to the appt and she scheduled the patient an appt  on 4/17 with Christopher [name] and he is aware to arrive at 1:30pm for check in.  Patient's wife questioned what to do regarding elevated blood sugar and I asked her if this advice was not given by the discharge physician.  She stated she is not aware of this info being given and was advised to contact Christopher [name] office for advice.  She also wanted to ask if a different medication could be given for the patient's BP as she read that Lisinopril is not good to take for African Americans?  Message sent to PCP and the patient's wife is aware PCP will address when she returns to the office on 4/17. ?

## 2022-04-01 ENCOUNTER — Other Ambulatory Visit: Payer: Self-pay | Admitting: Endocrinology

## 2022-04-01 ENCOUNTER — Ambulatory Visit (INDEPENDENT_AMBULATORY_CARE_PROVIDER_SITE_OTHER): Payer: Medicare HMO | Admitting: Endocrinology

## 2022-04-01 ENCOUNTER — Encounter: Payer: Self-pay | Admitting: Endocrinology

## 2022-04-01 VITALS — BP 126/88 | HR 81 | Ht 76.0 in | Wt 186.2 lb

## 2022-04-01 DIAGNOSIS — E108 Type 1 diabetes mellitus with unspecified complications: Secondary | ICD-10-CM

## 2022-04-01 DIAGNOSIS — E1065 Type 1 diabetes mellitus with hyperglycemia: Secondary | ICD-10-CM

## 2022-04-01 MED ORDER — INSULIN GLARGINE 100 UNIT/ML SOLOSTAR PEN: 40.0000 [IU] | PEN_INJECTOR | SUBCUTANEOUS | 1 refills | Status: DC

## 2022-04-01 MED ORDER — FREESTYLE LIBRE 2 SENSOR MISC: 1.0000 | 3 refills | Status: DC

## 2022-04-01 NOTE — Progress Notes (Signed)
? ?Subjective:  ? ? Patient ID: Christopher [name], male    DOB: 09/14/1946, 76 y.o.   MRN: [id number] ? ?HPI ?Pt returns for f/u of diabetes mellitus:  ?DM type: 1   ?Dx'ed: 2010.   ?Complications: PN and CRI.   ?Therapy: insulin since 2013.   ?DKA: once (2023) ?Severe hypoglycemia: last episode was in 2018.   ?Pancreatitis: never.  ?SDOH: he is back on QD insulin, after noncompliance with multiple daily injections; ins declined Trulicity. He is retired.   ?Other: he could not take lantus, due to AM hypoglycemia and PM hyperglycemia; however, he changed back after poor results with faster-acting qd insulin; He declines V-GO.  He eats meals at 7AM, 1PM, and 6PM.   ?Interval history: he was recently in the hospital with DKA.  I reviewed continuous glucose monitor data.  Glucose varies from 40-400.  It is in general highest at [facility].  It is lowest at 7AM.  It decreases overnight.  it varies widely at all times of day.   ?Past Medical History:  ?Diagnosis Date  ? Diabetes mellitus type II   ? GERD (gastroesophageal reflux disease)   ? Glaucoma   ? Hyperlipidemia   ? Hypertension   ? ? ?Past Surgical History:  ?Procedure Laterality Date  ? COLONOSCOPY    ? HERNIA REPAIR    ? [place]  ? ? ?Social History  ? ?Socioeconomic History  ? Marital status: Married  ?  Spouse name: Not on file  ? Number of children: Not on file  ? Years of education: Not on file  ? Highest education level: Not on file  ?Occupational History  ? Not on file  ?Tobacco Use  ? Smoking status: Never  ? Smokeless tobacco: Never  ?Vaping Use  ? Vaping Use: Never used  ?Substance and Sexual Activity  ? Alcohol use: No  ? Drug use: No  ? Sexual activity: Not on file  ?Other Topics Concern  ? Not on file  ?Social History Narrative  ? Not on file  ? ?Social Determinants of Health  ? ?Financial Resource Strain: Not on file  ?Food Insecurity: Not on file  ?Transportation Needs: Not on file  ?Physical Activity: Not on file  ?Stress: Not on file   ?Social Connections: Not on file  ?Intimate Partner Violence: Not on file  ? ? ?Current Outpatient Medications on File Prior to Visit  ?Medication Sig Dispense Refill  ? Alcohol Swabs (B-D SINGLE USE SWABS REGULAR) PADS USE TO CHECK BLOOD SUGAR 2 TIMES PER DAY 200 each prn  ? ALPHAGAN P 0.1 % SOLN Place 1 drop into both eyes daily.     ? atorvastatin (LIPITOR) 80 MG tablet Take 1 tablet (80 mg total) by mouth daily. 90 tablet 1  ? dorzolamide-timolol (COSOPT) 22.3-6.8 MG/ML ophthalmic solution Place 1 drop into both eyes 2 (two) times daily.     ? glucagon 1 MG injection Inject 1 mg into the muscle once as needed. 1 each 12  ? Insulin Pen Needle (PEN NEEDLES) 32G X 4 MM MISC 1 each by Does not apply route 3 (three) times daily before meals. 200 each 0  ? Insulin Syringe-Needle U-100 (BD VEO INSULIN SYRINGE U/F) 31G X 15/64" 1 ML MISC USE  SYRINGE TWICE DAILY 200 each 5  ? lisinopril (ZESTRIL) 5 MG tablet Take 1 tablet (5 mg total) by mouth daily. 30 tablet 0  ? Multiple Vitamin (MULTIVITAMIN) tablet Take 1 tablet by mouth  daily.    ? ?No current facility-administered medications on file prior to visit.  ? ? ?No Known Allergies ? ?Family History  ?Problem Relation Age of Onset  ? Alcohol abuse Father   ? Diabetes Sister   ? Colon cancer Neg Hx   ? Esophageal cancer Neg Hx   ? Stomach cancer Neg Hx   ? Rectal cancer Neg Hx   ? ? ?BP 126/88 (BP Location: Left Arm, Patient Position: Sitting, Cuff Size: Normal)   Pulse 81   Ht '6\' 4"'$  (1.93 m)   Wt 186 lb 3.2 oz (84.5 kg)   SpO2 98%   BMI 22.66 kg/m?  ? ? ?Review of Systems ? ?   ?Objective:  ? Physical Exam ?VITAL SIGNS:  See vs page ?GENERAL: no distress.   ? ? ?Lab Results  ?Component Value Date  ? HGBA1C 10.8 (A) 03/18/2022  ? ? ?   ?Assessment & Plan:  ?Type 1 DM: uncontrolled ? ? ?Patient Instructions  ?check your blood sugar twice a day.  vary the time of day when you check, between before the 3 meals, and at bedtime.  also check if you have symptoms of your  blood sugar being too high or too low.  please keep a record of the readings and bring it to your next appointment here (or you can bring the meter itself).  You can write it on any piece of paper.  please call us sooner if your blood sugar goes below 70, or if you have a lot of readings over 200.    ?Please stop taking the Victoza, and change the Lantus to the morning.   ?On this type of insulin schedule, you should eat meals on a regular schedule.  In particular, you should eat lunch by noon.  If a meal is missed or significantly delayed, your blood sugar could go low.    ?You should have an endocrinology follow-up appointment in 2 months.    ? ? ?

## 2022-04-01 NOTE — Patient Instructions (Addendum)
check your blood sugar twice a day.  vary the time of day when you check, between before the 3 meals, and at bedtime.  also check if you have symptoms of your blood sugar being too high or too low.  please keep a record of the readings and bring it to your next appointment here (or you can bring the meter itself).  You can write it on any piece of paper.  please call us sooner if your blood sugar goes below 70, or if you have a lot of readings over 200.    ?Please stop taking the Victoza, and change the Lantus to the morning.   ?On this type of insulin schedule, you should eat meals on a regular schedule.  In particular, you should eat lunch by noon.  If a meal is missed or significantly delayed, your blood sugar could go low.    ?You should have an endocrinology follow-up appointment in 2 months.    ?

## 2022-04-01 NOTE — Telephone Encounter (Signed)
Patient informed of the message below.

## 2022-04-03 ENCOUNTER — Encounter: Payer: Self-pay | Admitting: Internal Medicine

## 2022-04-03 ENCOUNTER — Ambulatory Visit (INDEPENDENT_AMBULATORY_CARE_PROVIDER_SITE_OTHER): Payer: Medicare HMO | Admitting: Internal Medicine

## 2022-04-03 VITALS — BP 110/80 | HR 69 | Temp 97.7°F | Wt 188.1 lb

## 2022-04-03 DIAGNOSIS — Z09 Encounter for follow-up examination after completed treatment for conditions other than malignant neoplasm: Secondary | ICD-10-CM | POA: Diagnosis not present

## 2022-04-03 DIAGNOSIS — E108 Type 1 diabetes mellitus with unspecified complications: Secondary | ICD-10-CM

## 2022-04-03 DIAGNOSIS — E1111 Type 2 diabetes mellitus with ketoacidosis with coma: Secondary | ICD-10-CM

## 2022-04-03 MED ORDER — LISINOPRIL 5 MG PO TABS: 5.0000 mg | ORAL_TABLET | Freq: Every day | ORAL | 1 refills | Status: DC

## 2022-04-03 MED ORDER — FREESTYLE LIBRE 2 SENSOR MISC: 1.0000 | 3 refills | Status: DC

## 2022-04-03 NOTE — Progress Notes (Addendum)
Hospital follow-up visit     CC/Reason for Visit: Hospitalization follow-up  HPI: Christopher [name] is a 76 y.o. male who is coming in today for the above mentioned reasons, specifically transitional care services face-to-face visit.    Dates hospitalized: 03/21/2022-03/25/2022 Days since discharge from hospital: 9 Patient was discharged from the hospital to: Home Reason for admission to hospital: Diabetic ketoacidosis/metabolic encephalopathy Date of interactive phone contact with patient and/or caregiver: 03/26/2022  I have reviewed in detail patient's discharge summary plus pertinent specific notes, labs, and images from the hospitalization.  Yes  He was admitted to the hospital on 03/21/2022 after his wife noticed him unwell, confused and with deep breathing.  He was found to be in DKA and admitted to the ICU on a DKA protocol.  With correction of his blood glucoses encephalopathy rapidly improved.  He was discharged home on 4/10.  He has already followed up with endocrinology.  He has been taken off Victoza, he is currently on Lantus 40 units in the morning.  He does not have a continuous glucose monitor.  He states he is feeling back to baseline, wife still notices some mental fogginess and confusion.  Medication reconciliation was done today and patient is taking meds as recommended by discharging hospitalist/specialist.  Yes   Past Medical/Surgical History: Past Medical History:  Diagnosis Date   Diabetes mellitus type II    GERD (gastroesophageal reflux disease)    Glaucoma    Hyperlipidemia    Hypertension     Past Surgical History:  Procedure Laterality Date   COLONOSCOPY     HERNIA REPAIR     PATELLA FRACTURE SURGERY  1993    Social History:  reports that he has never smoked. He has never used smokeless tobacco. He reports that he does not drink alcohol and does not use drugs.  Allergies: No Known Allergies  Family History:  Family History  Problem Relation  Age of Onset   Alcohol abuse Father    Diabetes Sister    Colon cancer Neg Hx    Esophageal cancer Neg Hx    Stomach cancer Neg Hx    Rectal cancer Neg Hx      Current Outpatient Medications:    Alcohol Swabs (B-D SINGLE USE SWABS REGULAR) PADS, USE TO CHECK BLOOD SUGAR 2 TIMES PER DAY, Disp: 200 each, Rfl: prn   ALPHAGAN P 0.1 % SOLN, Place 1 drop into both eyes daily. , Disp: , Rfl:    atorvastatin (LIPITOR) 80 MG tablet, Take 1 tablet (80 mg total) by mouth daily., Disp: 90 tablet, Rfl: 1   Continuous Blood Gluc Sensor (FREESTYLE LIBRE 2 SENSOR) MISC, 1 Device by Does not apply route every 14 (fourteen) days., Disp: 6 each, Rfl: 3   dorzolamide-timolol (COSOPT) 22.3-6.8 MG/ML ophthalmic solution, Place 1 drop into both eyes 2 (two) times daily. , Disp: , Rfl:    glucagon 1 MG injection, Inject 1 mg into the muscle once as needed., Disp: 1 each, Rfl: 12   insulin glargine (LANTUS) 100 UNIT/ML Solostar Pen, Inject 40 Units into the skin every morning., Disp: 45 mL, Rfl: 1   Insulin Pen Needle (PEN NEEDLES) 32G X 4 MM MISC, 1 each by Does not apply route 3 (three) times daily before meals., Disp: 200 each, Rfl: 0   Insulin Syringe-Needle U-100 (BD VEO INSULIN SYRINGE U/F) 31G X 15/64" 1 ML MISC, USE  SYRINGE TWICE DAILY, Disp: 200 each, Rfl: 5   Multiple Vitamin (  MULTIVITAMIN) tablet, Take 1 tablet by mouth daily., Disp: , Rfl:    lisinopril (ZESTRIL) 5 MG tablet, Take 1 tablet (5 mg total) by mouth daily., Disp: 90 tablet, Rfl: 1  Review of Systems:  Constitutional: Denies fever, chills, diaphoresis, appetite change and fatigue.  HEENT: Denies photophobia, eye pain, redness, hearing loss, ear pain, congestion, sore throat, rhinorrhea, sneezing, mouth sores, trouble swallowing, neck pain, neck stiffness and tinnitus.   Respiratory: Denies SOB, DOE, cough, chest tightness,  and wheezing.   Cardiovascular: Denies chest pain, palpitations and leg swelling.  Gastrointestinal: Denies nausea,  vomiting, abdominal pain, diarrhea, constipation, blood in stool and abdominal distention.  Genitourinary: Denies dysuria, urgency, frequency, hematuria, flank pain and difficulty urinating.  Endocrine: Denies: hot or cold intolerance, sweats, changes in hair or nails, polyuria, polydipsia. Musculoskeletal: Denies myalgias, back pain, joint swelling, arthralgias and gait problem.  Skin: Denies pallor, rash and wound.  Neurological: Denies dizziness, seizures, syncope, weakness, light-headedness, numbness and headaches.  Hematological: Denies adenopathy. Easy bruising, personal or family bleeding history  Psychiatric/Behavioral: Denies suicidal ideation, mood changes, confusion, nervousness, sleep disturbance and agitation    Physical Exam: Vitals:   04/03/22 0908  BP: 110/80  Pulse: 69  Temp: 97.7 F (36.5 C)  TempSrc: Oral  SpO2: 98%  Weight: 188 lb 1.6 oz (85.3 kg)    Body mass index is 22.9 kg/m.   Constitutional: NAD, calm, comfortable Eyes: PERRL, lids and conjunctivae normal ENMT: Mucous membranes are moist.  Respiratory: clear to auscultation bilaterally, no wheezing, no crackles. Normal respiratory effort. No accessory muscle use.  Cardiovascular: Regular rate and rhythm, no murmurs / rubs / gallops. No extremity edema.  Psychiatric: Normal judgment and insight. Alert and oriented x 3. Normal mood.    Impression and Plan:  Hospital discharge follow-up  Diabetic ketoacidosis with coma associated with type 2 diabetes mellitus ([place])  - Plan: lisinopril (ZESTRIL) 5 MG tablet, Amb ref to [facility] charts have been reviewed in detail.  I have done a lot of counseling to patient and wife regarding nutrition, importance of continuous glucose monitoring.  I suspect he will need short acting insulin at some point.  His wife states that there is huge variability in his glucose from 40 all the way up to 400.  I will prescribe a freestyle libre for  him, I will also refer him to dietitian for further education.  He is being followed by endocrinology as well.    Medical decision making of moderate complexity was utilized today.     Lelon Frohlich, MD [place] [facility]

## 2022-04-04 ENCOUNTER — Telehealth: Payer: Self-pay | Admitting: Endocrinology

## 2022-04-04 ENCOUNTER — Other Ambulatory Visit: Payer: Self-pay | Admitting: Endocrinology

## 2022-04-04 MED ORDER — NOVOLIN N FLEXPEN 100 UNIT/ML ~~LOC~~ SUPN: 40.0000 [IU] | PEN_INJECTOR | SUBCUTANEOUS | 1 refills | Status: DC

## 2022-04-04 NOTE — Telephone Encounter (Signed)
Patient's wife Onalee Hua requests to be called at ph# [phone] re: Patient's blood sugars are currently 38 and [name] states Dr. Loanne Drilling told her to contact him if Patient's blood sugars started rising again. [name] requests to be called asap due to last time she left a message on voice mail she did not receive a return call. ?

## 2022-04-04 NOTE — Telephone Encounter (Signed)
I have sent a prescription to your pharmacy, to change to NPH, 40 units each morning.  Please call or message [place] next week, to tell us how the blood sugar is doing.  Informed patient wife. ?

## 2022-04-09 ENCOUNTER — Telehealth: Payer: Self-pay | Admitting: Endocrinology

## 2022-04-09 ENCOUNTER — Telehealth: Payer: Self-pay

## 2022-04-09 NOTE — Telephone Encounter (Signed)
Patient called the Access Nurse on 4/24 and reported of having high blood sugars >400 the entire day. Called and spoke with the patient to see how his sugars have been since yesterday and says that they are still running above 400 particularly in the afternoon. Confirmed that the patient is taking 40 units of Novolin every morning. Also asked the patient if he is able to provide any blood sugar readings and he says he isnt at this time. Please advise. ?

## 2022-04-09 NOTE — Telephone Encounter (Signed)
Christopher [name] - Pharmacist with [facility] ph# [phone] called re: Christopher [name] states he has left several messages and sent faxes beginning in April 2023 with no response re: Christopher [name] states message is as follows: Patient is diabetic and should be on a statin per guidelines ?

## 2022-04-10 ENCOUNTER — Other Ambulatory Visit (HOSPITAL_COMMUNITY): Payer: Self-pay

## 2022-04-10 ENCOUNTER — Telehealth: Payer: Self-pay | Admitting: Internal Medicine

## 2022-04-10 MED ORDER — FREESTYLE LIBRE 2 READER DEVI: 1.0000 | Freq: Every day | 2 refills | Status: DC

## 2022-04-10 NOTE — Telephone Encounter (Signed)
Pt was given the freestyle libre sensors by the provider but no meter to read the sensors and is seeking advice.   ?[facility] ([address]), [place] [address] Phone:  [phone]  ?Fax:  [phone]  ?  ? ?

## 2022-04-10 NOTE — Telephone Encounter (Signed)
Left message on secure voicemail for [place] with verbal orders for skilled nursing. ?

## 2022-04-10 NOTE — Telephone Encounter (Signed)
Reader sent. ?Engineer, materials for [employer] would like verbal orders for skilled nursing 1 x 4 weeks. ?Okay for verbal orders? ?

## 2022-04-11 ENCOUNTER — Encounter: Payer: Medicare HMO | Attending: Internal Medicine | Admitting: Dietician

## 2022-04-11 ENCOUNTER — Other Ambulatory Visit: Payer: Self-pay | Admitting: Endocrinology

## 2022-04-11 DIAGNOSIS — Z713 Dietary counseling and surveillance: Secondary | ICD-10-CM | POA: Diagnosis not present

## 2022-04-11 DIAGNOSIS — E108 Type 1 diabetes mellitus with unspecified complications: Secondary | ICD-10-CM | POA: Insufficient documentation

## 2022-04-11 MED ORDER — ONETOUCH VERIO VI STRP: 1.0000 | ORAL_STRIP | Freq: Two times a day (BID) | 3 refills | Status: DC

## 2022-04-11 NOTE — Patient Instructions (Signed)
Take 10 units Novolin N when you get home today. ?Increase Novolin N to 55 units q am and call MD for and concerns. ? ? ?

## 2022-04-11 NOTE — Progress Notes (Signed)
Patient is here today with his wife as a walk in. ?He recently was prescribed a Free [employer] 2 as the [place] 14 day is being discontinued.  He tried to use the [place] 2 with the reader for the 14 day which did not work.  He has an iPhone 5 which is not compatible with the [place] 2.  Noted that a prescription for the [facility] 2 Reader was placed yesterday. ? ?He does not have a meter at home. ?Provided a One Touch Verio Flex Lot [id number] x, Expiration 05/15/2026 with strips Lot [id number], expiration 05/14/2022.   ?Patient was able to demonstrate use. ?Blood glucose was 567.   ?Patient is ambulatory, no nausea or vomiting and showing no other signs or symptoms.   ?He reports taking his Novolin N 45 units this am. ?Breakfast:  cheerios with raisins and unsweetened almond milk and chicken salad. ? ?Spoke with Dr. Loanne Drilling.  He is to take 10 more units of  the Novoloin N when he gets home today and increase his am dose to 55 units q day starting tomorrow. ?Instructed patient and wife.  Discussed Novolin N action time and duration.  Patient was changed to Novolin N from Lantus due to low blood sugar at night.   ?Patient has an appointment with MD 04/19/22 and is to call for any concerns.   ? ?Provided Insulin handout and reviewed insulin use, injection, site rotation, and storage. ?Instructed patient and wife on use of glucagon with demonstration. ?Instructed to call MD if glucose remains high and develops N/V. ?Provided 16 ounces of water. ? ?Patient has my card as well as appointment with another diabetes educator on 04/30/22. ? ?Antonieta Iba, RD, LDN, CDCES ? ? ? ? ? ? ?

## 2022-04-13 ENCOUNTER — Encounter (HOSPITAL_COMMUNITY): Payer: Self-pay | Admitting: Emergency Medicine

## 2022-04-13 ENCOUNTER — Emergency Department (HOSPITAL_COMMUNITY)
Admission: EM | Admit: 2022-04-13 | Discharge: 2022-04-13 | Disposition: A | Discharge status: Home / Self Care | Payer: Medicare HMO | Attending: Emergency Medicine | Admitting: Emergency Medicine

## 2022-04-13 ENCOUNTER — Other Ambulatory Visit: Payer: Self-pay

## 2022-04-13 DIAGNOSIS — Z794 Long term (current) use of insulin: Secondary | ICD-10-CM | POA: Insufficient documentation

## 2022-04-13 DIAGNOSIS — R739 Hyperglycemia, unspecified: Secondary | ICD-10-CM

## 2022-04-13 DIAGNOSIS — E1110 Type 2 diabetes mellitus with ketoacidosis without coma: Secondary | ICD-10-CM | POA: Diagnosis not present

## 2022-04-13 DIAGNOSIS — E1165 Type 2 diabetes mellitus with hyperglycemia: Secondary | ICD-10-CM | POA: Diagnosis not present

## 2022-04-13 DIAGNOSIS — R000 Tachycardia, unspecified: Secondary | ICD-10-CM | POA: Diagnosis not present

## 2022-04-13 DIAGNOSIS — E1065 Type 1 diabetes mellitus with hyperglycemia: Secondary | ICD-10-CM | POA: Insufficient documentation

## 2022-04-13 LAB — BASIC METABOLIC PANEL
Anion gap: 7 (ref 5–15)
BUN: 17 mg/dL (ref 8–23)
CO2: 24 mmol/L (ref 22–32)
Calcium: 8.8 mg/dL — ABNORMAL LOW (ref 8.9–10.3)
Chloride: 101 mmol/L (ref 98–111)
Creatinine, Ser: 1.38 mg/dL — ABNORMAL HIGH (ref 0.61–1.24)
GFR, Estimated: 54 mL/min — ABNORMAL LOW (ref >60)
Glucose, Bld: 493 mg/dL — ABNORMAL HIGH (ref 70–99)
Potassium: 3.8 mmol/L (ref 3.5–5.1)
Sodium: 132 mmol/L — ABNORMAL LOW (ref 135–145)

## 2022-04-13 LAB — CBG MONITORING, ED
Glucose-Capillary: 470 mg/dL — ABNORMAL HIGH (ref 70–99)
Glucose-Capillary: 311 mg/dL — ABNORMAL HIGH (ref 70–99)

## 2022-04-13 LAB — URINALYSIS, ROUTINE W REFLEX MICROSCOPIC
Bacteria, UA: NONE SEEN
Bilirubin Urine: NEGATIVE
Glucose, UA: >=500 mg/dL — AB
Hgb urine dipstick: NEGATIVE
Ketones, ur: NEGATIVE mg/dL
Leukocytes,Ua: NEGATIVE
Nitrite: NEGATIVE
Protein, ur: NEGATIVE mg/dL
Specific Gravity, Urine: 1.032 — ABNORMAL HIGH (ref 1.005–1.030)
pH: 5 (ref 5.0–8.0)

## 2022-04-13 LAB — CBC
HCT: 35.9 % — ABNORMAL LOW (ref 39.0–52.0)
Hemoglobin: 12.6 g/dL — ABNORMAL LOW (ref 13.0–17.0)
MCH: 27.7 pg (ref 26.0–34.0)
MCHC: 35.1 g/dL (ref 30.0–36.0)
MCV: 78.9 fL — ABNORMAL LOW (ref 80.0–100.0)
Platelets: 282 10*3/uL (ref 150–400)
RBC: 4.55 MIL/uL (ref 4.22–5.81)
RDW: 15.7 % — ABNORMAL HIGH (ref 11.5–15.5)
WBC: 5.5 10*3/uL (ref 4.0–10.5)
nRBC: 0 % (ref 0.0–0.2)

## 2022-04-13 MED ORDER — INSULIN ASPART 100 UNIT/ML IJ SOLN
10.0000 [IU] | Freq: Once | INTRAMUSCULAR | Status: AC
  Administered 2022-04-13: 10 [IU] via INTRAVENOUS

## 2022-04-13 MED ORDER — SODIUM CHLORIDE 0.9 % IV BOLUS
1000.0000 mL | Freq: Once | INTRAVENOUS | Status: AC
Start: 2022-04-13 — End: 2022-04-13
  Administered 2022-04-13: 1000 mL via INTRAVENOUS

## 2022-04-13 NOTE — Discharge Instructions (Signed)
Please increase your dose of Novolin in the morning by 5 units every day if your blood sugar is over 300.  So tomorrow morning if it is over 300 please take 60 units, if it is under 300 take 55 units.  He will need to follow-up with your endocrinologist Dr. Loanne Drilling this week.  Emergency department for worsening symptoms ?

## 2022-04-13 NOTE — ED Triage Notes (Signed)
Pt states blood sugars have been up to 500 the last few days.  Stats he saw PCP Dr. Loanne Drilling on Tuesday or Wednesday and he had long acting insulin changed and has been increasing dosage by 10 units each day per Dr.  Quintella Baton he started at 40 units once per day and today was up to 70 units once per day.  States he feels fine and denies any complaints. ?

## 2022-04-13 NOTE — ED Provider Notes (Signed)
?[facility] ?Provider Note ? ? ?CSN: [id number] ?Arrival date & time: 04/13/22  1710 ? ?  ? ?History ? ?Chief Complaint  ?Patient presents with  ? Hyperglycemia  ? ? ?Christopher [name] is a 76 y.o. male. ? ? ?Hyperglycemia ? ?This patient is a 76 year old male with a known history of diabetes, he has been insulin-dependent for quite some time and has been followed by Dr. Loanne Drilling over the years.  This patient has been on Lantus insulin up until a couple of weeks ago when he was admitted to the hospital with hyperglycemia, he spent about 5 days in the hospital and was discharged on Humulin which she was taking in the mornings once a day.  Initially it was 40 units, and then went up to 45 then it went up to 42 however he continues to be severely hyperglycemic throughout the day.  The patient does not have any significant symptoms at this time but because the blood sugars were elevating they called the office of the endocrinologist who recommended that they come to the hospital for evaluation.  The patient denies fevers, chills, nausea, vomiting, diarrhea, cough, shortness of breath or swelling of the legs. ? ?Home Medications ?Prior to Admission medications   ?Medication Sig Start Date End Date Taking? Authorizing Provider  ?Alcohol Swabs (B-D SINGLE USE SWABS REGULAR) PADS USE TO CHECK BLOOD SUGAR 2 TIMES PER DAY 01/07/18   Renato Shin, MD  ?[facility] P 0.1 % SOLN Place 1 drop into both eyes daily.  03/03/11   [provider]  ?atorvastatin (LIPITOR) 80 MG tablet Take 1 tablet (80 mg total) by mouth daily. 03/25/22   Little Ishikawa, MD  ?Continuous Blood Gluc Receiver (FREESTYLE LIBRE 2 READER) DEVI 1 each by Does not apply route daily. 04/10/22   Isaac Bliss, Rayford Halsted, MD  ?Continuous Blood Gluc Sensor (FREESTYLE LIBRE 2 SENSOR) MISC 1 Device by Does not apply route every 14 (fourteen) days. 04/03/22   Isaac Bliss, Rayford Halsted, MD  ?dorzolamide-timolol (COSOPT)  22.3-6.8 MG/ML ophthalmic solution Place 1 drop into both eyes 2 (two) times daily.  03/03/11   [provider]  ?glucagon 1 MG injection Inject 1 mg into the muscle once as needed. 03/12/16   Renato Shin, MD  ?glucose blood [name] VERIO) test strip 1 each by Other route 2 (two) times daily. And lancets 2/day 04/11/22   Renato Shin, MD  ?Insulin NPH, Human,, Isophane, (NOVOLIN N FLEXPEN) 100 UNIT/ML Kiwkpen Inject 40 Units into the skin every morning. And pen needles 1/day 04/04/22   Renato Shin, MD  ?Insulin Pen Needle (PEN NEEDLES) 32G X 4 MM MISC 1 each by Does not apply route 3 (three) times daily before meals. 03/25/22 04/24/22  Little Ishikawa, MD  ?Insulin Syringe-Needle U-100 (BD VEO INSULIN SYRINGE U/F) 31G X 15/64" 1 ML MISC USE  SYRINGE TWICE DAILY 12/24/21   Renato Shin, MD  ?lisinopril (ZESTRIL) 5 MG tablet Take 1 tablet (5 mg total) by mouth daily. 04/03/22   Isaac Bliss, Rayford Halsted, MD  ?Multiple Vitamin (MULTIVITAMIN) tablet Take 1 tablet by mouth daily.    [provider]  ?   ? ?Allergies    ?Patient has no known allergies.   ? ?Review of Systems   ?Review of Systems  ?All other systems reviewed and are negative. ? ?Physical Exam ?Updated Vital Signs ?BP 121/83   Pulse 66   Temp 98.4 ?F (36.9 ?C) (Oral)   Resp 14  SpO2 100%  ?Physical Exam ?Vitals and nursing note reviewed.  ?Constitutional:   ?   General: He is not in acute distress. ?   Appearance: He is well-developed.  ?HENT:  ?   Head: Normocephalic and atraumatic.  ?   Mouth/Throat:  ?   Pharynx: No oropharyngeal exudate.  ?Eyes:  ?   General: No scleral icterus.    ?   Right eye: No discharge.     ?   Left eye: No discharge.  ?   Conjunctiva/sclera: Conjunctivae normal.  ?   Pupils: Pupils are equal, round, and reactive to light.  ?Neck:  ?   Thyroid: No thyromegaly.  ?   Vascular: No JVD.  ?Cardiovascular:  ?   Rate and Rhythm: Normal rate and regular rhythm.  ?   Heart sounds: Normal heart sounds. No murmur  heard. ?  No friction rub. No gallop.  ?Pulmonary:  ?   Effort: Pulmonary effort is normal. No respiratory distress.  ?   Breath sounds: Normal breath sounds. No wheezing or rales.  ?Abdominal:  ?   General: Bowel sounds are normal. There is no distension.  ?   Palpations: Abdomen is soft. There is no mass.  ?   Tenderness: There is no abdominal tenderness.  ?Musculoskeletal:     ?   General: No tenderness. Normal range of motion.  ?   Cervical back: Normal range of motion and neck supple.  ?Lymphadenopathy:  ?   Cervical: No cervical adenopathy.  ?Skin: ?   General: Skin is warm and dry.  ?   Findings: No erythema or rash.  ?Neurological:  ?   Mental Status: He is alert.  ?   Coordination: Coordination normal.  ?Psychiatric:     ?   Behavior: Behavior normal.  ? ? ?ED Results / Procedures / Treatments   ?Labs ?(all labs ordered are listed, but only abnormal results are displayed) ?Labs Reviewed  ?BASIC METABOLIC PANEL - Abnormal; Notable for the following components:  ?    Result Value  ? Sodium 132 (*)   ? Glucose, Bld 493 (*)   ? Creatinine, Ser 1.38 (*)   ? Calcium 8.8 (*)   ? GFR, Estimated 54 (*)   ? All other components within normal limits  ?CBC - Abnormal; Notable for the following components:  ? Hemoglobin 12.6 (*)   ? HCT 35.9 (*)   ? MCV 78.9 (*)   ? RDW 15.7 (*)   ? All other components within normal limits  ?URINALYSIS, ROUTINE W REFLEX MICROSCOPIC - Abnormal; Notable for the following components:  ? Specific Gravity, Urine 1.032 (*)   ? Glucose, UA >=500 (*)   ? All other components within normal limits  ?CBG MONITORING, ED - Abnormal; Notable for the following components:  ? Glucose-Capillary 470 (*)   ? All other components within normal limits  ?CBG MONITORING, ED - Abnormal; Notable for the following components:  ? Glucose-Capillary 311 (*)   ? All other components within normal limits  ?CBG MONITORING, ED  ? ? ?EKG ?None ? ?Radiology ?No results found. ? ?Procedures ?Procedures  ? ? ?Medications  Ordered in ED ?Medications  ?sodium chloride 0.9 % bolus 1,000 mL (0 mLs Intravenous Stopped 04/13/22 2045)  ?insulin aspart (novoLOG) injection 10 Units (10 Units Intravenous Given 04/13/22 1938)  ? ? ?ED Course/ Medical Decision Making/ A&P ?  ?                        ?  Medical Decision Making ?Amount and/or Complexity of Data Reviewed ?Labs: ordered. ? ?Risk ?Prescription drug management. ? ? ?This patient is in no distress, I have personally viewed his prior history of medical records and spoke with both he and his significant other at the bedside. ? ?This patient presents to the ED for concern of hyperglycemia, this involves an extensive number of treatment options, and is a complaint that carries with it a high risk of complications and morbidity.  The differential diagnosis includes medication noncompliance, needs a new medication regimen, could have underlying infection though this seems less likely ? ? ?Co morbidities that complicate the patient evaluation ? ?Chronic diabetes ? ? ?Additional history obtained: ? ?Additional history obtained from significant other, medical record ?External records from outside source obtained and reviewed including medical records and significant other, pharmacy reports ? ? ?Lab Tests: ? ?I Ordered, and personally interpreted labs.  The pertinent results include: CBC metabolic panel, hyperglycemia without DKA ? ? ?Cardiac Monitoring: / EKG: ? ?The patient was maintained on a cardiac monitor.  I personally viewed and interpreted the cardiac monitored which showed an underlying rhythm of: Normal sinus rhythm, no arrhythmia ? ? ?Consultations Obtained: ? ?I requested consultation with the ED pharmacy,  and discussed lab and imaging findings as well as pertinent plan - they recommend: Several different options however increasing Novolin in the morning for blood sugars that are elevated may be the easiest option for the patient ? ? ?Problem List / ED Course / Critical interventions /  Medication management ? ?I discussed the different options with the patient, he has requested to go with increasing the Novolin every morning until his blood sugars are in better range, I also discussed at

## 2022-04-15 ENCOUNTER — Telehealth: Payer: Self-pay | Admitting: Internal Medicine

## 2022-04-15 ENCOUNTER — Telehealth: Payer: Self-pay

## 2022-04-15 NOTE — Telephone Encounter (Signed)
Christopher [name] physical therapist with [facility] called just to inform that the patient has been experiencing high blood sugars during their encounter today. His meter is only showing up as "HIGH". ?

## 2022-04-15 NOTE — Telephone Encounter (Signed)
Bobby physical therapist with [facility] states patient is requesting to be discharged from physical therapy, stating he has made great improvement ?

## 2022-04-16 ENCOUNTER — Telehealth: Payer: Self-pay | Admitting: Pharmacy Technician

## 2022-04-16 ENCOUNTER — Other Ambulatory Visit (HOSPITAL_COMMUNITY): Payer: Self-pay

## 2022-04-16 NOTE — Telephone Encounter (Signed)
Patient confirmed that he is currently taking 55 units qam. Informed patient to now start taking 65 units qam and he expressed understanding. ?

## 2022-04-16 NOTE — Telephone Encounter (Signed)
Patient Advocate Encounter ? ?Received notification from [place] that prior authorization for FREESTYLE LIBRE 2 READER & SENSORS is required. ?  ?PA NOT NEEDED ?Will need to send to DME provider below ? ? ?Kahdijah Errickson R Wen [place], CPhT ?Patient Advocate ?[facility] ?Phone: [phone] ?Fax:  [phone] ? ?

## 2022-04-17 ENCOUNTER — Telehealth: Payer: Self-pay | Admitting: Internal Medicine

## 2022-04-17 NOTE — Telephone Encounter (Signed)
Angel from [facility] calling with status, states blood sugar is 469, he has taken 65 units of Novolog and is drinking water, states patient is asymptomatic. Glenard Haring can be reached at [phone] ?

## 2022-04-17 NOTE — Telephone Encounter (Signed)
Christopher [name] is aware to call Dr Cordelia Pen office for advise. ?

## 2022-04-19 ENCOUNTER — Other Ambulatory Visit: Payer: Self-pay

## 2022-04-19 ENCOUNTER — Ambulatory Visit (INDEPENDENT_AMBULATORY_CARE_PROVIDER_SITE_OTHER): Payer: Medicare HMO | Admitting: Endocrinology

## 2022-04-19 ENCOUNTER — Encounter: Payer: Self-pay | Admitting: Endocrinology

## 2022-04-19 VITALS — BP 106/64 | Ht 76.0 in | Wt 186.0 lb

## 2022-04-19 DIAGNOSIS — E108 Type 1 diabetes mellitus with unspecified complications: Secondary | ICD-10-CM

## 2022-04-19 LAB — POCT GLYCOSYLATED HEMOGLOBIN (HGB A1C): Hemoglobin A1C: 11.7 % — AB (ref 4.0–5.6)

## 2022-04-19 MED ORDER — FREESTYLE LIBRE 2 READER DEVI: 1.0000 | Freq: Every day | 2 refills | Status: DC

## 2022-04-19 MED ORDER — NOVOLIN N FLEXPEN 100 UNIT/ML ~~LOC~~ SUPN: 80.0000 [IU] | PEN_INJECTOR | SUBCUTANEOUS | 1 refills | Status: DC

## 2022-04-19 MED ORDER — FREESTYLE LIBRE 2 READER DEVI
1.0000 | Freq: Every day | 2 refills | Status: DC
  Filled 2022-04-19: qty 2, 28d supply, fill #0

## 2022-04-19 MED ORDER — ONETOUCH VERIO VI STRP: 1.0000 | ORAL_STRIP | Freq: Two times a day (BID) | 3 refills | Status: DC

## 2022-04-19 NOTE — Progress Notes (Signed)
? ?Subjective:  ? ? Patient ID: Christopher [name], male    DOB: 03/10/1946, 76 y.o.   MRN: [id number] ? ?HPI ?Pt returns for f/u of diabetes mellitus:  ?DM type: 1   ?Dx'ed: 2010.   ?Complications: PN and CRI.   ?Therapy: insulin since 2013.   ?DKA: once (2023) ?Severe hypoglycemia: last episode was in 2018.   ?Pancreatitis: never.  ?SDOH: he is back on QD insulin, after noncompliance with multiple daily injections; Lantus was changed to NPH, due to pattern of cbg's.  ins declined Trulicity. He is retired.   ?Other: he could not take lantus, due to AM hypoglycemia and PM hyperglycemia; however, he changed back after poor results with faster-acting qd insulin; He declines V-GO.  He eats meals at 7AM, 1PM, and 6PM.   ?Interval history: he takes 65 units qam.  Wife says cbg varies from 240-500.  It is in general higher as the day goes on.  Pt says he swirls insulin before injecting.  No recent steroids.  ?Past Medical History:  ?Diagnosis Date  ? Diabetes mellitus type II   ? GERD (gastroesophageal reflux disease)   ? Glaucoma   ? Hyperlipidemia   ? Hypertension   ? ? ?Past Surgical History:  ?Procedure Laterality Date  ? COLONOSCOPY    ? HERNIA REPAIR    ? [place]  ? ? ?Social History  ? ?Socioeconomic History  ? Marital status: Married  ?  Spouse name: Not on file  ? Number of children: Not on file  ? Years of education: Not on file  ? Highest education level: Not on file  ?Occupational History  ? Not on file  ?Tobacco Use  ? Smoking status: Never  ? Smokeless tobacco: Never  ?Vaping Use  ? Vaping Use: Never used  ?Substance and Sexual Activity  ? Alcohol use: No  ? Drug use: No  ? Sexual activity: Not on file  ?Other Topics Concern  ? Not on file  ?Social History Narrative  ? Not on file  ? ?Social Determinants of Health  ? ?Financial Resource Strain: Not on file  ?Food Insecurity: Not on file  ?Transportation Needs: Not on file  ?Physical Activity: Not on file  ?Stress: Not on file  ?Social  Connections: Not on file  ?Intimate Partner Violence: Not on file  ? ? ?Current Outpatient Medications on File Prior to Visit  ?Medication Sig Dispense Refill  ? Alcohol Swabs (B-D SINGLE USE SWABS REGULAR) PADS USE TO CHECK BLOOD SUGAR 2 TIMES PER DAY 200 each prn  ? ALPHAGAN P 0.1 % SOLN Place 1 drop into both eyes daily.     ? atorvastatin (LIPITOR) 80 MG tablet Take 1 tablet (80 mg total) by mouth daily. 90 tablet 1  ? Continuous Blood Gluc Sensor (FREESTYLE LIBRE 2 SENSOR) MISC 1 Device by Does not apply route every 14 (fourteen) days. 6 each 3  ? dorzolamide-timolol (COSOPT) 22.3-6.8 MG/ML ophthalmic solution Place 1 drop into both eyes 2 (two) times daily.     ? glucagon 1 MG injection Inject 1 mg into the muscle once as needed. 1 each 12  ? Insulin Pen Needle (PEN NEEDLES) 32G X 4 MM MISC 1 each by Does not apply route 3 (three) times daily before meals. 200 each 0  ? Insulin Syringe-Needle U-100 (BD VEO INSULIN SYRINGE U/F) 31G X 15/64" 1 ML MISC USE  SYRINGE TWICE DAILY 200 each 5  ? lisinopril (ZESTRIL) 5 MG tablet  Take 1 tablet (5 mg total) by mouth daily. 90 tablet 1  ? Multiple Vitamin (MULTIVITAMIN) tablet Take 1 tablet by mouth daily.    ? ?No current facility-administered medications on file prior to visit.  ? ? ?No Known Allergies ? ?Family History  ?Problem Relation Age of Onset  ? Alcohol abuse Father   ? Diabetes Sister   ? Colon cancer Neg Hx   ? Esophageal cancer Neg Hx   ? Stomach cancer Neg Hx   ? Rectal cancer Neg Hx   ? ? ?BP 106/64   Ht '6\' 4"'$  (1.93 m)   Wt 186 lb (84.4 kg)   BMI 22.64 kg/m?  ? ?Review of Systems ?Denies N/V ?   ?Objective:  ? Physical Exam ?VITAL SIGNS:  See vs page ?GENERAL: no distress ? ? ?Lab Results  ?Component Value Date  ? HGBA1C 10.8 (A) 03/18/2022  ? ? ?Lab Results  ?Component Value Date  ? CREATININE 1.38 (H) 04/13/2022  ? BUN 17 04/13/2022  ? NA 132 (L) 04/13/2022  ? K 3.8 04/13/2022  ? CL 101 04/13/2022  ? CO2 24 04/13/2022  ? ? ?Lab Results  ?Component  Value Date  ? WBC 5.5 04/13/2022  ? HGB 12.6 (L) 04/13/2022  ? HCT 35.9 (L) 04/13/2022  ? MCV 78.9 (L) 04/13/2022  ? PLT 282 04/13/2022  ? ?   ?Assessment & Plan:  ?Type 1 DM: uncontrolled.  With evolution from T2 to T2, insulin need has increased ? ?Patient Instructions  ?check your blood sugar twice a day.  vary the time of day when you check, between before the 3 meals, and at bedtime.  also check if you have symptoms of your blood sugar being too high or too low.  please keep a record of the readings and bring it to your next appointment here (or you can bring the meter itself).  You can write it on any piece of paper.  please call us sooner if your blood sugar goes below 70, or if you have a lot of readings over 200.    ?I have sent a prescription to your pharmacy, to increase the insulin to 80 units each morning ?On this type of insulin schedule, you should eat meals on a regular schedule.  In particular, you should eat lunch by noon.  If a meal is missed or significantly delayed, your blood sugar could go low.   ?Please call or message [place] next week, to tell us how the blood sugar is doing.   ?You should have an endocrinology follow-up appointment in 2 months.    ? ? ?

## 2022-04-19 NOTE — Patient Instructions (Signed)
check your blood sugar twice a day.  vary the time of day when you check, between before the 3 meals, and at bedtime.  also check if you have symptoms of your blood sugar being too high or too low.  please keep a record of the readings and bring it to your next appointment here (or you can bring the meter itself).  You can write it on any piece of paper.  please call us sooner if your blood sugar goes below 70, or if you have a lot of readings over 200.    ?I have sent a prescription to your pharmacy, to increase the insulin to 80 units each morning ?On this type of insulin schedule, you should eat meals on a regular schedule.  In particular, you should eat lunch by noon.  If a meal is missed or significantly delayed, your blood sugar could go low.   ?Please call or message [place] next week, to tell us how the blood sugar is doing.   ?You should have an endocrinology follow-up appointment in 2 months.    ?

## 2022-04-25 ENCOUNTER — Other Ambulatory Visit (HOSPITAL_COMMUNITY): Payer: Self-pay

## 2022-04-26 ENCOUNTER — Other Ambulatory Visit (HOSPITAL_COMMUNITY): Payer: Self-pay

## 2022-04-30 ENCOUNTER — Encounter: Payer: Medicare HMO | Attending: Internal Medicine | Admitting: Nutrition

## 2022-04-30 ENCOUNTER — Other Ambulatory Visit: Payer: Self-pay

## 2022-04-30 DIAGNOSIS — E108 Type 1 diabetes mellitus with unspecified complications: Secondary | ICD-10-CM | POA: Diagnosis not present

## 2022-04-30 DIAGNOSIS — E1111 Type 2 diabetes mellitus with ketoacidosis with coma: Secondary | ICD-10-CM | POA: Diagnosis present

## 2022-04-30 DIAGNOSIS — Z713 Dietary counseling and surveillance: Secondary | ICD-10-CM | POA: Diagnosis not present

## 2022-04-30 DIAGNOSIS — E1011 Type 1 diabetes mellitus with ketoacidosis with coma: Secondary | ICD-10-CM | POA: Diagnosis not present

## 2022-04-30 MED ORDER — NOVOLOG FLEXPEN 100 UNIT/ML ~~LOC~~ SOPN: 5.0000 [IU] | PEN_INJECTOR | Freq: Three times a day (TID) | SUBCUTANEOUS | 1 refills | Status: DC

## 2022-04-30 MED ORDER — LANTUS SOLOSTAR 100 UNIT/ML ~~LOC~~ SOPN: 50.0000 [IU] | PEN_INJECTOR | Freq: Every day | SUBCUTANEOUS | 1 refills | Status: DC

## 2022-04-30 NOTE — Telephone Encounter (Signed)
Pt was seen by Diabetic educator and new Rx was requested. On-call provider approved rx for Lantus 50 units daily and Novolog 5-7 units TID be sent to pt preferred pharmacy. ?

## 2022-04-30 NOTE — Progress Notes (Signed)
Patient is here with his wife to discuss his diet  Wife kept diet record for 3 weeks since being discharged from the hospital for diabetic Ketoacidosis  ?Weight down 3.2 pounds in 10 days.  Pt. Extremely thirsty, urinating 3X/hour, very tired, no energy,  denies nausea, but says breathing more heavily.   ?Insulin dose:  70u NPH insulin q AM. ?SBGM: Libre 2 FBSs 250-350, acL: 250-400, acS: 260-425. ? ?Diet: ?Meals are low in carb-0-30/meal, protein at most meals, except for oatmeal in the AM 2X weekly.  Snacks on raw veg., and occasional 2 sugar free cookie at HS 2X/wk.  All other meals are high in protein and low in fat and carbs.   ?Bfast  7AM:  Oatmeal, plain made with sugar free almond milk, or eggs with sausage or bacon-2 pieces.  Water to drink ?Lunch 12PM: chicken or meat for sandwich with veg., water to drink ?Supper:  5-6PM: meat and non starchy veg., rare potatoes. Water to drink ?HS: 2 sugar free cookies 2X/wk.   ?Discussion: ?Discussed need to add protein to oatmeal, and the need for carbohydrates at each meal.  Discussed serving sizes of 30 grams at breakfast, 30 at lunch, and 45 at supper, with 15 at bedtime snack. ?Discussed need for fast-acting insulin before each meal. ?Reviewed blood sugar readings with Dr. Cruzita Lederer, as well as new insulin doses.  ? Written instructions given for Lantus: 50u QAM, and Novolog 5-7u, 10 minutes before all meal.  Discussed how these insulins work to cover the blood sugar rises after meals and before breakfast.  They reported good understanding of this. ? ?

## 2022-04-30 NOTE — Patient Instructions (Signed)
Take Lantus 50u before breakfast each morning ?Take Novolog 5-7u  10 min. Before all meals. ?Add 1/4c nuts to oatmeal.   ?Make sure all meals have 2-3 servings of carbohydrates  ?

## 2022-05-01 ENCOUNTER — Telehealth: Payer: Self-pay | Admitting: Nutrition

## 2022-05-01 NOTE — Telephone Encounter (Signed)
Christopher [name], this is a good plan. ?Thank you! ?C ?

## 2022-05-01 NOTE — Telephone Encounter (Signed)
Patient reports that FBS today was 67.  Drank some juice and it rose to 75.  He takes his Lantus dose in the AM, so he took his first dose of Lantus-50u as directed at 8AM, but reduced his Novolog dose from 7 to 5 and then ate breakfast.   ?Plan:  ?If FBS tomorrow is less than 90, reduce AM dose of Lantus to 45u.  I will call him tomorrow to see how he is doing.  His Elenor [place]ato is not linked to [place], because he prefers the reader.  Stressed need to see readings to evaluate the readings during the night and pc meals.  She will use the phone to start the next sensor, and directions were given on how to link the phone to this practice.  I will confirm tomorrow that she did this.  ?

## 2022-05-06 NOTE — Telephone Encounter (Signed)
Patient started on Lantus 50u Q AM and Novolog 5-7u ac meals on Thursday 05/02/22.  His FBSs have been in the mid to upper 200s both fasting and ac meals.   He did not test this AM.  Yesterday it was 269.  Plan:  increase Lantus dose to 55u.  And test ac and HS.  Please write them down, and call in the AM.

## 2022-05-08 ENCOUNTER — Telehealth: Payer: Self-pay | Admitting: Nutrition

## 2022-05-08 NOTE — Telephone Encounter (Signed)
Patient reports no low blood sugars.  FBS today was 166, readings before supper are in the in slightly higher, but FBSs are in the low 200s.  He was told to increase his Novolog from [place] to [place] before supper only.  He agreed to do this and had no final questions.

## 2022-05-15 MED ORDER — LISINOPRIL 5 MG PO TABS: 5.0000 mg | ORAL_TABLET | Freq: Every day | ORAL | 1 refills | Status: DC

## 2022-05-15 NOTE — Addendum Note (Signed)
Addended by: Lelon Frohlich Y on: 05/15/2022 02:00 PM   Modules accepted: Orders

## 2022-05-19 ENCOUNTER — Other Ambulatory Visit: Payer: Self-pay | Admitting: Endocrinology

## 2022-05-19 DIAGNOSIS — E108 Type 1 diabetes mellitus with unspecified complications: Secondary | ICD-10-CM

## 2022-05-20 ENCOUNTER — Other Ambulatory Visit: Payer: Medicare HMO

## 2022-05-20 ENCOUNTER — Other Ambulatory Visit (INDEPENDENT_AMBULATORY_CARE_PROVIDER_SITE_OTHER): Payer: Medicare HMO

## 2022-05-20 DIAGNOSIS — E108 Type 1 diabetes mellitus with unspecified complications: Secondary | ICD-10-CM | POA: Diagnosis not present

## 2022-05-20 LAB — COMPREHENSIVE METABOLIC PANEL
ALT: 18 U/L (ref 0–53)
AST: 19 U/L (ref 0–37)
Albumin: 3.7 g/dL (ref 3.5–5.2)
Alkaline Phosphatase: 90 U/L (ref 39–117)
BUN: 14 mg/dL (ref 6–23)
CO2: 28 mEq/L (ref 19–32)
Calcium: 9.1 mg/dL (ref 8.4–10.5)
Chloride: 109 mEq/L (ref 96–112)
Creatinine, Ser: 1.22 mg/dL (ref 0.40–1.50)
GFR: 58.24 mL/min — ABNORMAL LOW (ref >60.00)
Glucose, Bld: 114 mg/dL — ABNORMAL HIGH (ref 70–99)
Potassium: 3.7 mEq/L (ref 3.5–5.1)
Sodium: 143 mEq/L (ref 135–145)
Total Bilirubin: 0.8 mg/dL (ref 0.2–1.2)
Total Protein: 6.3 g/dL (ref 6.0–8.3)

## 2022-05-20 LAB — LIPID PANEL
Cholesterol: 153 mg/dL (ref 0–200)
HDL: 53.8 mg/dL (ref >39.00)
LDL Cholesterol: 88 mg/dL (ref 0–99)
NonHDL: 99.2
Total CHOL/HDL Ratio: 3
Triglycerides: 57 mg/dL (ref 0.0–149.0)
VLDL: 11.4 mg/dL (ref 0.0–40.0)

## 2022-05-20 LAB — MICROALBUMIN / CREATININE URINE RATIO
Creatinine,U: 78.8 mg/dL
Microalb Creat Ratio: 0.9 mg/g (ref 0.0–30.0)
Microalb, Ur: <0.7 mg/dL (ref 0.0–1.9)

## 2022-05-21 LAB — HEMOGLOBIN A1C
Hgb A1c MFr Bld: 9.5 % of total Hgb — ABNORMAL HIGH (ref <5.7)
Mean Plasma Glucose: 226 mg/dL
eAG (mmol/L): 12.5 mmol/L

## 2022-05-22 ENCOUNTER — Encounter: Payer: Self-pay | Admitting: Endocrinology

## 2022-05-23 ENCOUNTER — Ambulatory Visit: Payer: Medicare HMO | Admitting: Endocrinology

## 2022-05-23 VITALS — BP 144/84 | HR 66 | Ht 75.0 in | Wt 198.8 lb

## 2022-05-23 DIAGNOSIS — R609 Edema, unspecified: Secondary | ICD-10-CM | POA: Diagnosis not present

## 2022-05-23 DIAGNOSIS — E108 Type 1 diabetes mellitus with unspecified complications: Secondary | ICD-10-CM | POA: Diagnosis not present

## 2022-05-23 DIAGNOSIS — E1065 Type 1 diabetes mellitus with hyperglycemia: Secondary | ICD-10-CM | POA: Diagnosis not present

## 2022-05-23 MED ORDER — FUROSEMIDE 20 MG PO TABS: 20.0000 mg | ORAL_TABLET | Freq: Every day | ORAL | 3 refills | Status: DC

## 2022-05-23 NOTE — Patient Instructions (Signed)
LANTUS insulin: Take 25 units at breakfast and dinnertime If waking up with blood sugars under 90 reduce the evening dose by another 2 units If blood sugars BEFORE dinnertime are consistently under 90 reduce the morning dose by 2 units  NOVOLOG: Take 5 units for oatmeal Also at lunch and dinner take 5 units for smaller meals, 7 units for usual/average meals and 9 units for eating out, large meals or dessert  Take furosemide fluid pill until ankle swelling is clearing and then stop

## 2022-05-23 NOTE — Progress Notes (Unsigned)
Patient ID: Christopher [name], male   DOB: 04-Aug-1946, 76 y.o.   MRN: [id number]    Chief complaint : Follow up of Type 1 Diabetes  History of Present Illness:          Date of diagnosis: 2010  Prior history:  Previous A1c range has been 8.4-11.6   Therapy: insulin since 2013.   DKA: once (2023) Severe hypoglycemia: last episode was in 2018.  Non-insulin hypoglycemic drugs: Previously had been on Trulicity and Victoza which were stopped Previous insulins tried include Levemir and NPH  INSULIN regimen : Lantus 55 units in a.m. daily  Novolog before meals: 7-7-8  Recent history:     A1c is now at 9.5 compared to 11.7 last month  He was admitted to the hospital for ketoacidosis in 4/23 Before that he was taking NPH insulin and progressively higher doses but was recommended Lantus 30 units on discharge  When he was seen by the diabetes educator on 04/30/2022 for the first time his blood sugars were not evenly controlled even though he was taking up to 70 units of NPH He was then changed to Lantus 50 units in the morning daily and NovoLog before meals after discussion with diabetes educator and attending physician Discussed further increase to 55 units He does not adjust his mealtime dose based on carbohydrates or portions and takes to standard dose each time, usually inject before eating His blood sugar patterns are assessed by the freestyle libre version 2 Subsequently his blood sugars improved dramatically and he is now having tendency to hypoglycemia with 18% of his blood sugars in the last 2 weeks below 70 This is occurring at all different times of the day except after dinner when his blood sugars are the highest but most frequently overnight and after lunch  Glucose patterns from CGM for the last 2 weeks: Fasting glucose: These are somewhat variable especially in the last week even though average is about 100 with ranges between the slightly low blood sugars up to about 180 but  recently no low sugars on waking up Overnight sugars are also quite variable with some tendency to decrease between 1 AM and 7 AM overall periodic low sugars overnight Postprandial glucoses recently have been variable only occasionally spiking up after dinner and once rebound from low sugar in the morning but generally lower after lunch averaging 147 and higher after dinner Average blood sugar after dinner is 162 with significant variability Hyperglycemia recently has been sporadic including yesterday when his blood sugars were high most of the day for unknown reasons Hypoglycemia: Has occurred periodically overnight and between 1 PM-6 PM inconsistently     CGM use % of time 87  2-week average/GV 135/52  Time in range 58  % Time Above 180 16+8  % Time above 250   % Time Below 70 18      Symptoms of hypoglycemia: Weakness, shakiness        Self-care: The diet that the patient has been following is: Relatively low fat and carbohydrate May have sugar-free cookies at bedtime Breakfast may be oatmeal with almond milk or eggs and sausage         Exercise: No consistent activity.          Most recent dietitian/nurse educator visit :    04/2022       Diabetes labs:  Lab Results  Component Value Date   HGBA1C 9.5 (H) 05/20/2022   HGBA1C 11.7 (A) 04/19/2022   HGBA1C 10.8 (  A) 03/18/2022   Lab Results  Component Value Date   MICROALBUR <0.7 05/20/2022   LDLCALC 88 05/20/2022   CREATININE 1.22 05/20/2022    Lab on 05/20/2022  Component Date Value Ref Range Status   Hgb A1c MFr Bld 05/20/2022 9.5 (H)  <5.7 % of total Hgb Final   Comment: For someone without known diabetes, a hemoglobin A1c value of 6.5% or greater indicates that they may have  diabetes and this should be confirmed with a follow-up  test. . For someone with known diabetes, a value <7% indicates  that their diabetes is well controlled and a value  greater than or equal to 7% indicates suboptimal  control. A1c  targets should be individualized based on  duration of diabetes, age, comorbid conditions, and  other considerations. . Currently, no consensus exists regarding use of hemoglobin A1c for diagnosis of diabetes for children. .    Mean Plasma Glucose 05/20/2022 226  mg/dL Final   eAG (mmol/L) 05/20/2022 12.5  mmol/L Final  Lab on 05/20/2022  Component Date Value Ref Range Status   Cholesterol 05/20/2022 153  0 - 200 mg/dL Final   ATP III Classification       Desirable:  < 200 mg/dL               Borderline High:  200 - 239 mg/dL          High:  > = 240 mg/dL   Triglycerides 05/20/2022 57.0  0.0 - 149.0 mg/dL Final   Normal:  <150 mg/dLBorderline High:  150 - 199 mg/dL   HDL 05/20/2022 53.80  >39.00 mg/dL Final   VLDL 05/20/2022 11.4  0.0 - 40.0 mg/dL Final   LDL Cholesterol 05/20/2022 88  0 - 99 mg/dL Final   Total CHOL/HDL Ratio 05/20/2022 3   Final                  Men          Women1/2 Average Risk     3.4          3.3Average Risk          5.0          4.42X Average Risk          9.6          7.13X Average Risk          15.0          11.0                       NonHDL 05/20/2022 99.20   Final   NOTE:  Non-HDL goal should be 30 mg/dL higher than patient's LDL goal (i.e. LDL goal of < 70 mg/dL, would have non-HDL goal of < 100 mg/dL)   Microalb, Ur 05/20/2022 <0.7  0.0 - 1.9 mg/dL Final   Creatinine,U 05/20/2022 78.8  mg/dL Final   Microalb Creat Ratio 05/20/2022 0.9  0.0 - 30.0 mg/g Final   Sodium 05/20/2022 143  135 - 145 mEq/L Final   Potassium 05/20/2022 3.7  3.5 - 5.1 mEq/L Final   Chloride 05/20/2022 109  96 - 112 mEq/L Final   CO2 05/20/2022 28  19 - 32 mEq/L Final   Glucose, Bld 05/20/2022 114 (H)  70 - 99 mg/dL Final   BUN 05/20/2022 14  6 - 23 mg/dL Final   Creatinine, Ser 05/20/2022 1.22  0.40 - 1.50 mg/dL Final   Total Bilirubin 05/20/2022 0.8  0.2 - 1.2 mg/dL Final   Alkaline Phosphatase 05/20/2022 90  39 - 117 U/L Final   AST 05/20/2022 19  0 - 37 U/L Final   ALT  05/20/2022 18  0 - 53 U/L Final   Total Protein 05/20/2022 6.3  6.0 - 8.3 g/dL Final   Albumin 05/20/2022 3.7  3.5 - 5.2 g/dL Final   GFR 05/20/2022 58.24 (L)  >60.00 mL/min Final   Calculated using the CKD-EPI Creatinine Equation (2021)   Calcium 05/20/2022 9.1  8.4 - 10.5 mg/dL Final    Allergies as of 05/23/2022   No Known Allergies      Medication List        Accurate as of May 23, 2022 11:40 AM. If you have any questions, ask your nurse or doctor.          Alphagan P 0.1 % Soln Generic drug: brimonidine Place 1 drop into both eyes daily.   atorvastatin 80 MG tablet Commonly known as: LIPITOR Take 1 tablet (80 mg total) by mouth daily.   B-D SINGLE USE SWABS REGULAR Pads USE TO CHECK BLOOD SUGAR 2 TIMES PER DAY   BD Veo Insulin Syringe U/F 31G X 15/64" 1 ML Misc Generic drug: Insulin Syringe-Needle U-100 USE  SYRINGE TWICE DAILY   dorzolamide-timolol 22.3-6.8 MG/ML ophthalmic solution Commonly known as: COSOPT Place 1 drop into both eyes 2 (two) times daily.   FreeStyle Libre 2 Reader [place] 1 each by Does not apply route daily.   FreeStyle Libre 2 Sensor Misc 1 Device by Does not apply route every 14 (fourteen) days.   glucagon 1 MG injection Inject 1 mg into the muscle once as needed.   Lantus SoloStar 100 UNIT/ML Solostar Pen Generic drug: insulin glargine Inject 50 Units into the skin daily.   lisinopril 5 MG tablet Commonly known as: ZESTRIL Take 1 tablet (5 mg total) by mouth daily.   multivitamin tablet Take 1 tablet by mouth daily.   NovoLOG FlexPen 100 UNIT/ML FlexPen Generic drug: insulin aspart Inject 5-7 Units into the skin 3 (three) times daily with meals.   OneTouch Verio test strip Generic drug: glucose blood 1 each by Other route 2 (two) times daily. And lancets 2/day        Allergies: No Known Allergies  Past Medical History:  Diagnosis Date   Diabetes mellitus type II    GERD (gastroesophageal reflux disease)     Glaucoma    Hyperlipidemia    Hypertension     Past Surgical History:  Procedure Laterality Date   COLONOSCOPY     HERNIA REPAIR     PATELLA FRACTURE SURGERY  1993    Family History  Problem Relation Age of Onset   Alcohol abuse Father    Diabetes Sister    Colon cancer Neg Hx    Esophageal cancer Neg Hx    Stomach cancer Neg Hx    Rectal cancer Neg Hx     Social History:  reports that he has never smoked. He has never used smokeless tobacco. He reports that he does not drink alcohol and does not use drugs.    Review of Systems:   Blood pressure: Usually controlled, only on 5 mg lisinopril  BP Readings from Last 3 Encounters:  05/23/22 (!) 144/84  04/19/22 106/64  04/13/22 117/83     Lipids: He has been treated with 80 mg Lipitor prescribed by other physicians  Lab Results  Component Value Date   CHOL 153 05/20/2022  HDL 53.80 05/20/2022   LDLCALC 88 05/20/2022   LDLDIRECT 135.3 06/17/2013   TRIG 57.0 05/20/2022   CHOLHDL 3 05/20/2022     Diabetes complications: None, no microalbuminuria Last eye exam 2/23 Last foot exam 10/22  For the last couple of weeks he has had more swelling of his ankles and has not discussed with his PCP  Physical Examination:  BP (!) 144/84   Pulse 66   Ht '6\' 3"'$  (1.905 m)   Wt 198 lb 12.8 oz (90.2 kg)   SpO2 99%   BMI 24.85 kg/m   2+ pedal edema is present      ASSESSMENT/PLAN   Diabetes type 1 with persistently poor control with A1c currently over 8%   Problems identified: Inadequate time in range with 18% of sugars below 70  inadequate basal bolus insulin regimen but currently getting excessive hypoglycemia from mostly large amounts of Lantus insulin taking once a day Also has tendency to lower blood sugars after breakfast and lunch with 7 units of NovoLog May not be getting enough protein at breakfast with only oatmeal Variable blood sugars after dinner and no use of carbohydrate counting or factoring fat  intake No mealtime insulin adjustment based on carbohydrates or Premeal blood sugar Treatment with GLP-1 drugs not necessary as he is type I and his A1c is below 25 Needs consistent and regular exercise also    Recommendations:  LANTUS insulin: Take 25 units at breakfast and dinnertime If waking up with blood sugars under 90 reduce the evening dose by another 2 units If blood sugars BEFORE dinnertime are consistently under 90 reduce the morning dose by 2 units  NOVOLOG: Take 5 units for oatmeal Also at lunch and dinner take 5 units for smaller meals, 7 units for usual/average meals and 9 units for eating out, large meals or dessert Take mealtime insulin a few minutes before starting to eat Make sure to have some protein with breakfast and given him examples of proteins Take furosemide fluid pill until ankle swelling is clearing and then stop Consider follow-up with diabetes educator to review carbohydrate counting and mealtime insulin adjustment Also consider using closed-loop insulin pump system for more optimal treatment and reduction in hypoglycemia and blood sugar variability  EDEMA: This is likely to be from recent over insulinization and rapid improvement in blood sugars with fluid retention Also associated with some increase in blood pressure Will give him a short course of Lasix 20 mg daily till edema is cleared   Patient Instructions  LANTUS insulin: Take 25 units at breakfast and dinnertime If waking up with blood sugars under 90 reduce the evening dose by another 2 units If blood sugars BEFORE dinnertime are consistently under 90 reduce the morning dose by 2 units  NOVOLOG: Take 5 units for oatmeal Also at lunch and dinner take 5 units for smaller meals, 7 units for usual/average meals and 9 units for eating out, large meals or dessert  Take furosemide fluid pill until ankle swelling is clearing and then stop                    Total visit time for today's visit for  assessment, counseling and review of extensive records and history as well as management of related problems = 40 minutes      Myrella Fahs 05/23/2022, 11:40 AM

## 2022-05-29 ENCOUNTER — Encounter: Payer: Self-pay | Admitting: Endocrinology

## 2022-06-10 ENCOUNTER — Telehealth: Payer: Self-pay

## 2022-06-10 NOTE — Telephone Encounter (Signed)
Pt called into the office in regards to [facility] paperwork. He stated he called the [facility] and they stated they have not received it yet. Pt would like to be called in regards to paperwork.

## 2022-06-17 ENCOUNTER — Ambulatory Visit: Payer: Medicare HMO | Admitting: Internal Medicine

## 2022-07-29 ENCOUNTER — Telehealth: Payer: Self-pay | Admitting: Endocrinology

## 2022-07-29 DIAGNOSIS — E108 Type 1 diabetes mellitus with unspecified complications: Secondary | ICD-10-CM

## 2022-07-29 MED ORDER — "BD VEO INSULIN SYRINGE U/F 31G X 15/64"" 1 ML MISC": 5 refills | Status: DC

## 2022-07-29 NOTE — Telephone Encounter (Signed)
done

## 2022-07-29 NOTE — Telephone Encounter (Signed)
MEDICATION: Insulin Syringe-Needle U-100 (BD VEO INSULIN SYRINGE U/F) 31G X 15/64" 1 ML MISC [[id number]]   PHARMACY:  [facility] on [address]   HAS THE PATIENT CONTACTED THEIR PHARMACY? yes   IS PATIENT OUT OF MEDICATION: no  IF NOT; HOW MUCH IS LEFT: 3-4  LAST APPOINTMENT DATE: '@6'$ /07/2022  NEXT APPOINTMENT DATE:'@8'$ /28/2023  DO WE HAVE YOUR PERMISSION TO LEAVE A DETAILED MESSAGE?: yes, [phone]

## 2022-08-10 ENCOUNTER — Other Ambulatory Visit: Payer: Self-pay | Admitting: Endocrinology

## 2022-08-10 DIAGNOSIS — E108 Type 1 diabetes mellitus with unspecified complications: Secondary | ICD-10-CM

## 2022-08-11 ENCOUNTER — Other Ambulatory Visit: Payer: Self-pay | Admitting: Endocrinology

## 2022-08-12 ENCOUNTER — Other Ambulatory Visit: Payer: Medicare HMO

## 2022-08-12 ENCOUNTER — Other Ambulatory Visit (INDEPENDENT_AMBULATORY_CARE_PROVIDER_SITE_OTHER): Payer: Medicare HMO

## 2022-08-12 DIAGNOSIS — E108 Type 1 diabetes mellitus with unspecified complications: Secondary | ICD-10-CM

## 2022-08-12 LAB — BASIC METABOLIC PANEL
BUN: 15 mg/dL (ref 6–23)
CO2: 27 mEq/L (ref 19–32)
Calcium: 9.3 mg/dL (ref 8.4–10.5)
Chloride: 106 mEq/L (ref 96–112)
Creatinine, Ser: 1.48 mg/dL (ref 0.40–1.50)
GFR: 46.12 mL/min — ABNORMAL LOW (ref >60.00)
Glucose, Bld: 142 mg/dL — ABNORMAL HIGH (ref 70–99)
Potassium: 3.4 mEq/L — ABNORMAL LOW (ref 3.5–5.1)
Sodium: 140 mEq/L (ref 135–145)

## 2022-08-13 LAB — HEMOGLOBIN A1C
Est. average glucose Bld gHb Est-mCnc: 243 mg/dL
Hgb A1c MFr Bld: 10.1 % — ABNORMAL HIGH (ref 4.8–5.6)

## 2022-08-14 ENCOUNTER — Encounter: Payer: Self-pay | Admitting: Endocrinology

## 2022-08-14 ENCOUNTER — Ambulatory Visit: Payer: Medicare HMO | Admitting: Endocrinology

## 2022-08-14 VITALS — BP 126/82 | HR 52 | Ht 75.0 in | Wt 202.8 lb

## 2022-08-14 DIAGNOSIS — E108 Type 1 diabetes mellitus with unspecified complications: Secondary | ICD-10-CM

## 2022-08-14 DIAGNOSIS — E1065 Type 1 diabetes mellitus with hyperglycemia: Secondary | ICD-10-CM

## 2022-08-14 MED ORDER — NOVOLOG FLEXPEN 100 UNIT/ML ~~LOC~~ SOPN: 8.0000 [IU] | PEN_INJECTOR | Freq: Three times a day (TID) | SUBCUTANEOUS | 1 refills | Status: DC

## 2022-08-14 MED ORDER — TOUJEO SOLOSTAR 300 UNIT/ML ~~LOC~~ SOPN: PEN_INJECTOR | SUBCUTANEOUS | 1 refills | Status: DC

## 2022-08-14 NOTE — Patient Instructions (Signed)
Lantus 34 in am and keep 24 in pm  Novolog 8 units in am, 8-10 at lunch and dinner  Skip the Novolog if no meal

## 2022-08-14 NOTE — Progress Notes (Signed)
Patient ID: Christopher [name], male   DOB: Nov 04, 1946, 76 y.o.   MRN: [id number]    Chief complaint : Follow up of Type 1 Diabetes  History of Present Illness:          Date of diagnosis: 2010  Prior history:  Previous A1c range has been 8.4-11.6   Therapy: insulin since 2013.   DKA: once (2023) Severe hypoglycemia: last episode was in 2018.  Non-insulin hypoglycemic drugs: Previously had been on Trulicity and Victoza which were stopped Previous insulins tried include Levemir and NPH  INSULIN regimen : Lantus 24 units bid Novolog with syringe before meals: 6 units before meals  Recent history:     A1c is now at 10.1 and relatively higher  He was told to try taking Lantus twice a day However this does not appear to be improving his blood sugar patterns and still has significant hyperglycemia Overall it appears to be needing higher amounts of basal insulin but fasting and before dinner readings are highly variable He denies that he is missing any doses of Lantus Also he appears to be taking his NovoLog 3 times a day regardless of mealtimes even if he does not eat Also likely not taking the mealtime dose when eating out since he does not know if he can carry his insulin with him Not adjusting NovoLog based on meal size Unclear whether he has hypoglycemia related to variable action of insulin or inherent brittleness  Glucose patterns from CGM for the last 2 weeks: Overall blood sugars show high variability at all times with no consistent pattern On average blood sugars are ABOVE target at all times HIGHEST that sugars are late evening and after breakfast  OVERNIGHT blood sugars are highly variable also with periods of significant HYPERGLYCEMIA but also on at least 1 occasion low blood sugars  Blood sugars appear to be generally higher in the last 7 days with average 288 as of 'Sunday for daily average  HYPERGLYCEMIA episodes are occurring at all different times but highest as above   POSTPRANDIAL blood sugars tend to rise mostly if Premeal blood sugar is near normal or low normal otherwise incremental rises not excessive usually  High blood sugars are not being corrected down to normal for several hours HYPERGLYCEMIA is seen sporadically overnight, early afternoon and once around dinnertime  CGM use % of time   2-week average/GV 220/44  Time in range      28'$   %  % Time Above 180 23  % Time above 250 42  % Time Below 70 7     PRE-MEAL Fasting Lunch Dinner Bedtime Overall  Glucose range:       Averages: 198 2 29 213  220   POST-MEAL PC Breakfast PC Lunch PC Dinner  Glucose range:     Averages: 252 ? 51       CGM use % of time 87  2-week average/GV 135/52  Time in range 58  % Time Above 180 16+8  % Time above 250   % Time Below 70 18      Symptoms of hypoglycemia: Weakness, shakiness        Self-care: The diet that the patient has been following is: Relatively low fat and carbohydrate May have sugar-free cookies at bedtime Breakfast may be oatmeal with almond milk or eggs and sausage         Exercise: No consistent activity.          Most recent dietitian/nurse educator  visit :    04/2022       Diabetes labs:  Lab Results  Component Value Date   HGBA1C 10.1 (H) 08/12/2022   HGBA1C 9.5 (H) 05/20/2022   HGBA1C 11.7 (A) 04/19/2022   Lab Results  Component Value Date   MICROALBUR <0.7 05/20/2022   LDLCALC 88 05/20/2022   CREATININE 1.48 08/12/2022    Lab on 08/12/2022  Component Date Value Ref Range Status   Hgb A1c MFr Bld 08/12/2022 10.1 (H)  4.8 - 5.6 % Final   Comment:          Prediabetes: 5.7 - 6.4          Diabetes: >6.4          Glycemic control for adults with diabetes: <7.0    Est. average glucose Bld gHb Est-m* 08/12/2022 243  mg/dL Final  Lab on 08/12/2022  Component Date Value Ref Range Status   Sodium 08/12/2022 140  135 - 145 mEq/L Final   Potassium 08/12/2022 3.4 (L)  3.5 - 5.1 mEq/L Final   Chloride 08/12/2022 106  96  - 112 mEq/L Final   CO2 08/12/2022 27  19 - 32 mEq/L Final   Glucose, Bld 08/12/2022 142 (H)  70 - 99 mg/dL Final   BUN 08/12/2022 15  6 - 23 mg/dL Final   Creatinine, Ser 08/12/2022 1.48  0.40 - 1.50 mg/dL Final   GFR 08/12/2022 46.12 (L)  >60.00 mL/min Final   Calculated using the CKD-EPI Creatinine Equation (2021)   Calcium 08/12/2022 9.3  8.4 - 10.5 mg/dL Final    Allergies as of 08/14/2022   No Known Allergies      Medication List        Accurate as of August 14, 2022  9:42 AM. If you have any questions, ask your nurse or doctor.          Alphagan P 0.1 % Soln Generic drug: brimonidine Place 1 drop into both eyes daily.   atorvastatin 80 MG tablet Commonly known as: LIPITOR Take 1 tablet (80 mg total) by mouth daily.   B-D SINGLE USE SWABS REGULAR Pads USE TO CHECK BLOOD SUGAR 2 TIMES PER DAY   BD Veo Insulin Syringe U/F 31G X 15/64" 1 ML Misc Generic drug: Insulin Syringe-Needle U-100 USE  SYRINGE TWICE DAILY   dorzolamide-timolol 22.3-6.8 MG/ML ophthalmic solution Commonly known as: COSOPT Place 1 drop into both eyes 2 (two) times daily.   FreeStyle Libre 2 Reader [place] 1 each by Does not apply route daily.   FreeStyle Libre 2 Sensor Misc 1 Device by Does not apply route every 14 (fourteen) days.   furosemide 20 MG tablet Commonly known as: LASIX Take 1 tablet (20 mg total) by mouth daily.   glucagon 1 MG injection Inject 1 mg into the muscle once as needed.   Lantus SoloStar 100 UNIT/ML Solostar Pen Generic drug: insulin glargine Inject 50 Units into the skin daily.   lisinopril 5 MG tablet Commonly known as: ZESTRIL Take 1 tablet (5 mg total) by mouth daily.   multivitamin tablet Take 1 tablet by mouth daily.   NovoLOG FlexPen 100 UNIT/ML FlexPen Generic drug: insulin aspart Inject 5-7 Units into the skin 3 (three) times daily with meals.   OneTouch Verio test strip Generic drug: glucose blood 1 each by Other route 2 (two) times daily.  And lancets 2/day        Allergies: No Known Allergies  Past Medical History:  Diagnosis Date  Diabetes mellitus type II    GERD (gastroesophageal reflux disease)    Glaucoma    Hyperlipidemia    Hypertension     Past Surgical History:  Procedure Laterality Date   COLONOSCOPY     HERNIA REPAIR     PATELLA FRACTURE SURGERY  1993    Family History  Problem Relation Age of Onset   Alcohol abuse Father    Diabetes Sister    Colon cancer Neg Hx    Esophageal cancer Neg Hx    Stomach cancer Neg Hx    Rectal cancer Neg Hx     Social History:  reports that he has never smoked. He has never used smokeless tobacco. He reports that he does not drink alcohol and does not use drugs.    Review of Systems:   Blood pressure: Usually controlled, only on 5 mg lisinopril  BP Readings from Last 3 Encounters:  08/14/22 126/82  05/23/22 (!) 144/84  04/19/22 106/64     Lipids: He has been treated with 80 mg Lipitor prescribed by other physicians  Lab Results  Component Value Date   CHOL 153 05/20/2022   HDL 53.80 05/20/2022   LDLCALC 88 05/20/2022   LDLDIRECT 135.3 06/17/2013   TRIG 57.0 05/20/2022   CHOLHDL 3 05/20/2022     Diabetes complications: None, no microalbuminuria Last eye exam 2/23 Last foot exam 10/22    Physical Examination:  BP 126/82 (BP Location: Left Arm, Patient Position: Sitting, Cuff Size: Normal)   Pulse (!) 52   Ht '6\' 3"'$  (1.905 m)   Wt 202 lb 12.8 oz (92 kg)   SpO2 96%   BMI 25.35 kg/m         ASSESSMENT/PLAN   Diabetes type 1 with persistently poor control   A1c consistently over 8% and now 10.1  Currently on twice a day Lantus and mealtime NovoLog  Problems identified: Inadequate time in range with 28 % of sugars in target and 7% 70  Extreme variability in blood sugars from day-to-day  He does appear to need higher amounts of basal but the blood sugar patterns are very consistent  Likely not covering all meals with NovoLog  and also taking NovoLog at times when he is not eating May have some hypoglycemia unawareness Needs considerable more diabetes education    Recommendations:  LANTUS insulin: Take 34 units at breakfast and 24 at dinnertime We will need to adjust the evening dose further if morning readings are consistently high Previously instruction had been given NOVOLOG: Take 8 units for oatmeal, 6 for higher protein meals Also at lunch and dinner take 8 to 10 units based on meal size If blood sugars are still high after meals go up 2 units for diet particular meal Showed him how bolus insulin works at mealtimes with course of action We will switch NovoLog to FlexPen so he can take it with him when he is eating out Discussed using closed-loop insulin pump system for more optimal treatment and reduction in hypoglycemia and blood sugar variability as well as overnight stability Given him options of all 3 available pumps and he will look into this further and discuss with diabetes educator on a separate visit Also consider starting carbohydrate counting Probably need to avoid SGLT2 drugs because of history of ketoacidosis Switch to [place] when Lantus finished and he will let us know when this is needed  Total visit time including counseling = 30 minutes   Patient Instructions  Lantus 34 in am  and keep 24 in pm  Novolog 8 units in am, 8-10 at lunch and dinner  Skip the Novolog if no meal                      Elayne Snare 08/14/2022, 9:42 AM

## 2022-08-16 ENCOUNTER — Encounter: Payer: Self-pay | Admitting: Endocrinology

## 2022-08-20 MED ORDER — POTASSIUM CHLORIDE ER 10 MEQ PO TBCR: 10.0000 meq | EXTENDED_RELEASE_TABLET | Freq: Every day | ORAL | 2 refills | Status: DC

## 2022-09-11 ENCOUNTER — Telehealth: Payer: Self-pay | Admitting: Endocrinology

## 2022-09-11 NOTE — Telephone Encounter (Signed)
Patient came by office saying that he does not think his medication is working--his meter is only reading "HI" and he wants to know what he should do.

## 2022-09-11 NOTE — Telephone Encounter (Signed)
Patient was called and his blood sugars are only high today He will be taking 30 units of Lantus in the evening instead of 24 Take 16-20 NovoLog with each meal until blood sugars are improved He will try to find a regular fingerstick meter and check with this also

## 2022-09-11 NOTE — Telephone Encounter (Signed)
Pt contacted and confirmed he is taking Lantus 34 units in the morning and 24 units in the evening. And Novolog 8 units before each meal. His Libre reader is reading HI and wanted to know what adjustments he should make to bring it down.

## 2022-09-13 ENCOUNTER — Telehealth: Payer: Self-pay

## 2022-09-13 NOTE — Telephone Encounter (Signed)
Wife called in to confirm medication patient should be taken. You sent in Rx for Toujeo to start when he finishes Lantus and patient has picked up Rx, but your Notes say for patient to switch to [place] when he finishes Lantus. Please clarify.

## 2022-09-16 NOTE — Telephone Encounter (Signed)
Called wife about patient and she will call back when she is able to write information down.

## 2022-09-16 NOTE — Telephone Encounter (Signed)
Patient and spouse have been notified. He will do additional 4 units which is total of 38 units in am

## 2022-09-16 NOTE — Telephone Encounter (Signed)
Please verify morning and afternoon or just morning? Also, hasn't started toujeo yet because he is finishing lantus.

## 2022-09-22 ENCOUNTER — Other Ambulatory Visit: Payer: Self-pay | Admitting: Endocrinology

## 2022-09-22 ENCOUNTER — Other Ambulatory Visit: Payer: Self-pay | Admitting: Internal Medicine

## 2022-09-22 DIAGNOSIS — Z0000 Encounter for general adult medical examination without abnormal findings: Secondary | ICD-10-CM

## 2022-09-23 ENCOUNTER — Telehealth: Payer: Self-pay

## 2022-09-23 NOTE — Telephone Encounter (Signed)
Patient is requesting refill for furosemide. Per notes back in June you wanted him to take until ankle swelling went down and then stop. Is it ok to refill?

## 2022-09-23 NOTE — Telephone Encounter (Signed)
Wife left voicemail calling me back. I called back and spoke to patient. He is not having swelling anymore and doesn't need medication.

## 2022-10-02 ENCOUNTER — Other Ambulatory Visit (HOSPITAL_BASED_OUTPATIENT_CLINIC_OR_DEPARTMENT_OTHER): Payer: Self-pay

## 2022-10-02 MED ORDER — COMIRNATY 30 MCG/0.3ML IM SUSY
PREFILLED_SYRINGE | INTRAMUSCULAR | 0 refills | Status: DC
  Filled 2022-10-02: qty 0.3, 1d supply, fill #0

## 2022-10-04 ENCOUNTER — Other Ambulatory Visit (INDEPENDENT_AMBULATORY_CARE_PROVIDER_SITE_OTHER): Payer: Medicare HMO

## 2022-10-04 DIAGNOSIS — E1065 Type 1 diabetes mellitus with hyperglycemia: Secondary | ICD-10-CM | POA: Diagnosis not present

## 2022-10-04 LAB — BASIC METABOLIC PANEL WITH GFR
BUN: 17 mg/dL (ref 6–23)
CO2: 25 meq/L (ref 19–32)
Calcium: 9.3 mg/dL (ref 8.4–10.5)
Chloride: 106 meq/L (ref 96–112)
Creatinine, Ser: 1.45 mg/dL (ref 0.40–1.50)
GFR: 47.22 mL/min — ABNORMAL LOW
Glucose, Bld: 271 mg/dL — ABNORMAL HIGH (ref 70–99)
Potassium: 4.2 meq/L (ref 3.5–5.1)
Sodium: 138 meq/L (ref 135–145)

## 2022-10-05 LAB — FRUCTOSAMINE: Fructosamine: 427 umol/L — ABNORMAL HIGH (ref 0–285)

## 2022-10-06 ENCOUNTER — Encounter (HOSPITAL_BASED_OUTPATIENT_CLINIC_OR_DEPARTMENT_OTHER): Payer: Self-pay | Admitting: Emergency Medicine

## 2022-10-06 ENCOUNTER — Other Ambulatory Visit: Payer: Self-pay

## 2022-10-06 ENCOUNTER — Encounter (HOSPITAL_COMMUNITY): Payer: Self-pay

## 2022-10-06 ENCOUNTER — Emergency Department (HOSPITAL_BASED_OUTPATIENT_CLINIC_OR_DEPARTMENT_OTHER): Payer: Medicare HMO | Admitting: Radiology

## 2022-10-06 ENCOUNTER — Observation Stay (HOSPITAL_BASED_OUTPATIENT_CLINIC_OR_DEPARTMENT_OTHER)
Admission: EM | Admit: 2022-10-06 | Discharge: 2022-10-07 | Disposition: A | Discharge status: Home / Self Care | Payer: Medicare HMO | Attending: Internal Medicine | Admitting: Internal Medicine

## 2022-10-06 DIAGNOSIS — E785 Hyperlipidemia, unspecified: Secondary | ICD-10-CM | POA: Diagnosis not present

## 2022-10-06 DIAGNOSIS — R0602 Shortness of breath: Secondary | ICD-10-CM | POA: Diagnosis not present

## 2022-10-06 DIAGNOSIS — R7989 Other specified abnormal findings of blood chemistry: Secondary | ICD-10-CM | POA: Diagnosis not present

## 2022-10-06 DIAGNOSIS — I10 Essential (primary) hypertension: Secondary | ICD-10-CM | POA: Diagnosis not present

## 2022-10-06 DIAGNOSIS — E1122 Type 2 diabetes mellitus with diabetic chronic kidney disease: Secondary | ICD-10-CM | POA: Diagnosis present

## 2022-10-06 DIAGNOSIS — Z7985 Long-term (current) use of injectable non-insulin antidiabetic drugs: Secondary | ICD-10-CM | POA: Diagnosis not present

## 2022-10-06 DIAGNOSIS — R778 Other specified abnormalities of plasma proteins: Secondary | ICD-10-CM | POA: Diagnosis not present

## 2022-10-06 DIAGNOSIS — N1831 Chronic kidney disease, stage 3a: Secondary | ICD-10-CM | POA: Diagnosis not present

## 2022-10-06 DIAGNOSIS — I129 Hypertensive chronic kidney disease with stage 1 through stage 4 chronic kidney disease, or unspecified chronic kidney disease: Secondary | ICD-10-CM | POA: Insufficient documentation

## 2022-10-06 DIAGNOSIS — R079 Chest pain, unspecified: Principal | ICD-10-CM | POA: Diagnosis present

## 2022-10-06 DIAGNOSIS — Z79899 Other long term (current) drug therapy: Secondary | ICD-10-CM | POA: Insufficient documentation

## 2022-10-06 DIAGNOSIS — R0789 Other chest pain: Secondary | ICD-10-CM | POA: Diagnosis not present

## 2022-10-06 DIAGNOSIS — Z794 Long term (current) use of insulin: Secondary | ICD-10-CM | POA: Insufficient documentation

## 2022-10-06 LAB — CBC
HCT: 40.2 % (ref 39.0–52.0)
Hemoglobin: 14.1 g/dL (ref 13.0–17.0)
MCH: 26.6 pg (ref 26.0–34.0)
MCHC: 35.1 g/dL (ref 30.0–36.0)
MCV: 75.8 fL — ABNORMAL LOW (ref 80.0–100.0)
Platelets: 224 10*3/uL (ref 150–400)
RBC: 5.3 MIL/uL (ref 4.22–5.81)
RDW: 14.4 % (ref 11.5–15.5)
WBC: 6.2 10*3/uL (ref 4.0–10.5)
nRBC: 0 % (ref 0.0–0.2)

## 2022-10-06 LAB — BASIC METABOLIC PANEL
Anion gap: 10 (ref 5–15)
BUN: 16 mg/dL (ref 8–23)
CO2: 22 mmol/L (ref 22–32)
Calcium: 9.4 mg/dL (ref 8.9–10.3)
Chloride: 106 mmol/L (ref 98–111)
Creatinine, Ser: 1.28 mg/dL — ABNORMAL HIGH (ref 0.61–1.24)
GFR, Estimated: 58 mL/min — ABNORMAL LOW (ref >60)
Glucose, Bld: 278 mg/dL — ABNORMAL HIGH (ref 70–99)
Potassium: 3.8 mmol/L (ref 3.5–5.1)
Sodium: 138 mmol/L (ref 135–145)

## 2022-10-06 LAB — TROPONIN I (HIGH SENSITIVITY)
Troponin I (High Sensitivity): 21 ng/L — ABNORMAL HIGH (ref <18)
Troponin I (High Sensitivity): 21 ng/L — ABNORMAL HIGH (ref <18)

## 2022-10-06 LAB — CBG MONITORING, ED
Glucose-Capillary: 299 mg/dL — ABNORMAL HIGH (ref 70–99)
Glucose-Capillary: 284 mg/dL — ABNORMAL HIGH (ref 70–99)
Glucose-Capillary: 370 mg/dL — ABNORMAL HIGH (ref 70–99)

## 2022-10-06 LAB — GLUCOSE, CAPILLARY: Glucose-Capillary: 325 mg/dL — ABNORMAL HIGH (ref 70–99)

## 2022-10-06 MED ORDER — ALUM & MAG HYDROXIDE-SIMETH 200-200-20 MG/5ML PO SUSP: 15.0000 mL | Freq: Four times a day (QID) | ORAL | Status: DC | PRN

## 2022-10-06 MED ORDER — INFLUENZA VAC A&B SA ADJ QUAD 0.5 ML IM PRSY
0.5000 mL | PREFILLED_SYRINGE | INTRAMUSCULAR | Status: DC
  Filled 2022-10-06: qty 0.5

## 2022-10-06 MED ORDER — DORZOLAMIDE HCL-TIMOLOL MAL 2-0.5 % OP SOLN
1.0000 [drp] | Freq: Two times a day (BID) | OPHTHALMIC | Status: DC
  Administered 2022-10-06 – 2022-10-07 (×2): 1 [drp] via OPHTHALMIC
  Filled 2022-10-06 (×2): qty 10

## 2022-10-06 MED ORDER — INSULIN GLARGINE-YFGN 100 UNIT/ML ~~LOC~~ SOLN: 20.0000 [IU] | Freq: Every evening | SUBCUTANEOUS | Status: DC

## 2022-10-06 MED ORDER — HYDRALAZINE HCL 25 MG PO TABS: 25.0000 mg | ORAL_TABLET | Freq: Four times a day (QID) | ORAL | Status: DC | PRN

## 2022-10-06 MED ORDER — ATORVASTATIN CALCIUM 80 MG PO TABS
80.0000 mg | ORAL_TABLET | Freq: Every day | ORAL | Status: DC
  Administered 2022-10-06 – 2022-10-07 (×2): 80 mg via ORAL
  Filled 2022-10-06: qty 2
  Filled 2022-10-06: qty 1

## 2022-10-06 MED ORDER — BRIMONIDINE TARTRATE 0.15 % OP SOLN
1.0000 [drp] | Freq: Two times a day (BID) | OPHTHALMIC | Status: DC
  Administered 2022-10-06 – 2022-10-07 (×2): 1 [drp] via OPHTHALMIC
  Filled 2022-10-06: qty 5

## 2022-10-06 MED ORDER — NITROGLYCERIN 0.4 MG SL SUBL: 0.4000 mg | SUBLINGUAL_TABLET | SUBLINGUAL | Status: DC | PRN

## 2022-10-06 MED ORDER — ACETAMINOPHEN 325 MG PO TABS: 650.0000 mg | ORAL_TABLET | ORAL | Status: DC | PRN

## 2022-10-06 MED ORDER — INSULIN GLARGINE-YFGN 100 UNIT/ML ~~LOC~~ SOLN: 34.0000 [IU] | Freq: Every morning | SUBCUTANEOUS | Status: DC

## 2022-10-06 MED ORDER — INSULIN ASPART 100 UNIT/ML IJ SOLN
0.0000 [IU] | Freq: Every day | INTRAMUSCULAR | Status: DC
  Administered 2022-10-06: 4 [IU] via SUBCUTANEOUS

## 2022-10-06 MED ORDER — INSULIN ASPART 100 UNIT/ML IJ SOLN
8.0000 [IU] | Freq: Three times a day (TID) | INTRAMUSCULAR | Status: DC
  Administered 2022-10-06 – 2022-10-07 (×2): 8 [IU] via SUBCUTANEOUS

## 2022-10-06 MED ORDER — ORAL CARE MOUTH RINSE: 15.0000 mL | OROMUCOSAL | Status: DC | PRN

## 2022-10-06 MED ORDER — INSULIN GLARGINE-YFGN 100 UNIT/ML ~~LOC~~ SOLN
20.0000 [IU] | Freq: Every morning | SUBCUTANEOUS | Status: DC
  Administered 2022-10-07: 20 [IU] via SUBCUTANEOUS
  Filled 2022-10-06: qty 0.2

## 2022-10-06 MED ORDER — INSULIN GLARGINE-YFGN 100 UNIT/ML ~~LOC~~ SOLN
20.0000 [IU] | Freq: Every day | SUBCUTANEOUS | Status: DC
  Administered 2022-10-06: 20 [IU] via SUBCUTANEOUS
  Filled 2022-10-06 (×3): qty 0.2

## 2022-10-06 MED ORDER — INSULIN ASPART 100 UNIT/ML IJ SOLN
0.0000 [IU] | Freq: Three times a day (TID) | INTRAMUSCULAR | Status: DC
  Administered 2022-10-07: 2 [IU] via SUBCUTANEOUS

## 2022-10-06 MED ORDER — OXYCODONE HCL 5 MG PO TABS: 5.0000 mg | ORAL_TABLET | ORAL | Status: DC | PRN

## 2022-10-06 MED ORDER — ASPIRIN 81 MG PO CHEW
324.0000 mg | CHEWABLE_TABLET | Freq: Once | ORAL | Status: AC
  Administered 2022-10-06: 324 mg via ORAL
  Filled 2022-10-06: qty 4

## 2022-10-06 MED ORDER — LISINOPRIL 5 MG PO TABS
5.0000 mg | ORAL_TABLET | Freq: Every day | ORAL | Status: DC
  Administered 2022-10-06 – 2022-10-07 (×2): 5 mg via ORAL
  Filled 2022-10-06 (×2): qty 1

## 2022-10-06 MED ORDER — ENOXAPARIN SODIUM 40 MG/0.4ML IJ SOSY
40.0000 mg | PREFILLED_SYRINGE | INTRAMUSCULAR | Status: DC
  Administered 2022-10-06: 40 mg via SUBCUTANEOUS
  Filled 2022-10-06: qty 0.4

## 2022-10-06 MED ORDER — POTASSIUM CHLORIDE ER 10 MEQ PO TBCR
10.0000 meq | EXTENDED_RELEASE_TABLET | Freq: Every day | ORAL | Status: DC
  Administered 2022-10-06 – 2022-10-07 (×2): 10 meq via ORAL
  Filled 2022-10-06 (×5): qty 1

## 2022-10-06 MED ORDER — ONDANSETRON HCL 4 MG/2ML IJ SOLN: 4.0000 mg | Freq: Four times a day (QID) | INTRAMUSCULAR | Status: DC | PRN

## 2022-10-06 MED ORDER — INSULIN GLARGINE-YFGN 100 UNIT/ML ~~LOC~~ SOLN
24.0000 [IU] | Freq: Every evening | SUBCUTANEOUS | Status: DC
  Administered 2022-10-06: 24 [IU] via SUBCUTANEOUS
  Filled 2022-10-06: qty 240

## 2022-10-06 NOTE — H&P (Signed)
History and Physical    Christopher [name] TFT:[id number] DOB: 06-21-1946 DOA: 10/06/2022  PCP: Isaac Bliss, Rayford Halsted, MD   Patient coming from: Home   Chief Complaint: Felt like something wasn't right    HPI: Christopher [name] is a pleasant 76 y.o. male with medical history significant for hypertension, hyperlipidemia, CKD 3A, and poorly controlled insulin-dependent diabetes mellitus who presents to the emergency department for evaluation of general sense of unwellness.  Patient reported waking up 4 in the morning with a general sense that something was not right.  He has difficulty describing the symptoms any further, does not remember having any chest pain or left arm discomfort, but his wife at the bedside notes that the patient said he was having chest discomfort this morning.  He checked his blood glucose and found it to be in the 30s.  He had taken his insulin last night but states that it was getting late so he went to bed without dinner.  He ate a muffin and headed to the emergency department.  He began to feel better and returned to his usual state without any other intervention.     [facility] ED Course: Upon arrival to the ED, patient is found to be afebrile and saturating well on room air with elevated blood pressures.  EKG features sinus rhythm and chest x-ray is negative for acute cardiopulmonary disease.  Notable for nodular opacity overlying the left lower lung.  Blood work notable for glucose of 278, creatinine 1.28, and troponin of 21.  ED physician discussed the case with cardiology who recommended giving aspirin and repeating troponin.  Second troponin was unchanged at 21.  Patient was treated with 324 mg of aspirin, Lipitor, and transferred to [facility] for admission.  Review of Systems:  All other systems reviewed and apart from HPI, are negative.  Past Medical History:  Diagnosis Date   Diabetes mellitus type II    GERD (gastroesophageal reflux  disease)    Glaucoma    Hyperlipidemia    Hypertension     Past Surgical History:  Procedure Laterality Date   COLONOSCOPY     HERNIA REPAIR     PATELLA FRACTURE SURGERY  1993    Social History:   reports that he has never smoked. He has never used smokeless tobacco. He reports that he does not drink alcohol and does not use drugs.  No Known Allergies  Family History  Problem Relation Age of Onset   Alcohol abuse Father    Diabetes Sister    Colon cancer Neg Hx    Esophageal cancer Neg Hx    Stomach cancer Neg Hx    Rectal cancer Neg Hx      Prior to Admission medications   Medication Sig Start Date End Date Taking? Authorizing Provider  ALPHAGAN P 0.1 % SOLN Place 1 drop into both eyes daily.  03/03/11  Yes [provider]  atorvastatin (LIPITOR) 80 MG tablet Take 1 tablet (80 mg total) by mouth daily. 03/25/22  Yes Little Ishikawa, MD  dorzolamide-timolol (COSOPT) 22.3-6.8 MG/ML ophthalmic solution Place 1 drop into both eyes 2 (two) times daily.  03/03/11  Yes [provider]  insulin aspart (NOVOLOG FLEXPEN) 100 UNIT/ML FlexPen Inject 8-10 Units into the skin 3 (three) times daily with meals. 08/14/22  Yes Elayne Snare, MD  insulin glargine (LANTUS) 100 UNIT/ML injection Inject 34 Units into the skin daily. am   Yes [provider]  insulin glargine (LANTUS)  100 UNIT/ML injection Inject 24 Units into the skin daily. evening   Yes [provider]  lisinopril (ZESTRIL) 5 MG tablet Take 1 tablet (5 mg total) by mouth daily. 05/15/22  Yes Isaac Bliss, Rayford Halsted, MD  Multiple Vitamin (MULTIVITAMIN) tablet Take 1 tablet by mouth daily.   Yes [provider]  potassium chloride (KLOR-CON) 10 MEQ tablet Take 1 tablet (10 mEq total) by mouth daily. 10 mEq daily 08/20/22  Yes Elayne Snare, MD  Alcohol Swabs (B-D SINGLE USE SWABS REGULAR) PADS USE TO CHECK BLOOD SUGAR 2 TIMES PER DAY 01/07/18   Renato Shin, MD  Continuous Blood Gluc  Receiver (FREESTYLE LIBRE 2 READER) DEVI 1 each by Does not apply route daily. 04/19/22   Renato Shin, MD  Continuous Blood Gluc Sensor (FREESTYLE LIBRE 2 SENSOR) MISC 1 Device by Does not apply route every 14 (fourteen) days. 04/03/22   Isaac Bliss, Rayford Halsted, MD  COVID-19 mRNA vaccine [phone] (COMIRNATY) syringe Inject into the muscle. 10/02/22   Carlyle Basques, MD  furosemide (LASIX) 20 MG tablet Take 1 tablet (20 mg total) by mouth daily. 05/23/22   Elayne Snare, MD  glucagon 1 MG injection Inject 1 mg into the muscle once as needed. 03/12/16   Renato Shin, MD  glucose blood [facility] VERIO) test strip 1 each by Other route 2 (two) times daily. And lancets 2/day 04/19/22   Renato Shin, MD  insulin glargine, 1 Unit Dial, (TOUJEO SOLOSTAR) 300 UNIT/ML Solostar Pen 34 units in the a.m. and 24 units in the evening 08/14/22   Elayne Snare, MD  Insulin Syringe-Needle U-100 (BD VEO INSULIN SYRINGE U/F) 31G X 15/64" 1 ML MISC USE  SYRINGE TWICE DAILY 07/29/22   Elayne Snare, MD    Physical Exam: Vitals:   10/06/22 1500 10/06/22 1600 10/06/22 1810 10/06/22 1953  BP: (!) 152/88 (!) 154/93 (!) 161/99 (!) 170/94  Pulse: 67 71 77 69  Resp: 19 (!) '21 16 19  '$ Temp:   98 F (36.7 C) 97.7 F (36.5 C)  TempSrc:   Oral Oral  SpO2: 100% 100% 100% 100%  Weight:    91.4 kg  Height:    '6\' 2"'$  (1.88 m)    Constitutional: NAD, calm  Eyes: PERTLA, lids and conjunctivae normal ENMT: Mucous membranes are moist. Posterior pharynx clear of any exudate or lesions.   Neck: supple, no masses  Respiratory: no wheezing, no crackles. No accessory muscle use.  Cardiovascular: S1 & S2 heard, regular rate and rhythm. No extremity edema.   Abdomen: No distension, no tenderness, soft. Bowel sounds active.  Musculoskeletal: no clubbing / cyanosis. No joint deformity upper and lower extremities.   Skin: no significant rashes, lesions, ulcers. Warm, dry, well-perfused. Neurologic: CN 2-12 grossly intact. Moving all  extremities. Alert and oriented.  Psychiatric: Pleasant. Cooperative.    Labs and Imaging on Admission: I have personally reviewed following labs and imaging studies  CBC: Recent Labs  Lab 10/06/22 0757  WBC 6.2  HGB 14.1  HCT 40.2  MCV 75.8*  PLT 419   Basic Metabolic Panel: Recent Labs  Lab 10/04/22 0941 10/06/22 0757  NA 138 138  K 4.2 3.8  CL 106 106  CO2 25 22  GLUCOSE 271* 278*  BUN 17 16  CREATININE 1.45 1.28*  CALCIUM 9.3 9.4   GFR: Estimated Creatinine Clearance: 58 mL/min (A) (by C-G formula based on SCr of 1.28 mg/dL (H)). Liver Function Tests: No results for input(s): "AST", "ALT", "ALKPHOS", "BILITOT", "PROT", "ALBUMIN"  in the last 168 hours. No results for input(s): "LIPASE", "AMYLASE" in the last 168 hours. No results for input(s): "AMMONIA" in the last 168 hours. Coagulation Profile: No results for input(s): "INR", "PROTIME" in the last 168 hours. Cardiac Enzymes: No results for input(s): "CKTOTAL", "CKMB", "CKMBINDEX", "TROPONINI" in the last 168 hours. BNP (last 3 results) No results for input(s): "PROBNP" in the last 8760 hours. HbA1C: No results for input(s): "HGBA1C" in the last 72 hours. CBG: Recent Labs  Lab 10/06/22 0830 10/06/22 1445 10/06/22 1800  GLUCAP 299* 284* 370*   Lipid Profile: No results for input(s): "CHOL", "HDL", "LDLCALC", "TRIG", "CHOLHDL", "LDLDIRECT" in the last 72 hours. Thyroid Function Tests: No results for input(s): "TSH", "T4TOTAL", "FREET4", "T3FREE", "THYROIDAB" in the last 72 hours. Anemia Panel: No results for input(s): "VITAMINB12", "FOLATE", "FERRITIN", "TIBC", "IRON", "RETICCTPCT" in the last 72 hours. Urine analysis:    Component Value Date/Time   COLORURINE YELLOW 04/13/2022 1736   APPEARANCEUR CLEAR 04/13/2022 1736   LABSPEC 1.032 (H) 04/13/2022 1736   PHURINE 5.0 04/13/2022 1736   GLUCOSEU >=500 (A) 04/13/2022 1736   GLUCOSEU 500 08/25/2013 1450   HGBUR NEGATIVE 04/13/2022 1736   HGBUR  negative 02/06/2009 1156   BILIRUBINUR NEGATIVE 04/13/2022 1736   BILIRUBINUR N 03/25/2017 1053   KETONESUR NEGATIVE 04/13/2022 1736   PROTEINUR NEGATIVE 04/13/2022 1736   UROBILINOGEN 0.2 03/25/2017 1053   UROBILINOGEN 0.2 08/25/2013 1450   NITRITE NEGATIVE 04/13/2022 1736   LEUKOCYTESUR NEGATIVE 04/13/2022 1736   Sepsis Labs: '@LABRCNTIP'$ (procalcitonin:4,lacticidven:4) )No results found for this or any previous visit (from the past 240 hour(s)).   Radiological Exams on Admission: DG Chest 2 View  Result Date: 10/06/2022 CLINICAL DATA:  Acute chest pain and shortness of breath today. EXAM: CHEST - 2 VIEW COMPARISON:  03/23/2022 and prior radiographs FINDINGS: The cardiomediastinal silhouette is unremarkable. An 8 mm nodular opacity overlying the LOWER LEFT lung is indeterminate but may represent nipple. There is no evidence of focal airspace disease, pulmonary edema, pulmonary mass, pleural effusion, or pneumothorax. No acute bony abnormalities are identified. IMPRESSION: 1. 8 mm nodular opacity overlying the LOWER LEFT lung is indeterminate but may represent nipple. Recommend repeat chest radiographs with nipple markers. 2. No evidence of acute cardiopulmonary disease. Electronically Signed   By: Margarette Canada M.D.   On: 10/06/2022 08:33    EKG: Independently reviewed. Sinus rhythm.   Assessment/Plan   1. Chest pain  - Pt does not remember any chest discomfort but his wife reports that he mentioned chest pain this am   - He was treated with ASA 324 mg in ED  - HS troponin is slightly elevated and unchanged 2 hrs later - No acute ischemic features on EKG  - No acute CXR findings  - Suspect his symptoms were related to hypoglycemia as ED workup is reassuring and he returned to his usual state after eating, will continue cardiac monitoring overnight   2. Hypertension  - BP elevated on admission  - Continue lisinopril, may need second agent    3. Insulin-dependent DM  - A1c was 10.1%  in August 2023  - CBG was 30s this am after not eating dinner last night - Continue CBG checks and insulin   4. HLD  - Continue Lipitor   5. CKD IIIa  - SCr is 1.28 on admission, appears to be his baseline  - Renally-dose medications, monitor    DVT prophylaxis: Lovenox  Code Status: Full  Level of Care: Level of care: Telemetry  Cardiac Family Communication: Wife at bedside   Disposition Plan:  Patient is from: home  Anticipated d/c is to: Home  Anticipated d/c date is: 10/07/22 Patient currently: Pending cardiac monitoring  Consults called: None  Admission status: Observation     Vianne Bulls, MD [facility]  10/06/2022, 8:24 PM

## 2022-10-06 NOTE — ED Triage Notes (Signed)
Pt arrives to ED with c/o chest pain. He reports he woke up at 4am this morning with left sided CP. The pain is constant and described as sharp. No N/V/D, diaphoresis, dizziness.

## 2022-10-06 NOTE — ED Notes (Signed)
He has eaten and has received his insulin. His wife remains at bedside.

## 2022-10-06 NOTE — Progress Notes (Signed)
Plan of Care Note for accepted transfer   Patient: Christopher [name] MRN: [id number]   [place]: 10/06/2022  Facility requesting transfer: [facility] Requesting Provider: Alvino Chapel Reason for transfer: Chest pain  Facility course: Patient with h/o HTN, HLD, and DM presenting with chest pain.  L hand was tingling with chest pain (secondarily).  HS troponin 21.  Glucose mildly low.  Cardiology recommends ASA, monitoring, delta troponin.  If troponin increasing will need heparin.  Will observe overnight.  Note: if troponin is stable and patient remains chest pain-free, short-term outpatient cardiology f/u would also be a consideration.   Plan of care: The patient is accepted for admission to Telemetry unit, at [facility].   Author: Karmen Bongo, MD 10/06/2022  Check [email or web address] for on-call coverage.  Nursing staff, Please call [place] number on Amion as soon as patient's arrival, so appropriate admitting provider can evaluate the pt.

## 2022-10-06 NOTE — ED Provider Notes (Signed)
[facility] EMERGENCY DEPT Provider Note   CSN: [id number] Arrival date & time: 10/06/22  0749     History  Chief Complaint  Patient presents with   Chest Pain    Christopher [name] is a 76 y.o. male.   Chest Pain Patient presented with left arm feeling strange.  States he woke up at 4 in the morning and had strange feeling in his hand.  States is still there a little bit.  States was having some chest pain at the time also.  Hand felt tingly like he slept on it but has not improved.  States has been moving it.  No nausea vomiting diarrhea.  Has been doing well prior to this.  Has not lost sensation.  No difficulty walking.  No headache.  No confusion.  Patient is diabetic and reportedly sugar was low this morning.    Past Medical History:  Diagnosis Date   Diabetes mellitus type II    GERD (gastroesophageal reflux disease)    Glaucoma    Hyperlipidemia    Hypertension     Home Medications Prior to Admission medications   Medication Sig Start Date End Date Taking? Authorizing Provider  ALPHAGAN P 0.1 % SOLN Place 1 drop into both eyes daily.  03/03/11  Yes [provider]  atorvastatin (LIPITOR) 80 MG tablet Take 1 tablet (80 mg total) by mouth daily. 03/25/22  Yes Little Ishikawa, MD  dorzolamide-timolol (COSOPT) 22.3-6.8 MG/ML ophthalmic solution Place 1 drop into both eyes 2 (two) times daily.  03/03/11  Yes [provider]  insulin aspart (NOVOLOG FLEXPEN) 100 UNIT/ML FlexPen Inject 8-10 Units into the skin 3 (three) times daily with meals. 08/14/22  Yes Elayne Snare, MD  insulin glargine (LANTUS) 100 UNIT/ML injection Inject 34 Units into the skin daily. am   Yes [provider]  insulin glargine (LANTUS) 100 UNIT/ML injection Inject 24 Units into the skin daily. evening   Yes [provider]  lisinopril (ZESTRIL) 5 MG tablet Take 1 tablet (5 mg total) by mouth daily. 05/15/22  Yes Isaac Bliss, Rayford Halsted, MD  Multiple  Vitamin (MULTIVITAMIN) tablet Take 1 tablet by mouth daily.   Yes [provider]  potassium chloride (KLOR-CON) 10 MEQ tablet Take 1 tablet (10 mEq total) by mouth daily. 10 mEq daily 08/20/22  Yes Elayne Snare, MD  Alcohol Swabs (B-D SINGLE USE SWABS REGULAR) PADS USE TO CHECK BLOOD SUGAR 2 TIMES PER DAY 01/07/18   Renato Shin, MD  Continuous Blood Gluc Receiver (FREESTYLE LIBRE 2 READER) DEVI 1 each by Does not apply route daily. 04/19/22   Renato Shin, MD  Continuous Blood Gluc Sensor (FREESTYLE LIBRE 2 SENSOR) MISC 1 Device by Does not apply route every 14 (fourteen) days. 04/03/22   Isaac Bliss, Rayford Halsted, MD  COVID-19 mRNA vaccine [phone] (COMIRNATY) syringe Inject into the muscle. 10/02/22   Carlyle Basques, MD  furosemide (LASIX) 20 MG tablet Take 1 tablet (20 mg total) by mouth daily. 05/23/22   Elayne Snare, MD  glucagon 1 MG injection Inject 1 mg into the muscle once as needed. 03/12/16   Renato Shin, MD  glucose blood [facility] VERIO) test strip 1 each by Other route 2 (two) times daily. And lancets 2/day 04/19/22   Renato Shin, MD  insulin glargine, 1 Unit Dial, (TOUJEO SOLOSTAR) 300 UNIT/ML Solostar Pen 34 units in the a.m. and 24 units in the evening 08/14/22   Elayne Snare, MD  Insulin Syringe-Needle U-100 (BD VEO INSULIN  SYRINGE U/F) 31G X 15/64" 1 ML MISC USE  SYRINGE TWICE DAILY 07/29/22   Elayne Snare, MD      Allergies    Patient has no known allergies.    Review of Systems   Review of Systems  Cardiovascular:  Positive for chest pain.    Physical Exam Updated Vital Signs BP (!) 169/102 (BP Location: Right Arm)   Pulse 71   Temp 98.1 F (36.7 C)   Resp 17   Ht '6\' 2"'$  (1.88 m)   Wt 88.5 kg   SpO2 100%   BMI 25.04 kg/m  Physical Exam Vitals reviewed.  Constitutional:      Appearance: He is well-developed.  Cardiovascular:     Rate and Rhythm: Normal rate and regular rhythm.  Pulmonary:     Breath sounds: No wheezing or rhonchi.  Chest:     Chest  wall: No tenderness.  Abdominal:     Tenderness: There is no abdominal tenderness.  Musculoskeletal:     Cervical back: Neck supple.     Right lower leg: No edema.     Left lower leg: No edema.  Skin:    General: Skin is warm.  Neurological:     Mental Status: He is alert.     Comments: Strength and sensation intact in left hand.  Strong radial pulse.  Normal sensation over radial median and ulnar to suspicion.  Good flexion and extension in the hand wrist and elbow.     ED Results / Procedures / Treatments   Labs (all labs ordered are listed, but only abnormal results are displayed) Labs Reviewed  BASIC METABOLIC PANEL - Abnormal; Notable for the following components:      Result Value   Glucose, Bld 278 (*)    Creatinine, Ser 1.28 (*)    GFR, Estimated 58 (*)    All other components within normal limits  CBC - Abnormal; Notable for the following components:   MCV 75.8 (*)    All other components within normal limits  CBG MONITORING, ED - Abnormal; Notable for the following components:   Glucose-Capillary 299 (*)    All other components within normal limits  CBG MONITORING, ED - Abnormal; Notable for the following components:   Glucose-Capillary 284 (*)    All other components within normal limits  TROPONIN I (HIGH SENSITIVITY) - Abnormal; Notable for the following components:   Troponin I (High Sensitivity) 21 (*)    All other components within normal limits  TROPONIN I (HIGH SENSITIVITY) - Abnormal; Notable for the following components:   Troponin I (High Sensitivity) 21 (*)    All other components within normal limits    EKG EKG Interpretation  Date/Time:  Sunday October 06 2022 07:57:56 EDT Ventricular Rate:  75 PR Interval:  179 QRS Duration: 96 QT Interval:  415 QTC Calculation: 464 R Axis:   -27 Text Interpretation: Sinus rhythm Borderline left axis deviation Confirmed by Davonna Belling [id number]) on 10/06/2022 8:04:13 AM  Radiology DG Chest 2  View  Result Date: 10/06/2022 CLINICAL DATA:  Acute chest pain and shortness of breath today. EXAM: CHEST - 2 VIEW COMPARISON:  03/23/2022 and prior radiographs FINDINGS: The cardiomediastinal silhouette is unremarkable. An 8 mm nodular opacity overlying the LOWER LEFT lung is indeterminate but may represent nipple. There is no evidence of focal airspace disease, pulmonary edema, pulmonary mass, pleural effusion, or pneumothorax. No acute bony abnormalities are identified. IMPRESSION: 1. 8 mm nodular opacity overlying the LOWER LEFT lung is  indeterminate but may represent nipple. Recommend repeat chest radiographs with nipple markers. 2. No evidence of acute cardiopulmonary disease. Electronically Signed   By: Margarette Canada M.D.   On: 10/06/2022 08:33    Procedures Procedures    Medications Ordered in ED Medications  lisinopril (ZESTRIL) tablet 5 mg (has no administration in time range)  potassium chloride (KLOR-CON) CR tablet 10 mEq (has no administration in time range)  atorvastatin (LIPITOR) tablet 80 mg (has no administration in time range)  dorzolamide-timolol (COSOPT) 2-0.5 % ophthalmic solution 1 drop (has no administration in time range)  insulin glargine-yfgn (SEMGLEE) injection 34 Units (has no administration in time range)  insulin glargine-yfgn (SEMGLEE) injection 24 Units (has no administration in time range)  insulin aspart (novoLOG) injection 8 Units (has no administration in time range)  aspirin chewable tablet 324 mg (324 mg Oral Given 10/06/22 1013)    ED Course/ Medical Decision Making/ A&P                           Medical Decision Making Amount and/or Complexity of Data Reviewed Labs: ordered. Radiology: ordered.  Risk OTC drugs. Prescription drug management. Decision regarding hospitalization.   Patient with paresthesias in the left hand.  Woke up with it.  States he felt as if he slept on it.  Reportedly had some chest pain at the time.  No chest pain now.  He  is mildly hypertensive.  EKG reassuring.  Does not show ischemia however with symptoms of left arm we will get some blood work.  Stroke type symptoms felt less likely.  Had just paresthesias and not loss of sensation or strength.  Troponin is mildly elevated at 20.  Had been elevated previously when I reviewed notes from recent admission.  No chest pain at this time.  Discussed with Dr. Curt Bears from cardiology.  Potentially this is not direct cardiac cause but will give aspirin at this time.  If troponin elevates may need more aggressive treatment.  Will admit to medicine for further work-up.  Second troponin reassuring.  Will admit to internal medicine.        Final Clinical Impression(s) / ED Diagnoses Final diagnoses:  Chest pain, unspecified type  Elevated troponin    Rx / DC Orders ED Discharge Orders     None         Davonna Belling, MD 10/06/22 1528

## 2022-10-07 ENCOUNTER — Observation Stay (HOSPITAL_COMMUNITY): Payer: Medicare HMO

## 2022-10-07 DIAGNOSIS — R911 Solitary pulmonary nodule: Secondary | ICD-10-CM | POA: Diagnosis not present

## 2022-10-07 LAB — GLUCOSE, CAPILLARY
Glucose-Capillary: 77 mg/dL (ref 70–99)
Glucose-Capillary: 217 mg/dL — ABNORMAL HIGH (ref 70–99)

## 2022-10-07 MED ORDER — ASPIRIN 81 MG PO CHEW: 81.0000 mg | CHEWABLE_TABLET | Freq: Every day | ORAL | 0 refills | Status: AC

## 2022-10-07 NOTE — TOC Transition Note (Signed)
Transition of Care [facility]) - CM/SW Discharge Note   Patient Details  Name: Christopher [name] MRN: [id number] Date of Birth: 1946/02/03  [facility] CM/SW Contact:  Zenon Mayo, RN Phone Number: 10/07/2022, 2:25 PM   Clinical Narrative:    Patient is for dc today, has no needs.         Patient Goals and CMS Choice        Discharge Placement                       Discharge Plan and Services                                     Social Determinants of Health (SDOH) Interventions     Readmission Risk Interventions     No data to display

## 2022-10-07 NOTE — Progress Notes (Signed)
   10/07/22 1110  Mobility  Activity Ambulated with assistance in hallway  Level of Assistance Standby assist, set-up cues, supervision of patient - no hands on  Assistive Device Front wheel walker  Distance Ambulated (ft) 400 ft  Activity Response Tolerated well  Mobility Referral Yes  $Mobility charge 1 Mobility   Mobility Specialist Progress Note  Received pt in bed having no complaints and agreeable to mobility. X1 standing break d/t fatigue. Returned to bed w/ all needs met and call bell in reach.   Lucious Groves Mobility Specialist

## 2022-10-07 NOTE — Discharge Summary (Signed)
DOCK BACCAM GQB:[id number] DOB: Nov 04, 1946 DOA: 10/06/2022  PCP: Isaac Bliss, Rayford Halsted, MD  Admit date: 10/06/2022  Discharge date: 10/07/2022  Admitted From: Home   disposition: Home   Recommendations for Outpatient Follow-up:   Follow up with PCP in 1-2 weeks Home Health: N/A Equipment/Devices: N/A Consultations: None Discharge Condition: Improved CODE STATUS: Full Diet Recommendation: Heart Healthy carb modified  Diet Order             Diet - low sodium heart healthy           Diet Carb Modified Fluid consistency: Thin; Room service appropriate? Yes  Diet effective now                    Chief Complaint  Patient presents with   Chest Pain     Brief history of present illness from the day of admission and additional interim summary      Christopher [name] is a pleasant 76 y.o. male with medical history significant for hypertension, hyperlipidemia, CKD 3A, and poorly controlled insulin-dependent diabetes mellitus who presents to the emergency department for evaluation of general sense of unwellness.  Patient reported waking up 4 in the morning with a general sense that something was not right.  He has difficulty describing the symptoms any further, does not remember having any chest pain or left arm discomfort, but his wife at the bedside notes that the patient said he was having chest discomfort this morning.  He checked his blood glucose and found it to be in the 30s.  He had taken his insulin last night but states that it was getting late so he went to bed without dinner.  He ate a muffin and headed to the emergency department.  He began to feel better and returned to his usual state without any other intervention.     Upon arrival to the ED, patient is found to be afebrile and saturating  well on room air with elevated blood pressures.  EKG features sinus rhythm and chest x-ray is negative for acute cardiopulmonary disease.  Notable for nodular opacity overlying the left lower lung.  Blood work notable for glucose of 278, creatinine 1.28, and troponin of 21.  ED physician discussed the case with cardiology who recommended giving aspirin and repeating troponin.  Second troponin was unchanged at 21.  Patient was treated with 324 mg of aspirin, Lipitor, and transferred to [facility] for admission.                                                                  Hospital Course   Patient remained chest pain-free and blood sugars remained elevated throughout patient's hospital stay.  Discussed with cardiology who noted a troponin of 21 is certainly consistent with episode  of hypoglycemia.  On further history, patient states that his last episode of chest pain occurred while he was mowing his lawn and had another episode of hypoglycemia while he was mowing his lawn.  Chest pain at that time resolved spontaneously as it did at this time as well.  Discussed that patient has labile blood pressure and he says he is working with an endocrinologist who was trying to get him a pump.  However his insurance will not cover pump so his endocrinologist is appealing that.  Discussed that patient may benefit from exercise stress test as an outpatient, patient was instructed to discuss that with his PCP which he says he will do.   Patient is discharged home on aspirin 81 mg daily with instructions to follow-up with his endocrinologist tomorrow as is already scheduled and to follow-up with his PCP in 1 week to discuss stability of an outpatient stress test.   Discharge diagnosis     Principal Problem:   Chest pain Active Problems:   Hyperlipidemia   Type 2 diabetes mellitus with stage 3a chronic kidney disease ([place])   Hypertension    Discharge instructions    Discharge Instructions      Diet - low sodium heart healthy   Complete by: As directed    Discharge instructions   Complete by: As directed    1. As we discussed, try to avoid very low blood sugars. Discuss the low blood sugars with your diabetes doctor at your appointment tomorrow. Carrying glucose gel might be helpful.  2. Discuss whether you need an outpatient cardiac stress test with your PCP. If you are having chest pain when your are physically active (and your sugar is not very low), you might benefit from a stress test.  3. Start taking a baby aspirin every day.   Increase activity slowly   Complete by: As directed        Discharge Medications   Allergies as of 10/07/2022   No Known Allergies      Medication List     STOP taking these medications    ibuprofen 200 MG tablet Commonly known as: ADVIL       TAKE these medications    aspirin 81 MG chewable tablet Commonly known as: Aspirin Childrens Chew 1 tablet (81 mg total) by mouth daily.   atorvastatin 80 MG tablet Commonly known as: LIPITOR Take 1 tablet (80 mg total) by mouth daily.   BD Veo Insulin Syringe U/F 31G X 15/64" 1 ML Misc Generic drug: Insulin Syringe-Needle U-100 USE  SYRINGE TWICE DAILY   brimonidine 0.15 % ophthalmic solution Commonly known as: ALPHAGAN Place 1 drop into both eyes 2 (two) times daily.   dorzolamide-timolol 2-0.5 % ophthalmic solution Commonly known as: COSOPT Place 1 drop into both eyes 2 (two) times daily.   FreeStyle Libre 2 Reader [place] 1 each by Does not apply route daily.   FreeStyle Libre 2 Sensor Misc 1 Device by Does not apply route every 14 (fourteen) days.   glucagon 1 MG injection Inject 1 mg into the muscle once as needed. What changed:  when to take this reasons to take this   Lantus SoloStar 100 UNIT/ML Solostar Pen Generic drug: insulin glargine Inject 24-34 Units into the skin See admin instructions. 34 units in the morning, 24 units in the evening   lisinopril 5  MG tablet Commonly known as: ZESTRIL Take 1 tablet (5 mg total) by mouth daily.   Multivitamin Adults 50+ Tabs Take  1 tablet by mouth daily.   multivitamin tablet Take 1 tablet by mouth daily.   NovoLOG FlexPen 100 UNIT/ML FlexPen Generic drug: insulin aspart Inject 8-10 Units into the skin 3 (three) times daily with meals. What changed: how much to take   OneTouch Verio test strip Generic drug: glucose blood 1 each by Other route 2 (two) times daily. And lancets 2/day   potassium chloride 10 MEQ tablet Commonly known as: KLOR-CON Take 1 tablet (10 mEq total) by mouth daily. 10 mEq daily What changed: additional instructions   TYLENOL PO Take 1-2 tablets by mouth daily as needed (pain, headache).         Follow-up Information     Isaac Bliss, Rayford Halsted, MD. Go on 10/15/2022.   Specialty: Internal Medicine Why: '@2'$ :00pm Contact information: [place] [address] [phone]                 Major procedures and Radiology Reports - PLEASE review detailed and final reports thoroughly  -      DG Chest 2 View  Result Date: 10/07/2022 CLINICAL DATA:  Pulmonary nodule on previous chest radiography. EXAM: CHEST - 2 VIEW COMPARISON:  10/06/2022.  03/23/2022. FINDINGS: Heart size is normal. Mild aortic atherosclerotic calcification is present. The lungs are clear. Previous finding related to the left nipple shadow. No effusion. No abnormal bone finding. IMPRESSION: No active cardiopulmonary disease. Previous finding related to the left nipple shadow. Electronically Signed   By: Nelson Chimes M.D.   On: 10/07/2022 07:26   DG Chest 2 View  Result Date: 10/06/2022 CLINICAL DATA:  Acute chest pain and shortness of breath today. EXAM: CHEST - 2 VIEW COMPARISON:  03/23/2022 and prior radiographs FINDINGS: The cardiomediastinal silhouette is unremarkable. An 8 mm nodular opacity overlying the LOWER LEFT lung is indeterminate but may represent  nipple. There is no evidence of focal airspace disease, pulmonary edema, pulmonary mass, pleural effusion, or pneumothorax. No acute bony abnormalities are identified. IMPRESSION: 1. 8 mm nodular opacity overlying the LOWER LEFT lung is indeterminate but may represent nipple. Recommend repeat chest radiographs with nipple markers. 2. No evidence of acute cardiopulmonary disease. Electronically Signed   By: Margarette Canada M.D.   On: 10/06/2022 08:33    Micro Results   No results found for this or any previous visit (from the past 240 hour(s)).  Today   Subjective    Farhaan Mabee feels much improved since admission.  Feels ready to go home.  Denies chest pain, shortness of breath or abdominal pain.  Feels they can take care of themselves with the resources they have at home.  Objective   Blood pressure 129/87, pulse 60, temperature 98.6 F (37 C), temperature source Oral, resp. rate 16, height '6\' 2"'$  (1.88 m), weight 91.4 kg, SpO2 99 %.   Intake/Output Summary (Last 24 hours) at 10/07/2022 1444 Last data filed at 10/07/2022 1300 Gross per 24 hour  Intake 717 ml  Output 725 ml  Net -8 ml    Exam General: Patient appears well and in good spirits sitting up in bed in no acute distress.  Eyes: sclera anicteric, conjuctiva mild injection bilaterally CVS: S1-S2, regular  Respiratory:  decreased air entry bilaterally secondary to decreased inspiratory effort, rales at bases  GI: NABS, soft, NT  LE: No edema.  Neuro: A/O x 3, Moving all extremities equally with normal strength, CN 3-12 intact, grossly nonfocal.  Psych: patient is logical and coherent, judgement and insight appear  normal, mood and affect appropriate to situation.    Data Review   CBC w Diff:  Lab Results  Component Value Date   WBC 6.2 10/06/2022   HGB 14.1 10/06/2022   HCT 40.2 10/06/2022   PLT 224 10/06/2022   LYMPHOPCT 6 03/21/2022   MONOPCT 5 03/21/2022   EOSPCT 0 03/21/2022   BASOPCT 0 03/21/2022    CMP:   Lab Results  Component Value Date   NA 138 10/06/2022   K 3.8 10/06/2022   CL 106 10/06/2022   CO2 22 10/06/2022   BUN 16 10/06/2022   CREATININE 1.28 (H) 10/06/2022   PROT 6.3 05/20/2022   ALBUMIN 3.7 05/20/2022   BILITOT 0.8 05/20/2022   ALKPHOS 90 05/20/2022   AST 19 05/20/2022   ALT 18 05/20/2022  .   Total Time in preparing paper work, data evaluation and todays exam - 35 minutes  Vashti Hey M.D on 10/07/2022 at 2:44 PM  [facility]

## 2022-10-07 NOTE — Plan of Care (Signed)
  Problem: Education: Goal: Knowledge of General Education information will improve Description: Including pain rating scale, medication(s)/side effects and non-pharmacologic comfort measures 10/07/2022 1427 by Lurline Idol, RN Outcome: Adequate for Discharge 10/07/2022 1427 by Lurline Idol, RN Outcome: Adequate for Discharge   Problem: Health Behavior/Discharge Planning: Goal: Ability to manage health-related needs will improve 10/07/2022 1427 by Lurline Idol, RN Outcome: Adequate for Discharge 10/07/2022 1427 by Lurline Idol, RN Outcome: Adequate for Discharge   Problem: Clinical Measurements: Goal: Ability to maintain clinical measurements within normal limits will improve Outcome: Adequate for Discharge Goal: Diagnostic test results will improve Outcome: Adequate for Discharge   Problem: Elimination: Goal: Will not experience complications related to bowel motility Outcome: Adequate for Discharge   Problem: Pain Managment: Goal: General experience of comfort will improve Outcome: Adequate for Discharge   Problem: Safety: Goal: Ability to remain free from injury will improve Outcome: Adequate for Discharge   Problem: Skin Integrity: Goal: Risk for impaired skin integrity will decrease Outcome: Adequate for Discharge   Problem: Education: Goal: Understanding of cardiac disease, CV risk reduction, and recovery process will improve Outcome: Adequate for Discharge Goal: Individualized Educational Video(s) Outcome: Adequate for Discharge   Problem: Health Behavior/Discharge Planning: Goal: Ability to safely manage health-related needs after discharge will improve Outcome: Adequate for Discharge   Problem: Education: Goal: Ability to describe self-care measures that may prevent or decrease complications (Diabetes Survival Skills Education) will improve Outcome: Adequate for Discharge Goal: Individualized Educational Video(s) Outcome: Adequate for Discharge    Problem: Coping: Goal: Ability to adjust to condition or change in health will improve Outcome: Adequate for Discharge   Problem: Health Behavior/Discharge Planning: Goal: Ability to manage health-related needs will improve Outcome: Adequate for Discharge   Problem: Metabolic: Goal: Ability to maintain appropriate glucose levels will improve Outcome: Adequate for Discharge   Problem: Nutritional: Goal: Progress toward achieving an optimal weight will improve Outcome: Adequate for Discharge

## 2022-10-07 NOTE — Progress Notes (Signed)
Patient and his wife were given discharge instructions and stated understanding. 

## 2022-10-08 ENCOUNTER — Encounter: Payer: Self-pay | Admitting: Endocrinology

## 2022-10-08 ENCOUNTER — Telehealth: Payer: Self-pay

## 2022-10-08 ENCOUNTER — Ambulatory Visit: Payer: Medicare HMO | Admitting: Endocrinology

## 2022-10-08 VITALS — BP 102/70 | HR 71 | Ht 74.0 in | Wt 201.0 lb

## 2022-10-08 DIAGNOSIS — E1065 Type 1 diabetes mellitus with hyperglycemia: Secondary | ICD-10-CM

## 2022-10-08 NOTE — Progress Notes (Signed)
Patient ID: Christopher [name], male   DOB: February 01, 1946, 76 y.o.   MRN: [id number]    Chief complaint : Follow up of Type 1 Diabetes  History of Present Illness:          Date of diagnosis: 2010  Prior history:  Previous A1c range has been 8.4-11.6   Therapy: insulin since 2013.   DKA: once (2023) Severe hypoglycemia: last episode was in 2018.  Non-insulin hypoglycemic drugs: Previously had been on Trulicity and Victoza which were stopped Previous insulins tried include Levemir and NPH  INSULIN regimen : Lantus 34--24 units bid Novolog with syringe before meals: 8 units before meals  Recent history:     A1c is at 10.1 last Fructosamine high at 427  He was told to discuss insulin pumps with the diabetes educator and given literature on 3 different pumps but he has not done so Although he has increased his morning Lantus as directed he appears to have much higher readings after meals now He is not checking blood sugars much after dinner and difficult to assess his level of control after dinner However his blood sugars are extremely erratic with some periods of extreme hyperglycemia as well as occasional hypoglycemia either overnight or mid afternoon He is not adjusting his insulin based on what he is eating or his Premeal blood sugar He does think he is taking his NovoLog before eating He was also told to switch to [place] but he says he is still a large supply of Lantus  Glucose patterns from CGM for the last 2 weeks: Overall blood sugars show high variability at all times with no pattern:  On some days he has marked hyperglycemia above 350 generally midday and afternoon and once continuing into the night the first week of the data his blood sugars were generally lower and he had hypoglycemia either overnight or afternoon Generally OVERNIGHT blood sugars are highly variable but usually mostly high especially late at night and then decreasing to variable levels including low sugars  early morning POSTPRANDIAL readings appear to go up significantly after breakfast but not consistent amounts Also blood sugars may be higher on some afternoons Blood sugars are not usually assessed after dinner but blood sugar at midnight is mostly above target levels Hypoglycemia seen on 3 occasions either overnight or mid afternoon   CGM use % of time 55  2-week average/GV 193/52  Time in range       45%  % Time Above 180 24  % Time above 250 23  % Time Below 70 8     Previously:   CGM use % of time   2-week average/GV 220/44  Time in range      28  %  % Time Above 180 23  % Time above 250 42  % Time Below 70 7     PRE-MEAL Fasting Lunch Dinner Bedtime Overall  Glucose range:       Averages: 198 2 29 213  220   POST-MEAL PC Breakfast PC Lunch PC Dinner  Glucose range:     Averages: 252 ? 51    Symptoms of hypoglycemia: Weakness, shakiness        Self-care: The diet that the patient has been following is: Relatively low fat and carbohydrate May have sugar-free cookies at bedtime Breakfast may be oatmeal with almond milk or eggs and sausage         Exercise: No consistent activity.  Most recent dietitian/nurse educator visit :    04/2022       Diabetes labs:  Lab Results  Component Value Date   HGBA1C 10.1 (H) 08/12/2022   HGBA1C 9.5 (H) 05/20/2022   HGBA1C 11.7 (A) 04/19/2022   Lab Results  Component Value Date   MICROALBUR <0.7 05/20/2022   LDLCALC 88 05/20/2022   CREATININE 1.28 (H) 10/06/2022    Admission on 10/06/2022, Discharged on 10/07/2022  Component Date Value Ref Range Status   Sodium 10/06/2022 138  135 - 145 mmol/L Final   Potassium 10/06/2022 3.8  3.5 - 5.1 mmol/L Final   Chloride 10/06/2022 106  98 - 111 mmol/L Final   CO2 10/06/2022 22  22 - 32 mmol/L Final   Glucose, Bld 10/06/2022 278 (H)  70 - 99 mg/dL Final   Glucose reference range applies only to samples taken after fasting for at least 8 hours.   BUN 10/06/2022 16  8 -  23 mg/dL Final   Creatinine, Ser 10/06/2022 1.28 (H)  0.61 - 1.24 mg/dL Final   Calcium 10/06/2022 9.4  8.9 - 10.3 mg/dL Final   GFR, Estimated 10/06/2022 58 (L)  >60 mL/min Final   Comment: (NOTE) Calculated using the CKD-EPI Creatinine Equation (2021)    Anion gap 10/06/2022 10  5 - 15 Final   Performed at [facility], [address], [place], [place] [address]   WBC 10/06/2022 6.2  4.0 - 10.5 K/uL Final   RBC 10/06/2022 5.30  4.22 - 5.81 MIL/uL Final   Hemoglobin 10/06/2022 14.1  13.0 - 17.0 g/dL Final   HCT 10/06/2022 40.2  39.0 - 52.0 % Final   MCV 10/06/2022 75.8 (L)  80.0 - 100.0 fL Final   MCH 10/06/2022 26.6  26.0 - 34.0 pg Final   MCHC 10/06/2022 35.1  30.0 - 36.0 g/dL Final   RDW 10/06/2022 14.4  11.5 - 15.5 % Final   Platelets 10/06/2022 224  150 - 400 K/uL Final   nRBC 10/06/2022 0.0  0.0 - 0.2 % Final   Performed at [facility], [address] [place], [place] [address]   Troponin I (High Sensitivity) 10/06/2022 21 (H)  <18 ng/L Final   Comment: (NOTE) Elevated high sensitivity troponin I (hsTnI) values and significant  changes across serial measurements may suggest ACS but many other  chronic and acute conditions are known to elevate hsTnI results.  Refer to the "Links" section for chest pain algorithms and additional  guidance. Performed at [facility], [address] [place], [place] [address]    Glucose-Capillary 10/06/2022 299 (H)  70 - 99 mg/dL Final   Glucose reference range applies only to samples taken after fasting for at least 8 hours.   Troponin I (High Sensitivity) 10/06/2022 21 (H)  <18 ng/L Final   Comment: (NOTE) Elevated high sensitivity troponin I (hsTnI) values and significant  changes across serial measurements may suggest ACS but many other  chronic and acute conditions are known to elevate hsTnI results.  Refer to the "Links" section for chest pain algorithms and additional   guidance. Performed at [facility], [address] [place], [place] [address]    Glucose-Capillary 10/06/2022 284 (H)  70 - 99 mg/dL Final   Glucose reference range applies only to samples taken after fasting for at least 8 hours.   Glucose-Capillary 10/06/2022 370 (H)  70 - 99 mg/dL Final   Glucose reference range applies only to samples taken after fasting for  at least 8 hours.   Glucose-Capillary 10/06/2022 325 (H)  70 - 99 mg/dL Final   Glucose reference range applies only to samples taken after fasting for at least 8 hours.   Glucose-Capillary 10/07/2022 77  70 - 99 mg/dL Final   Glucose reference range applies only to samples taken after fasting for at least 8 hours.   Glucose-Capillary 10/07/2022 217 (H)  70 - 99 mg/dL Final   Glucose reference range applies only to samples taken after fasting for at least 8 hours.  Lab on 10/04/2022  Component Date Value Ref Range Status   Fructosamine 10/04/2022 427 (H)  0 - 285 umol/L Final   Comment: Published reference interval for apparently healthy subjects between age 30 and 31 is 46 - 285 umol/L and in a poorly controlled diabetic population is 228 - 563 umol/L with a mean of 396 umol/L.    Sodium 10/04/2022 138  135 - 145 mEq/L Final   Potassium 10/04/2022 4.2  3.5 - 5.1 mEq/L Final   Chloride 10/04/2022 106  96 - 112 mEq/L Final   CO2 10/04/2022 25  19 - 32 mEq/L Final   Glucose, Bld 10/04/2022 271 (H)  70 - 99 mg/dL Final   BUN 10/04/2022 17  6 - 23 mg/dL Final   Creatinine, Ser 10/04/2022 1.45  0.40 - 1.50 mg/dL Final   GFR 10/04/2022 47.22 (L)  >60.00 mL/min Final   Calculated using the CKD-EPI Creatinine Equation (2021)   Calcium 10/04/2022 9.3  8.4 - 10.5 mg/dL Final    Allergies as of 10/08/2022   No Known Allergies      Medication List        Accurate as of October 08, 2022  4:44 PM. If you have any questions, ask your nurse or doctor.          aspirin 81 MG chewable tablet Commonly  known as: Aspirin Childrens Chew 1 tablet (81 mg total) by mouth daily.   atorvastatin 80 MG tablet Commonly known as: LIPITOR Take 1 tablet (80 mg total) by mouth daily.   BD Veo Insulin Syringe U/F 31G X 15/64" 1 ML Misc Generic drug: Insulin Syringe-Needle U-100 USE  SYRINGE TWICE DAILY   brimonidine 0.15 % ophthalmic solution Commonly known as: ALPHAGAN Place 1 drop into both eyes 2 (two) times daily.   dorzolamide-timolol 2-0.5 % ophthalmic solution Commonly known as: COSOPT Place 1 drop into both eyes 2 (two) times daily.   FreeStyle Libre 2 Reader [place] 1 each by Does not apply route daily.   FreeStyle Libre 2 Sensor Misc 1 Device by Does not apply route every 14 (fourteen) days.   glucagon 1 MG injection Inject 1 mg into the muscle once as needed. What changed:  when to take this reasons to take this   Lantus SoloStar 100 UNIT/ML Solostar Pen Generic drug: insulin glargine Inject 24-34 Units into the skin See admin instructions. 34 units in the morning, 24 units in the evening   lisinopril 5 MG tablet Commonly known as: ZESTRIL Take 1 tablet (5 mg total) by mouth daily.   Multivitamin Adults 50+ Tabs Take 1 tablet by mouth daily.   multivitamin tablet Take 1 tablet by mouth daily.   NovoLOG FlexPen 100 UNIT/ML FlexPen Generic drug: insulin aspart Inject 8-10 Units into the skin 3 (three) times daily with meals. What changed: how much to take   OneTouch Verio test strip Generic drug: glucose blood 1 each by Other route 2 (two) times daily. And  lancets 2/day   potassium chloride 10 MEQ tablet Commonly known as: KLOR-CON Take 1 tablet (10 mEq total) by mouth daily. 10 mEq daily What changed: additional instructions   TYLENOL PO Take 1-2 tablets by mouth daily as needed (pain, headache).        Allergies: No Known Allergies  Past Medical History:  Diagnosis Date   Diabetes mellitus type II    GERD (gastroesophageal reflux disease)    Glaucoma     Hyperlipidemia    Hypertension     Past Surgical History:  Procedure Laterality Date   COLONOSCOPY     HERNIA REPAIR     PATELLA FRACTURE SURGERY  1993    Family History  Problem Relation Age of Onset   Alcohol abuse Father    Diabetes Sister    Colon cancer Neg Hx    Esophageal cancer Neg Hx    Stomach cancer Neg Hx    Rectal cancer Neg Hx     Social History:  reports that he has never smoked. He has never used smokeless tobacco. He reports that he does not drink alcohol and does not use drugs.    Review of Systems:   Blood pressure: Usually controlled, only on 5 mg lisinopril  BP Readings from Last 3 Encounters:  10/08/22 102/70  10/07/22 129/87  08/14/22 126/82     Lipids: He has been treated with 80 mg Lipitor prescribed by other physicians  Lab Results  Component Value Date   CHOL 153 05/20/2022   HDL 53.80 05/20/2022   LDLCALC 88 05/20/2022   LDLDIRECT 135.3 06/17/2013   TRIG 57.0 05/20/2022   CHOLHDL 3 05/20/2022     Diabetes complications: None, no microalbuminuria Last eye exam 2/23 Last foot exam 10/22    Physical Examination:  BP 102/70   Pulse 71   Ht '6\' 2"'$  (1.88 m)   Wt 201 lb (91.2 kg)   SpO2 97%   BMI 25.81 kg/m         ASSESSMENT/PLAN   Diabetes type 1 with persistently poor control   A1c consistently over 8% and last 10.1 Fructosamine 427  Currently on twice a day Lantus and mealtime NovoLog  Problems identified: Inadequate time in range with 45 % of sugars in target and 8 % below 70  Extreme variability in blood sugars consistent pattern Has some tendency to hypoglycemia overnight but not clear why he has marked postprandial hyperglycemia at times Difficult to know his compliance for mealtime insulin May have some hypoglycemia unawareness Needs considerable more diabetes education but he has not made appointments for the education as recommended    Recommendations:  LANTUS insulin: Take 38 units at breakfast  and 20 at dinnertime We will need to let us know when he is about to finish this so that we can switch him to [place] He was again given information on different insulin pumps and told to contact his insurance or the manufacture for coverage He will need to follow-up with diabetes educator to review his progress with his insulin adjustment as well as discuss insulin pump For now he will increase his NovoLog to 12 units at breakfast and take 8 to 10 units if eating eggs or meat in the morning Also take at least 10 units at lunch and dinner Needs to check his blood sugar consistently sometime after dinner to help adjust the evening dose Additional doses of 4 to 6 units will be used when blood sugars are over 200 Premeal C-peptide will  be ordered  Total visit time including counseling = 30 minutes   Patient Instructions  Check blood sugars on waking up   Also check blood sugars about 2 hours after meals and do this after different meals by rotation  Recommended blood sugar levels on waking up are 90-130 and about 2 hours after meal is 130-180  Please bring your blood sugar monitor to each visit, thank you   Lantus 38 in am and 20 at nite   NOVOLOG: Take 12 units for oatmeal, 8-10 for higher protein meals Also at lunch and dinner take 10-12 units based on meal size  For sugars >200 add extra NOVOLOG;  200-250, ADD 4 MORE,  OVER 250 ADD 6 EXTRA  Check Medtronic 780 pump                       Nathasha Fiorillo 10/08/2022, 4:44 PM

## 2022-10-08 NOTE — Telephone Encounter (Signed)
Transition Care Management Follow-up Telephone Call Date of discharge and from where: [facility] 10-07-22 Dx: chest pain  How have you been since you were released from the hospital? Doing ok  Any questions or concerns? Yes  Items Reviewed: Did the pt receive and understand the discharge instructions provided? Yes  Medications obtained and verified? Yes  Other? No  Any new allergies since your discharge? No  Dietary orders reviewed? Yes Do you have support at home? Yes   Home Care and Equipment/Supplies: Were home health services ordered? no If so, what is the name of the agency? na  Has the agency set up a time to come to the patient's home? not applicable Were any new equipment or medical supplies ordered?  No What is the name of the medical supply agency? na Were you able to get the supplies/equipment? not applicable Do you have any questions related to the use of the equipment or supplies? No  Functional Questionnaire: (I = Independent and D = Dependent) ADLs: I  Bathing/Dressing- I  Meal Prep- I  Eating- I  Maintaining continence- I  Transferring/Ambulation- I  Managing Meds- I  Follow up appointments reviewed:  PCP Hospital f/u appt confirmed? Yes  Scheduled to see Dr Deniece Ree on 10-15-22 @ [facility] f/u appt confirmed? Yes  Scheduled to see Dr Dwyane Dee on 10-08-22 @ 1pm. Are transportation arrangements needed? No  If their condition worsens, is the pt aware to call PCP or go to the Emergency Dept.? Yes Was the patient provided with contact information for the PCP's office or ED? Yes Was to pt encouraged to call back with questions or concerns? Yes   Juanda Crumble LPN [facility] [phone]

## 2022-10-08 NOTE — Progress Notes (Signed)
Erroneous note

## 2022-10-08 NOTE — Patient Instructions (Addendum)
Check blood sugars on waking up   Also check blood sugars about 2 hours after meals and do this after different meals by rotation  Recommended blood sugar levels on waking up are 90-130 and about 2 hours after meal is 130-180  Please bring your blood sugar monitor to each visit, thank you   Lantus 38 in am and 20 at nite   NOVOLOG: Take 12 units for oatmeal, 8-10 for higher protein meals Also at lunch and dinner take 10-12 units based on meal size  For sugars >200 add extra NOVOLOG;  200-250, ADD 4 MORE,  OVER 250 ADD 6 EXTRA  Check Medtronic 780 pump

## 2022-10-15 ENCOUNTER — Inpatient Hospital Stay: Payer: Medicare HMO | Admitting: Internal Medicine

## 2022-10-15 ENCOUNTER — Encounter: Payer: Self-pay | Admitting: Endocrinology

## 2022-10-29 ENCOUNTER — Other Ambulatory Visit: Payer: Self-pay

## 2022-10-29 DIAGNOSIS — E1065 Type 1 diabetes mellitus with hyperglycemia: Secondary | ICD-10-CM

## 2022-10-29 MED ORDER — INSULIN PEN NEEDLE 32G X 4 MM MISC: 1 refills | Status: DC

## 2022-10-30 ENCOUNTER — Ambulatory Visit (INDEPENDENT_AMBULATORY_CARE_PROVIDER_SITE_OTHER): Payer: Medicare HMO

## 2022-10-30 DIAGNOSIS — Z23 Encounter for immunization: Secondary | ICD-10-CM

## 2022-11-01 ENCOUNTER — Emergency Department (HOSPITAL_COMMUNITY)
Admission: EM | Admit: 2022-11-01 | Discharge: 2022-11-01 | Discharge status: Against Medical Advice | Payer: Medicare HMO | Attending: Emergency Medicine | Admitting: Emergency Medicine

## 2022-11-01 ENCOUNTER — Telehealth: Payer: Self-pay

## 2022-11-01 ENCOUNTER — Other Ambulatory Visit: Payer: Self-pay

## 2022-11-01 ENCOUNTER — Encounter (HOSPITAL_COMMUNITY): Payer: Self-pay

## 2022-11-01 DIAGNOSIS — E11649 Type 2 diabetes mellitus with hypoglycemia without coma: Secondary | ICD-10-CM | POA: Diagnosis not present

## 2022-11-01 DIAGNOSIS — Z5321 Procedure and treatment not carried out due to patient leaving prior to being seen by health care provider: Secondary | ICD-10-CM | POA: Diagnosis not present

## 2022-11-01 DIAGNOSIS — E162 Hypoglycemia, unspecified: Secondary | ICD-10-CM | POA: Diagnosis not present

## 2022-11-01 LAB — BASIC METABOLIC PANEL
Anion gap: 9 (ref 5–15)
BUN: 18 mg/dL (ref 8–23)
CO2: 22 mmol/L (ref 22–32)
Calcium: 9.2 mg/dL (ref 8.9–10.3)
Chloride: 109 mmol/L (ref 98–111)
Creatinine, Ser: 1.42 mg/dL — ABNORMAL HIGH (ref 0.61–1.24)
GFR, Estimated: 52 mL/min — ABNORMAL LOW (ref >60)
Glucose, Bld: 117 mg/dL — ABNORMAL HIGH (ref 70–99)
Potassium: 3.5 mmol/L (ref 3.5–5.1)
Sodium: 140 mmol/L (ref 135–145)

## 2022-11-01 LAB — CBC
HCT: 37.5 % — ABNORMAL LOW (ref 39.0–52.0)
Hemoglobin: 13.1 g/dL (ref 13.0–17.0)
MCH: 26.6 pg (ref 26.0–34.0)
MCHC: 34.9 g/dL (ref 30.0–36.0)
MCV: 76.1 fL — ABNORMAL LOW (ref 80.0–100.0)
Platelets: 227 10*3/uL (ref 150–400)
RBC: 4.93 MIL/uL (ref 4.22–5.81)
RDW: 13.9 % (ref 11.5–15.5)
WBC: 5.4 10*3/uL (ref 4.0–10.5)
nRBC: 0 % (ref 0.0–0.2)

## 2022-11-01 LAB — CBG MONITORING, ED
Glucose-Capillary: 112 mg/dL — ABNORMAL HIGH (ref 70–99)
Glucose-Capillary: 229 mg/dL — ABNORMAL HIGH (ref 70–99)

## 2022-11-01 NOTE — Telephone Encounter (Signed)
Wife called in to let us know that pt had a low blood sugar and had to go to ED. Blood sugar went up while waiting and they left. He is doing fine now but wants to see Vaughan Basta about meter. Please call wife for appt

## 2022-11-01 NOTE — ED Notes (Signed)
Pt stated that he felt better and wanted to go home. Pt was seen walking out of the ED with family.

## 2022-11-01 NOTE — ED Provider Triage Note (Signed)
Emergency Medicine Provider Triage Evaluation Note  Christopher [name] , a 76 y.o. male  was evaluated in triage.  Pt complains of hypoglycemia. States that he is currently out of lancets to check his blood sugar at home. States that this evening he took his prescribed 20 units of lantus and then ate dinner. States that shortly after his wife noticed that he was confused, states that she was concerned that his blood sugar was low and made him a peanut butter sandwich and his symptoms completely resolved after and he has continued to be asymptomatic since. Patient states that he is concerned that his blood sugar is going to drop again and he doesn't currently have any way to monitor his readings. He denies fevers, chills, headache, chest pain, shortness of breath   Review of Systems  Positive:  Negative:   Physical Exam  BP (!) 149/88   Pulse 66   Temp 97.7 F (36.5 C) (Oral)   Resp 16   Ht '6\' 2"'$  (1.88 m)   Wt 91.2 kg   SpO2 100%   BMI 25.81 kg/m  Gen:   Awake, no distress   Resp:  Normal effort  MSK:   Moves extremities without difficulty  Other:  Alert and oriented and neurologically intact without focal deficits  Medical Decision Making  Medically screening exam initiated at 12:42 AM.  Appropriate orders placed.  OSMOND STECKMAN was informed that the remainder of the evaluation will be completed by another provider, this initial triage assessment does not replace that evaluation, and the importance of remaining in the ED until their evaluation is complete.     Bud Face, PA-C 11/01/22 [phone]

## 2022-11-01 NOTE — ED Triage Notes (Signed)
Pt reports he thinks his blood sugar dropped tonight. His wife reports he was walking in the hallway tonight around 2330 and suddenly he couldn't move. She gave him a sandwich and peanut butter and he started feeling better. He does have a sensor to his right arm but it is not currently working. He took 20 units of lantus tonight 6pm but did not check his blood sugar prior. He also reports he is out of needles. He reports feeling okay now. A&OX4.

## 2022-11-12 ENCOUNTER — Ambulatory Visit: Payer: Medicare HMO | Admitting: Dietician

## 2022-11-18 ENCOUNTER — Encounter: Payer: Self-pay | Admitting: Dietician

## 2022-11-18 ENCOUNTER — Encounter: Payer: Medicare HMO | Attending: Endocrinology | Admitting: Dietician

## 2022-11-18 VITALS — Ht 75.0 in | Wt 208.0 lb

## 2022-11-18 DIAGNOSIS — E1069 Type 1 diabetes mellitus with other specified complication: Secondary | ICD-10-CM | POA: Insufficient documentation

## 2022-11-18 NOTE — Telephone Encounter (Signed)
Appt. Scheduled with Mickel Baas today

## 2022-11-18 NOTE — Progress Notes (Signed)
Diabetes Self-Management Education  Visit Type: Follow-up  Appt. Start Time: 1630 Appt. End Time: 1761  11/18/2022  [name], identified by name and date of birth, is a 76 y.o. male with a diagnosis of Diabetes:  .   ASSESSMENT Patient is here today with his wife.  He was last seen by myself on 04/11/2022.  He states that sometimes he is unable to obtain his medications due to insurance requirements.  This has made taking his medication difficult.  He has not done the c-peptide test yet as the day of the lab his blood glucose was high.    He does not remember how to use a blood glucose meter.  This was reviewed with patient.  Difficult to obtain blood with finger stick.  He is currently using a [employer].  They state that they have an order for an Omnipod 5 ready to be shipped if approved. Discussed that he will need the Dexcom G6 first.  His phone is compatible and the Dexcom G6 app was added to his phone.  Will message Dr. Ronnie Derby nurse regarding this.  Also, he states that he is to switch to [employer] but the company continues to ship Lantus.  He is aiming to eat healthfully.  They are interested in the Type 1 Diabetes Support Group and have added them to the email list.  History includes:  Type 1 Diabetes, HLD, HTN, glaucoma Labs:  A1C 10.1% 08/12/2022 increased from 9.5% 05/20/2022 Medications include:  Lantus 38 q am and 20 q HS, Novolog 12 units for Breakfast -  oatmeal and 8-10 for higher protein meals, Lunch and dinner 10-12 units based on meal size CGM Libre 2  75" 208 lbs 11/18/2022  Patient lives with his wife.  Height _0  (1.905 m), weight 208 lb (94.3 kg). Body mass index is 26 kg/m.   Diabetes Self-Management Education - 11/18/22 1816       Visit Information   Visit Type Follow-up      Psychosocial Assessment   What is the hardest part about your diabetes right now, causing you the most concern, or is the most worrisome to you about your  diabetes?   Taking/obtaining medications    Self-management support Doctor's office    Other persons present Patient;Spouse/SO    Patient Concerns Problem Solving;Glycemic Control    Special Needs None    Preferred Learning Style No preference indicated    Learning Readiness Ready    How often do you need to have someone help you when you read instructions, pamphlets, or other written materials from your doctor or pharmacy? 1 - Never      Pre-Education Assessment   Patient understands the diabetes disease and treatment process. Needs Review    Patient understands incorporating nutritional management into lifestyle. Needs Review    Patient undertands incorporating physical activity into lifestyle. Needs Review    Patient understands using medications safely. Needs Review    Patient understands monitoring blood glucose, interpreting and using results Needs Review    Patient understands prevention, detection, and treatment of acute complications. Needs Review    Patient understands prevention, detection, and treatment of chronic complications. Needs Review    Patient understands how to develop strategies to address psychosocial issues. Needs Review    Patient understands how to develop strategies to promote health/change behavior. Needs Review      Complications   Last HgB A1C per patient/outside source 10.1 %   08/12/2022 increased from 9.5% 05/20/2022  How often do you check your blood sugar? > 4 times/day    Fasting Blood glucose range (mg/dL) 70-129;130-179    Postprandial Blood glucose range (mg/dL) 180-200;>200      Dietary Intake   Breakfast oatmeal OR plain cheerios with almond milk, occasional raisins    Lunch sandwich or something simple    Dinner chicken or fish, brown rice with gravy or sweet potato, vegetables OR occasional pizza or meat balls    Beverage(s) water, diet soda, alsmond milk      Patient Education   Previous Diabetes Education Yes (please comment)   04/2022    Healthy Eating Meal options for control of blood glucose level and chronic complications.    Medications Reviewed patients medication for diabetes, action, purpose, timing of dose and side effects.    Monitoring Taught/evaluated CGM (comment);Taught/evaluated SMBG meter.    Acute complications Taught prevention, symptoms, and  treatment of hypoglycemia - the 15 rule.    Diabetes Stress and Support Identified and addressed patients feelings and concerns about diabetes;Worked with patient to identify barriers to care and solutions      Individualized Goals (developed by patient)   Nutrition General guidelines for healthy choices and portions discussed    Monitoring  Consistenly use CGM    Problem Solving Addressing barriers to behavior change;Medication consistency      Patient Self-Evaluation of Goals - Patient rates self as meeting previously set goals (% of time)   Nutrition 50 - 75 % (half of the time)    Medications >75% (most of the time)    Monitoring >75% (most of the time)    Problem Solving and behavior change strategies  25 - 50% (sometimes)    Reducing Risk (treating acute and chronic complications) 50 - 75 % (half of the time)    Health Coping 50 - 75 % (half of the time)      Post-Education Assessment   Patient understands the diabetes disease and treatment process. Needs Review    Patient understands incorporating nutritional management into lifestyle. Needs Review    Patient undertands incorporating physical activity into lifestyle. Needs Review    Patient understands using medications safely. Comphrehends key points    Patient understands monitoring blood glucose, interpreting and using results Comprehends key points    Patient understands prevention, detection, and treatment of acute complications. Comprehends key points    Patient understands prevention, detection, and treatment of chronic complications. Needs Review    Patient understands how to develop strategies to  address psychosocial issues. Comprehends key points    Patient understands how to develop strategies to promote health/change behavior. Needs Review      Outcomes   Expected Outcomes Demonstrated interest in learning. Expect positive outcomes    Future DMSE 2 months    Program Status Not Completed             Individualized Plan for Diabetes Self-Management Training:   Learning Objective:  Patient will have a greater understanding of diabetes self-management. Patient education plan is to attend individual and/or group sessions per assessed needs and concerns.   Plan:   Patient Instructions  Type 1 Diabetes Support Group  1st Wednesday of each month at 6 pm.  Look for e-mails for location and topics.  December Meeting:  Wednesday December 6 at 6:00 at [place] room  Get your C-peptide as prescribed.  You have an Omnipod 5 starter kit (insulin pump) ready if choose this.    We meed  to start a Dexcom G6 before you start the insulin pump.  Please call for a training appointment when you get this.  I will speak to Dr. Ronnie Derby nurse about this.  Continue to watch the trend of your blood glucose  Expected Outcomes:  Demonstrated interest in learning. Expect positive outcomes  Education material provided:   If problems or questions, patient to contact team via:  Phone  Future DSME appointment: 2 months

## 2022-11-18 NOTE — Patient Instructions (Signed)
Type 1 Diabetes Support Group  1st Wednesday of each month at 6 pm.  Look for e-mails for location and topics.  December Meeting:  Wednesday December 6 at 6:00 at [place] room  Get your C-peptide as prescribed.  You have an Omnipod 5 starter kit (insulin pump) ready if choose this.    We meed to start a Dexcom G6 before you start the insulin pump.  Please call for a training appointment when you get this.  I will speak to Dr. Ronnie Derby nurse about this.  Continue to watch the trend of your blood glucose

## 2022-11-19 ENCOUNTER — Other Ambulatory Visit: Payer: Self-pay

## 2022-11-19 DIAGNOSIS — E1065 Type 1 diabetes mellitus with hyperglycemia: Secondary | ICD-10-CM

## 2022-11-19 MED ORDER — DEXCOM G6 TRANSMITTER MISC: 2 refills | Status: DC

## 2022-11-19 MED ORDER — DEXCOM G6 SENSOR MISC: 2 refills | Status: DC

## 2022-11-19 MED ORDER — DEXCOM G7 RECEIVER DEVI: 0 refills | Status: DC

## 2022-11-20 ENCOUNTER — Other Ambulatory Visit (INDEPENDENT_AMBULATORY_CARE_PROVIDER_SITE_OTHER): Payer: Medicare HMO

## 2022-11-20 DIAGNOSIS — E1065 Type 1 diabetes mellitus with hyperglycemia: Secondary | ICD-10-CM

## 2022-11-20 LAB — GLUCOSE, RANDOM: Glucose, Bld: 140 mg/dL — ABNORMAL HIGH (ref 70–99)

## 2022-11-21 LAB — C-PEPTIDE: C-Peptide: <0.1 ng/mL — ABNORMAL LOW (ref 1.1–4.4)

## 2022-11-22 ENCOUNTER — Telehealth: Payer: Self-pay | Admitting: Dietician

## 2022-11-22 NOTE — Telephone Encounter (Signed)
Returned patient call.   His pharmacy called and stated that they were able to fill his prescription.  Dr.  Ronnie Derby nurse was present during the call and is aware.   He is not aware of the Dexcom at this time but will call regarding this when he receives this as he will need trained.  I think he is still unsure about change.  He and his wife attended the Type 1 Diabetes Support Group this week and will continue follow up.  Christopher [name], RD, LDN, CDCES

## 2022-11-29 ENCOUNTER — Other Ambulatory Visit: Payer: Self-pay

## 2022-11-29 ENCOUNTER — Observation Stay (HOSPITAL_COMMUNITY)
Admission: EM | Admit: 2022-11-29 | Discharge: 2022-11-29 | Disposition: A | Discharge status: Home / Self Care | Payer: Medicare HMO | Attending: Internal Medicine | Admitting: Internal Medicine

## 2022-11-29 ENCOUNTER — Observation Stay (HOSPITAL_COMMUNITY): Payer: Medicare HMO

## 2022-11-29 ENCOUNTER — Emergency Department (HOSPITAL_COMMUNITY): Payer: Medicare HMO

## 2022-11-29 ENCOUNTER — Telehealth: Payer: Self-pay

## 2022-11-29 DIAGNOSIS — Z1152 Encounter for screening for COVID-19: Secondary | ICD-10-CM | POA: Diagnosis not present

## 2022-11-29 DIAGNOSIS — N183 Chronic kidney disease, stage 3 unspecified: Secondary | ICD-10-CM | POA: Insufficient documentation

## 2022-11-29 DIAGNOSIS — R4781 Slurred speech: Secondary | ICD-10-CM

## 2022-11-29 DIAGNOSIS — G459 Transient cerebral ischemic attack, unspecified: Principal | ICD-10-CM | POA: Insufficient documentation

## 2022-11-29 DIAGNOSIS — R4182 Altered mental status, unspecified: Secondary | ICD-10-CM | POA: Diagnosis not present

## 2022-11-29 DIAGNOSIS — R410 Disorientation, unspecified: Secondary | ICD-10-CM | POA: Diagnosis not present

## 2022-11-29 DIAGNOSIS — E1122 Type 2 diabetes mellitus with diabetic chronic kidney disease: Secondary | ICD-10-CM | POA: Diagnosis not present

## 2022-11-29 DIAGNOSIS — Z79899 Other long term (current) drug therapy: Secondary | ICD-10-CM | POA: Insufficient documentation

## 2022-11-29 DIAGNOSIS — I63233 Cerebral infarction due to unspecified occlusion or stenosis of bilateral carotid arteries: Secondary | ICD-10-CM | POA: Diagnosis not present

## 2022-11-29 DIAGNOSIS — E11649 Type 2 diabetes mellitus with hypoglycemia without coma: Secondary | ICD-10-CM | POA: Diagnosis not present

## 2022-11-29 DIAGNOSIS — R4701 Aphasia: Secondary | ICD-10-CM | POA: Diagnosis not present

## 2022-11-29 DIAGNOSIS — R0902 Hypoxemia: Secondary | ICD-10-CM | POA: Diagnosis not present

## 2022-11-29 DIAGNOSIS — R2681 Unsteadiness on feet: Secondary | ICD-10-CM | POA: Diagnosis not present

## 2022-11-29 DIAGNOSIS — I10 Essential (primary) hypertension: Secondary | ICD-10-CM | POA: Diagnosis not present

## 2022-11-29 DIAGNOSIS — Z7982 Long term (current) use of aspirin: Secondary | ICD-10-CM | POA: Diagnosis not present

## 2022-11-29 DIAGNOSIS — E162 Hypoglycemia, unspecified: Secondary | ICD-10-CM | POA: Insufficient documentation

## 2022-11-29 DIAGNOSIS — J3489 Other specified disorders of nose and nasal sinuses: Secondary | ICD-10-CM | POA: Diagnosis not present

## 2022-11-29 DIAGNOSIS — I499 Cardiac arrhythmia, unspecified: Secondary | ICD-10-CM | POA: Diagnosis not present

## 2022-11-29 DIAGNOSIS — E785 Hyperlipidemia, unspecified: Secondary | ICD-10-CM | POA: Diagnosis present

## 2022-11-29 DIAGNOSIS — I129 Hypertensive chronic kidney disease with stage 1 through stage 4 chronic kidney disease, or unspecified chronic kidney disease: Secondary | ICD-10-CM | POA: Insufficient documentation

## 2022-11-29 DIAGNOSIS — Z794 Long term (current) use of insulin: Secondary | ICD-10-CM | POA: Insufficient documentation

## 2022-11-29 DIAGNOSIS — R200 Anesthesia of skin: Secondary | ICD-10-CM | POA: Diagnosis not present

## 2022-11-29 DIAGNOSIS — R29818 Other symptoms and signs involving the nervous system: Secondary | ICD-10-CM | POA: Diagnosis not present

## 2022-11-29 DIAGNOSIS — G9341 Metabolic encephalopathy: Secondary | ICD-10-CM | POA: Diagnosis not present

## 2022-11-29 DIAGNOSIS — G319 Degenerative disease of nervous system, unspecified: Secondary | ICD-10-CM | POA: Diagnosis not present

## 2022-11-29 LAB — COMPREHENSIVE METABOLIC PANEL
ALT: 16 U/L (ref 0–44)
AST: 20 U/L (ref 15–41)
Albumin: 3.4 g/dL — ABNORMAL LOW (ref 3.5–5.0)
Alkaline Phosphatase: 72 U/L (ref 38–126)
Anion gap: 9 (ref 5–15)
BUN: 18 mg/dL (ref 8–23)
CO2: 22 mmol/L (ref 22–32)
Calcium: 8.5 mg/dL — ABNORMAL LOW (ref 8.9–10.3)
Chloride: 105 mmol/L (ref 98–111)
Creatinine, Ser: 1.36 mg/dL — ABNORMAL HIGH (ref 0.61–1.24)
GFR, Estimated: 54 mL/min — ABNORMAL LOW (ref >60)
Glucose, Bld: 267 mg/dL — ABNORMAL HIGH (ref 70–99)
Potassium: 4.4 mmol/L (ref 3.5–5.1)
Sodium: 136 mmol/L (ref 135–145)
Total Bilirubin: 0.9 mg/dL (ref 0.3–1.2)
Total Protein: 5.9 g/dL — ABNORMAL LOW (ref 6.5–8.1)

## 2022-11-29 LAB — CBC
HCT: 36.4 % — ABNORMAL LOW (ref 39.0–52.0)
Hemoglobin: 12.6 g/dL — ABNORMAL LOW (ref 13.0–17.0)
MCH: 25.8 pg — ABNORMAL LOW (ref 26.0–34.0)
MCHC: 34.6 g/dL (ref 30.0–36.0)
MCV: 74.6 fL — ABNORMAL LOW (ref 80.0–100.0)
Platelets: 214 10*3/uL (ref 150–400)
RBC: 4.88 MIL/uL (ref 4.22–5.81)
RDW: 14.3 % (ref 11.5–15.5)
WBC: 5.8 10*3/uL (ref 4.0–10.5)
nRBC: 0 % (ref 0.0–0.2)

## 2022-11-29 LAB — RAPID URINE DRUG SCREEN, HOSP PERFORMED
Amphetamines: NOT DETECTED
Barbiturates: NOT DETECTED
Benzodiazepines: NOT DETECTED
Cocaine: NOT DETECTED
Opiates: NOT DETECTED
Tetrahydrocannabinol: NOT DETECTED

## 2022-11-29 LAB — DIFFERENTIAL
Abs Immature Granulocytes: 0.01 10*3/uL (ref 0.00–0.07)
Basophils Absolute: 0 10*3/uL (ref 0.0–0.1)
Basophils Relative: 1 %
Eosinophils Absolute: 0.1 10*3/uL (ref 0.0–0.5)
Eosinophils Relative: 2 %
Immature Granulocytes: 0 %
Lymphocytes Relative: 17 %
Lymphs Abs: 1 10*3/uL (ref 0.7–4.0)
Monocytes Absolute: 0.7 10*3/uL (ref 0.1–1.0)
Monocytes Relative: 11 %
Neutro Abs: 4 10*3/uL (ref 1.7–7.7)
Neutrophils Relative %: 69 %

## 2022-11-29 LAB — POCT I-STAT, CHEM 8
BUN: 19 mg/dL (ref 8–23)
Calcium, Ion: 0.92 mmol/L — ABNORMAL LOW (ref 1.15–1.40)
Chloride: 110 mmol/L (ref 98–111)
Creatinine, Ser: 1.3 mg/dL — ABNORMAL HIGH (ref 0.61–1.24)
Glucose, Bld: 167 mg/dL — ABNORMAL HIGH (ref 70–99)
HCT: 41 % (ref 39.0–52.0)
Hemoglobin: 13.9 g/dL (ref 13.0–17.0)
Potassium: 3.8 mmol/L (ref 3.5–5.1)
Sodium: 140 mmol/L (ref 135–145)
TCO2: 19 mmol/L — ABNORMAL LOW (ref 22–32)

## 2022-11-29 LAB — URINALYSIS, ROUTINE W REFLEX MICROSCOPIC
Bacteria, UA: NONE SEEN
Bilirubin Urine: NEGATIVE
Glucose, UA: >=500 mg/dL — AB
Hgb urine dipstick: NEGATIVE
Ketones, ur: NEGATIVE mg/dL
Leukocytes,Ua: NEGATIVE
Nitrite: NEGATIVE
Protein, ur: NEGATIVE mg/dL
Specific Gravity, Urine: 1.037 — ABNORMAL HIGH (ref 1.005–1.030)
pH: 7 (ref 5.0–8.0)

## 2022-11-29 LAB — RESP PANEL BY RT-PCR (RSV, FLU A&B, COVID)  RVPGX2
Influenza A by PCR: NEGATIVE
Influenza B by PCR: NEGATIVE
Resp Syncytial Virus by PCR: NEGATIVE
SARS Coronavirus 2 by RT PCR: NEGATIVE

## 2022-11-29 LAB — ETHANOL: Alcohol, Ethyl (B): <10 mg/dL (ref <10)

## 2022-11-29 LAB — CBG MONITORING, ED: Glucose-Capillary: 170 mg/dL — ABNORMAL HIGH (ref 70–99)

## 2022-11-29 LAB — PROTIME-INR
INR: 1.2 (ref 0.8–1.2)
Prothrombin Time: 14.6 seconds (ref 11.4–15.2)

## 2022-11-29 LAB — APTT: aPTT: 27 seconds (ref 24–36)

## 2022-11-29 MED ORDER — ASPIRIN 81 MG PO CHEW
81.0000 mg | CHEWABLE_TABLET | Freq: Every day | ORAL | Status: DC
  Administered 2022-11-29: 81 mg via ORAL
  Filled 2022-11-29: qty 1

## 2022-11-29 MED ORDER — ENOXAPARIN SODIUM 40 MG/0.4ML IJ SOSY
40.0000 mg | PREFILLED_SYRINGE | INTRAMUSCULAR | Status: DC
  Administered 2022-11-29: 40 mg via SUBCUTANEOUS
  Filled 2022-11-29: qty 0.4

## 2022-11-29 MED ORDER — SENNOSIDES-DOCUSATE SODIUM 8.6-50 MG PO TABS: 1.0000 | ORAL_TABLET | Freq: Every evening | ORAL | Status: DC | PRN

## 2022-11-29 MED ORDER — ACETAMINOPHEN 650 MG RE SUPP: 650.0000 mg | RECTAL | Status: DC | PRN

## 2022-11-29 MED ORDER — ACETAMINOPHEN 325 MG PO TABS: 650.0000 mg | ORAL_TABLET | ORAL | Status: DC | PRN

## 2022-11-29 MED ORDER — ACETAMINOPHEN 160 MG/5ML PO SOLN: 650.0000 mg | ORAL | Status: DC | PRN

## 2022-11-29 MED ORDER — CLOPIDOGREL BISULFATE 75 MG PO TABS
75.0000 mg | ORAL_TABLET | Freq: Every day | ORAL | Status: DC
  Administered 2022-11-29: 75 mg via ORAL
  Filled 2022-11-29: qty 1

## 2022-11-29 MED ORDER — LISINOPRIL 10 MG PO TABS: 5.0000 mg | ORAL_TABLET | Freq: Every day | ORAL | Status: DC

## 2022-11-29 MED ORDER — IOHEXOL 350 MG/ML SOLN
100.0000 mL | Freq: Once | INTRAVENOUS | Status: AC | PRN
  Administered 2022-11-29: 100 mL via INTRAVENOUS

## 2022-11-29 MED ORDER — DORZOLAMIDE HCL-TIMOLOL MAL 2-0.5 % OP SOLN: 1.0000 [drp] | Freq: Two times a day (BID) | OPHTHALMIC | Status: DC

## 2022-11-29 MED ORDER — ACETAMINOPHEN 325 MG PO TABS: 650.0000 mg | ORAL_TABLET | Freq: Four times a day (QID) | ORAL | Status: DC | PRN

## 2022-11-29 MED ORDER — STROKE: EARLY STAGES OF RECOVERY BOOK: Freq: Once | Status: DC

## 2022-11-29 MED ORDER — ATORVASTATIN CALCIUM 80 MG PO TABS
80.0000 mg | ORAL_TABLET | Freq: Every day | ORAL | Status: DC
  Administered 2022-11-29: 80 mg via ORAL
  Filled 2022-11-29: qty 2

## 2022-11-29 MED ORDER — BRIMONIDINE TARTRATE 0.15 % OP SOLN: 1.0000 [drp] | Freq: Two times a day (BID) | OPHTHALMIC | Status: DC

## 2022-11-29 MED ORDER — INSULIN ASPART 100 UNIT/ML IJ SOLN: 0.0000 [IU] | Freq: Three times a day (TID) | INTRAMUSCULAR | Status: DC

## 2022-11-29 MED ORDER — LORAZEPAM 0.5 MG PO TABS: 0.5000 mg | ORAL_TABLET | Freq: Four times a day (QID) | ORAL | Status: DC | PRN

## 2022-11-29 NOTE — Discharge Summary (Signed)
Physician Discharge Summary   Patient: Christopher [name] MRN: [id number] DOB: 12/21/1945  Admit date:     11/29/2022  Discharge date: 11/29/22  Discharge Physician: Lequita Halt   PCP: Isaac Bliss, Rayford Halsted, MD   Recommendations at discharge:   Recommend decreased at bedtime dose of Humalog from 10 units to 5 units, follow-up with PCP in 1 to 2 weeks and recheck A1c in 3 months.  Discharge Diagnoses: Principal Problem:   TIA (transient ischemic attack) Active Problems:   HLD (hyperlipidemia)   Hypertension   Hypoglycemia  Resolved Problems:  Acute metabolic encephalopathy likely secondary to hypoglycemia, stroke ruled out.  Hospital Course:  Patient with history of hypertension diabetes on insulin, presented with sudden onset of confusion and slurred speech this morning, when his wife found his glucose level was 50 and patient also had other symptoms associated with hypoglycemia including sweating and feeling dizzy and nausea.  Family also reported recent frequent episodes of morning hypoglycemia episodes but not as severe as today's.  In the ED, his hypoglycemia was resolved.  Neuroexam benign.  Neurowork-up CT head CTA and MRI negative for acute stroke, no significant critical stenosis of major vessels in the brain and neck.  Given that his clinical course is benign and all his symptoms appears related to hypoglycemia, recommend patient cut down evening dose of Humalog from 10 units to 5 units to avoid morning hypoglycemia.  Patient and his wife agree.  Discharge home on stable conditions.    Pain control - [employer] Controlled Substance Reporting System database was reviewed. and patient was instructed, not to drive, operate heavy machinery, perform activities at heights, swimming or participation in water activities or provide baby-sitting services while on Pain, Sleep and Anxiety Medications; until their outpatient Physician has advised to do so again. Also recommended  to not to take more than prescribed Pain, Sleep and Anxiety Medications.  Consultants: Neurology Procedures performed: CT head, CTA head and neck and brain MRI Disposition: Home Diet recommendation:  Cardiac and Carb modified diet DISCHARGE MEDICATION: Allergies as of 11/29/2022   No Known Allergies      Medication List     STOP taking these medications    glucagon 1 MG injection       TAKE these medications    aspirin 81 MG chewable tablet Commonly known as: Aspirin Childrens Chew 1 tablet (81 mg total) by mouth daily.   atorvastatin 80 MG tablet Commonly known as: LIPITOR Take 1 tablet (80 mg total) by mouth daily.   BD Veo Insulin Syringe U/F 31G X 15/64" 1 ML Misc Generic drug: Insulin Syringe-Needle U-100 USE  SYRINGE TWICE DAILY   brimonidine 0.15 % ophthalmic solution Commonly known as: ALPHAGAN Place 1 drop into both eyes 2 (two) times daily.   Dexcom G6 Transmitter Misc Use every 3 months   dorzolamide-timolol 2-0.5 % ophthalmic solution Commonly known as: COSOPT Place 1 drop into both eyes 2 (two) times daily.   FreeStyle Libre 2 Reader [place] 1 each by Does not apply route daily.   Dexcom G7 Receiver Devi Use to check blood sugar   FreeStyle Libre 2 Sensor Misc 1 Device by Does not apply route every 14 (fourteen) days.   Dexcom G6 Sensor Misc Change every 10 days   Insulin Pen Needle 32G X 4 MM Misc Use as instructed to administer insulin 4-5X daily. E10.65   Lantus SoloStar 100 UNIT/ML Solostar Pen Generic drug: insulin glargine Inject 24-34 Units into the skin See  admin instructions. 34 units in the morning, 24 units in the evening   lisinopril 5 MG tablet Commonly known as: ZESTRIL Take 1 tablet (5 mg total) by mouth daily.   Multivitamin Adults 50+ Tabs Take 1 tablet by mouth daily.   multivitamin tablet Take 1 tablet by mouth daily.   NovoLOG FlexPen 100 UNIT/ML FlexPen Generic drug: insulin aspart Inject 8-10 Units into the  skin 3 (three) times daily with meals. What changed: how much to take   OneTouch Verio test strip Generic drug: glucose blood 1 each by Other route 2 (two) times daily. And lancets 2/day   potassium chloride 10 MEQ tablet Commonly known as: KLOR-CON Take 1 tablet (10 mEq total) by mouth daily. 10 mEq daily What changed: additional instructions   TYLENOL PO Take 1-2 tablets by mouth daily as needed (pain, headache).        Discharge Exam: Filed Weights   11/29/22 0900  Weight: 93.8 kg  Subjective:  Eyes: PERRL, lids and conjunctivae normal ENMT: Mucous membranes are moist. Posterior pharynx clear of any exudate or lesions.Normal dentition.  Neck: normal, supple, no masses, no thyromegaly Respiratory: clear to auscultation bilaterally, no wheezing, no crackles. Normal respiratory effort. No accessory muscle use.  Cardiovascular: Regular rate and rhythm, no murmurs / rubs / gallops. No extremity edema. 2+ pedal pulses. No carotid bruits.  Abdomen: no tenderness, no masses palpated. No hepatosplenomegaly. Bowel sounds positive.  Musculoskeletal: no clubbing / cyanosis. No joint deformity upper and lower extremities. Good ROM, no contractures. Normal muscle tone.  Skin: no rashes, lesions, ulcers. No induration Neurologic: CN 2-12 grossly intact. Sensation intact, DTR normal.  Slight weakness on left side compared to right side Psychiatric: Normal judgment and insight. Alert and oriented x 3. Normal mood.    Condition at discharge: good  The results of significant diagnostics from this hospitalization (including imaging, microbiology, ancillary and laboratory) are listed below for reference.   Imaging Studies: MR BRAIN WO CONTRAST  Result Date: 11/29/2022 CLINICAL DATA:  Provided history: Transient ischemic attack (TIA). Additional history provided: Acute onset confusion, speech difficulty and unilateral numbness. EXAM: MRI HEAD WITHOUT CONTRAST TECHNIQUE: Multiplanar, multiecho  pulse sequences of the brain and surrounding structures were obtained without intravenous contrast. COMPARISON:  Non-contrast head CT 11/29/2022. CT angiogram head/neck 11/29/2022. FINDINGS: Brain: Mild generalized parenchymal atrophy. Multifocal T2 FLAIR hyperintense signal abnormality within the cerebral white matter, nonspecific but compatible with mild chronic small vessel ischemic disease. There is no acute infarct. No evidence of an intracranial mass. No chronic intracranial blood products. No extra-axial fluid collection. No midline shift. Vascular: Maintained flow voids within the proximal large arterial vessels. Skull and upper cervical spine: No suspicious marrow lesion. Incompletely assessed cervical spondylosis. Sinuses/Orbits: No mass or acute finding within the imaged orbits. Mucous retention cysts measuring up to 2.1 cm, and background mild mucosal thickening, within the right maxillary sinus. Mild mucosal thickening within the left maxillary and bilateral ethmoid sinuses. Other: Trace fluid within the right mastoid air cells. IMPRESSION: 1. No evidence of acute intracranial abnormality. 2. Mild chronic small vessel ischemic changes within the cerebral white matter. 3. Mild generalized parenchymal atrophy. 4. Paranasal sinus disease, as described. Electronically Signed   By: Kellie Simmering D.O.   On: 11/29/2022 14:04   CT HEAD CODE STROKE WO CONTRAST  Result Date: 11/29/2022 CLINICAL DATA:  Code stroke.  Aphasia abnormal gait EXAM: CT ANGIOGRAPHY HEAD AND NECK CT PERFUSION BRAIN TECHNIQUE: Multidetector CT imaging of the head and neck  was performed using the standard protocol during bolus administration of intravenous contrast. Multiplanar CT image reconstructions and MIPs were obtained to evaluate the vascular anatomy. Carotid stenosis measurements (when applicable) are obtained utilizing NASCET criteria, using the distal internal carotid diameter as the denominator. Multiphase CT imaging of the  brain was performed following IV bolus contrast injection. Subsequent parametric perfusion maps were calculated using RAPID software. RADIATION DOSE REDUCTION: This exam was performed according to the departmental dose-optimization program which includes automated exposure control, adjustment of the mA and/or kV according to patient size and/or use of iterative reconstruction technique. CONTRAST:  100 cc Omnipaque 350 COMPARISON:  CT head 03/23/2019 FINDINGS: CT HEAD FINDINGS Brain: There is no acute intracranial hemorrhage, extra-axial fluid collection, or acute infarct. Parenchymal volume is normal for age. The ventricles are normal in size. Gray-white differentiation is preserved. There is a probable small remote lacunar infarct in the left lentiform nucleus. There is no mass lesion. There is no mass effect or midline shift. Vascular: No hyperdense vessel or unexpected calcification. Skull: Normal. Negative for fracture or focal lesion. Sinuses/Orbits: There is mild mucosal thickening in the paranasal sinuses. The globes and orbits are unremarkable. Other: None. ASPECTS [facility] Stroke Program Early CT Score) - Ganglionic level infarction (caudate, lentiform nuclei, internal capsule, insula, M1-M3 cortex): 7 - Supraganglionic infarction (M4-M6 cortex): 3 Total score (0-10 with 10 being normal): 10 CTA NECK FINDINGS Aortic arch: The aortic arch is normal. The origins of the major branch vessels are patent. The subclavian arteries are patent to the level imaged. Right carotid system: The right common, internal, and external carotid arteries are patent with minimal plaque at the bifurcation but no hemodynamically significant stenosis or occlusion. The internal carotid artery is tortuous in the upper neck. There is no dissection or aneurysm. Left carotid system: The left common, internal, and external carotid arteries are patent, with minimal plaque at the bifurcation but no hemodynamically significant stenosis or  occlusion. The internal carotid artery is mildly tortuous in the upper neck. There is no dissection or aneurysm. Vertebral arteries: The vertebral arteries are patent, without hemodynamically significant stenosis or occlusion. There is no dissection or aneurysm. Skeleton: There is no acute osseous abnormality or suspicious osseous lesion. There is no visible canal hematoma. Other neck: The soft tissues of the neck are unremarkable. There is extensive dental disease. Upper chest: The imaged lung apices are clear. Review of the MIP images confirms the above findings CTA HEAD FINDINGS Anterior circulation: The intracranial ICAs are patent with mild calcified plaque but no hemodynamically significant stenosis or occlusion. The bilateral MCAs are patent, without proximal stenosis or occlusion. The bilateral ACAs are patent, without proximal stenosis or occlusion. The left A1 segment is hypoplastic/absent, a normal variant. The anterior communicating artery is normal. There is no aneurysm or AVM. Posterior circulation: The bilateral V4 segments are patent. The basilar artery is patent. The major cerebellar arteries appear patent. The bilateral PCAs are patent, without proximal stenosis or occlusion. Bilateral posterior communicating arteries are identified. There is no aneurysm or AVM. Venous sinuses: Patent. Anatomic variants: None. Review of the MIP images confirms the above findings CT Brain Perfusion Findings: ASPECTS: 10 CBF (<30%) Volume: 49m Perfusion (Tmax>6.0s) volume: 066mMismatch Volume: 39m43mnfarction Location:N/a IMPRESSION: 1. No acute intracranial pathology.  ASPECTS is 10 2. Patent vasculature of the head and neck with no hemodynamically significant stenosis or occlusion. 3. No infarct core or penumbra identified on CT perfusion. Findings of the initial noncontrast head CT communicated  to Dr Cheral Marker via Shea Evans at 9:23 am. CTA findings were discussed via telephone at 9:41 a.m. Electronically Signed   By:  Valetta Mole M.D.   On: 11/29/2022 09:47   CT ANGIO HEAD NECK W WO CM W PERF (CODE STROKE)  Result Date: 11/29/2022 CLINICAL DATA:  Code stroke.  Aphasia abnormal gait EXAM: CT ANGIOGRAPHY HEAD AND NECK CT PERFUSION BRAIN TECHNIQUE: Multidetector CT imaging of the head and neck was performed using the standard protocol during bolus administration of intravenous contrast. Multiplanar CT image reconstructions and MIPs were obtained to evaluate the vascular anatomy. Carotid stenosis measurements (when applicable) are obtained utilizing NASCET criteria, using the distal internal carotid diameter as the denominator. Multiphase CT imaging of the brain was performed following IV bolus contrast injection. Subsequent parametric perfusion maps were calculated using RAPID software. RADIATION DOSE REDUCTION: This exam was performed according to the departmental dose-optimization program which includes automated exposure control, adjustment of the mA and/or kV according to patient size and/or use of iterative reconstruction technique. CONTRAST:  100 cc Omnipaque 350 COMPARISON:  CT head 03/23/2019 FINDINGS: CT HEAD FINDINGS Brain: There is no acute intracranial hemorrhage, extra-axial fluid collection, or acute infarct. Parenchymal volume is normal for age. The ventricles are normal in size. Gray-white differentiation is preserved. There is a probable small remote lacunar infarct in the left lentiform nucleus. There is no mass lesion. There is no mass effect or midline shift. Vascular: No hyperdense vessel or unexpected calcification. Skull: Normal. Negative for fracture or focal lesion. Sinuses/Orbits: There is mild mucosal thickening in the paranasal sinuses. The globes and orbits are unremarkable. Other: None. ASPECTS [facility] Stroke Program Early CT Score) - Ganglionic level infarction (caudate, lentiform nuclei, internal capsule, insula, M1-M3 cortex): 7 - Supraganglionic infarction (M4-M6 cortex): 3 Total score (0-10  with 10 being normal): 10 CTA NECK FINDINGS Aortic arch: The aortic arch is normal. The origins of the major branch vessels are patent. The subclavian arteries are patent to the level imaged. Right carotid system: The right common, internal, and external carotid arteries are patent with minimal plaque at the bifurcation but no hemodynamically significant stenosis or occlusion. The internal carotid artery is tortuous in the upper neck. There is no dissection or aneurysm. Left carotid system: The left common, internal, and external carotid arteries are patent, with minimal plaque at the bifurcation but no hemodynamically significant stenosis or occlusion. The internal carotid artery is mildly tortuous in the upper neck. There is no dissection or aneurysm. Vertebral arteries: The vertebral arteries are patent, without hemodynamically significant stenosis or occlusion. There is no dissection or aneurysm. Skeleton: There is no acute osseous abnormality or suspicious osseous lesion. There is no visible canal hematoma. Other neck: The soft tissues of the neck are unremarkable. There is extensive dental disease. Upper chest: The imaged lung apices are clear. Review of the MIP images confirms the above findings CTA HEAD FINDINGS Anterior circulation: The intracranial ICAs are patent with mild calcified plaque but no hemodynamically significant stenosis or occlusion. The bilateral MCAs are patent, without proximal stenosis or occlusion. The bilateral ACAs are patent, without proximal stenosis or occlusion. The left A1 segment is hypoplastic/absent, a normal variant. The anterior communicating artery is normal. There is no aneurysm or AVM. Posterior circulation: The bilateral V4 segments are patent. The basilar artery is patent. The major cerebellar arteries appear patent. The bilateral PCAs are patent, without proximal stenosis or occlusion. Bilateral posterior communicating arteries are identified. There is no aneurysm or  AVM. Venous  sinuses: Patent. Anatomic variants: None. Review of the MIP images confirms the above findings CT Brain Perfusion Findings: ASPECTS: 10 CBF (<30%) Volume: 756m Perfusion (Tmax>6.0s) volume: 098mMismatch Volume: 56m456mnfarction Location:N/a IMPRESSION: 1. No acute intracranial pathology.  ASPECTS is 10 2. Patent vasculature of the head and neck with no hemodynamically significant stenosis or occlusion. 3. No infarct core or penumbra identified on CT perfusion. Findings of the initial noncontrast head CT communicated to Dr LinCheral Markera AMIShea Evans 9:23 am. CTA findings were discussed via telephone at 9:41 a.m. Electronically Signed   By: PetValetta MoleD.   On: 11/29/2022 09:47    Microbiology: Results for orders placed or performed during the hospital encounter of 03/21/22  MRSA Next Gen by PCR, Nasal     Status: None   Collection Time: 03/22/22  3:23 PM   Specimen: Nasal Mucosa; Nasal Swab  Result Value Ref Range Status   MRSA by PCR Next Gen NOT DETECTED NOT DETECTED Final    Comment: (NOTE) The GeneXpert MRSA Assay (FDA approved for NASAL specimens only), is one component of a comprehensive MRSA colonization surveillance program. It is not intended to diagnose MRSA infection nor to guide or monitor treatment for MRSA infections. Test performance is not FDA approved in patients less than 2 y26ars old. Performed at [facility] [id number]  Labs: CBC: Recent Labs  Lab 11/29/22 [id number]/15/23 0956  WBC  --  5.8  NEUTROABS  --  4.0  HGB 13.9 12.6*  HCT 41.0 36.4*  MCV  --  74.6*  PLT  --  [id number] Metabolic Panel: Recent Labs  Lab 11/29/22 0918 11/29/22 0956  NA 140 136  K 3.8 4.4  CL 110 105  CO2  --  22  GLUCOSE 167* 267*  BUN 19 18  CREATININE 1.30* 1.36*  CALCIUM  --  8.5*   Liver Function Tests: Recent Labs  Lab 11/29/22 0956  AST 20  ALT 16  ALKPHOS 72  BILITOT 0.9  PROT 5.9*  ALBUMIN 3.4*   CBG: Recent  Labs  Lab 11/29/22 0911  GLUCAP 170*    Discharge time spent: less than 30 minutes.  Signed: PinLequita HaltD [facility] 11/29/2022

## 2022-11-29 NOTE — Evaluation (Signed)
Occupational Therapy Evaluation Patient Details Name: Christopher [name] MRN: [id number] DOB: [birth year] Today's Date: 11/29/2022   History of Present Illness 76 yo M adm for acute metabolic encephalopathy likely secondary to hypoglycemia.  PMH includes:Diabetes mellitus type II, GERD (gastroesophageal reflux disease)       Glaucoma       Hyperlipidemia       Hypertension.   Clinical Impression   Patient admitted for the diagnosis above.  PTA he lives at home with his spouse, and remains very active, needing no assist with ADL, iADL, or mobility.  He presents with decreased complex thought and mild unsteadiness, but is needing only generalized supervision for in hall mobility and ADL completion.  OT recommended initial 24 hour stand by assist, which the spouse can provide.  OT will follow if the patient remains in the acute setting to ensure a safe transition home, but no post acute OT is anticipated.        Recommendations for follow up therapy are one component of a multi-disciplinary discharge planning process, led by the attending physician.  Recommendations may be updated based on patient status, additional functional criteria and insurance authorization.   Follow Up Recommendations  No OT follow up     Assistance Recommended at Discharge Intermittent Supervision/Assistance  Patient can return home with the following Assist for transportation;Direct supervision/assist for medications management;Direct supervision/assist for financial management    Functional Status Assessment  Patient has had a recent decline in their functional status and demonstrates the ability to make significant improvements in function in a reasonable and predictable amount of time.  Equipment Recommendations  None recommended by OT    Recommendations for Other Services       Precautions / Restrictions Precautions Precautions: Fall Restrictions Weight Bearing Restrictions: No      Mobility Bed  Mobility Overal bed mobility: Modified Independent                  Transfers Overall transfer level: Needs assistance Equipment used: None Transfers: Sit to/from Stand, Bed to chair/wheelchair/BSC Sit to Stand: Supervision     Step pivot transfers: Supervision            Balance Overall balance assessment: Needs assistance Sitting-balance support: Feet supported Sitting balance-Leahy Scale: Good     Standing balance support: No upper extremity supported Standing balance-Leahy Scale: Fair Standing balance comment: LOB with head turns                           ADL either performed or assessed with clinical judgement   ADL       Grooming: Wash/dry hands;Supervision/safety;Standing           Upper Body Dressing : Supervision/safety;Standing   Lower Body Dressing: Supervision/safety;Sit to/from stand   Toilet Transfer: Sales executive;Ambulation                   Vision Patient Visual Report: No change from baseline       Perception     Praxis      Pertinent Vitals/Pain Pain Assessment Pain Assessment: No/denies pain     Hand Dominance Right   Extremity/Trunk Assessment Upper Extremity Assessment Upper Extremity Assessment: Overall WFL for tasks assessed   Lower Extremity Assessment Lower Extremity Assessment: Overall WFL for tasks assessed   Cervical / Trunk Assessment Cervical / Trunk Assessment: Normal   Communication Communication Communication: No difficulties   Cognition Arousal/Alertness: Awake/alert Behavior During Therapy:  WFL for tasks assessed/performed Overall Cognitive Status: Impaired/Different from baseline Area of Impairment: Orientation, Attention, Memory, Following commands, Safety/judgement, Awareness, Problem solving                 Orientation Level: Disoriented to, Situation, Time, Place Current Attention Level: Selective Memory: Decreased short-term memory Following  Commands: Follows one step commands with increased time   Awareness: Emergent Problem Solving: Slow processing, Difficulty sequencing       General Comments   VSS on RA    Exercises     Shoulder Instructions      Home Living Family/patient expects to be discharged to:: Private residence Living Arrangements: Spouse/significant other;Parent Available Help at Discharge: Family;Available 24 hours/day Type of Home: House Home Access: Level entry     Home Layout: One level     Bathroom Shower/Tub: Walk-in shower;Tub/shower unit   Bathroom Toilet: Standard Bathroom Accessibility: Yes How Accessible: Accessible via walker Home Equipment: Pollard - single point;Rolling Walker (2 wheels);BSC/3in1          Prior Functioning/Environment Prior Level of Function : Independent/Modified Independent;Driving             Mobility Comments: no assistive device ADLs Comments: Ind with ADL andiADL        OT Problem List: Decreased cognition;Impaired balance (sitting and/or standing)      OT Treatment/Interventions:      OT Goals(Current goals can be found in the care plan section) Acute Rehab OT Goals Patient Stated Goal: Hoping to return home OT Goal Formulation: With patient Time For Goal Achievement: 12/02/22 Potential to Achieve Goals: Good  OT Frequency:      Co-evaluation              AM-PAC OT "6 Clicks" Daily Activity     Outcome Measure Help from another person eating meals?: None Help from another person taking care of personal grooming?: None Help from another person toileting, which includes using toliet, bedpan, or urinal?: A Little Help from another person bathing (including washing, rinsing, drying)?: A Little Help from another person to put on and taking off regular upper body clothing?: None Help from another person to put on and taking off regular lower body clothing?: A Little 6 Click Score: 21   End of Session Equipment Utilized During  Treatment: Gait belt Nurse Communication: Mobility status  Activity Tolerance: Patient tolerated treatment well Patient left: in bed;with call bell/phone within reach  OT Visit Diagnosis: Unsteadiness on feet (R26.81);Other symptoms and signs involving cognitive function                Time: 9675-9163 OT Time Calculation (min): 25 min Charges:  OT General Charges $OT Visit: 1 Visit OT Evaluation $OT Eval Moderate Complexity: 1 Mod OT Treatments $Self Care/Home Management : 8-22 mins  11/29/2022  RP, OTR/L  Acute Rehabilitation Services  Office:  [phone]   Metta Clines 11/29/2022, 4:25 PM

## 2022-11-29 NOTE — ED Triage Notes (Signed)
Patient bib [facility] from home with complaints of not acting normal wife called ems saying his BG was 50 she gave orange juice BG for ems was 232 He was slurring words with a shuffling gait VSS

## 2022-11-29 NOTE — Code Documentation (Signed)
Stroke Response Nurse Documentation Code Documentation  Christopher [name] is a 76 y.o. male arriving to [facility]  via [facility] on 11/29/2022 with past medical hx of HTN, HLP, DIabetes. Code stroke was activated by EMS.   Patient from home where he was LKW at 2030 last night when he went to bed and now complaining of trouble speaking. PT called out to his wife this morning and she noted that he was slurring his words. She checked his sugar and it was noted to be 50. Family gave patient OG and they rechecked it to be above 70. The patient continued to have trouble speaking and they called EMS. EMS arrived and noted aphasia. Activated a Code Stroke.  Stroke team at the bedside on patient arrival. Labs drawn and patient cleared for CT by Dr. Marjie Skiff. Patient to CT with team. NIHSS 4, see documentation for details and code stroke times. Patient with disoriented, Expressive aphasia , dysarthria , and Visual  neglect on exam. The following imaging was completed:  CT Head, CTA, and CTP. Patient is not a candidate for IV Thrombolytic due to being outside window. Patient is not a candidate for IR due to no LVO noted on CTA per radiologist.   Care Plan: q2 NIHSS/VS x 12 hours and then q4.   Bedside handoff with ED RN Amber.    Kathrin Greathouse  Stroke Response RN

## 2022-11-29 NOTE — ED Notes (Signed)
Physical therapy at bedside

## 2022-11-29 NOTE — Consult Note (Signed)
NEUROLOGY CONSULTATION NOTE   Date of service: November 29, 2022 Patient Name: Christopher [name] MRN:  [id number] DOB:  [birth year] Reason for consult: "Code Stroke" Requesting Provider: Audley Hose, MD _ _ _   _ __   _ __ _ _  __ __   _ __   __ _  History of Present Illness  Christopher [name] is a 76 y.o. male with PMH significant for DM2, GERD, Glaucoma, Hyperlipidemia, Hypertension who presents as a Code Stroke.  Patient's LKW is 12/14 2030 before going to bed. Patient now c/o trouble speaking and was slurring his words at home, per wife. Wife checked his sugar at this time and it was 50, juice was given, recheck of sugar was 70. Patient continued to have trouble speaking and his wife called EMS. Code stroke was activated by EMS due to aphasia.  On arrival to ED, patient continued to exhibit aphasia, dysarthria and confusion. NIH 4.He was able to state correct month, but not his age or birthday. He did not show signs of weakness in any extremity and was able to follow all commands. VAN negative. No facial droop. Coordination was intact, with patient performing the finger-nose test slowly, but accurately. Right side visual neglect (right temporal visual field) was seen on clinical exam while he was in CT.   Imaging completed: CT head (Negative), CTA (no LVO), CTP (Negative).    Past History   Past Medical History:  Diagnosis Date   Diabetes mellitus type II    GERD (gastroesophageal reflux disease)    Glaucoma    Hyperlipidemia    Hypertension    Past Surgical History:  Procedure Laterality Date   COLONOSCOPY     HERNIA REPAIR     PATELLA FRACTURE SURGERY  1993   Family History  Problem Relation Age of Onset   Alcohol abuse Father    Diabetes Sister    Colon cancer Neg Hx    Esophageal cancer Neg Hx    Stomach cancer Neg Hx    Rectal cancer Neg Hx    Social History   Socioeconomic History   Marital status: Married    Spouse name: Not on file   Number of children: Not on  file   Years of education: Not on file   Highest education level: Not on file  Occupational History   Not on file  Tobacco Use   Smoking status: Never   Smokeless tobacco: Never  Vaping Use   Vaping Use: Never used  Substance and Sexual Activity   Alcohol use: No   Drug use: No   Sexual activity: Not on file  Other Topics Concern   Not on file  Social History Narrative   Not on file   Social Determinants of Health   Financial Resource Strain: Not on file  Food Insecurity: No Food Insecurity (10/06/2022)   Hunger Vital Sign    Worried About Running Out of Food in the Last Year: Never true    Ran Out of Food in the Last Year: Never true  Transportation Needs: No Transportation Needs (10/06/2022)   PRAPARE - Hydrologist (Medical): No    Lack of Transportation (Non-Medical): No  Physical Activity: Sufficiently Active (05/10/2020)   Exercise Vital Sign    Days of Exercise per Week: 5 days    Minutes of Exercise per Session: 60 min  Stress: Not on file  Social Connections: Not on file   No Known Allergies  Medications    No current facility-administered medications on file prior to encounter.   Current Outpatient Medications on File Prior to Encounter  Medication Sig Dispense Refill   Continuous Blood Gluc Receiver (DEXCOM G7 RECEIVER) DEVI Use to check blood sugar 1 each 0   Continuous Blood Gluc Sensor (DEXCOM G6 SENSOR) MISC Change every 10 days 9 each 2   Continuous Blood Gluc Transmit (DEXCOM G6 TRANSMITTER) MISC Use every 3 months 1 each 2   Insulin Pen Needle 32G X 4 MM MISC Use as instructed to administer insulin 4-5X daily. E10.65 400 each 1   Acetaminophen (TYLENOL PO) Take 1-2 tablets by mouth daily as needed (pain, headache).     aspirin (ASPIRIN CHILDRENS) 81 MG chewable tablet Chew 1 tablet (81 mg total) by mouth daily. 90 tablet 0   atorvastatin (LIPITOR) 80 MG tablet Take 1 tablet (80 mg total) by mouth daily. 90 tablet 1    brimonidine (ALPHAGAN) 0.15 % ophthalmic solution Place 1 drop into both eyes 2 (two) times daily.     Continuous Blood Gluc Receiver (FREESTYLE LIBRE 2 READER) DEVI 1 each by Does not apply route daily. 4 each 2   Continuous Blood Gluc Sensor (FREESTYLE LIBRE 2 SENSOR) MISC 1 Device by Does not apply route every 14 (fourteen) days. 6 each 3   dorzolamide-timolol (COSOPT) 22.3-6.8 MG/ML ophthalmic solution Place 1 drop into both eyes 2 (two) times daily.      glucagon 1 MG injection Inject 1 mg into the muscle once as needed. (Patient taking differently: Inject 1 mg into the muscle as needed (low blood sugar).) 1 each 12   glucose blood (ONETOUCH VERIO) test strip 1 each by Other route 2 (two) times daily. And lancets 2/day 200 each 3   insulin aspart (NOVOLOG FLEXPEN) 100 UNIT/ML FlexPen Inject 8-10 Units into the skin 3 (three) times daily with meals. (Patient taking differently: Inject 8 Units into the skin 3 (three) times daily with meals.) 15 mL 1   insulin glargine (LANTUS SOLOSTAR) 100 UNIT/ML Solostar Pen Inject 24-34 Units into the skin See admin instructions. 34 units in the morning, 24 units in the evening     Insulin Syringe-Needle U-100 (BD VEO INSULIN SYRINGE U/F) 31G X 15/64" 1 ML MISC USE  SYRINGE TWICE DAILY 200 each 5   lisinopril (ZESTRIL) 5 MG tablet Take 1 tablet (5 mg total) by mouth daily. 90 tablet 1   Multiple Vitamin (MULTIVITAMIN) tablet Take 1 tablet by mouth daily. (Patient not taking: Reported on 10/08/2022)     Multiple Vitamins-Minerals (MULTIVITAMIN ADULTS 50+) TABS Take 1 tablet by mouth daily.     potassium chloride (KLOR-CON) 10 MEQ tablet Take 1 tablet (10 mEq total) by mouth daily. 10 mEq daily (Patient taking differently: Take 10 mEq by mouth daily.) 30 tablet 2     Vitals   Vitals:   11/29/22 0900 11/29/22 0917 11/29/22 0930 11/29/22 0945  BP:  (!) 166/92 (!) 166/92 (!) 146/84  Pulse:  84 81 83  Resp:    15  Temp:  98.2 F (36.8 C)    TempSrc:  Oral     SpO2:  100%  99%  Weight: 93.8 kg        Body mass index is 25.85 kg/m.  Physical Exam   General: Laying on stretcher. No acute distress.  HENT: Normal external appearance of ears and nose.  CV: WNL HR. Monitored. No peripheral edema.  Pulmonary: Normal respiratory effort. WNL RR.  Ext:  No cyanosis, edema, or deformity  Skin: No rash. No open wounds seen.    Musculoskeletal: Normal digits and nails by inspection.   Neurologic Examination  Mental status/Cognition: Alert to self and month.  Speech/language: Aphasia and dysarthria  Cranial nerves:  Pupils equal and reactive. R side extinction seen on exam.  No gaze preference.  Bilateral sensation to touch and temperature. No droop or asymmetry. Normal hearing.  Bilateral shoulder shrug and head turn Midline tongue protrusion Motor:  No pronator drift seen in any extremity. No tremor.  Reflexes: Upper extremity bracho-radialis and bicep: +1. Left patellar: 0 Right patellar: +1. Upgoing toes seen bilaterally.  Sensation: Sensation intact bilaterally to light touch and temperature without asymmetry Coordination/Complex Motor:  - Finger to Nose: slow, but accurate - Heel to shin: slow, but accurate Gait: deferred for patient safety.   Labs   CBC:  Recent Labs  Lab 11/29/22 0918 11/29/22 0956  WBC  --  5.8  NEUTROABS  --  4.0  HGB 13.9 12.6*  HCT 41.0 36.4*  MCV  --  74.6*  PLT  --  916    Basic Metabolic Panel:  Lab Results  Component Value Date   NA 140 11/29/2022   K 3.8 11/29/2022   CO2 22 11/01/2022   GLUCOSE 167 (H) 11/29/2022   BUN 19 11/29/2022   CREATININE 1.30 (H) 11/29/2022   CALCIUM 9.2 11/01/2022   GFRNONAA 52 (L) 11/01/2022   GFRAA 53 (L) 06/26/2018   Lipid Panel:  Lab Results  Component Value Date   LDLCALC 88 05/20/2022   HgbA1c:  Lab Results  Component Value Date   HGBA1C 10.1 (H) 08/12/2022   Urine Drug Screen:     Component Value Date/Time   LABOPIA NONE DETECTED 03/22/2022  0110   COCAINSCRNUR NONE DETECTED 03/22/2022 0110   LABBENZ NONE DETECTED 03/22/2022 0110   AMPHETMU NONE DETECTED 03/22/2022 0110   THCU NONE DETECTED 03/22/2022 0110   LABBARB NONE DETECTED 03/22/2022 0110    Alcohol Level     Component Value Date/Time   ETH <10 06/26/2018 2136    CT Head without contrast(Personally reviewed): 1. No acute intracranial pathology.  ASPECTS is 10 2. Patent vasculature of the head and neck with no hemodynamically significant stenosis or occlusion. 3. No infarct core or penumbra identified on CT perfusion.  CT angio Head and Neck with contrast(Personally reviewed): 1. No acute intracranial pathology.  ASPECTS is 10 2. Patent vasculature of the head and neck with no hemodynamically significant stenosis or occlusion. 3. No infarct core or penumbra identified on CT perfusion.    Impression   Christopher [name] is a 76 y.o. male with PMH significant for for DM2, GERD, Glaucoma, Hyperlipidemia, Hypertension who presents as a Code Stroke. Marland Kitchen His neurologic examination is notable for aphasia, dysarthria and confusion. CT, CTA, CTP all negative. No TNK given due to being outside of the time window.   - Primary Diagnosis:  TIA versus hypoglycemic event with slowly resolving confusion and dysphasia - Secondary Diagnoses: Type 2 DM with hypoglycemia Essential Hypertension  Recommendations   - Frequent Neuro checks per stroke unit protocol - MRI Brain stroke protocol - TTE - Lipid panel - Statin - will be started if LDL>70 or otherwise medically indicated - A1C - Antithrombotic - Continue home ASA, add Plavix for DAPT. Can consider stopping Plavix if stroke work up shows no structural abnormalities predisposing to stroke.  - DVT ppx - SCDs  - SBP goal - <220, PRN  labetalol if HR>60 and PRN Hydralazine if HR<60 - Telemetry monitoring for arrhythmia - 72h - Swallow screen - will be performed prior to PO intake - Stroke education - will be given -  PT/OT/SLP - Dispo: Recommend Stroke Unit for frequent neuro checks ______________________________________________________________________   Vivi Ferns, DNP, AGACNP-BC [facility] Please see AMION for pager information  I have seen and examined the patient. I have formulated the assessment and recommendations. 76 year old male with TIA symptoms. Dysphasia and confusion on exam. NIHSS 4. Recommendations as above.  Electronically signed: Dr. Kerney Elbe

## 2022-11-29 NOTE — Progress Notes (Signed)
Responded to code stroke page to support patient and staff.  Pt going to CT.  Chaplain provided emotional and spiritual support. Chaplain available as needed.  Jaclynn Major, [place], [facility], Pager [phone]

## 2022-11-29 NOTE — ED Notes (Signed)
Patient being transported to MRI

## 2022-11-29 NOTE — H&P (Signed)
History and Physical    Christopher [name] PTW:[id number] DOB: 10/21/1946 DOA: 11/29/2022  PCP: Christopher [name], Christopher Halsted, MD (Confirm with patient/family/NH records and if not entered, this has to be entered at [facility] point of entry) Patient coming from: Home  I have personally briefly reviewed patient's old medical records in [place]  Chief Complaint: Feeling better  HPI: Christopher [name] is a 76 y.o. male with medical history significant of HTN, IDDM, HLD, GERD, glaucoma, presented with acute onset of speech problem.  Symptoms started suddenly this morning around 730, communication, patient suddenly became confused and talking gibberish, and patient looked sweaty and pale.  Wife checked his glucose was 50.  A couple of orange juice given and glucose recovered to 77 however patient remained in slurred speech throughout.  Wife called EMS, EMS arrived and patient was given D50 and glucose recovered to 277 on arrival in the ED.  Patient remembered that he had one-sided numbness this morning but could not tell me which side.  He reported did not have trouble understanding wife's talking during the episode.  No history of MI stroke, A-fib before he is a non-smoker.  No recent insulin regimen changes in last 6 months, when his nighttime Humalog was decreased.  Wife reported several episodes of low glucose reading in the morning however never as low as today's.  ED Course: Code stroke called in the ED afebrile, no tachycardia blood pressure 145/90, not hypoxic.  CT head and CT angiogram head and neck negative for acute stroke or significant stenosis.  Glucose normalized to 67, creatinine 1.3, K4.4.  Review of Systems: As per HPI otherwise 14 point review of systems negative.    Past Medical History:  Diagnosis Date   Diabetes mellitus type II    GERD (gastroesophageal reflux disease)    Glaucoma    Hyperlipidemia    Hypertension     Past Surgical History:  Procedure Laterality Date    COLONOSCOPY     HERNIA REPAIR     [place]     reports that he has never smoked. He has never used smokeless tobacco. He reports that he does not drink alcohol and does not use drugs.  No Known Allergies  Family History  Problem Relation Age of Onset   Alcohol abuse Father    Diabetes Sister    Colon cancer Neg Hx    Esophageal cancer Neg Hx    Stomach cancer Neg Hx    Rectal cancer Neg Hx      Prior to Admission medications   Medication Sig Start Date End Date Taking? Authorizing Provider  Continuous Blood Gluc Receiver (DEXCOM G7 RECEIVER) DEVI Use to check blood sugar 11/19/22   Elayne Snare, MD  Continuous Blood Gluc Sensor (DEXCOM G6 SENSOR) MISC Change every 10 days 11/19/22   Elayne Snare, MD  Continuous Blood Gluc Transmit (DEXCOM G6 TRANSMITTER) MISC Use every 3 months 11/19/22   Elayne Snare, MD  Insulin Pen Needle 32G X 4 MM MISC Use as instructed to administer insulin 4-5X daily. E10.65 10/29/22   Elayne Snare, MD  Acetaminophen (TYLENOL PO) Take 1-2 tablets by mouth daily as needed (pain, headache).    [provider]  aspirin (ASPIRIN CHILDRENS) 81 MG chewable tablet Chew 1 tablet (81 mg total) by mouth daily. 10/07/22 01/05/23  Vashti Hey, MD  atorvastatin (LIPITOR) 80 MG tablet Take 1 tablet (80 mg total) by mouth daily. 03/25/22   Little Ishikawa,  MD  brimonidine (ALPHAGAN) 0.15 % ophthalmic solution Place 1 drop into both eyes 2 (two) times daily.    [provider]  Continuous Blood Gluc Receiver (FREESTYLE LIBRE 2 READER) DEVI 1 each by Does not apply route daily. 04/19/22   Renato Shin, MD  Continuous Blood Gluc Sensor (FREESTYLE LIBRE 2 SENSOR) MISC 1 Device by Does not apply route every 14 (fourteen) days. 04/03/22   Christopher [name], Christopher Halsted, MD  dorzolamide-timolol (COSOPT) 22.3-6.8 MG/ML ophthalmic solution Place 1 drop into both eyes 2 (two) times daily.  03/03/11   [provider]  glucagon 1 MG  injection Inject 1 mg into the muscle once as needed. Patient taking differently: Inject 1 mg into the muscle as needed (low blood sugar). 03/12/16   Renato Shin, MD  glucose blood [facility] VERIO) test strip 1 each by Other route 2 (two) times daily. And lancets 2/day 04/19/22   Renato Shin, MD  insulin aspart (NOVOLOG FLEXPEN) 100 UNIT/ML FlexPen Inject 8-10 Units into the skin 3 (three) times daily with meals. Patient taking differently: Inject 8 Units into the skin 3 (three) times daily with meals. 08/14/22   Elayne Snare, MD  insulin glargine (LANTUS SOLOSTAR) 100 UNIT/ML Solostar Pen Inject 24-34 Units into the skin See admin instructions. 34 units in the morning, 24 units in the evening    [provider]  Insulin Syringe-Needle U-100 (BD VEO INSULIN SYRINGE U/F) 31G X 15/64" 1 ML MISC USE  SYRINGE TWICE DAILY 07/29/22   Elayne Snare, MD  lisinopril (ZESTRIL) 5 MG tablet Take 1 tablet (5 mg total) by mouth daily. 05/15/22   Christopher [name], Christopher Halsted, MD  Multiple Vitamin (MULTIVITAMIN) tablet Take 1 tablet by mouth daily. Patient not taking: Reported on 10/08/2022    [provider]  Multiple Vitamins-Minerals (MULTIVITAMIN ADULTS 50+) TABS Take 1 tablet by mouth daily.    [provider]  potassium chloride (KLOR-CON) 10 MEQ tablet Take 1 tablet (10 mEq total) by mouth daily. 10 mEq daily Patient taking differently: Take 10 mEq by mouth daily. 08/20/22   Elayne Snare, MD    Physical Exam: Vitals:   11/29/22 1130 11/29/22 1200 11/29/22 1215 11/29/22 1230  BP: (!) 156/90 (!) 149/92 (!) 140/89 (!) 145/90  Pulse: 74 71 68 68  Resp: '15 18 17 17  '$ Temp:      TempSrc:      SpO2: 100% 100% 100% 99%  Weight:        Constitutional: NAD, calm, comfortable Vitals:   11/29/22 1130 11/29/22 1200 11/29/22 1215 11/29/22 1230  BP: (!) 156/90 (!) 149/92 (!) 140/89 (!) 145/90  Pulse: 74 71 68 68  Resp: '15 18 17 17  '$ Temp:      TempSrc:      SpO2: 100% 100% 100% 99%   Weight:       Eyes: PERRL, lids and conjunctivae normal ENMT: Mucous membranes are moist. Posterior pharynx clear of any exudate or lesions.Normal dentition.  Neck: normal, supple, no masses, no thyromegaly Respiratory: clear to auscultation bilaterally, no wheezing, no crackles. Normal respiratory effort. No accessory muscle use.  Cardiovascular: Regular rate and rhythm, no murmurs / rubs / gallops. No extremity edema. 2+ pedal pulses. No carotid bruits.  Abdomen: no tenderness, no masses palpated. No hepatosplenomegaly. Bowel sounds positive.  Musculoskeletal: no clubbing / cyanosis. No joint deformity upper and lower extremities. Good ROM, no contractures. Normal muscle tone.  Skin: no rashes, lesions, ulcers. No induration Neurologic: CN 2-12 grossly intact.  Sensation intact, DTR normal. Strength 5/5 in all 4.  Speech is somewhat slow compared to baseline as per wife Psychiatric: Normal judgment and insight. Alert and oriented x 3. Normal mood.     Labs on Admission: I have personally reviewed following labs and imaging studies  CBC: Recent Labs  Lab 11/29/22 0918 11/29/22 0956  WBC  --  5.8  NEUTROABS  --  4.0  HGB 13.9 12.6*  HCT 41.0 36.4*  MCV  --  74.6*  PLT  --  893   Basic Metabolic Panel: Recent Labs  Lab 11/29/22 0918 11/29/22 0956  NA 140 136  K 3.8 4.4  CL 110 105  CO2  --  22  GLUCOSE 167* 267*  BUN 19 18  CREATININE 1.30* 1.36*  CALCIUM  --  8.5*   GFR: Estimated Creatinine Clearance: 56.1 mL/min (A) (by C-G formula based on SCr of 1.36 mg/dL (H)). Liver Function Tests: Recent Labs  Lab 11/29/22 0956  AST 20  ALT 16  ALKPHOS 72  BILITOT 0.9  PROT 5.9*  ALBUMIN 3.4*   No results for input(s): "LIPASE", "AMYLASE" in the last 168 hours. No results for input(s): "AMMONIA" in the last 168 hours. Coagulation Profile: Recent Labs  Lab 11/29/22 0956  INR 1.2   Cardiac Enzymes: No results for input(s): "CKTOTAL", "CKMB", "CKMBINDEX",  "TROPONINI" in the last 168 hours. BNP (last 3 results) No results for input(s): "PROBNP" in the last 8760 hours. HbA1C: No results for input(s): "HGBA1C" in the last 72 hours. CBG: Recent Labs  Lab 11/29/22 0911  GLUCAP 170*   Lipid Profile: No results for input(s): "CHOL", "HDL", "LDLCALC", "TRIG", "CHOLHDL", "LDLDIRECT" in the last 72 hours. Thyroid Function Tests: No results for input(s): "TSH", "T4TOTAL", "FREET4", "T3FREE", "THYROIDAB" in the last 72 hours. Anemia Panel: No results for input(s): "VITAMINB12", "FOLATE", "FERRITIN", "TIBC", "IRON", "RETICCTPCT" in the last 72 hours. Urine analysis:    Component Value Date/Time   COLORURINE YELLOW 11/29/2022 [place] 11/29/2022 1052   LABSPEC 1.037 (H) 11/29/2022 1052   PHURINE 7.0 11/29/2022 1052   GLUCOSEU >=500 (A) 11/29/2022 1052   GLUCOSEU 500 08/25/2013 1450   HGBUR NEGATIVE 11/29/2022 1052   HGBUR negative 02/06/2009 [place] 11/29/2022 1052   BILIRUBINUR N 03/25/2017 1053   KETONESUR NEGATIVE 11/29/2022 1052   PROTEINUR NEGATIVE 11/29/2022 1052   UROBILINOGEN 0.2 03/25/2017 1053   UROBILINOGEN 0.2 08/25/2013 1450   NITRITE NEGATIVE 11/29/2022 1052   LEUKOCYTESUR NEGATIVE 11/29/2022 1052    Radiological Exams on Admission: CT HEAD CODE STROKE WO CONTRAST  Result Date: 11/29/2022 CLINICAL DATA:  Code stroke.  Aphasia abnormal gait EXAM: CT ANGIOGRAPHY HEAD AND NECK CT PERFUSION BRAIN TECHNIQUE: Multidetector CT imaging of the head and neck was performed using the standard protocol during bolus administration of intravenous contrast. Multiplanar CT image reconstructions and MIPs were obtained to evaluate the vascular anatomy. Carotid stenosis measurements (when applicable) are obtained utilizing NASCET criteria, using the distal internal carotid diameter as the denominator. Multiphase CT imaging of the brain was performed following IV bolus contrast injection. Subsequent parametric  perfusion maps were calculated using RAPID software. RADIATION DOSE REDUCTION: This exam was performed according to the departmental dose-optimization program which includes automated exposure control, adjustment of the mA and/or kV according to patient size and/or use of iterative reconstruction technique. CONTRAST:  100 cc Omnipaque 350 COMPARISON:  CT head 03/23/2019 FINDINGS: CT HEAD FINDINGS Brain: There is no acute intracranial hemorrhage, extra-axial fluid  collection, or acute infarct. Parenchymal volume is normal for age. The ventricles are normal in size. Gray-white differentiation is preserved. There is a probable small remote lacunar infarct in the left lentiform nucleus. There is no mass lesion. There is no mass effect or midline shift. Vascular: No hyperdense vessel or unexpected calcification. Skull: Normal. Negative for fracture or focal lesion. Sinuses/Orbits: There is mild mucosal thickening in the paranasal sinuses. The globes and orbits are unremarkable. Other: None. ASPECTS [facility] Stroke Program Early CT Score) - Ganglionic level infarction (caudate, lentiform nuclei, internal capsule, insula, M1-M3 cortex): 7 - Supraganglionic infarction (M4-M6 cortex): 3 Total score (0-10 with 10 being normal): 10 CTA NECK FINDINGS Aortic arch: The aortic arch is normal. The origins of the major branch vessels are patent. The subclavian arteries are patent to the level imaged. Right carotid system: The right common, internal, and external carotid arteries are patent with minimal plaque at the bifurcation but no hemodynamically significant stenosis or occlusion. The internal carotid artery is tortuous in the upper neck. There is no dissection or aneurysm. Left carotid system: The left common, internal, and external carotid arteries are patent, with minimal plaque at the bifurcation but no hemodynamically significant stenosis or occlusion. The internal carotid artery is mildly tortuous in the upper neck. There  is no dissection or aneurysm. Vertebral arteries: The vertebral arteries are patent, without hemodynamically significant stenosis or occlusion. There is no dissection or aneurysm. Skeleton: There is no acute osseous abnormality or suspicious osseous lesion. There is no visible canal hematoma. Other neck: The soft tissues of the neck are unremarkable. There is extensive dental disease. Upper chest: The imaged lung apices are clear. Review of the MIP images confirms the above findings CTA HEAD FINDINGS Anterior circulation: The intracranial ICAs are patent with mild calcified plaque but no hemodynamically significant stenosis or occlusion. The bilateral MCAs are patent, without proximal stenosis or occlusion. The bilateral ACAs are patent, without proximal stenosis or occlusion. The left A1 segment is hypoplastic/absent, a normal variant. The anterior communicating artery is normal. There is no aneurysm or AVM. Posterior circulation: The bilateral V4 segments are patent. The basilar artery is patent. The major cerebellar arteries appear patent. The bilateral PCAs are patent, without proximal stenosis or occlusion. Bilateral posterior communicating arteries are identified. There is no aneurysm or AVM. Venous sinuses: Patent. Anatomic variants: None. Review of the MIP images confirms the above findings CT Brain Perfusion Findings: ASPECTS: 10 CBF (<30%) Volume: 55m Perfusion (Tmax>6.0s) volume: 028mMismatch Volume: 85m13mnfarction Location:N/a IMPRESSION: 1. No acute intracranial pathology.  ASPECTS is 10 2. Patent vasculature of the head and neck with no hemodynamically significant stenosis or occlusion. 3. No infarct core or penumbra identified on CT perfusion. Findings of the initial noncontrast head CT communicated to Dr LinCheral Markera AMIShea Evans 9:23 am. CTA findings were discussed via telephone at 9:41 a.m. Electronically Signed   By: PetValetta MoleD.   On: 11/29/2022 09:47   CT ANGIO HEAD NECK W WO CM W PERF (CODE  STROKE)  Result Date: 11/29/2022 CLINICAL DATA:  Code stroke.  Aphasia abnormal gait EXAM: CT ANGIOGRAPHY HEAD AND NECK CT PERFUSION BRAIN TECHNIQUE: Multidetector CT imaging of the head and neck was performed using the standard protocol during bolus administration of intravenous contrast. Multiplanar CT image reconstructions and MIPs were obtained to evaluate the vascular anatomy. Carotid stenosis measurements (when applicable) are obtained utilizing NASCET criteria, using the distal internal carotid diameter as the denominator. Multiphase CT imaging of the  brain was performed following IV bolus contrast injection. Subsequent parametric perfusion maps were calculated using RAPID software. RADIATION DOSE REDUCTION: This exam was performed according to the departmental dose-optimization program which includes automated exposure control, adjustment of the mA and/or kV according to patient size and/or use of iterative reconstruction technique. CONTRAST:  100 cc Omnipaque 350 COMPARISON:  CT head 03/23/2019 FINDINGS: CT HEAD FINDINGS Brain: There is no acute intracranial hemorrhage, extra-axial fluid collection, or acute infarct. Parenchymal volume is normal for age. The ventricles are normal in size. Gray-white differentiation is preserved. There is a probable small remote lacunar infarct in the left lentiform nucleus. There is no mass lesion. There is no mass effect or midline shift. Vascular: No hyperdense vessel or unexpected calcification. Skull: Normal. Negative for fracture or focal lesion. Sinuses/Orbits: There is mild mucosal thickening in the paranasal sinuses. The globes and orbits are unremarkable. Other: None. ASPECTS [facility] Stroke Program Early CT Score) - Ganglionic level infarction (caudate, lentiform nuclei, internal capsule, insula, M1-M3 cortex): 7 - Supraganglionic infarction (M4-M6 cortex): 3 Total score (0-10 with 10 being normal): 10 CTA NECK FINDINGS Aortic arch: The aortic arch is normal.  The origins of the major branch vessels are patent. The subclavian arteries are patent to the level imaged. Right carotid system: The right common, internal, and external carotid arteries are patent with minimal plaque at the bifurcation but no hemodynamically significant stenosis or occlusion. The internal carotid artery is tortuous in the upper neck. There is no dissection or aneurysm. Left carotid system: The left common, internal, and external carotid arteries are patent, with minimal plaque at the bifurcation but no hemodynamically significant stenosis or occlusion. The internal carotid artery is mildly tortuous in the upper neck. There is no dissection or aneurysm. Vertebral arteries: The vertebral arteries are patent, without hemodynamically significant stenosis or occlusion. There is no dissection or aneurysm. Skeleton: There is no acute osseous abnormality or suspicious osseous lesion. There is no visible canal hematoma. Other neck: The soft tissues of the neck are unremarkable. There is extensive dental disease. Upper chest: The imaged lung apices are clear. Review of the MIP images confirms the above findings CTA HEAD FINDINGS Anterior circulation: The intracranial ICAs are patent with mild calcified plaque but no hemodynamically significant stenosis or occlusion. The bilateral MCAs are patent, without proximal stenosis or occlusion. The bilateral ACAs are patent, without proximal stenosis or occlusion. The left A1 segment is hypoplastic/absent, a normal variant. The anterior communicating artery is normal. There is no aneurysm or AVM. Posterior circulation: The bilateral V4 segments are patent. The basilar artery is patent. The major cerebellar arteries appear patent. The bilateral PCAs are patent, without proximal stenosis or occlusion. Bilateral posterior communicating arteries are identified. There is no aneurysm or AVM. Venous sinuses: Patent. Anatomic variants: None. Review of the MIP images  confirms the above findings CT Brain Perfusion Findings: ASPECTS: 10 CBF (<30%) Volume: 56m Perfusion (Tmax>6.0s) volume: 043mMismatch Volume: 78m54mnfarction Location:N/a IMPRESSION: 1. No acute intracranial pathology.  ASPECTS is 10 2. Patent vasculature of the head and neck with no hemodynamically significant stenosis or occlusion. 3. No infarct core or penumbra identified on CT perfusion. Findings of the initial noncontrast head CT communicated to Dr LinCheral Markera AMIShea Evans 9:23 am. CTA findings were discussed via telephone at 9:41 a.m. Electronically Signed   By: PetValetta MoleD.   On: 11/29/2022 09:47    EKG: Independently reviewed.  Sinus rhythm, no acute ST changes.  Assessment/Plan Principal Problem:  TIA (transient ischemic attack) Active Problems:   HLD (hyperlipidemia)   Hypertension   Hypoglycemia  (please populate well all problems here in Problem List. (For example, if patient is on BP meds at home and you resume or decide to hold them, it is a problem that needs to be her. Same for CAD, COPD, HLD and so on)  Acute expressive aphasia vs acute metabolic encephalopathy -Differential can be TIA versus profound hypoglycemia -As recommended by neurology, brain MRI ordered.  And started patient on duo antiplatelet treatment of aspirin and Plavix regimen -Hypoglycemic encephalopathy remains a strong differential, as patient appears to have similar but not as severe hypoglycemia episode recently.  Check A1c, hold off Lantus, start patient on sliding scale for now. -Speech evaluation  HTN -Hold off home BP meds to allow permissive HTN for now, until MRI resulted  IDDM with hypoglycemia -As above  HLD -Continue Statin  DVT prophylaxis: Lovenox Code Status: Full code Family Communication: Wife at bedside Disposition Plan: Expect less than 2 midnight hospital stay Consults called: Neurology Admission status: Tele obs   Lequita Halt MD [facility] Pager  [phone]  11/29/2022, 12:49 PM

## 2022-11-29 NOTE — Telephone Encounter (Signed)
Wife called in patient blood sugar was really low this morning. Elenor Legato was in red. She gave orange juice and food. Came up to 77 but patient is not able to talk. Spoke with Dr Dwyane Dee he states to call EMS. Wife will do that now.

## 2022-11-29 NOTE — ED Provider Notes (Signed)
[facility] EMERGENCY DEPARTMENT Provider Note   CSN: [id number] Arrival date & time: 11/29/22  9629  An emergency department physician performed an initial assessment on this suspected stroke patient at 0909.  History  Chief Complaint  Patient presents with   Code Stroke    Christopher [name] is a 76 y.o. male with T2DM on insulin, CKD stage 3, GERD, HLD, HTN, venous insufficiency, glaucoma who presents with code stroke, aphasia.  Patient woke up this morning and called his wife into his bedroom to tell her he had low blood sugar, which was in the 50s. Wife gave him orange juice and retest was 77. 911 was called and EMS noted patient to be not making sense, with nonsensical words/responses to questions. Stroke code was called. Does not take blood thinners. No h/o CVA.   LKN: last night 2030 prior to going to bed POC glucose: 232 BP: 166/92 AC?: No  HPI     Home Medications Prior to Admission medications   Medication Sig Start Date End Date Taking? Authorizing Provider  Continuous Blood Gluc Receiver (DEXCOM G7 RECEIVER) DEVI Use to check blood sugar 11/19/22   Elayne Snare, MD  Continuous Blood Gluc Sensor (DEXCOM G6 SENSOR) MISC Change every 10 days 11/19/22   Elayne Snare, MD  Continuous Blood Gluc Transmit (DEXCOM G6 TRANSMITTER) MISC Use every 3 months 11/19/22   Elayne Snare, MD  Insulin Pen Needle 32G X 4 MM MISC Use as instructed to administer insulin 4-5X daily. E10.65 10/29/22   Elayne Snare, MD  Acetaminophen (TYLENOL PO) Take 1-2 tablets by mouth daily as needed (pain, headache).    [provider]  aspirin (ASPIRIN CHILDRENS) 81 MG chewable tablet Chew 1 tablet (81 mg total) by mouth daily. 10/07/22 01/05/23  Vashti Hey, MD  atorvastatin (LIPITOR) 80 MG tablet Take 1 tablet (80 mg total) by mouth daily. 03/25/22   Little Ishikawa, MD  brimonidine (ALPHAGAN) 0.15 % ophthalmic solution Place 1 drop into both eyes 2 (two) times daily.     [provider]  Continuous Blood Gluc Receiver (FREESTYLE LIBRE 2 READER) DEVI 1 each by Does not apply route daily. 04/19/22   Renato Shin, MD  Continuous Blood Gluc Sensor (FREESTYLE LIBRE 2 SENSOR) MISC 1 Device by Does not apply route every 14 (fourteen) days. 04/03/22   Isaac Bliss, Rayford Halsted, MD  dorzolamide-timolol (COSOPT) 22.3-6.8 MG/ML ophthalmic solution Place 1 drop into both eyes 2 (two) times daily.  03/03/11   [provider]  glucagon 1 MG injection Inject 1 mg into the muscle once as needed. Patient taking differently: Inject 1 mg into the muscle as needed (low blood sugar). 03/12/16   Renato Shin, MD  glucose blood [facility] VERIO) test strip 1 each by Other route 2 (two) times daily. And lancets 2/day 04/19/22   Renato Shin, MD  insulin aspart (NOVOLOG FLEXPEN) 100 UNIT/ML FlexPen Inject 8-10 Units into the skin 3 (three) times daily with meals. Patient taking differently: Inject 8 Units into the skin 3 (three) times daily with meals. 08/14/22   Elayne Snare, MD  insulin glargine (LANTUS SOLOSTAR) 100 UNIT/ML Solostar Pen Inject 24-34 Units into the skin See admin instructions. 34 units in the morning, 24 units in the evening    [provider]  Insulin Syringe-Needle U-100 (BD VEO INSULIN SYRINGE U/F) 31G X 15/64" 1 ML MISC USE  SYRINGE TWICE DAILY 07/29/22   Elayne Snare, MD  lisinopril (ZESTRIL) 5 MG tablet Take 1  tablet (5 mg total) by mouth daily. 05/15/22   Isaac Bliss, Rayford Halsted, MD  Multiple Vitamin (MULTIVITAMIN) tablet Take 1 tablet by mouth daily. Patient not taking: Reported on 10/08/2022    [provider]  Multiple Vitamins-Minerals (MULTIVITAMIN ADULTS 50+) TABS Take 1 tablet by mouth daily.    [provider]  potassium chloride (KLOR-CON) 10 MEQ tablet Take 1 tablet (10 mEq total) by mouth daily. 10 mEq daily Patient taking differently: Take 10 mEq by mouth daily. 08/20/22   Elayne Snare, MD      Allergies     Patient has no known allergies.    Review of Systems   Review of Systems Review of systems Negative for recent ilnesses, f/c.  A 10 point review of systems was performed and is negative unless otherwise reported in HPI.  Physical Exam Updated Vital Signs BP (!) 145/90   Pulse 68   Temp 98.2 F (36.8 C) (Oral)   Resp 17   Wt 93.8 kg   SpO2 99%   BMI 25.85 kg/m  Physical Exam General: Normal appearing male, lying in bed.  HEENT: PERRLA, Sclera anicteric, MMM, trachea midline.  Cardiology: RRR, no murmurs/rubs/gallops. BL radial and DP pulses equal bilaterally.  Resp: Normal respiratory rate and effort. CTAB, no wheezes, rhonchi, crackles.  Abd: Soft, non-tender, non-distended. No rebound tenderness or guarding.  GU: Deferred. MSK: No peripheral edema or signs of trauma. Extremities without deformity or TTP. No cyanosis or clubbing. Skin: warm, dry. No rashes or lesions. Back: No CVA tenderness Neuro: A&Ox4, CNs II-XII grossly intact. MAEs. Sensation grossly intact.  Psych: Normal mood and affect.   1a  Level of consciousness: 0=alert; keenly responsive  1b. LOC questions:  2 = performs neither task correctly  1c. LOC commands: 0=Performs both tasks correctly  2.  Best Gaze: 0=normal  3.  Visual: 0=No visual loss  4. Facial Palsy: 0=Normal symmetric movement  5a.  Motor left arm: 0=No drift, limb holds 90 (or 45) degrees for full 10 seconds  5b.  Motor right arm: 0=No drift, limb holds 90 (or 45) degrees for full 10 seconds  6a. motor left leg: 0=No drift, limb holds 90 (or 45) degrees for full 10 seconds  6b  Motor right leg:  0=No drift, limb holds 90 (or 45) degrees for full 10 seconds  7. Limb Ataxia: 0=Absent  8.  Sensory: 0=Normal; no sensory loss  9. Best Language:  1=Mild to moderate aphasia; some obvious loss of fluency or facility of comprehension without significant limitation on ideas expressed or form of expression.  10. Dysarthria: 1=Mild to moderate, patient  slurs at least some words and at worst, can be understood with some difficulty  11. Extinction and Inattention: 0=No abnormality   Total:   4        ED Results / Procedures / Treatments   Labs (all labs ordered are listed, but only abnormal results are displayed) Labs Reviewed  CBC - Abnormal; Notable for the following components:      Result Value   Hemoglobin 12.6 (*)    HCT 36.4 (*)    MCV 74.6 (*)    MCH 25.8 (*)    All other components within normal limits  COMPREHENSIVE METABOLIC PANEL - Abnormal; Notable for the following components:   Glucose, Bld 267 (*)    Creatinine, Ser 1.36 (*)    Calcium 8.5 (*)    Total Protein 5.9 (*)    Albumin 3.4 (*)  GFR, Estimated 54 (*)    All other components within normal limits  URINALYSIS, ROUTINE W REFLEX MICROSCOPIC - Abnormal; Notable for the following components:   Specific Gravity, Urine 1.037 (*)    Glucose, UA >=500 (*)    All other components within normal limits  CBG MONITORING, ED - Abnormal; Notable for the following components:   Glucose-Capillary 170 (*)    All other components within normal limits  POCT I-STAT, CHEM 8 - Abnormal; Notable for the following components:   Creatinine, Ser 1.30 (*)    Glucose, Bld 167 (*)    Calcium, Ion 0.92 (*)    TCO2 19 (*)    All other components within normal limits  ETHANOL  PROTIME-INR  APTT  DIFFERENTIAL  RAPID URINE DRUG SCREEN, HOSP PERFORMED  I-STAT CHEM 8, ED  CBG MONITORING, ED    EKG EKG Interpretation  Date/Time:  Friday November 29 2022 09:45:28 EST Ventricular Rate:  84 PR Interval:  188 QRS Duration: 94 QT Interval:  378 QTC Calculation: 447 R Axis:   -14 Text Interpretation: Sinus rhythm Confirmed by Cindee Lame [phone]) on 11/29/2022 1:35:43 PM  Radiology CT HEAD CODE STROKE WO CONTRAST  Result Date: 11/29/2022 CLINICAL DATA:  Code stroke.  Aphasia abnormal gait EXAM: CT ANGIOGRAPHY HEAD AND NECK CT PERFUSION BRAIN TECHNIQUE: Multidetector CT  imaging of the head and neck was performed using the standard protocol during bolus administration of intravenous contrast. Multiplanar CT image reconstructions and MIPs were obtained to evaluate the vascular anatomy. Carotid stenosis measurements (when applicable) are obtained utilizing NASCET criteria, using the distal internal carotid diameter as the denominator. Multiphase CT imaging of the brain was performed following IV bolus contrast injection. Subsequent parametric perfusion maps were calculated using RAPID software. RADIATION DOSE REDUCTION: This exam was performed according to the departmental dose-optimization program which includes automated exposure control, adjustment of the mA and/or kV according to patient size and/or use of iterative reconstruction technique. CONTRAST:  100 cc Omnipaque 350 COMPARISON:  CT head 03/23/2019 FINDINGS: CT HEAD FINDINGS Brain: There is no acute intracranial hemorrhage, extra-axial fluid collection, or acute infarct. Parenchymal volume is normal for age. The ventricles are normal in size. Gray-white differentiation is preserved. There is a probable small remote lacunar infarct in the left lentiform nucleus. There is no mass lesion. There is no mass effect or midline shift. Vascular: No hyperdense vessel or unexpected calcification. Skull: Normal. Negative for fracture or focal lesion. Sinuses/Orbits: There is mild mucosal thickening in the paranasal sinuses. The globes and orbits are unremarkable. Other: None. ASPECTS [facility] Stroke Program Early CT Score) - Ganglionic level infarction (caudate, lentiform nuclei, internal capsule, insula, M1-M3 cortex): 7 - Supraganglionic infarction (M4-M6 cortex): 3 Total score (0-10 with 10 being normal): 10 CTA NECK FINDINGS Aortic arch: The aortic arch is normal. The origins of the major branch vessels are patent. The subclavian arteries are patent to the level imaged. Right carotid system: The right common, internal, and external  carotid arteries are patent with minimal plaque at the bifurcation but no hemodynamically significant stenosis or occlusion. The internal carotid artery is tortuous in the upper neck. There is no dissection or aneurysm. Left carotid system: The left common, internal, and external carotid arteries are patent, with minimal plaque at the bifurcation but no hemodynamically significant stenosis or occlusion. The internal carotid artery is mildly tortuous in the upper neck. There is no dissection or aneurysm. Vertebral arteries: The vertebral arteries are patent, without hemodynamically significant stenosis or occlusion.  There is no dissection or aneurysm. Skeleton: There is no acute osseous abnormality or suspicious osseous lesion. There is no visible canal hematoma. Other neck: The soft tissues of the neck are unremarkable. There is extensive dental disease. Upper chest: The imaged lung apices are clear. Review of the MIP images confirms the above findings CTA HEAD FINDINGS Anterior circulation: The intracranial ICAs are patent with mild calcified plaque but no hemodynamically significant stenosis or occlusion. The bilateral MCAs are patent, without proximal stenosis or occlusion. The bilateral ACAs are patent, without proximal stenosis or occlusion. The left A1 segment is hypoplastic/absent, a normal variant. The anterior communicating artery is normal. There is no aneurysm or AVM. Posterior circulation: The bilateral V4 segments are patent. The basilar artery is patent. The major cerebellar arteries appear patent. The bilateral PCAs are patent, without proximal stenosis or occlusion. Bilateral posterior communicating arteries are identified. There is no aneurysm or AVM. Venous sinuses: Patent. Anatomic variants: None. Review of the MIP images confirms the above findings CT Brain Perfusion Findings: ASPECTS: 10 CBF (<30%) Volume: 371m Perfusion (Tmax>6.0s) volume: 0471mMismatch Volume: 71m37mnfarction Location:N/a  IMPRESSION: 1. No acute intracranial pathology.  ASPECTS is 10 2. Patent vasculature of the head and neck with no hemodynamically significant stenosis or occlusion. 3. No infarct core or penumbra identified on CT perfusion. Findings of the initial noncontrast head CT communicated to Dr LinCheral Markera AMIShea Evans 9:23 am. CTA findings were discussed via telephone at 9:41 a.m. Electronically Signed   By: PetValetta MoleD.   On: 11/29/2022 09:47   CT ANGIO HEAD NECK W WO CM W PERF (CODE STROKE)  Result Date: 11/29/2022 CLINICAL DATA:  Code stroke.  Aphasia abnormal gait EXAM: CT ANGIOGRAPHY HEAD AND NECK CT PERFUSION BRAIN TECHNIQUE: Multidetector CT imaging of the head and neck was performed using the standard protocol during bolus administration of intravenous contrast. Multiplanar CT image reconstructions and MIPs were obtained to evaluate the vascular anatomy. Carotid stenosis measurements (when applicable) are obtained utilizing NASCET criteria, using the distal internal carotid diameter as the denominator. Multiphase CT imaging of the brain was performed following IV bolus contrast injection. Subsequent parametric perfusion maps were calculated using RAPID software. RADIATION DOSE REDUCTION: This exam was performed according to the departmental dose-optimization program which includes automated exposure control, adjustment of the mA and/or kV according to patient size and/or use of iterative reconstruction technique. CONTRAST:  100 cc Omnipaque 350 COMPARISON:  CT head 03/23/2019 FINDINGS: CT HEAD FINDINGS Brain: There is no acute intracranial hemorrhage, extra-axial fluid collection, or acute infarct. Parenchymal volume is normal for age. The ventricles are normal in size. Gray-white differentiation is preserved. There is a probable small remote lacunar infarct in the left lentiform nucleus. There is no mass lesion. There is no mass effect or midline shift. Vascular: No hyperdense vessel or unexpected  calcification. Skull: Normal. Negative for fracture or focal lesion. Sinuses/Orbits: There is mild mucosal thickening in the paranasal sinuses. The globes and orbits are unremarkable. Other: None. ASPECTS ([facility] Program Early CT Score) - Ganglionic level infarction (caudate, lentiform nuclei, internal capsule, insula, M1-M3 cortex): 7 - Supraganglionic infarction (M4-M6 cortex): 3 Total score (0-10 with 10 being normal): 10 CTA NECK FINDINGS Aortic arch: The aortic arch is normal. The origins of the major branch vessels are patent. The subclavian arteries are patent to the level imaged. Right carotid system: The right common, internal, and external carotid arteries are patent with minimal plaque at the bifurcation but no hemodynamically significant  stenosis or occlusion. The internal carotid artery is tortuous in the upper neck. There is no dissection or aneurysm. Left carotid system: The left common, internal, and external carotid arteries are patent, with minimal plaque at the bifurcation but no hemodynamically significant stenosis or occlusion. The internal carotid artery is mildly tortuous in the upper neck. There is no dissection or aneurysm. Vertebral arteries: The vertebral arteries are patent, without hemodynamically significant stenosis or occlusion. There is no dissection or aneurysm. Skeleton: There is no acute osseous abnormality or suspicious osseous lesion. There is no visible canal hematoma. Other neck: The soft tissues of the neck are unremarkable. There is extensive dental disease. Upper chest: The imaged lung apices are clear. Review of the MIP images confirms the above findings CTA HEAD FINDINGS Anterior circulation: The intracranial ICAs are patent with mild calcified plaque but no hemodynamically significant stenosis or occlusion. The bilateral MCAs are patent, without proximal stenosis or occlusion. The bilateral ACAs are patent, without proximal stenosis or occlusion. The left A1  segment is hypoplastic/absent, a normal variant. The anterior communicating artery is normal. There is no aneurysm or AVM. Posterior circulation: The bilateral V4 segments are patent. The basilar artery is patent. The major cerebellar arteries appear patent. The bilateral PCAs are patent, without proximal stenosis or occlusion. Bilateral posterior communicating arteries are identified. There is no aneurysm or AVM. Venous sinuses: Patent. Anatomic variants: None. Review of the MIP images confirms the above findings CT Brain Perfusion Findings: ASPECTS: 10 CBF (<30%) Volume: 87m Perfusion (Tmax>6.0s) volume: 066mMismatch Volume: 26m49mnfarction Location:N/a IMPRESSION: 1. No acute intracranial pathology.  ASPECTS is 10 2. Patent vasculature of the head and neck with no hemodynamically significant stenosis or occlusion. 3. No infarct core or penumbra identified on CT perfusion. Findings of the initial noncontrast head CT communicated to Dr LinCheral Markera AMIShea Evans 9:23 am. CTA findings were discussed via telephone at 9:41 a.m. Electronically Signed   By: PetValetta MoleD.   On: 11/29/2022 09:47    Procedures Procedures    Medications Ordered in ED Medications  clopidogrel (PLAVIX) tablet 75 mg (has no administration in time range)  aspirin chewable tablet 81 mg (has no administration in time range)  atorvastatin (LIPITOR) tablet 80 mg (has no administration in time range)  brimonidine (ALPHAGAN) 0.15 % ophthalmic solution 1 drop (has no administration in time range)  dorzolamide-timolol (COSOPT) 2-0.5 % ophthalmic solution 1 drop (has no administration in time range)   stroke: early stages of recovery book (has no administration in time range)  acetaminophen (TYLENOL) tablet 650 mg (has no administration in time range)    Or  acetaminophen (TYLENOL) 160 MG/5ML solution 650 mg (has no administration in time range)    Or  acetaminophen (TYLENOL) suppository 650 mg (has no administration in time range)   senna-docusate (Senokot-S) tablet 1 tablet (has no administration in time range)  enoxaparin (LOVENOX) injection 40 mg (has no administration in time range)  insulin aspart (novoLOG) injection 0-15 Units (has no administration in time range)  LORazepam (ATIVAN) tablet 0.5 mg (has no administration in time range)  iohexol (OMNIPAQUE) 350 MG/ML injection 100 mL (100 mLs Intravenous Contrast Given 11/29/22 0936)    ED Course/ Medical Decision Making/ A&P                          Medical Decision Making Amount and/or Complexity of Data Reviewed Labs: ordered. Decision-making details documented in ED Course. Radiology: ordered. Decision-making  details documented in ED Course.  Risk Prescription drug management. Decision regarding hospitalization.   MDM:    Given the acute onset of neurological symptoms, stroke is the most concerning etiology of these acute symptoms. The neuro exam is significant for some dysarthria, aphasia, and disorientation. NIHSS 4. Also consider low blood sugar given report of hypoglycemia this AM. No infectious symptoms reported. Consider electrolyte abnormalities, toxic ingestion/drug use, or post-ictal state from a seizure as well. No h/o seizures.  Neurology team evaluating patient at bedside. Plan to obtain emergent CT brain.  LKN: 2030 yesterday Glucose: 232 mg/dL AC: No BP: 166/92   Plan: - stat head CT, consider additional imaging including CTA and perfusion scan pending initial CT  - neurology has been consulted, evaluating at bedside - considered TNK if neg head CT, but out of window - Manage hypertension as needed - labs & other orders as below  Clinical Course as of 11/29/22 1338  Fri Nov 29, 2022  1011 CT ANGIO HEAD NECK W WO CM W PERF (CODE STROKE) IMPRESSION: 1. No acute intracranial pathology.  ASPECTS is 10 2. Patent vasculature of the head and neck with no hemodynamically significant stenosis or occlusion. 3. No infarct core or  penumbra identified on CT perfusion.  Findings of the initial noncontrast head CT communicated to Dr Cheral Marker via Shea Evans at 9:23 am.   [HN]  1011 Glucose-Capillary(!): 170 [HN]  1011 Glucose(!): 167 [HN]  1011 Creatinine(!): 1.30 [HN]  1054 Hemoglobin(!): 12.6 [HN]  1054 WBC: 5.8 [HN]  1123 Alcohol, Ethyl (B): <10 [HN]  1123 Comprehensive metabolic panel(!) No significant electrolyte abnormalities [HN]  1124 Creatinine(!): 1.36 At approximate baseline [HN]  1336 Urine rapid drug screen (hosp performed) Neg [HN]  1337 Urinalysis, Routine w reflex microscopic(!) No UTI [HN]  1337 No significant abnormalities on EKG, labs, or imaging thus far. Neurology requests admission to hospitalist for stroke w/u. Admitted to hospitalist. Brain MRI pending. [HN]    Clinical Course User Index [HN] Audley Hose, MD     Labs: I Ordered, and personally interpreted labs.  The pertinent results include:  those listed above  Imaging Studies ordered: I ordered imaging studies including CTH, CTA/perfusion I independently visualized and interpreted imaging. I agree with the radiologist interpretation  Additional history obtained from chart review, patient, wife, and EMS.   Cardiac Monitoring: The patient was maintained on a cardiac monitor.  I personally viewed and interpreted the cardiac monitored which showed an underlying rhythm of: NSR  Reevaluation: After the interventions noted above, I reevaluated the patient and found that they have :stayed the same  Social Determinants of Health: Patient lives independently   Disposition:  Admitted to hospitalist   Co morbidities that complicate the patient evaluation  Past Medical History:  Diagnosis Date   Diabetes mellitus type II    GERD (gastroesophageal reflux disease)    Glaucoma    Hyperlipidemia    Hypertension      Medicines Meds ordered this encounter  Medications   iohexol (OMNIPAQUE) 350 MG/ML injection 100 mL    clopidogrel (PLAVIX) tablet 75 mg   DISCONTD: acetaminophen (TYLENOL) tablet 650 mg   aspirin chewable tablet 81 mg   atorvastatin (LIPITOR) tablet 80 mg   DISCONTD: lisinopril (ZESTRIL) tablet 5 mg   brimonidine (ALPHAGAN) 0.15 % ophthalmic solution 1 drop   dorzolamide-timolol (COSOPT) 2-0.5 % ophthalmic solution 1 drop    stroke: early stages of recovery book   OR Linked Order Group    acetaminophen (  TYLENOL) tablet 650 mg    acetaminophen (TYLENOL) 160 MG/5ML solution 650 mg    acetaminophen (TYLENOL) suppository 650 mg   senna-docusate (Senokot-S) tablet 1 tablet   enoxaparin (LOVENOX) injection 40 mg   insulin aspart (novoLOG) injection 0-15 Units    Order Specific Question:   Correction coverage:    Answer:   Moderate (average weight, post-op)    Order Specific Question:   CBG < 70:    Answer:   Implement Hypoglycemia Standing Orders and refer to Hypoglycemia Standing Orders sidebar report    Order Specific Question:   CBG 70 - 120:    Answer:   0 units    Order Specific Question:   CBG 121 - 150:    Answer:   2 units    Order Specific Question:   CBG 151 - 200:    Answer:   3 units    Order Specific Question:   CBG 201 - 250:    Answer:   5 units    Order Specific Question:   CBG 251 - 300:    Answer:   8 units    Order Specific Question:   CBG 301 - 350:    Answer:   11 units    Order Specific Question:   CBG 351 - 400:    Answer:   15 units    Order Specific Question:   CBG > 400    Answer:   call MD and obtain STAT lab verification   LORazepam (ATIVAN) tablet 0.5 mg    I have reviewed the patients home medicines and have made adjustments as needed  Problem List / ED Course: Problem List Items Addressed This Visit   None Visit Diagnoses     Aphasia    -  Primary   Slurred speech       Disorientation                     This note was created using dictation software, which may contain spelling or grammatical errors.    Audley Hose,  MD 11/29/22 1340

## 2022-12-03 ENCOUNTER — Emergency Department (HOSPITAL_BASED_OUTPATIENT_CLINIC_OR_DEPARTMENT_OTHER): Payer: Medicare HMO

## 2022-12-03 ENCOUNTER — Other Ambulatory Visit: Payer: Self-pay

## 2022-12-03 ENCOUNTER — Inpatient Hospital Stay (HOSPITAL_COMMUNITY): Payer: Medicare HMO

## 2022-12-03 ENCOUNTER — Other Ambulatory Visit (HOSPITAL_COMMUNITY): Payer: Medicare HMO

## 2022-12-03 ENCOUNTER — Encounter (HOSPITAL_COMMUNITY): Payer: Self-pay

## 2022-12-03 ENCOUNTER — Encounter (HOSPITAL_BASED_OUTPATIENT_CLINIC_OR_DEPARTMENT_OTHER): Payer: Self-pay

## 2022-12-03 ENCOUNTER — Inpatient Hospital Stay (HOSPITAL_BASED_OUTPATIENT_CLINIC_OR_DEPARTMENT_OTHER)
Admission: EM | Admit: 2022-12-03 | Discharge: 2022-12-10 | DRG: 637 | Disposition: A | Discharge status: Home Health | Payer: Medicare HMO | Attending: Internal Medicine | Admitting: Internal Medicine

## 2022-12-03 ENCOUNTER — Telehealth: Payer: Self-pay | Admitting: Dietician

## 2022-12-03 ENCOUNTER — Other Ambulatory Visit (HOSPITAL_COMMUNITY): Payer: Self-pay

## 2022-12-03 DIAGNOSIS — R778 Other specified abnormalities of plasma proteins: Secondary | ICD-10-CM | POA: Diagnosis not present

## 2022-12-03 DIAGNOSIS — R7989 Other specified abnormal findings of blood chemistry: Secondary | ICD-10-CM | POA: Diagnosis not present

## 2022-12-03 DIAGNOSIS — E1111 Type 2 diabetes mellitus with ketoacidosis with coma: Secondary | ICD-10-CM

## 2022-12-03 DIAGNOSIS — E108 Type 1 diabetes mellitus with unspecified complications: Secondary | ICD-10-CM

## 2022-12-03 DIAGNOSIS — Z781 Physical restraint status: Secondary | ICD-10-CM | POA: Diagnosis not present

## 2022-12-03 DIAGNOSIS — Z79899 Other long term (current) drug therapy: Secondary | ICD-10-CM | POA: Diagnosis not present

## 2022-12-03 DIAGNOSIS — E785 Hyperlipidemia, unspecified: Secondary | ICD-10-CM | POA: Diagnosis present

## 2022-12-03 DIAGNOSIS — E1010 Type 1 diabetes mellitus with ketoacidosis without coma: Secondary | ICD-10-CM | POA: Diagnosis not present

## 2022-12-03 DIAGNOSIS — I959 Hypotension, unspecified: Secondary | ICD-10-CM | POA: Diagnosis present

## 2022-12-03 DIAGNOSIS — E11649 Type 2 diabetes mellitus with hypoglycemia without coma: Secondary | ICD-10-CM | POA: Diagnosis not present

## 2022-12-03 DIAGNOSIS — E88819 Insulin resistance, unspecified: Secondary | ICD-10-CM | POA: Diagnosis not present

## 2022-12-03 DIAGNOSIS — M1712 Unilateral primary osteoarthritis, left knee: Secondary | ICD-10-CM | POA: Diagnosis present

## 2022-12-03 DIAGNOSIS — I214 Non-ST elevation (NSTEMI) myocardial infarction: Secondary | ICD-10-CM | POA: Diagnosis present

## 2022-12-03 DIAGNOSIS — K529 Noninfective gastroenteritis and colitis, unspecified: Secondary | ICD-10-CM | POA: Diagnosis present

## 2022-12-03 DIAGNOSIS — D72829 Elevated white blood cell count, unspecified: Secondary | ICD-10-CM | POA: Diagnosis not present

## 2022-12-03 DIAGNOSIS — N1831 Chronic kidney disease, stage 3a: Secondary | ICD-10-CM | POA: Diagnosis present

## 2022-12-03 DIAGNOSIS — N281 Cyst of kidney, acquired: Secondary | ICD-10-CM | POA: Diagnosis not present

## 2022-12-03 DIAGNOSIS — H409 Unspecified glaucoma: Secondary | ICD-10-CM | POA: Diagnosis present

## 2022-12-03 DIAGNOSIS — E1310 Other specified diabetes mellitus with ketoacidosis without coma: Secondary | ICD-10-CM | POA: Diagnosis not present

## 2022-12-03 DIAGNOSIS — R6510 Systemic inflammatory response syndrome (SIRS) of non-infectious origin without acute organ dysfunction: Secondary | ICD-10-CM | POA: Diagnosis present

## 2022-12-03 DIAGNOSIS — N179 Acute kidney failure, unspecified: Secondary | ICD-10-CM | POA: Diagnosis present

## 2022-12-03 DIAGNOSIS — E860 Dehydration: Secondary | ICD-10-CM | POA: Diagnosis present

## 2022-12-03 DIAGNOSIS — K219 Gastro-esophageal reflux disease without esophagitis: Secondary | ICD-10-CM | POA: Diagnosis present

## 2022-12-03 DIAGNOSIS — I10 Essential (primary) hypertension: Secondary | ICD-10-CM | POA: Diagnosis not present

## 2022-12-03 DIAGNOSIS — E1110 Type 2 diabetes mellitus with ketoacidosis without coma: Secondary | ICD-10-CM | POA: Diagnosis not present

## 2022-12-03 DIAGNOSIS — Z794 Long term (current) use of insulin: Secondary | ICD-10-CM | POA: Diagnosis not present

## 2022-12-03 DIAGNOSIS — M029 Reactive arthropathy, unspecified: Secondary | ICD-10-CM | POA: Diagnosis present

## 2022-12-03 DIAGNOSIS — Z1152 Encounter for screening for COVID-19: Secondary | ICD-10-CM | POA: Diagnosis not present

## 2022-12-03 DIAGNOSIS — Z7982 Long term (current) use of aspirin: Secondary | ICD-10-CM | POA: Diagnosis not present

## 2022-12-03 DIAGNOSIS — M25562 Pain in left knee: Secondary | ICD-10-CM | POA: Diagnosis not present

## 2022-12-03 DIAGNOSIS — R079 Chest pain, unspecified: Secondary | ICD-10-CM | POA: Diagnosis not present

## 2022-12-03 DIAGNOSIS — Z833 Family history of diabetes mellitus: Secondary | ICD-10-CM

## 2022-12-03 DIAGNOSIS — E782 Mixed hyperlipidemia: Secondary | ICD-10-CM

## 2022-12-03 DIAGNOSIS — M7989 Other specified soft tissue disorders: Secondary | ICD-10-CM | POA: Diagnosis not present

## 2022-12-03 DIAGNOSIS — E1159 Type 2 diabetes mellitus with other circulatory complications: Secondary | ICD-10-CM | POA: Diagnosis not present

## 2022-12-03 DIAGNOSIS — G9341 Metabolic encephalopathy: Secondary | ICD-10-CM | POA: Diagnosis not present

## 2022-12-03 DIAGNOSIS — I129 Hypertensive chronic kidney disease with stage 1 through stage 4 chronic kidney disease, or unspecified chronic kidney disease: Secondary | ICD-10-CM | POA: Diagnosis present

## 2022-12-03 DIAGNOSIS — M25462 Effusion, left knee: Secondary | ICD-10-CM | POA: Diagnosis not present

## 2022-12-03 LAB — BASIC METABOLIC PANEL
Anion gap: 37 — ABNORMAL HIGH (ref 5–15)
Anion gap: 16 — ABNORMAL HIGH (ref 5–15)
Anion gap: 9 (ref 5–15)
BUN: 60 mg/dL — ABNORMAL HIGH (ref 8–23)
BUN: 56 mg/dL — ABNORMAL HIGH (ref 8–23)
BUN: 53 mg/dL — ABNORMAL HIGH (ref 8–23)
CO2: 7 mmol/L — ABNORMAL LOW (ref 22–32)
CO2: 16 mmol/L — ABNORMAL LOW (ref 22–32)
CO2: 24 mmol/L (ref 22–32)
Calcium: 8.9 mg/dL (ref 8.9–10.3)
Calcium: 8.2 mg/dL — ABNORMAL LOW (ref 8.9–10.3)
Calcium: 8.5 mg/dL — ABNORMAL LOW (ref 8.9–10.3)
Chloride: 85 mmol/L — ABNORMAL LOW (ref 98–111)
Chloride: 108 mmol/L (ref 98–111)
Chloride: 111 mmol/L (ref 98–111)
Creatinine, Ser: 3.27 mg/dL — ABNORMAL HIGH (ref 0.61–1.24)
Creatinine, Ser: 2.97 mg/dL — ABNORMAL HIGH (ref 0.61–1.24)
Creatinine, Ser: 2.71 mg/dL — ABNORMAL HIGH (ref 0.61–1.24)
GFR, Estimated: 19 mL/min — ABNORMAL LOW (ref >60)
GFR, Estimated: 21 mL/min — ABNORMAL LOW (ref >60)
GFR, Estimated: 24 mL/min — ABNORMAL LOW (ref >60)
Glucose, Bld: >1200 mg/dL (ref 70–99)
Glucose, Bld: 534 mg/dL (ref 70–99)
Glucose, Bld: 334 mg/dL — ABNORMAL HIGH (ref 70–99)
Potassium: 6.6 mmol/L (ref 3.5–5.1)
Potassium: 4 mmol/L (ref 3.5–5.1)
Potassium: 3.6 mmol/L (ref 3.5–5.1)
Sodium: 129 mmol/L — ABNORMAL LOW (ref 135–145)
Sodium: 140 mmol/L (ref 135–145)
Sodium: 144 mmol/L (ref 135–145)

## 2022-12-03 LAB — CBC WITH DIFFERENTIAL/PLATELET
Abs Immature Granulocytes: 0.19 10*3/uL — ABNORMAL HIGH (ref 0.00–0.07)
Basophils Absolute: 0 10*3/uL (ref 0.0–0.1)
Basophils Relative: 0 %
Eosinophils Absolute: 0 10*3/uL (ref 0.0–0.5)
Eosinophils Relative: 0 %
HCT: 38.7 % — ABNORMAL LOW (ref 39.0–52.0)
Hemoglobin: 12.7 g/dL — ABNORMAL LOW (ref 13.0–17.0)
Immature Granulocytes: 1 %
Lymphocytes Relative: 9 %
Lymphs Abs: 1.8 10*3/uL (ref 0.7–4.0)
MCH: 25.8 pg — ABNORMAL LOW (ref 26.0–34.0)
MCHC: 32.8 g/dL (ref 30.0–36.0)
MCV: 78.7 fL — ABNORMAL LOW (ref 80.0–100.0)
Monocytes Absolute: 1.2 10*3/uL — ABNORMAL HIGH (ref 0.1–1.0)
Monocytes Relative: 6 %
Neutro Abs: 16.2 10*3/uL — ABNORMAL HIGH (ref 1.7–7.7)
Neutrophils Relative %: 84 %
Platelets: 310 10*3/uL (ref 150–400)
RBC: 4.92 MIL/uL (ref 4.22–5.81)
RDW: 15.3 % (ref 11.5–15.5)
WBC: 19.4 10*3/uL — ABNORMAL HIGH (ref 4.0–10.5)
nRBC: 0 % (ref 0.0–0.2)

## 2022-12-03 LAB — I-STAT VENOUS BLOOD GAS, ED
Acid-base deficit: 19 mmol/L — ABNORMAL HIGH (ref 0.0–2.0)
Bicarbonate: 7.6 mmol/L — ABNORMAL LOW (ref 20.0–28.0)
Calcium, Ion: 1.05 mmol/L — ABNORMAL LOW (ref 1.15–1.40)
HCT: 41 % (ref 39.0–52.0)
Hemoglobin: 13.9 g/dL (ref 13.0–17.0)
O2 Saturation: 62 %
Potassium: 6.4 mmol/L (ref 3.5–5.1)
Sodium: 126 mmol/L — ABNORMAL LOW (ref 135–145)
TCO2: 8 mmol/L — ABNORMAL LOW (ref 22–32)
pCO2, Ven: 21.5 mmHg — ABNORMAL LOW (ref 44–60)
pH, Ven: 7.156 — CL (ref 7.25–7.43)
pO2, Ven: 40 mmHg (ref 32–45)

## 2022-12-03 LAB — URINALYSIS, ROUTINE W REFLEX MICROSCOPIC
Bilirubin Urine: NEGATIVE
Glucose, UA: >1000 mg/dL — AB
Hgb urine dipstick: NEGATIVE
Ketones, ur: 15 mg/dL — AB
Leukocytes,Ua: NEGATIVE
Nitrite: NEGATIVE
Protein, ur: NEGATIVE mg/dL
Specific Gravity, Urine: 1.022 (ref 1.005–1.030)
pH: 5 (ref 5.0–8.0)

## 2022-12-03 LAB — GLUCOSE, CAPILLARY
Glucose-Capillary: 530 mg/dL (ref 70–99)
Glucose-Capillary: 509 mg/dL (ref 70–99)
Glucose-Capillary: 419 mg/dL — ABNORMAL HIGH (ref 70–99)
Glucose-Capillary: 375 mg/dL — ABNORMAL HIGH (ref 70–99)
Glucose-Capillary: 278 mg/dL — ABNORMAL HIGH (ref 70–99)
Glucose-Capillary: 235 mg/dL — ABNORMAL HIGH (ref 70–99)
Glucose-Capillary: 175 mg/dL — ABNORMAL HIGH (ref 70–99)
Glucose-Capillary: 225 mg/dL — ABNORMAL HIGH (ref 70–99)

## 2022-12-03 LAB — RESP PANEL BY RT-PCR (RSV, FLU A&B, COVID)  RVPGX2
Influenza A by PCR: NEGATIVE
Influenza B by PCR: NEGATIVE
Resp Syncytial Virus by PCR: NEGATIVE
SARS Coronavirus 2 by RT PCR: NEGATIVE

## 2022-12-03 LAB — BETA-HYDROXYBUTYRIC ACID
Beta-Hydroxybutyric Acid: >8 mmol/L — ABNORMAL HIGH (ref 0.05–0.27)
Beta-Hydroxybutyric Acid: 4.21 mmol/L — ABNORMAL HIGH (ref 0.05–0.27)

## 2022-12-03 LAB — MAGNESIUM: Magnesium: 2.2 mg/dL (ref 1.7–2.4)

## 2022-12-03 LAB — TROPONIN I (HIGH SENSITIVITY)
Troponin I (High Sensitivity): 491 ng/L (ref <18)
Troponin I (High Sensitivity): 584 ng/L (ref <18)
Troponin I (High Sensitivity): 1021 ng/L (ref <18)
Troponin I (High Sensitivity): 2884 ng/L (ref <18)
Troponin I (High Sensitivity): 4389 ng/L (ref <18)
Troponin I (High Sensitivity): 4344 ng/L (ref <18)

## 2022-12-03 LAB — CBG MONITORING, ED
Glucose-Capillary: >600 mg/dL (ref 70–99)
Glucose-Capillary: >600 mg/dL (ref 70–99)
Glucose-Capillary: >600 mg/dL (ref 70–99)
Glucose-Capillary: >600 mg/dL (ref 70–99)
Glucose-Capillary: >600 mg/dL (ref 70–99)
Glucose-Capillary: >600 mg/dL (ref 70–99)
Glucose-Capillary: >600 mg/dL (ref 70–99)
Glucose-Capillary: >600 mg/dL (ref 70–99)
Glucose-Capillary: >600 mg/dL (ref 70–99)
Glucose-Capillary: 479 mg/dL — ABNORMAL HIGH (ref 70–99)

## 2022-12-03 LAB — CK: Total CK: 520 U/L — ABNORMAL HIGH (ref 49–397)

## 2022-12-03 LAB — PHOSPHORUS: Phosphorus: 5.4 mg/dL — ABNORMAL HIGH (ref 2.5–4.6)

## 2022-12-03 LAB — HEPARIN LEVEL (UNFRACTIONATED): Heparin Unfractionated: 0.64 IU/mL (ref 0.30–0.70)

## 2022-12-03 LAB — LIPASE, BLOOD: Lipase: 24 U/L (ref 11–51)

## 2022-12-03 MED ORDER — DORZOLAMIDE HCL-TIMOLOL MAL 2-0.5 % OP SOLN
1.0000 [drp] | Freq: Two times a day (BID) | OPHTHALMIC | Status: DC
  Administered 2022-12-03 – 2022-12-10 (×14): 1 [drp] via OPHTHALMIC
  Filled 2022-12-03: qty 10

## 2022-12-03 MED ORDER — BRIMONIDINE TARTRATE 0.15 % OP SOLN
1.0000 [drp] | Freq: Two times a day (BID) | OPHTHALMIC | Status: DC
  Administered 2022-12-03 – 2022-12-10 (×14): 1 [drp] via OPHTHALMIC
  Filled 2022-12-03: qty 5

## 2022-12-03 MED ORDER — DEXTROSE IN LACTATED RINGERS 5 % IV SOLN: INTRAVENOUS | Status: DC

## 2022-12-03 MED ORDER — SODIUM CHLORIDE 0.9 % IV BOLUS
1000.0000 mL | Freq: Once | INTRAVENOUS | Status: AC
  Administered 2022-12-03: 1000 mL via INTRAVENOUS

## 2022-12-03 MED ORDER — OLANZAPINE 10 MG IM SOLR
2.5000 mg | Freq: Four times a day (QID) | INTRAMUSCULAR | Status: DC | PRN
  Administered 2022-12-06: 2.5 mg via INTRAMUSCULAR
  Filled 2022-12-03 (×2): qty 10

## 2022-12-03 MED ORDER — INSULIN ASPART 100 UNIT/ML IJ SOLN
0.0000 [IU] | Freq: Every day | INTRAMUSCULAR | Status: DC
  Administered 2022-12-03: 2 [IU] via SUBCUTANEOUS
  Administered 2022-12-05 – 2022-12-06 (×2): 3 [IU] via SUBCUTANEOUS
  Administered 2022-12-07: 5 [IU] via SUBCUTANEOUS
  Administered 2022-12-09: 2 [IU] via SUBCUTANEOUS

## 2022-12-03 MED ORDER — ASPIRIN 81 MG PO CHEW
81.0000 mg | CHEWABLE_TABLET | Freq: Every day | ORAL | Status: DC
  Administered 2022-12-04 – 2022-12-10 (×7): 81 mg via ORAL
  Filled 2022-12-03 (×8): qty 1

## 2022-12-03 MED ORDER — ONDANSETRON HCL 4 MG/2ML IJ SOLN: 4.0000 mg | Freq: Four times a day (QID) | INTRAMUSCULAR | Status: DC | PRN

## 2022-12-03 MED ORDER — HEPARIN SODIUM (PORCINE) 5000 UNIT/ML IJ SOLN: 5000.0000 [IU] | Freq: Two times a day (BID) | INTRAMUSCULAR | Status: DC

## 2022-12-03 MED ORDER — DEXTROSE 50 % IV SOLN: 0.0000 mL | INTRAVENOUS | Status: DC | PRN

## 2022-12-03 MED ORDER — INSULIN REGULAR(HUMAN) IN NACL 100-0.9 UT/100ML-% IV SOLN
INTRAVENOUS | Status: DC
  Administered 2022-12-03: 18 [IU]/h via INTRAVENOUS
  Administered 2022-12-03: 13 [IU]/h via INTRAVENOUS
  Administered 2022-12-03: 22 [IU]/h via INTRAVENOUS
  Filled 2022-12-03 (×5): qty 100

## 2022-12-03 MED ORDER — SODIUM CHLORIDE 0.9 % IV BOLUS: 1000.0000 mL | Freq: Once | INTRAVENOUS | Status: DC

## 2022-12-03 MED ORDER — SODIUM CHLORIDE 0.9 % IV SOLN: INTRAVENOUS | Status: AC

## 2022-12-03 MED ORDER — ONDANSETRON HCL 4 MG/2ML IJ SOLN
4.0000 mg | Freq: Once | INTRAMUSCULAR | Status: AC
  Administered 2022-12-03: 4 mg via INTRAVENOUS
  Filled 2022-12-03: qty 2

## 2022-12-03 MED ORDER — LACTATED RINGERS IV SOLN: INTRAVENOUS | Status: DC

## 2022-12-03 MED ORDER — INSULIN ASPART 100 UNIT/ML IJ SOLN
0.0000 [IU] | Freq: Three times a day (TID) | INTRAMUSCULAR | Status: DC
  Administered 2022-12-04: 4 [IU] via SUBCUTANEOUS
  Administered 2022-12-04: 11 [IU] via SUBCUTANEOUS
  Administered 2022-12-04: 20 [IU] via SUBCUTANEOUS
  Administered 2022-12-05: 7 [IU] via SUBCUTANEOUS
  Administered 2022-12-05: 4 [IU] via SUBCUTANEOUS
  Administered 2022-12-05: 7 [IU] via SUBCUTANEOUS
  Administered 2022-12-06: 11 [IU] via SUBCUTANEOUS
  Administered 2022-12-06: 3 [IU] via SUBCUTANEOUS
  Administered 2022-12-06 – 2022-12-07 (×2): 4 [IU] via SUBCUTANEOUS
  Administered 2022-12-07 (×2): 11 [IU] via SUBCUTANEOUS
  Administered 2022-12-08: 4 [IU] via SUBCUTANEOUS
  Administered 2022-12-08: 15 [IU] via SUBCUTANEOUS
  Administered 2022-12-08: 11 [IU] via SUBCUTANEOUS
  Administered 2022-12-09: 4 [IU] via SUBCUTANEOUS
  Administered 2022-12-09 – 2022-12-10 (×3): 7 [IU] via SUBCUTANEOUS

## 2022-12-03 MED ORDER — INSULIN GLARGINE-YFGN 100 UNIT/ML ~~LOC~~ SOLN
20.0000 [IU] | Freq: Every day | SUBCUTANEOUS | Status: DC
  Administered 2022-12-03 – 2022-12-04 (×2): 20 [IU] via SUBCUTANEOUS
  Filled 2022-12-03 (×2): qty 0.2

## 2022-12-03 MED ORDER — HEPARIN BOLUS VIA INFUSION
4000.0000 [IU] | Freq: Once | INTRAVENOUS | Status: AC
  Administered 2022-12-03: 4000 [IU] via INTRAVENOUS

## 2022-12-03 MED ORDER — ATORVASTATIN CALCIUM 80 MG PO TABS
80.0000 mg | ORAL_TABLET | Freq: Every day | ORAL | Status: DC
  Administered 2022-12-03 – 2022-12-10 (×8): 80 mg via ORAL
  Filled 2022-12-03 (×5): qty 1
  Filled 2022-12-03: qty 2
  Filled 2022-12-03 (×3): qty 1

## 2022-12-03 MED ORDER — LOPERAMIDE HCL 2 MG PO CAPS: 2.0000 mg | ORAL_CAPSULE | ORAL | Status: AC | PRN

## 2022-12-03 MED ORDER — ASPIRIN 325 MG PO TABS
325.0000 mg | ORAL_TABLET | Freq: Once | ORAL | Status: AC
  Administered 2022-12-03: 325 mg via ORAL
  Filled 2022-12-03: qty 1

## 2022-12-03 MED ORDER — SODIUM CHLORIDE 0.9 % IV BOLUS
20.0000 mL/kg | Freq: Once | INTRAVENOUS | Status: AC
  Administered 2022-12-03: 1868 mL via INTRAVENOUS

## 2022-12-03 MED ORDER — HEPARIN (PORCINE) 25000 UT/250ML-% IV SOLN
1150.0000 [IU]/h | INTRAVENOUS | Status: DC
  Administered 2022-12-03: 1200 [IU]/h via INTRAVENOUS
  Administered 2022-12-04 – 2022-12-06 (×3): 1150 [IU]/h via INTRAVENOUS
  Filled 2022-12-03 (×4): qty 250

## 2022-12-03 NOTE — Progress Notes (Addendum)
Christopher [name] is a 76 y.o. male with medical history significant of HTN, IDDM, HLD, GERD, glaucoma   Patient brought in by his wife with nausea vomiting, altered mentation and increased work of breathing to [facility].  Patient stated that he felt as he did when he had DKA in April.  Labs revealed patient in DKA.  Fingerstick blood sugar greater than 1200, creatinine 3 with troponin of 400, potassium 6.2..  There is no reports of chest pains.  EKG tachycardic up to 130s.  No EKG changes.  DKA order set initiated.  Heparin drip initiated.  Cardiology consult placed by EDP.  Will continue insulin and heparin drip.  Elevated troponins likely secondary to demand however given patient's risk factors of diabetes, hypertension, dyslipidemia will continue heparin drip until patient is seen by cardiology. BMP every 4 hours, magnesium and phosphorus levels every 12.  First collection now.  Patient is on potassium supplement and lisinopril.  Both have been held.  N.p.o., Zofran as needed

## 2022-12-03 NOTE — Progress Notes (Signed)
Cardiology Consultation   Patient ID: DEMARRIUS [name] MRN: [id number]; DOB: 08/17/1946  Admit date: 12/03/2022 Date of Consult: 12/03/2022  PCP:  Christopher [name], [place] Providers Cardiologist:  None        Patient Profile:   Christopher [name] is a 76 y.o. male with a hx of diabetes who is being seen 12/03/2022 for the evaluation of elevated troponin at the request of Dr. Roosevelt [name].  History of Present Illness:   Christopher [name] has had diabetes for over 10 years.  Over the last several days, he had issues with low blood sugar followed by very high blood sugar.  He is being treated for DKA.  Glucose was greater than 1200.  Troponin was found to be elevated.  He had some ST depression noted on ECG.  He has been in acute renal failure.  He has been receiving significant IV fluids.  At times, he has been confused.  Today, he denies chest discomfort.  He had been having nausea and vomiting prior to admission for elevated blood sugar.  No prior cardiac workup.  He was in the emergency room for chest discomfort several weeks ago and had a negative workup at [facility].  No prior stress test or echocardiogram available.   Past Medical History:  Diagnosis Date   Diabetes mellitus type II    GERD (gastroesophageal reflux disease)    Glaucoma    Hyperlipidemia    Hypertension     Past Surgical History:  Procedure Laterality Date   COLONOSCOPY     HERNIA REPAIR     PATELLA FRACTURE SURGERY  1993       Inpatient Medications: Scheduled Meds:  aspirin  81 mg Oral Daily   atorvastatin  80 mg Oral Daily   brimonidine  1 drop Both Eyes BID   dorzolamide-timolol  1 drop Both Eyes BID   Continuous Infusions:  dextrose 5% lactated ringers Stopped (12/03/22 0448)   heparin Stopped (12/03/22 1454)   insulin 22 Units/hr (12/03/22 1503)   lactated ringers 125 mL/hr at 12/03/22 1211   sodium chloride     PRN Meds: dextrose, loperamide, OLANZapine, ondansetron  (ZOFRAN) IV  Allergies:   No Known Allergies  Social History:   Social History   Socioeconomic History   Marital status: Married    Spouse name: Not on file   Number of children: Not on file   Years of education: Not on file   Highest education level: Not on file  Occupational History   Not on file  Tobacco Use   Smoking status: Never   Smokeless tobacco: Never  Vaping Use   Vaping Use: Never used  Substance and Sexual Activity   Alcohol use: No   Drug use: No   Sexual activity: Not on file  Other Topics Concern   Not on file  Social History Narrative   Not on file   Social Determinants of Health   Financial Resource Strain: Not on file  Food Insecurity: No Food Insecurity (10/06/2022)   Hunger Vital Sign    Worried About Running Out of Food in the Last Year: Never true    Ran Out of Food in the Last Year: Never true  Transportation Needs: No Transportation Needs (10/06/2022)   PRAPARE - Hydrologist (Medical): No    Lack of Transportation (Non-Medical): No  Physical Activity: Sufficiently Active (05/10/2020)   Exercise Vital Sign    Days of  Exercise per Week: 5 days    Minutes of Exercise per Session: 60 min  Stress: Not on file  Social Connections: Not on file  Intimate Partner Violence: Not At Risk (10/06/2022)   Humiliation, Afraid, Rape, and Kick questionnaire    Fear of Current or Ex-Partner: No    Emotionally Abused: No    Physically Abused: No    Sexually Abused: No    Family History:    Family History  Problem Relation Age of Onset   Alcohol abuse Father    Diabetes Sister    Colon cancer Neg Hx    Esophageal cancer Neg Hx    Stomach cancer Neg Hx    Rectal cancer Neg Hx      ROS:  Please see the history of present illness.  Confusion All other ROS reviewed and negative.     Physical Exam/Data:   Vitals:   12/03/22 1100 12/03/22 1115 12/03/22 1200 12/03/22 1512  BP: 118/79 117/69 111/77 115/71  Pulse: (!)  139 92  86  Resp: (!) 28 (!) 25  17  Temp:   98 F (36.7 C) 98.5 F (36.9 C)  TempSrc:   Oral Oral  SpO2: 99% 100% 96% 96%  Weight:      Height:        Intake/Output Summary (Last 24 hours) at 12/03/2022 1541 Last data filed at 12/03/2022 1500 Gross per 24 hour  Intake 4182.35 ml  Output 780 ml  Net 3402.35 ml      12/03/2022    3:47 AM 11/29/2022    9:00 AM 11/18/2022    6:23 PM  Last 3 Weights  Weight (lbs) 206 lb 206 lb 12.7 oz 208 lb  Weight (kg) 93.441 kg 93.8 kg 94.348 kg     Body mass index is 25.75 kg/m.  General:  Well nourished, well developed, in no acute distress HEENT: normal Neck: no JVD Vascular: No carotid bruits; Distal pulses 2+ bilaterally Cardiac:  normal S1, S2; RRR; no murmur  Lungs:  clear to auscultation bilaterally, no wheezing, rhonchi or rales  Abd: soft, nontender, no hepatomegaly  Ext: no edema Musculoskeletal:  No deformities, BUE and BLE strength normal and equal Skin: warm and dry  Neuro:  CNs 2-12 intact, no focal abnormalities noted Psych:  Normal affect   EKG:  The EKG was personally reviewed and demonstrates: Normal sinus rhythm with inferolateral ST depression Telemetry:  Telemetry was personally reviewed and demonstrates: Normal sinus rhythm  Relevant CV Studies: Echo pending  Laboratory Data:  High Sensitivity Troponin:   Recent Labs  Lab 12/03/22 0400 12/03/22 0605 12/03/22 0758 12/03/22 1230  TROPONINIHS 491* 584* 1,021* 2,884*     Chemistry Recent Labs  Lab 12/03/22 0400 12/03/22 0406 12/03/22 0758 12/03/22 1230  NA 129* 126* 138 140  K 6.6* 6.4* 4.6 4.0  CL 85*  --  100 108  CO2 7*  --  9* 16*  GLUCOSE >1,200*  --  978* 534*  BUN 60*  --  61* 56*  CREATININE 3.27*  --  3.03* 2.97*  CALCIUM 8.9  --  8.0* 8.2*  MG  --   --  2.2  --   GFRNONAA 19*  --  21* 21*  ANIONGAP 37*  --  29* 16*    Recent Labs  Lab 11/29/22 0956  PROT 5.9*  ALBUMIN 3.4*  AST 20  ALT 16  ALKPHOS 72  BILITOT 0.9    Lipids No results for input(s): "CHOL", "TRIG", "HDL", "  LABVLDL", "LDLCALC", "CHOLHDL" in the last 168 hours.  Hematology Recent Labs  Lab 11/29/22 0956 12/03/22 0400 12/03/22 0406  WBC 5.8 19.4*  --   RBC 4.88 4.92  --   HGB 12.6* 12.7* 13.9  HCT 36.4* 38.7* 41.0  MCV 74.6* 78.7*  --   MCH 25.8* 25.8*  --   MCHC 34.6 32.8  --   RDW 14.3 15.3  --   PLT 214 310  --    Thyroid No results for input(s): "TSH", "FREET4" in the last 168 hours.  BNPNo results for input(s): "BNP", "PROBNP" in the last 168 hours.  DDimer No results for input(s): "DDIMER" in the last 168 hours.   Radiology/Studies:  DG Chest 1 View  Result Date: 12/03/2022 CLINICAL DATA:  Non ST elevated myocardial infarction. EXAM: CHEST  1 VIEW COMPARISON:  Same day. FINDINGS: The heart size and mediastinal contours are within normal limits. Both lungs are clear. The visualized skeletal structures are unremarkable. IMPRESSION: No active disease. Electronically Signed   By: Marijo Conception M.D.   On: 12/03/2022 14:45   DG Chest Portable 1 View  Result Date: 12/03/2022 CLINICAL DATA:  Leukocytosis. EXAM: PORTABLE CHEST 1 VIEW COMPARISON:  10/07/2022 FINDINGS: Heart size and mediastinal contours are unremarkable. No pleural effusion or edema. No airspace opacities identified. Visualized osseous structures appear intact. IMPRESSION: No active disease. Electronically Signed   By: Kerby Moors M.D.   On: 12/03/2022 05:18     Assessment and Plan:   Elevated troponin: Patient denies any chest discomfort.  Difficult to know whether this is from demand ischemia in the setting of DKA.  Await echocardiogram result to look for regional wall motion abnormality.  Given his troponin elevation, catheterization is indicated but his creatinine is well above baseline. Acute renal failure: Creatinine was 1.3 four days ago.  It is now trending down from a value of 3.3, currently down to 2.97. Hypertension: Blood pressure controlled.   Would have to hold ARB and ACE inhibitor's at this time. Hyperlipidemia: Continue high-dose atorvastatin. Further workup based on echo result.  Continue IV heparin for now.   Risk Assessment/Risk Scores:     TIMI Risk Score for Unstable Angina or Non-ST Elevation MI:   The patient's TIMI risk score is 5, which indicates a 26% risk of all cause mortality, new or recurrent myocardial infarction or need for urgent revascularization in the next 14 days.          For questions or updates, please contact [place] Please consult [email or web address] for contact info under    Signed, Larae Grooms, MD  12/03/2022 3:41 PM

## 2022-12-03 NOTE — ED Notes (Signed)
Called [facility] and spoke to [place]; informed that the patient Bed Assignment is ready.

## 2022-12-03 NOTE — H&P (Signed)
History and Physical    Christopher [name] OMV:[id number] DOB: [birth year] DOA: 12/03/2022  PCP: Isaac Bliss, Rayford Halsted, MD (Confirm with patient/family/NH records and if not entered, this has to be entered at [facility] point of entry) Patient coming from: Home  I have personally briefly reviewed patient's old medical records in [place]  Chief Complaint: N/V, diarrhea  HPI: Christopher [name] is a 76 y.o. male with medical history significant of IDDM with insulin resistance, HTN, HLD, glaucoma, GERD, CKD stage II, brought in by family member for confusion and hyperglycemia.  Symptoms started Saturday/Sunday, patient started to develop cramping-like epigastric pain along with very nauseous and frequent vomiting of stomach content nonbilious nonbloody, and loose bowel movement but denies any nystagmus.  He also runs episodes of subjective fever and chills and symptoms of Monday.  During which time, patient did not take any of his insulins for last 3 days.  Feels nauseous vomiting and abdominal pain symptoms significantly resolved this morning and started to feel hungry but he continued to have diarrhea 2 times this morning which is watery.  Family checked his glucose and midnight which was > 600 and patient was seen breathing fast, decided to bring him into the ED.  He denied any chest pain, no shortness of breath.  ED Course: Tachycardia, borderline hypotensive not hypoxic.  Lab work showed fingerstick more than thousand, bicarb 9, creatinine 0.0 compared to baseline 1.4 last week, glucose 978, beta hydroxy elevated.  Patient received a total of 4.8 L IV bolus and insulin drip started in the ED this morning.  Initial EKG showed ST depression 2, 3 and aVF with heart rate in the 130s, troponins 491> 581 cardiology consulted, heparin drip started in the ED.  Review of Systems: As per HPI otherwise 14 point review of systems negative.    Past Medical History:  Diagnosis Date   Diabetes mellitus  type II    GERD (gastroesophageal reflux disease)    Glaucoma    Hyperlipidemia    Hypertension     Past Surgical History:  Procedure Laterality Date   COLONOSCOPY     HERNIA REPAIR     [place]     reports that he has never smoked. He has never used smokeless tobacco. He reports that he does not drink alcohol and does not use drugs.  No Known Allergies  Family History  Problem Relation Age of Onset   Alcohol abuse Father    Diabetes Sister    Colon cancer Neg Hx    Esophageal cancer Neg Hx    Stomach cancer Neg Hx    Rectal cancer Neg Hx      Prior to Admission medications   Medication Sig Start Date End Date Taking? Authorizing Provider  aspirin (ASPIRIN CHILDRENS) 81 MG chewable tablet Chew 1 tablet (81 mg total) by mouth daily. 10/07/22 01/05/23 Yes Vashti Hey, MD  atorvastatin (LIPITOR) 80 MG tablet Take 1 tablet (80 mg total) by mouth daily. 03/25/22  Yes Little Ishikawa, MD  brimonidine (ALPHAGAN) 0.15 % ophthalmic solution Place 1 drop into both eyes 2 (two) times daily.   Yes [provider]  Continuous Blood Gluc Receiver (DEXCOM G7 RECEIVER) DEVI Use to check blood sugar 11/19/22   Elayne Snare, MD  Continuous Blood Gluc Sensor (DEXCOM G6 SENSOR) MISC Change every 10 days 11/19/22   Elayne Snare, MD  Continuous Blood Gluc Transmit (DEXCOM G6 TRANSMITTER) MISC Use every 3 months 11/19/22  Elayne Snare, MD  dorzolamide-timolol (COSOPT) 22.3-6.8 MG/ML ophthalmic solution Place 1 drop into both eyes 2 (two) times daily.  03/03/11  Yes [provider]  insulin aspart (NOVOLOG FLEXPEN) 100 UNIT/ML FlexPen Inject 8-10 Units into the skin 3 (three) times daily with meals. Patient taking differently: Inject 10 Units into the skin 3 (three) times daily with meals. 08/14/22  Yes Elayne Snare, MD  insulin glargine (LANTUS SOLOSTAR) 100 UNIT/ML Solostar Pen Inject 20-38 Units into the skin See admin instructions. 38 units in the  morning, 20 units in the evening   Yes [provider]  Insulin Pen Needle 32G X 4 MM MISC Use as instructed to administer insulin 4-5X daily. E10.65 10/29/22   Elayne Snare, MD  lisinopril (ZESTRIL) 5 MG tablet Take 1 tablet (5 mg total) by mouth daily. 05/15/22  Yes Isaac Bliss, Rayford Halsted, MD  Multiple Vitamins-Minerals (MULTIVITAMIN ADULTS 50+) TABS Take 1 tablet by mouth daily.   Yes [provider]  Continuous Blood Gluc Receiver (FREESTYLE LIBRE 2 READER) DEVI 1 each by Does not apply route daily. 04/19/22   Renato Shin, MD  Continuous Blood Gluc Sensor (FREESTYLE LIBRE 2 SENSOR) MISC 1 Device by Does not apply route every 14 (fourteen) days. 04/03/22   Isaac Bliss, Rayford Halsted, MD  glucose blood [facility] VERIO) test strip 1 each by Other route 2 (two) times daily. And lancets 2/day 04/19/22   Renato Shin, MD  Insulin Syringe-Needle U-100 (BD VEO INSULIN SYRINGE U/F) 31G X 15/64" 1 ML MISC USE  SYRINGE TWICE DAILY 07/29/22   Elayne Snare, MD  potassium chloride (KLOR-CON) 10 MEQ tablet Take 1 tablet (10 mEq total) by mouth daily. 10 mEq daily Patient taking differently: Take 10 mEq by mouth daily. 08/20/22   Elayne Snare, MD    Physical Exam: Vitals:   12/03/22 1043 12/03/22 1100 12/03/22 1115 12/03/22 1200  BP:  118/79 117/69 111/77  Pulse:  (!) 139 92   Resp:  (!) 28 (!) 25   Temp: 97.8 F (36.6 C)   98 F (36.7 C)  TempSrc:    Oral  SpO2:  99% 100% 96%  Weight:      Height:        Constitutional: NAD, calm, comfortable Vitals:   12/03/22 1043 12/03/22 1100 12/03/22 1115 12/03/22 1200  BP:  118/79 117/69 111/77  Pulse:  (!) 139 92   Resp:  (!) 28 (!) 25   Temp: 97.8 F (36.6 C)   98 F (36.7 C)  TempSrc:    Oral  SpO2:  99% 100% 96%  Weight:      Height:       Eyes: PERRL, lids and conjunctivae normal ENMT: Mucous membranes are dry. Posterior pharynx clear of any exudate or lesions.Normal dentition.  Neck: normal, supple, no masses, no  thyromegaly Respiratory: clear to auscultation bilaterally, no wheezing, no crackles. Normal respiratory effort. No accessory muscle use.  Cardiovascular: Regular rate and rhythm, no murmurs / rubs / gallops. No extremity edema. 2+ pedal pulses. No carotid bruits.  Abdomen: no tenderness, no masses palpated. No hepatosplenomegaly. Bowel sounds positive.  Musculoskeletal: no clubbing / cyanosis. No joint deformity upper and lower extremities. Good ROM, no contractures. Normal muscle tone.  Skin: no rashes, lesions, ulcers. No induration Neurologic: CN 2-12 grossly intact. Sensation intact, DTR normal. Strength 5/5 in all 4.  Psychiatric: Agitated, awake, oriented to person and place, confused about time.     Labs on Admission: I have personally reviewed  following labs and imaging studies  CBC: Recent Labs  Lab 11/29/22 0918 11/29/22 0956 12/03/22 0400 12/03/22 0406  WBC  --  5.8 19.4*  --   NEUTROABS  --  4.0 16.2*  --   HGB 13.9 12.6* 12.7* 13.9  HCT 41.0 36.4* 38.7* 41.0  MCV  --  74.6* 78.7*  --   PLT  --  214 310  --    Basic Metabolic Panel: Recent Labs  Lab 11/29/22 0918 11/29/22 0956 12/03/22 0400 12/03/22 0406 12/03/22 0758  NA 140 136 129* 126* 138  K 3.8 4.4 6.6* 6.4* 4.6  CL 110 105 85*  --  100  CO2  --  22 7*  --  9*  GLUCOSE 167* 267* >1,200*  --  978*  BUN 19 18 60*  --  61*  CREATININE 1.30* 1.36* 3.27*  --  3.03*  CALCIUM  --  8.5* 8.9  --  8.0*  MG  --   --   --   --  2.2  PHOS  --   --   --   --  5.4*   GFR: Estimated Creatinine Clearance: 25.2 mL/min (A) (by C-G formula based on SCr of 3.03 mg/dL (H)). Liver Function Tests: Recent Labs  Lab 11/29/22 0956  AST 20  ALT 16  ALKPHOS 72  BILITOT 0.9  PROT 5.9*  ALBUMIN 3.4*   No results for input(s): "LIPASE", "AMYLASE" in the last 168 hours. No results for input(s): "AMMONIA" in the last 168 hours. Coagulation Profile: Recent Labs  Lab 11/29/22 0956  INR 1.2   Cardiac Enzymes: No  results for input(s): "CKTOTAL", "CKMB", "CKMBINDEX", "TROPONINI" in the last 168 hours. BNP (last 3 results) No results for input(s): "PROBNP" in the last 8760 hours. HbA1C: No results for input(s): "HGBA1C" in the last 72 hours. CBG: Recent Labs  Lab 12/03/22 0831 12/03/22 0912 12/03/22 1100 12/03/22 1155 12/03/22 1300  GLUCAP >600* >600* 479* 530* 509*   Lipid Profile: No results for input(s): "CHOL", "HDL", "LDLCALC", "TRIG", "CHOLHDL", "LDLDIRECT" in the last 72 hours. Thyroid Function Tests: No results for input(s): "TSH", "T4TOTAL", "FREET4", "T3FREE", "THYROIDAB" in the last 72 hours. Anemia Panel: No results for input(s): "VITAMINB12", "FOLATE", "FERRITIN", "TIBC", "IRON", "RETICCTPCT" in the last 72 hours. Urine analysis:    Component Value Date/Time   COLORURINE COLORLESS (A) 12/03/2022 0600   APPEARANCEUR CLEAR 12/03/2022 0600   LABSPEC 1.022 12/03/2022 0600   PHURINE 5.0 12/03/2022 0600   GLUCOSEU >1,000 (A) 12/03/2022 0600   GLUCOSEU 500 08/25/2013 1450   HGBUR NEGATIVE 12/03/2022 0600   HGBUR negative 02/06/2009 1156   BILIRUBINUR NEGATIVE 12/03/2022 0600   BILIRUBINUR N 03/25/2017 1053   KETONESUR 15 (A) 12/03/2022 0600   PROTEINUR NEGATIVE 12/03/2022 0600   UROBILINOGEN 0.2 03/25/2017 1053   UROBILINOGEN 0.2 08/25/2013 1450   NITRITE NEGATIVE 12/03/2022 0600   LEUKOCYTESUR NEGATIVE 12/03/2022 0600    Radiological Exams on Admission: DG Chest Portable 1 View  Result Date: 12/03/2022 CLINICAL DATA:  Leukocytosis. EXAM: PORTABLE CHEST 1 VIEW COMPARISON:  10/07/2022 FINDINGS: Heart size and mediastinal contours are unremarkable. No pleural effusion or edema. No airspace opacities identified. Visualized osseous structures appear intact. IMPRESSION: No active disease. Electronically Signed   By: Kerby Moors M.D.   On: 12/03/2022 05:18    EKG: Independently reviewed.  Sinus tachycardia, ST depression on lead II lead III and aVF, initially around 3 AM,  repeat EKG around 1300 showed persistent ST depression on lead to 3  and aVF, additional T wave inversions on V5 and V6.  Assessment/Plan Principal Problem:   DKA (diabetic ketoacidosis) ([place]) Active Problems:   Gastroenteritis   Elevated troponin   AKI (acute kidney injury) ([place])  (please populate well all problems here in Problem List. (For example, if patient is on BP meds at home and you resume or decide to hold them, it is a problem that needs to be her. Same for CAD, COPD, HLD and so on)   DKA -Clinically appears to be dehydrated and volume contracted, will give 1 more liter of IV bolus -Continue insulin drip -BMP every 4 hours  Delirium -Likely secondary to DKA with severe acidosis -Treat DKA then reevaluate, right now, there is no focal neurological deficit on physical exam -Nurse reported patient started to pull IV and other equipment, will start on soft restraint for tonight and as needed Zyprexa  NSTEMI -No chest pain but significant elevation of troponins 490> 581> 1000 with persistent ST changes depressions and T wave inversions on 2 3 aVF in the morning and additional ST depression and T inversion on V5 and V6 this afternoon.  Discussed with on-call cardiology, who will see the patient on consult. -Continue heparin drip. -ASA, statin -Echo -Resume lisinopril when kidney function stabilized.  AKI on CKD stage II -Likely prerenal from volume contraction and dehydration from repeated nauseous vomiting and diarrhea from putative gastroenteritis -Clinically appears to be dry, will give 1 more liter of bolus, and continue high rate of IV fluids. -Check x-ray to rule out CHF, as patient has NSTEMI  SIRS -Secondary to DKA and possibly also reactive to gastroenteritis -Chest x-ray no significant infiltrates, UA showed no pyuria and diarrhea likely part of the GI symptoms from gastroenteritis, hold off antibiotics. -CBC in AM  Probably acute gastroenteritis -Symptoms  started Saturday-Sunday, most of the upper GI symptoms including nauseous vomiting have improved but still having diarrhea.  On physical exam, abdomen exam benign, will start trial of Imodium, no indication for further GI workup    DVT prophylaxis: Heparin drip Code Status: Full code Family Communication: Wife over phone Disposition Plan: Patient sick with DKA and NSTEMI, requiring inpatient cardiology treatment and likely inpatient cardiology workup.  Expect less than 2 midnight hospital stay Consults called: Cardiology Admission status: PCU   Lequita Halt MD [facility] Pager [phone]  12/03/2022, 1:25 PM

## 2022-12-03 NOTE — ED Notes (Signed)
Critical VBG results given to EDP. No respiratory orders given at this time.

## 2022-12-03 NOTE — ED Triage Notes (Signed)
Elevated blood sugar, cbg at home read high.  Patient states he's having they same symptoms of breathing fast when he was in DKA previously.

## 2022-12-03 NOTE — Plan of Care (Signed)
  Problem: Safety: Goal: Non-violent Restraint(s) Outcome: Not Progressing Note: Patient still appears confused, getting OOB and removing tube/lines.

## 2022-12-03 NOTE — ED Notes (Signed)
Pt vomiting. Provider aware. Medication ordered

## 2022-12-03 NOTE — ED Notes (Signed)
Pt's wife, Onalee Hua, went home to lock up house, but will be right back. She asked that we call her with any updates and/or changes [phone]

## 2022-12-03 NOTE — ED Notes (Signed)
DR Horton at bedside

## 2022-12-03 NOTE — Telephone Encounter (Signed)
Patient's wife, Christopher [name], called and stated that Christopher [name] has been admitted again with DKA. He was not checking his blood glucose as he ran out of lancets- will message Dr. Ronnie Derby nurse for a prescription for this.  He has a prescription for Dexcom G6 coming from [facility].  Christopher [name] will check with [employer] regarding this. She was inquiring about the status of the Omnipod 5 as well.  She has worked directly with the company and will call them.  I will inquire about an order for a starter kit as well.  She has received emails from [facility] prior to our first visit. Christopher [name] struggles with consistency with his meal time insulin.  Discussed importance of consistency.  Patient or wife to call as needed.  They will continue to participate in the Type 1 Support Group.  Antonieta Iba, RD, LDN, CDCES

## 2022-12-03 NOTE — Progress Notes (Signed)
ANTICOAGULATION CONSULT NOTE - Initial Consult  Pharmacy Consult for Heparin  Indication: chest pain/ACS, elevated troponin   No Known Allergies  Patient Measurements: Height: '6\' 3"'$  (190.5 cm) Weight: 93.4 kg (206 lb) IBW/kg (Calculated) : 84.5  Vital Signs: Temp: 97.8 F (36.6 C) (12/19 0350) Temp Source: Oral (12/19 0350) BP: 98/69 (12/19 0620) Pulse Rate: 139 (12/19 0620)  Labs: Recent Labs    12/03/22 0400 12/03/22 0406  HGB 12.7* 13.9  HCT 38.7* 41.0  PLT 310  --   CREATININE 3.27*  --   TROPONINIHS 491*  --     Estimated Creatinine Clearance: 23.3 mL/min (A) (by C-G formula based on SCr of 3.27 mg/dL (H)).   Medical History: Past Medical History:  Diagnosis Date   Diabetes mellitus type II    GERD (gastroesophageal reflux disease)    Glaucoma    Hyperlipidemia    Hypertension       Assessment: 76 y/o M with hyperglycemia and elevated troponin, starting heparin for now, Hgb 12.7, noted renal dysfunction, PTA meds reviewed.   Goal of Therapy:  Heparin level 0.3-0.7 units/ml Monitor platelets by anticoagulation protocol: Yes   Plan:  Heparin 4000 units BOLUS Start heparin drip at 1200 units/hr 8 hour heparin level Daily CBC/Heparin level Monitor for bleeding  Narda Bonds, PharmD, BCPS Clinical Pharmacist Phone: [phone]

## 2022-12-03 NOTE — ED Notes (Addendum)
CBG

## 2022-12-03 NOTE — Progress Notes (Signed)
PAtient came to 4NP03 note confused, removing lines and attempting to get out OOB. Attending on call notified.

## 2022-12-03 NOTE — Progress Notes (Signed)
Patient removed another PIV as he attempt to get OOB. IV team consult d/t difficult start.

## 2022-12-03 NOTE — ED Notes (Signed)
Updated patient's wife about his transport to [facility].

## 2022-12-03 NOTE — ED Notes (Signed)
Change in patient's heartrate from 139 to 94. EKG obtained and shown to Dr Jeanell Sparrow. Ok to transport to [facility]. Dr. Jeanell Sparrow calling admitting services to notify.

## 2022-12-03 NOTE — Progress Notes (Signed)
ANTICOAGULATION CONSULT NOTE  Pharmacy Consult for Heparin  Indication: chest pain/ACS, elevated troponin   No Known Allergies  Patient Measurements: Height: '6\' 3"'$  (190.5 cm) Weight: 93.4 kg (206 lb) IBW/kg (Calculated) : 84.5  Vital Signs: Temp: 98 F (36.7 C) (12/19 1200) Temp Source: Oral (12/19 1200) BP: 111/77 (12/19 1200) Pulse Rate: 92 (12/19 1115)  Labs: Recent Labs    12/03/22 0400 12/03/22 0406 12/03/22 0605 12/03/22 0758 12/03/22 1230  HGB 12.7* 13.9  --   --   --   HCT 38.7* 41.0  --   --   --   PLT 310  --   --   --   --   HEPARINUNFRC  --   --   --   --  0.64  CREATININE 3.27*  --   --  3.03* 2.97*  TROPONINIHS 491*  --  584* 1,021*  --      Estimated Creatinine Clearance: 25.7 mL/min (A) (by C-G formula based on SCr of 2.97 mg/dL (H)).   Medical History: Past Medical History:  Diagnosis Date   Diabetes mellitus type II    GERD (gastroesophageal reflux disease)    Glaucoma    Hyperlipidemia    Hypertension     Assessment: 76 y/o M with hyperglycemia and elevated troponin, starting heparin for now for ACS. Patient is not on anticoagulation PTA. Noted renal dysfunction.  Initial heparin level 0.64, therapeutic but near top of range. Patient lost IV access per RN after lab draw and since replaced with heparin drip resumed at 1420. CBC WNL. No bleed issues reported.  Goal of Therapy:  Heparin level 0.3-0.7 units/ml Monitor platelets by anticoagulation protocol: Yes   Plan:  Reduce heparin drip slightly to 1150 units/hr to ensure stays in range Monitor daily heparin level and CBC, s/sx bleeding   Arturo Morton, PharmD, BCPS Please check AMION for all [place] contact numbers Clinical Pharmacist 12/03/2022 2:36 PM

## 2022-12-03 NOTE — ED Provider Notes (Signed)
[facility] EMERGENCY DEPT Provider Note   CSN: [id number] Arrival date & time: 12/03/22  3532     History  Chief Complaint  Patient presents with   Hyperglycemia    Christopher [name] is a 76 y.o. male.  HPI     This is a 76 year old male with a history of diabetes, hyperlipidemia, hypertension, recent admission for TIA workup who presents with concerns for hyperglycemia and DKA.  Patient has had nausea and vomiting.  Since he was discharged from the hospital on Friday, it is unclear whether he has taken any insulin at all.  Wife states that she thought he had been taking it but he has had some decreased oral intake and she felt like that is why he maybe did not take it earlier today.  She has noted some confusion and increased work of breathing today.  He states that his breathing has increased and he feels like he felt when he was in DKA in April.  Denies abdominal pain but does report nausea and vomiting.  No change in bowels.  Patient denies upper respiratory symptoms or fevers.  Denies chest pain or shortness of breath.  Chart reviewed.  He was admitted to the hospital for TIA workup.  His symptoms were thought to be related to episodes of hypoglycemia.  He was supposed to decrease his insulin.  Again, it is not clear whether he has taken any insulin since discharge on Friday.  Home Medications Prior to Admission medications   Medication Sig Start Date End Date Taking? Authorizing Provider  Continuous Blood Gluc Receiver (DEXCOM G7 RECEIVER) DEVI Use to check blood sugar 11/19/22   Elayne Snare, MD  Continuous Blood Gluc Sensor (DEXCOM G6 SENSOR) MISC Change every 10 days 11/19/22   Elayne Snare, MD  Continuous Blood Gluc Transmit (DEXCOM G6 TRANSMITTER) MISC Use every 3 months 11/19/22   Elayne Snare, MD  Insulin Pen Needle 32G X 4 MM MISC Use as instructed to administer insulin 4-5X daily. E10.65 10/29/22   Elayne Snare, MD  Acetaminophen (TYLENOL PO) Take 1-2 tablets by  mouth daily as needed (pain, headache).    [provider]  aspirin (ASPIRIN CHILDRENS) 81 MG chewable tablet Chew 1 tablet (81 mg total) by mouth daily. 10/07/22 01/05/23  Vashti Hey, MD  atorvastatin (LIPITOR) 80 MG tablet Take 1 tablet (80 mg total) by mouth daily. 03/25/22   Little Ishikawa, MD  brimonidine (ALPHAGAN) 0.15 % ophthalmic solution Place 1 drop into both eyes 2 (two) times daily.    [provider]  Continuous Blood Gluc Receiver (FREESTYLE LIBRE 2 READER) DEVI 1 each by Does not apply route daily. 04/19/22   Renato Shin, MD  Continuous Blood Gluc Sensor (FREESTYLE LIBRE 2 SENSOR) MISC 1 Device by Does not apply route every 14 (fourteen) days. 04/03/22   Isaac Bliss, Rayford Halsted, MD  dorzolamide-timolol (COSOPT) 22.3-6.8 MG/ML ophthalmic solution Place 1 drop into both eyes 2 (two) times daily.  03/03/11   [provider]  glucose blood (ONETOUCH VERIO) test strip 1 each by Other route 2 (two) times daily. And lancets 2/day 04/19/22   Renato Shin, MD  insulin aspart (NOVOLOG FLEXPEN) 100 UNIT/ML FlexPen Inject 8-10 Units into the skin 3 (three) times daily with meals. Patient taking differently: Inject 8 Units into the skin 3 (three) times daily with meals. 08/14/22   Elayne Snare, MD  insulin glargine (LANTUS SOLOSTAR) 100 UNIT/ML Solostar Pen Inject 24-34 Units into the skin See admin instructions.  34 units in the morning, 24 units in the evening    [provider]  Insulin Syringe-Needle U-100 (BD VEO INSULIN SYRINGE U/F) 31G X 15/64" 1 ML MISC USE  SYRINGE TWICE DAILY 07/29/22   Elayne Snare, MD  lisinopril (ZESTRIL) 5 MG tablet Take 1 tablet (5 mg total) by mouth daily. 05/15/22   Isaac Bliss, Rayford Halsted, MD  Multiple Vitamin (MULTIVITAMIN) tablet Take 1 tablet by mouth daily. Patient not taking: Reported on 10/08/2022    [provider]  Multiple Vitamins-Minerals (MULTIVITAMIN ADULTS 50+) TABS Take 1 tablet by mouth  daily.    [provider]  potassium chloride (KLOR-CON) 10 MEQ tablet Take 1 tablet (10 mEq total) by mouth daily. 10 mEq daily Patient taking differently: Take 10 mEq by mouth daily. 08/20/22   Elayne Snare, MD      Allergies    Patient has no known allergies.    Review of Systems   Review of Systems  Gastrointestinal:  Positive for nausea and vomiting.  Psychiatric/Behavioral:  Positive for confusion.   All other systems reviewed and are negative.   Physical Exam Updated Vital Signs BP 101/69   Pulse (!) 135   Temp 98 F (36.7 C) (Oral)   Resp (!) 28   Ht 1.905 m ('6\' 3"'$ )   Wt 93.4 kg   SpO2 100%   BMI 25.75 kg/m  Physical Exam Vitals and nursing note reviewed.  Constitutional:      Appearance: He is well-developed. He is ill-appearing. He is not toxic-appearing.  HENT:     Head: Normocephalic and atraumatic.     Mouth/Throat:     Mouth: Mucous membranes are dry.  Eyes:     Pupils: Pupils are equal, round, and reactive to light.  Cardiovascular:     Rate and Rhythm: Regular rhythm. Tachycardia present.     Heart sounds: Normal heart sounds. No murmur heard. Pulmonary:     Effort: Pulmonary effort is normal. No respiratory distress.     Breath sounds: Normal breath sounds. No wheezing.  Abdominal:     General: Bowel sounds are normal.     Palpations: Abdomen is soft.     Tenderness: There is no abdominal tenderness. There is no rebound.  Musculoskeletal:     Cervical back: Neck supple.  Lymphadenopathy:     Cervical: No cervical adenopathy.  Skin:    General: Skin is warm and dry.  Neurological:     Mental Status: He is alert and oriented to person, place, and time.  Psychiatric:        Mood and Affect: Mood normal.     ED Results / Procedures / Treatments   Labs (all labs ordered are listed, but only abnormal results are displayed) Labs Reviewed  CBC WITH DIFFERENTIAL/PLATELET - Abnormal; Notable for the following components:      Result Value    WBC 19.4 (*)    Hemoglobin 12.7 (*)    HCT 38.7 (*)    MCV 78.7 (*)    MCH 25.8 (*)    Neutro Abs 16.2 (*)    Monocytes Absolute 1.2 (*)    Abs Immature Granulocytes 0.19 (*)    All other components within normal limits  BASIC METABOLIC PANEL - Abnormal; Notable for the following components:   Sodium 129 (*)    Potassium 6.6 (*)    Chloride 85 (*)    CO2 7 (*)    Glucose, Bld >1,200 (*)    BUN 60 (*)  Creatinine, Ser 3.27 (*)    GFR, Estimated 19 (*)    Anion gap 37 (*)    All other components within normal limits  URINALYSIS, ROUTINE W REFLEX MICROSCOPIC - Abnormal; Notable for the following components:   Color, Urine COLORLESS (*)    Glucose, UA >1,000 (*)    Ketones, ur 15 (*)    Bacteria, UA RARE (*)    All other components within normal limits  CBG MONITORING, ED - Abnormal; Notable for the following components:   Glucose-Capillary >600 (*)    All other components within normal limits  I-STAT VENOUS BLOOD GAS, ED - Abnormal; Notable for the following components:   pH, Ven 7.156 (*)    pCO2, Ven 21.5 (*)    Bicarbonate 7.6 (*)    TCO2 8 (*)    Acid-base deficit 19.0 (*)    Sodium 126 (*)    Potassium 6.4 (*)    Calcium, Ion 1.05 (*)    All other components within normal limits  CBG MONITORING, ED - Abnormal; Notable for the following components:   Glucose-Capillary >600 (*)    All other components within normal limits  CBG MONITORING, ED - Abnormal; Notable for the following components:   Glucose-Capillary >600 (*)    All other components within normal limits  CBG MONITORING, ED - Abnormal; Notable for the following components:   Glucose-Capillary >600 (*)    All other components within normal limits  CBG MONITORING, ED - Abnormal; Notable for the following components:   Glucose-Capillary >600 (*)    All other components within normal limits  TROPONIN I (HIGH SENSITIVITY) - Abnormal; Notable for the following components:   Troponin I (High Sensitivity) 491  (*)    All other components within normal limits  TROPONIN I (HIGH SENSITIVITY) - Abnormal; Notable for the following components:   Troponin I (High Sensitivity) 584 (*)    All other components within normal limits  RESP PANEL BY RT-PCR (RSV, FLU A&B, COVID)  [id number]  BETA-HYDROXYBUTYRIC ACID  BETA-HYDROXYBUTYRIC ACID  BETA-HYDROXYBUTYRIC ACID  HEPARIN LEVEL (UNFRACTIONATED)    EKG None  ED ECG REPORT   Date: 12/03/2022  Rate: 133  Rhythm: sinus tachycardia  QRS Axis: normal  Intervals: normal  ST/T Wave abnormalities:  DIffuse ST changes  Conduction Disutrbances:none  Narrative Interpretation:   Old EKG Reviewed: changes noted  I have personally reviewed the EKG tracing and agree with the computerized printout as noted.  Radiology DG Chest Portable 1 View  Result Date: 12/03/2022 CLINICAL DATA:  Leukocytosis. EXAM: PORTABLE CHEST 1 VIEW COMPARISON:  10/07/2022 FINDINGS: Heart size and mediastinal contours are unremarkable. No pleural effusion or edema. No airspace opacities identified. Visualized osseous structures appear intact. IMPRESSION: No active disease. Electronically Signed   By: Kerby Moors M.D.   On: 12/03/2022 05:18    Procedures .Critical Care  Performed by: Merryl Hacker, MD Authorized by: Merryl Hacker, MD   Critical care provider statement:    Critical care time (minutes):  65   Critical care was necessary to treat or prevent imminent or life-threatening deterioration of the following conditions:  Endocrine crisis and renal failure   Critical care was time spent personally by me on the following activities:  Development of treatment plan with patient or surrogate, discussions with consultants, evaluation of patient's response to treatment, examination of patient, ordering and review of laboratory studies, ordering and review of radiographic studies, ordering and performing treatments and interventions, pulse oximetry, re-evaluation of patient's  condition and review of old charts     Medications Ordered in ED Medications  insulin regular, human (MYXREDLIN) 100 units/ 100 mL infusion (13 Units/hr Intravenous New Bag/Given 12/03/22 0447)  lactated ringers infusion ( Intravenous New Bag/Given 12/03/22 0442)  dextrose 5 % in lactated ringers infusion (0 mLs Intravenous Hold 12/03/22 0448)  dextrose 50 % solution 0-50 mL (has no administration in time range)  heparin ADULT infusion 100 units/mL (25000 units/279m) (1,200 Units/hr Intravenous New Bag/Given 12/03/22 0644)  sodium chloride 0.9 % bolus 1,868 mL (0 mLs Intravenous Stopped 12/03/22 0549)  ondansetron (ZOFRAN) injection 4 mg (4 mg Intravenous Given 12/03/22 0416)  sodium chloride 0.9 % bolus 1,000 mL (1,000 mLs Intravenous New Bag/Given 12/03/22 0552)  heparin bolus via infusion 4,000 Units (4,000 Units Intravenous Bolus from Bag 12/03/22 0644)    ED Course/ Medical Decision Making/ A&P Clinical Course as of 12/03/22 0653  Tue Dec 03, 2022  0601 Elevated troponin.  Patient continues to deny chest pain.  He is tachycardic.  Cardiology consulted.  Has not called back yet.  Paged x 2.  He has some diffuse ST changes on EKG but they may be rate related. [CH]  0602 Glucose-Capillary(!!): >600 [CH]  0602 Troponin I (High Sensitivity)(!!): 491 [CH]  0602 Glucose(!!): >1,200 [CH]  0602 Creatinine(!): 3.27 [CH]    Clinical Course User Index [CH] Ryelee Albee, CBarbette Hair MD                           Medical Decision Making Amount and/or Complexity of Data Reviewed Labs: ordered. Decision-making details documented in ED Course. Radiology: ordered.  Risk Prescription drug management. Decision regarding hospitalization.   This patient presents to the ED for concern of altered mental status, increased work of breathing, vomiting, this involves an extensive number of treatment options, and is a complaint that carries with it a high risk of complications and morbidity.  I considered  the following differential and admission for this acute, potentially life threatening condition.  The differential diagnosis includes DKA, acute infection such as viral infection, pneumonia, urinary tract infection, metabolic derangements,   MDM:    This is a 76year old male who presents with concerns for hyperglycemia.  Has had some increasing confusion, work of breathing, vomiting over the last 24 hours.  Was just discharged from hospital on Friday for hypoglycemia and TIA workup.  It is unclear whether he is taking his insulin since discharge.  He is slightly confused but technically oriented.  He does not provide much history.  Denies significant pain including chest pain.  EKG shows some diffuse ST changes but does not meet criteria for STEMI.  He is not having any active chest pain.  May be rate related as he is significantly tachycardic.  Blood glucose is greater than 600.  He was started on fluids.  VBG indicates acidosis with low bicarb.  Presumptive DKA.  He was started on Endo tool.  Troponin is elevated at 491.  Repeat is 584.  He has remained chest pain-free.  I presume this is likely rate related and not a true ACS picture.  However clinically he has significant risk factors.  Will place on heparin drip.  Metabolic panel notable for pseudohyponatremia.  Potassium 6.6.  Glucose greater than 1200.  Creatinine acutely elevated at 3.27.  Clinically patient is very dry.  He received 200 cc of LR.  He was also given an additional normal saline bolus.  He has  a leukocytosis.  COVID and influenza are negative.  Chest x-ray does not show pneumonia.  Urinalysis is not consistent with UTI.  Significant metabolic derangements likely related to DKA.  May be fully insulin indiscretion but cannot rule out other causes.  Will plan for admission to the hospital.  No obvious infectious source.  (Labs, imaging, consults)  Labs: I Ordered, and personally interpreted labs.  The pertinent results include: CBC,  BMP, urinalysis, troponin x 2.  Beta hydroxybutyrate  Imaging Studies ordered: I ordered imaging studies including chest x-ray I independently visualized and interpreted imaging. I agree with the radiologist interpretation  Additional history obtained from chart review.  External records from outside source obtained and reviewed including recent admission  Cardiac Monitoring: The patient was maintained on a cardiac monitor.  I personally viewed and interpreted the cardiac monitored which showed an underlying rhythm of: Sinus tachycardia  Reevaluation: After the interventions noted above, I reevaluated the patient and found that they have :stayed the same  Social Determinants of Health:  lives independently  Disposition: Admit  Co morbidities that complicate the patient evaluation  Past Medical History:  Diagnosis Date   Diabetes mellitus type II    GERD (gastroesophageal reflux disease)    Glaucoma    Hyperlipidemia    Hypertension      Medicines Meds ordered this encounter  Medications   DISCONTD: sodium chloride 0.9 % bolus 1,000 mL   insulin regular, human (MYXREDLIN) 100 units/ 100 mL infusion    Order Specific Question:   EndoTool low target:    Answer:   140    Order Specific Question:   EndoTool high target:    Answer:   180    Order Specific Question:   Type of Diabetes    Answer:   Type 2    Order Specific Question:   Mode of Therapy    Answer:   ENDOX1 for DKA    Order Specific Question:   Start Method    Answer:   EndoTool to calculate   lactated ringers infusion   dextrose 5 % in lactated ringers infusion   dextrose 50 % solution 0-50 mL   sodium chloride 0.9 % bolus 1,868 mL   ondansetron (ZOFRAN) injection 4 mg   sodium chloride 0.9 % bolus 1,000 mL   heparin ADULT infusion 100 units/mL (25000 units/233m)   heparin bolus via infusion 4,000 Units    I have reviewed the patients home medicines and have made adjustments as needed  Problem List / ED  Course: Problem List Items Addressed This Visit       Endocrine   * (Principal) DKA (diabetic ketoacidosis) ([place] [facility]   Relevant Medications   insulin regular, human (MYXREDLIN) 100 units/ 100 mL infusion   Other Visit Diagnoses     AKI (acute kidney injury) ([facility]       Elevated troponin                       Final Clinical Impression(s) / ED Diagnoses Final diagnoses:  Diabetic ketoacidosis without coma associated with other specified diabetes mellitus ([place]  [place] injury) [place]  Elevated troponin    Rx / DC Orders ED Discharge Orders     None         HMerryl Hacker MD 12/03/22 [phone]

## 2022-12-03 NOTE — ED Notes (Signed)
Blood glucose read Hi. 20G right AC was pulled out by patient. No bleeding noted. Restarted IV 20G right upper arm. Blood collected and heparin drip restarted. Attempted to call report. RN not ready for report at this time, will call back.

## 2022-12-04 ENCOUNTER — Inpatient Hospital Stay (HOSPITAL_COMMUNITY): Payer: Medicare HMO

## 2022-12-04 DIAGNOSIS — R079 Chest pain, unspecified: Secondary | ICD-10-CM

## 2022-12-04 DIAGNOSIS — R7989 Other specified abnormal findings of blood chemistry: Secondary | ICD-10-CM

## 2022-12-04 LAB — BASIC METABOLIC PANEL
Anion gap: 29 — ABNORMAL HIGH (ref 5–15)
Anion gap: 8 (ref 5–15)
BUN: 61 mg/dL — ABNORMAL HIGH (ref 8–23)
BUN: 43 mg/dL — ABNORMAL HIGH (ref 8–23)
CO2: 9 mmol/L — ABNORMAL LOW (ref 22–32)
CO2: 21 mmol/L — ABNORMAL LOW (ref 22–32)
Calcium: 8 mg/dL — ABNORMAL LOW (ref 8.9–10.3)
Calcium: 8.1 mg/dL — ABNORMAL LOW (ref 8.9–10.3)
Chloride: 100 mmol/L (ref 98–111)
Chloride: 116 mmol/L — ABNORMAL HIGH (ref 98–111)
Creatinine, Ser: 3.03 mg/dL — ABNORMAL HIGH (ref 0.61–1.24)
Creatinine, Ser: 2.07 mg/dL — ABNORMAL HIGH (ref 0.61–1.24)
GFR, Estimated: 21 mL/min — ABNORMAL LOW (ref >60)
GFR, Estimated: 33 mL/min — ABNORMAL LOW (ref >60)
Glucose, Bld: 978 mg/dL (ref 70–99)
Glucose, Bld: 246 mg/dL — ABNORMAL HIGH (ref 70–99)
Potassium: 4.6 mmol/L (ref 3.5–5.1)
Potassium: 4.1 mmol/L (ref 3.5–5.1)
Sodium: 138 mmol/L (ref 135–145)
Sodium: 145 mmol/L (ref 135–145)

## 2022-12-04 LAB — GLUCOSE, CAPILLARY
Glucose-Capillary: 358 mg/dL — ABNORMAL HIGH (ref 70–99)
Glucose-Capillary: 252 mg/dL — ABNORMAL HIGH (ref 70–99)
Glucose-Capillary: 185 mg/dL — ABNORMAL HIGH (ref 70–99)
Glucose-Capillary: 151 mg/dL — ABNORMAL HIGH (ref 70–99)

## 2022-12-04 LAB — ECHOCARDIOGRAM COMPLETE
Area-P 1/2: 4.07 cm2
Calc EF: 43.1 %
Height: 75 in
S' Lateral: 3.17 cm
Single Plane A2C EF: 38 %
Single Plane A4C EF: 47.4 %
Weight: 3296 oz

## 2022-12-04 LAB — HEPARIN LEVEL (UNFRACTIONATED): Heparin Unfractionated: 0.44 IU/mL (ref 0.30–0.70)

## 2022-12-04 LAB — TROPONIN I (HIGH SENSITIVITY): Troponin I (High Sensitivity): 2475 ng/L (ref <18)

## 2022-12-04 MED ORDER — INSULIN GLARGINE-YFGN 100 UNIT/ML ~~LOC~~ SOLN
30.0000 [IU] | Freq: Every day | SUBCUTANEOUS | Status: DC
  Administered 2022-12-05 – 2022-12-10 (×6): 30 [IU] via SUBCUTANEOUS
  Filled 2022-12-04 (×6): qty 0.3

## 2022-12-04 MED ORDER — INSULIN GLARGINE-YFGN 100 UNIT/ML ~~LOC~~ SOLN
10.0000 [IU] | Freq: Every day | SUBCUTANEOUS | Status: DC
  Administered 2022-12-04 – 2022-12-09 (×6): 10 [IU] via SUBCUTANEOUS
  Filled 2022-12-04 (×8): qty 0.1

## 2022-12-04 NOTE — Plan of Care (Signed)
  Problem: Education: Goal: Knowledge of disease or condition will improve Outcome: Progressing Goal: Knowledge of secondary prevention will improve (MUST DOCUMENT ALL) Outcome: Progressing Goal: Knowledge of patient specific risk factors will improve Elta [place] N/A or DELETE if not current risk factor) Outcome: Progressing   Problem: Ischemic Stroke/TIA Tissue Perfusion: Goal: Complications of ischemic stroke/TIA will be minimized Outcome: Progressing   Problem: Coping: Goal: Will verbalize positive feelings about self Outcome: Progressing Goal: Will identify appropriate support needs Outcome: Progressing   Problem: Health Behavior/Discharge Planning: Goal: Ability to manage health-related needs will improve Outcome: Progressing Goal: Goals will be collaboratively established with patient/family Outcome: Progressing   Problem: Self-Care: Goal: Ability to participate in self-care as condition permits will improve Outcome: Progressing Goal: Verbalization of feelings and concerns over difficulty with self-care will improve Outcome: Progressing Goal: Ability to communicate needs accurately will improve Outcome: Progressing   Problem: Nutrition: Goal: Risk of aspiration will decrease Outcome: Progressing Goal: Dietary intake will improve Outcome: Progressing   Problem: Safety: Goal: Non-violent Restraint(s) Outcome: Progressing   Problem: Education: Goal: Knowledge of General Education information will improve Description: Including pain rating scale, medication(s)/side effects and non-pharmacologic comfort measures Outcome: Progressing   Problem: Health Behavior/Discharge Planning: Goal: Ability to manage health-related needs will improve Outcome: Progressing   Problem: Clinical Measurements: Goal: Ability to maintain clinical measurements within normal limits will improve Outcome: Progressing Goal: Will remain free from infection Outcome: Progressing Goal: Diagnostic  test results will improve Outcome: Progressing Goal: Respiratory complications will improve Outcome: Progressing Goal: Cardiovascular complication will be avoided Outcome: Progressing   Problem: Activity: Goal: Risk for activity intolerance will decrease Outcome: Progressing   Problem: Nutrition: Goal: Adequate nutrition will be maintained Outcome: Progressing   Problem: Coping: Goal: Level of anxiety will decrease Outcome: Progressing   Problem: Elimination: Goal: Will not experience complications related to bowel motility Outcome: Progressing Goal: Will not experience complications related to urinary retention Outcome: Progressing   Problem: Pain Managment: Goal: General experience of comfort will improve Outcome: Progressing   Problem: Safety: Goal: Ability to remain free from injury will improve Outcome: Progressing   Problem: Skin Integrity: Goal: Risk for impaired skin integrity will decrease Outcome: Progressing   Problem: Education: Goal: Ability to describe self-care measures that may prevent or decrease complications (Diabetes Survival Skills Education) will improve Outcome: Progressing Goal: Individualized Educational Video(s) Outcome: Progressing   Problem: Coping: Goal: Ability to adjust to condition or change in health will improve Outcome: Progressing   Problem: Fluid Volume: Goal: Ability to maintain a balanced intake and output will improve Outcome: Progressing   Problem: Health Behavior/Discharge Planning: Goal: Ability to identify and utilize available resources and services will improve Outcome: Progressing Goal: Ability to manage health-related needs will improve Outcome: Progressing   Problem: Metabolic: Goal: Ability to maintain appropriate glucose levels will improve Outcome: Progressing   Problem: Nutritional: Goal: Maintenance of adequate nutrition will improve Outcome: Progressing Goal: Progress toward achieving an optimal  weight will improve Outcome: Progressing   Problem: Skin Integrity: Goal: Risk for impaired skin integrity will decrease Outcome: Progressing   Problem: Tissue Perfusion: Goal: Adequacy of tissue perfusion will improve Outcome: Progressing

## 2022-12-04 NOTE — Inpatient Diabetes Management (Addendum)
Inpatient Diabetes Program Recommendations  AACE/ADA: New Consensus Statement on Inpatient Glycemic Control (2015)  Target Ranges:  Prepandial:   less than 140 mg/dL      Peak postprandial:   less than 180 mg/dL (1-2 hours)      Critically ill patients:  140 - 180 mg/dL   Lab Results  Component Value Date   GLUCAP 252 (H) 12/04/2022   HGBA1C 10.1 (H) 08/12/2022    Latest Reference Range & Units 11/20/22 08:19  C-Peptide 1.1 - 4.4 ng/mL <0.1 (L)   Diabetes history: DM1 Outpatient Diabetes medications: Lantus 38 units am, 20 units pm, Novolog 10 units tid meal coverage Current orders for Inpatient glycemic control: Semglee 20 units qd, Novolog 0-20 units tid, 0-5 units hs  Inpatient Diabetes Program Recommendations:   Patient has type 1 diabetes. Please consider: -Increase Semglee am to 30 units -Add Semglee 10 units pm -Add Novolog 4 units tid meal coverage if eats 50% meals -Decrease Novolog correction to 0-9 units tid, hs 0-5 units  1:40 pm Spoke with patient's wife and sister. Patient is asleep and had echo completed.  Wife states she knows patient did not take any insulin Monday and unsure if he took the other days since discharge home or not. Reviewed with wife that patient will need basal insulin even when patient is not eating since patient has type 1 diabetes. Wife was told her husband has type 1 but did not know he was to receive basal insulin if patient was not eating. Reviewed with wife steps of insulin pen and she did return demonstration successfully. Sent insulin pen video done by [facility] clinic to wife's cell phone per request.  Thank you, Nani Gasser. Xoey Warmoth, RN, MSN, CDE  Diabetes Coordinator Inpatient Glycemic Control Team Team Pager [phone] (8am-5pm) 12/04/2022 1:05 PM

## 2022-12-04 NOTE — Progress Notes (Signed)
ANTICOAGULATION CONSULT NOTE  Pharmacy Consult for Heparin  Indication: chest pain/ACS, elevated troponin   No Known Allergies  Patient Measurements: Height: '6\' 3"'$  (190.5 cm) Weight: 93.4 kg (206 lb) IBW/kg (Calculated) : 84.5  Vital Signs: Temp: 98.1 F (36.7 C) (12/20 0336) Temp Source: Axillary (12/20 0336) BP: 121/79 (12/20 0336) Pulse Rate: 76 (12/20 0336)  Labs: Recent Labs    12/03/22 0400 12/03/22 0406 12/03/22 0605 12/03/22 1230 12/03/22 1459 12/03/22 1634 12/04/22 0157  HGB 12.7* 13.9  --   --   --   --   --   HCT 38.7* 41.0  --   --   --   --   --   PLT 310  --   --   --   --   --   --   HEPARINUNFRC  --   --   --  0.64  --   --  0.44  CREATININE 3.27*  --    < > 2.97* 2.71*  --  2.07*  CKTOTAL  --   --   --   --  520*  --   --   TROPONINIHS 491*  --    < > 2,884* 4,389* 4,344*  --    < > = values in this interval not displayed.     Estimated Creatinine Clearance: 36.9 mL/min (A) (by C-G formula based on SCr of 2.07 mg/dL (H)).   Medical History: Past Medical History:  Diagnosis Date   Diabetes mellitus type II    GERD (gastroesophageal reflux disease)    Glaucoma    Hyperlipidemia    Hypertension     Assessment: 76 y/o M with hyperglycemia and elevated troponin, starting heparin for now for ACS. Patient is not on anticoagulation PTA. Noted renal dysfunction.  12/20 AM update:  Heparin level therapeutic x 2   Goal of Therapy:  Heparin level 0.3-0.7 units/ml Monitor platelets by anticoagulation protocol: Yes   Plan:  Cont heparin 1150 units/hr Daily CBC and heparin level   Narda Bonds, PharmD, BCPS Clinical Pharmacist Phone: [phone]

## 2022-12-04 NOTE — Progress Notes (Signed)
Consultation Progress Note   Patient: Christopher [name] TIW:[id number] DOB: 05/09/1946 DOA: 12/03/2022 DOS: the patient was seen and examined on 12/04/2022 Primary service: Thuan Tippett, Manfred Shirts, MD  Brief hospital course: MACARTHUR LORUSSO is a 76 y.o. male with medical history significant of IDDM with insulin resistance, HTN, HLD, glaucoma, GERD, CKD stage II, brought in by family member for confusion and hyperglycemia.   Symptoms started Saturday/Sunday, patient started to develop cramping-like epigastric pain along with very nauseous and frequent vomiting of stomach content nonbilious nonbloody, and loose bowel movement but denies any nystagmus.  He also runs episodes of subjective fever and chills and symptoms of Monday.  During which time, patient did not take any of his insulins for last 3 days.  Feels nauseous vomiting and abdominal pain symptoms significantly resolved this morning and started to feel hungry but he continued to have diarrhea 2 times this morning which is watery.  Family checked his glucose and midnight which was > 600 and patient was seen breathing fast, decided to bring him into the ED.  He denied any chest pain, no shortness of breath.   ED Course: Tachycardia, borderline hypotensive not hypoxic.  Lab work showed fingerstick more than thousand, bicarb 9, creatinine 0.0 compared to baseline 1.4 last week, glucose 978, beta hydroxy elevated.  Patient received a total of 4.8 L IV bolus and insulin drip started in the ED this morning.   Initial EKG showed ST depression 2, 3 and aVF with heart rate in the 130s, troponins 491> 581 cardiology consulted, heparin drip started in the ED.  Assessment and Plan: Principal Problem:   DKA (diabetic ketoacidosis) ([place]) Active Problems:   Gastroenteritis   Elevated troponin   AKI (acute kidney injury) ([place])  (please populate well all problems here in Problem List. (For example, if patient is on BP meds at home and you resume or decide to hold  them, it is a problem that needs to be her. Same for CAD, COPD, HLD and so on)     DKA-Resolved Presented with DKA, was on insulin drip, currently discontinued   Delirium-Improved this morning, likely due to acute medical issues at presentation- DKA with severe acidosis -Nurse reported patient started to pull IV and other equipment, will continue  on soft restraint and as needed Zyprexa   NSTEMI -No chest pain but significant elevation of troponins 490> 581> 1000 with persistent ST changes depressions and T wave inversions on 2 3 aVF in the morning and additional ST depression and T inversion on V5 and V6 this afternoon.  Cardiology following, Echo ordered and pending -Continue heparin drip. -ASA, statin -Undecided on Cath timing due to kidney function -Resume lisinopril when kidney function stabilized.   AKI on CKD stage II -Likely prerenal from volume contraction and dehydration from repeated nauseous vomiting and diarrhea from putative gastroenteritis -Clinically appears to be dry, will give 1 more liter of bolus, and continue high rate of IV fluids.    SIRS -Secondary to DKA and possibly also reactive to gastroenteritis -Chest x-ray no significant infiltrates, UA showed no pyuria and diarrhea likely part of the GI symptoms from gastroenteritis, hold off antibiotics.    Probably acute gastroenteritis -Symptoms started Saturday-Sunday, most of the upper GI symptoms including nauseous vomiting have improved but still having diarrhea.  On physical exam, abdomen exam benign, no indication for further GI workup       DVT prophylaxis: Heparin drip Code Status: Full code       TRH  will continue to follow the patient.  Subjective: No complaints this morning, remains on soft restraints. Denies nausea, vomiting, abd pain, fevers chills, he is Ao x 3 He is unsure why he is in the hospital  Physical Exam: Vitals:   12/04/22 1000 12/04/22 1100 12/04/22 1114 12/04/22 1146  BP:    137/87 (!) 143/92  Pulse: 81 73 78 73  Resp: '16 12 13 11  '$ Temp:   98.8 F (37.1 C)   TempSrc:   Oral   SpO2: 96% 98% 96% 99%  Weight:      Height:      Constitutional: NAD, calm, comfortable  Eyes: PERRL, lids and conjunctivae normal ENMT: Moist mucous membranes. Posterior pharynx clear of any exudate or lesions.Normal dentition.  Neck: normal, supple, no masses, no thyromegaly Respiratory: clear to auscultation bilaterally, no wheezing, no crackles. Normal respiratory effort. No accessory muscle use.  Cardiovascular: Regular rate and rhythm, no murmurs / rubs / gallops. No extremity edema. 2+ pedal pulses. No carotid bruits.  Abdomen: no tenderness, no masses palpated. No hepatosplenomegaly. Bowel sounds positive.  Musculoskeletal: no clubbing / cyanosis. No joint deformity upper and lower extremities. Good ROM, no contractures. Normal muscle tone.  Skin: no rashes, lesions, ulcers. No induration Neurologic: CN 2-12 grossly intact. Sensation intact, DTR normal. Strength 5/5 in all 4.  Psychiatric: Calm awake, oriented to person and place, and  time.   Data Reviewed:  There are no new results to review at this time.  Family Communication:   Time spent: 15 minutes.  Author: Cristela Felt, MD 12/04/2022 12:50 PM  For on call review [email or web address].

## 2022-12-04 NOTE — Progress Notes (Signed)
  Echocardiogram 2D Echocardiogram has been performed.  Bobbye Charleston 12/04/2022, 1:53 PM

## 2022-12-05 ENCOUNTER — Telehealth: Payer: Self-pay | Admitting: Internal Medicine

## 2022-12-05 DIAGNOSIS — E1110 Type 2 diabetes mellitus with ketoacidosis without coma: Secondary | ICD-10-CM

## 2022-12-05 DIAGNOSIS — R7989 Other specified abnormal findings of blood chemistry: Secondary | ICD-10-CM | POA: Diagnosis not present

## 2022-12-05 DIAGNOSIS — N179 Acute kidney failure, unspecified: Secondary | ICD-10-CM | POA: Diagnosis not present

## 2022-12-05 LAB — CBC
HCT: 32 % — ABNORMAL LOW (ref 39.0–52.0)
Hemoglobin: 11.3 g/dL — ABNORMAL LOW (ref 13.0–17.0)
MCH: 26 pg (ref 26.0–34.0)
MCHC: 35.3 g/dL (ref 30.0–36.0)
MCV: 73.6 fL — ABNORMAL LOW (ref 80.0–100.0)
Platelets: 178 10*3/uL (ref 150–400)
RBC: 4.35 MIL/uL (ref 4.22–5.81)
RDW: 14.3 % (ref 11.5–15.5)
WBC: 12.2 10*3/uL — ABNORMAL HIGH (ref 4.0–10.5)
nRBC: 0 % (ref 0.0–0.2)

## 2022-12-05 LAB — GLUCOSE, CAPILLARY
Glucose-Capillary: 160 mg/dL — ABNORMAL HIGH (ref 70–99)
Glucose-Capillary: 250 mg/dL — ABNORMAL HIGH (ref 70–99)
Glucose-Capillary: 246 mg/dL — ABNORMAL HIGH (ref 70–99)
Glucose-Capillary: 268 mg/dL — ABNORMAL HIGH (ref 70–99)

## 2022-12-05 LAB — HEPARIN LEVEL (UNFRACTIONATED): Heparin Unfractionated: 0.44 IU/mL (ref 0.30–0.70)

## 2022-12-05 NOTE — Progress Notes (Signed)
Rounding Note    Patient Name: Christopher [name] Date of Encounter: 12/05/2022  [place] Cardiologist: None   Subjective   No chest pain  Inpatient Medications    Scheduled Meds:  aspirin  81 mg Oral Daily   atorvastatin  80 mg Oral Daily   brimonidine  1 drop Both Eyes BID   dorzolamide-timolol  1 drop Both Eyes BID   insulin aspart  0-20 Units Subcutaneous TID WC   insulin aspart  0-5 Units Subcutaneous QHS   insulin glargine-yfgn  10 Units Subcutaneous QHS   insulin glargine-yfgn  30 Units Subcutaneous Daily   Continuous Infusions:  heparin 1,150 Units/hr (12/05/22 0823)   PRN Meds: dextrose, OLANZapine, ondansetron (ZOFRAN) IV   Vital Signs    Vitals:   12/04/22 1942 12/04/22 2318 12/05/22 0240 12/05/22 0744  BP: (!) 143/96 (!) 136/94 (!) 125/92 130/87  Pulse: 72 70 69 62  Resp: '20 15 18 15  '$ Temp: 98.1 F (36.7 C) 98.2 F (36.8 C) 98.5 F (36.9 C) 98.6 F (37 C)  TempSrc: Oral Oral Oral Oral  SpO2: 98% 95% 97% 96%  Weight:      Height:        Intake/Output Summary (Last 24 hours) at 12/05/2022 1017 Last data filed at 12/05/2022 0912 Gross per 24 hour  Intake 1698.65 ml  Output 700 ml  Net 998.65 ml      12/03/2022    3:47 AM 11/29/2022    9:00 AM 11/18/2022    6:23 PM  Last 3 Weights  Weight (lbs) 206 lb 206 lb 12.7 oz 208 lb  Weight (kg) 93.441 kg 93.8 kg 94.348 kg      Telemetry    Normal sinus rhythm- Personally Reviewed  ECG    None recent  Physical Exam   GEN: No acute distress.   Neck: No JVD Cardiac: RRR, no murmurs, rubs, or gallops.  Respiratory: Clear to auscultation bilaterally. GI: Soft, nontender, non-distended  MS: No edema; No deformity. Neuro:  Nonfocal  Psych: Normal affect   Labs    High Sensitivity Troponin:   Recent Labs  Lab 12/03/22 0758 12/03/22 1230 12/03/22 1459 12/03/22 1634 12/04/22 0606  TROPONINIHS 1,021* 2,884* 4,389* 4,344* 2,475*     Chemistry Recent Labs  Lab  11/29/22 0956 12/03/22 0400 12/03/22 0758 12/03/22 1230 12/03/22 1459 12/04/22 0157  NA 136   < > 138 140 144 145  K 4.4   < > 4.6 4.0 3.6 4.1  CL 105   < > 100 108 111 116*  CO2 22   < > 9* 16* 24 21*  GLUCOSE 267*   < > 978* 534* 334* 246*  BUN 18   < > 61* 56* 53* 43*  CREATININE 1.36*   < > 3.03* 2.97* 2.71* 2.07*  CALCIUM 8.5*   < > 8.0* 8.2* 8.5* 8.1*  MG  --   --  2.2  --   --   --   PROT 5.9*  --   --   --   --   --   ALBUMIN 3.4*  --   --   --   --   --   AST 20  --   --   --   --   --   ALT 16  --   --   --   --   --   ALKPHOS 72  --   --   --   --   --  BILITOT 0.9  --   --   --   --   --   GFRNONAA 54*   < > 21* 21* 24* 33*  ANIONGAP 9   < > 29* 16* 9 8   < > = values in this interval not displayed.    Lipids No results for input(s): "CHOL", "TRIG", "HDL", "LABVLDL", "LDLCALC", "CHOLHDL" in the last 168 hours.  Hematology Recent Labs  Lab 11/29/22 0956 12/03/22 0400 12/03/22 0406 12/05/22 0506  WBC 5.8 19.4*  --  12.2*  RBC 4.88 4.92  --  4.35  HGB 12.6* 12.7* 13.9 11.3*  HCT 36.4* 38.7* 41.0 32.0*  MCV 74.6* 78.7*  --  73.6*  MCH 25.8* 25.8*  --  26.0  MCHC 34.6 32.8  --  35.3  RDW 14.3 15.3  --  14.3  PLT 214 310  --  178   Thyroid No results for input(s): "TSH", "FREET4" in the last 168 hours.  BNPNo results for input(s): "BNP", "PROBNP" in the last 168 hours.  DDimer No results for input(s): "DDIMER" in the last 168 hours.   Radiology    ECHOCARDIOGRAM COMPLETE  Result Date: 12/04/2022    ECHOCARDIOGRAM REPORT   Patient Name:   Christopher [name] Date of Exam: 12/04/2022 Medical Rec #:  [id number]      Height:       75.0 in Accession #:    [id number]     Weight:       206.0 lb Date of Birth:  30-Jul-1946      BSA:          2.222 m Patient Age:    76 years       BP:           127/88 mmHg Patient Gender: M              HR:           81 bpm. Exam Location:  Inpatient Procedure: 2D Echo, 3D Echo, Cardiac Doppler, Color Doppler and Strain Analysis  Indications:    R07.9* Chest pain, unspecified. Elevated troponin  History:        Patient has no prior history of Echocardiogram examinations.                 TIA, Signs/Symptoms:Chest Pain; Risk Factors:Diabetes and                 Dyslipidemia.  Sonographer:    Roseanna Rainbow RDCS Referring Phys: [id number] [place]  1. Left ventricular ejection fraction, by estimation, is 50 to 55%. The left ventricle has low normal function. The left ventricle has no regional wall motion abnormalities. There is moderate left ventricular hypertrophy. Left ventricular diastolic parameters are consistent with Grade I diastolic dysfunction (impaired relaxation).  2. Right ventricular systolic function is mildly reduced. The right ventricular size is normal. There is normal pulmonary artery systolic pressure. The estimated right ventricular systolic pressure is 78.9 mmHg.  3. The mitral valve is abnormal. Mild mitral valve regurgitation.  4. The aortic valve is tricuspid. Aortic valve regurgitation is not visualized. Aortic valve sclerosis is present, with no evidence of aortic valve stenosis.  5. The inferior vena cava is dilated in size with >50% respiratory variability, suggesting right atrial pressure of 8 mmHg. Comparison(s): No prior Echocardiogram. FINDINGS  Left Ventricle: Left ventricular ejection fraction, by estimation, is 50 to 55%. The left ventricle has low normal function. The left ventricle has no regional wall motion  abnormalities. The left ventricular internal cavity size was normal in size. There is moderate left ventricular hypertrophy. Left ventricular diastolic parameters are consistent with Grade I diastolic dysfunction (impaired relaxation). Indeterminate filling pressures. Right Ventricle: The right ventricular size is normal. No increase in right ventricular wall thickness. Right ventricular systolic function is mildly reduced. There is normal pulmonary artery systolic pressure. The tricuspid  regurgitant velocity is 1.83 m/s, and with an assumed right atrial pressure of 8 mmHg, the estimated right ventricular systolic pressure is 82.5 mmHg. Left Atrium: Left atrial size was normal in size. Right Atrium: Right atrial size was normal in size. Pericardium: There is no evidence of pericardial effusion. Mitral Valve: The mitral valve is abnormal. There is mild thickening of the anterior and posterior mitral valve leaflet(s). Mild mitral valve regurgitation. Tricuspid Valve: The tricuspid valve is grossly normal. Tricuspid valve regurgitation is trivial. Aortic Valve: The aortic valve is tricuspid. Aortic valve regurgitation is not visualized. Aortic valve sclerosis is present, with no evidence of aortic valve stenosis. Pulmonic Valve: The pulmonic valve was grossly normal. Pulmonic valve regurgitation is trivial. Aorta: The aortic root and ascending aorta are structurally normal, with no evidence of dilitation. Venous: The inferior vena cava is dilated in size with greater than 50% respiratory variability, suggesting right atrial pressure of 8 mmHg. IAS/Shunts: No atrial level shunt detected by color flow Doppler.  LEFT VENTRICLE PLAX 2D LVIDd:         4.07 cm     Diastology LVIDs:         3.17 cm     LV e' medial:    4.46 cm/s LV PW:         1.40 cm     LV E/e' medial:  9.4 LV IVS:        1.53 cm     LV e' lateral:   9.14 cm/s LVOT diam:     2.50 cm     LV E/e' lateral: 4.6 LV SV:         74 LV SV Index:   33 LVOT Area:     4.91 cm                             3D Volume EF: LV Volumes (MOD)           3D EF:        48 % LV vol d, MOD A2C: 75.8 ml LV EDV:       117 ml LV vol d, MOD A4C: 81.7 ml LV ESV:       61 ml LV vol s, MOD A2C: 47.0 ml LV SV:        56 ml LV vol s, MOD A4C: 43.0 ml LV SV MOD A2C:     28.8 ml LV SV MOD A4C:     81.7 ml LV SV MOD BP:      35.0 ml RIGHT VENTRICLE            IVC RV S prime:     9.73 cm/s  IVC diam: 2.80 cm TAPSE (M-mode): 1.7 cm LEFT ATRIUM           Index        RIGHT ATRIUM            Index LA diam:      3.85 cm 1.73 cm/m   RA Area:     17.30 cm LA Vol (A2C): 58.2  ml 26.19 ml/m  RA Volume:   38.50 ml  17.33 ml/m LA Vol (A4C): 49.4 ml 22.23 ml/m  AORTIC VALVE LVOT Vmax:   86.40 cm/s LVOT Vmean:  54.800 cm/s LVOT VTI:    0.150 m  AORTA Ao Root diam: 4.05 cm Ao Asc diam:  3.70 cm MITRAL VALVE               TRICUSPID VALVE MV Area (PHT): 4.07 cm    TR Peak grad:   13.4 mmHg MV Decel Time: 187 msec    TR Vmax:        183.00 cm/s MV E velocity: 42.00 cm/s MV A velocity: 54.60 cm/s  SHUNTS MV E/A ratio:  0.77        Systemic VTI:  0.15 m                            Systemic Diam: 2.50 cm Lyman Bishop MD Electronically signed by Lyman Bishop MD Signature Date/Time: 12/04/2022/2:18:20 PM    Final    US RENAL  Result Date: 12/03/2022 CLINICAL DATA:  Acute kidney injury. EXAM: RENAL / URINARY TRACT ULTRASOUND COMPLETE COMPARISON:  None Available. FINDINGS: Right Kidney: Renal measurements: 10.6 cm x 4.0 cm x 4.0 cm = volume: 88.16 mL. Echogenicity within normal limits. A 2.1 cm x 1.6 cm x 1.5 cm simple cyst is seen within the upper pole of the right kidney. No hydronephrosis is visualized. Left Kidney: Renal measurements: 12.1 cm x 5.5 cm x 5.1 cm = volume: 178.83 mL. Echogenicity within normal limits. An 8.3 cm x 6.3 cm x 5.8 cm simple cyst is seen within the upper pole of the left kidney. No hydronephrosis is visualized. Bladder: Appears normal for degree of bladder distention. Other: None. IMPRESSION: Bilateral simple renal cysts. Electronically Signed   By: Virgina Norfolk M.D.   On: 12/03/2022 22:42   DG Chest 1 View  Result Date: 12/03/2022 CLINICAL DATA:  Non ST elevated myocardial infarction. EXAM: CHEST  1 VIEW COMPARISON:  Same day. FINDINGS: The heart size and mediastinal contours are within normal limits. Both lungs are clear. The visualized skeletal structures are unremarkable. IMPRESSION: No active disease. Electronically Signed   By: Marijo Conception M.D.   On:  12/03/2022 14:45    Cardiac Studies   Echo shows EF 50 to 55%  Patient Profile     76 y.o. male elevated troponin in the setting of DKA  Assessment & Plan    Elevated troponin: I suspect this is demand ischemia due to his significant metabolic disturbance and DKA.  Troponins are trending down.  LVEF normal.  I doubt plaque rupture MI.  Will repeat ECG.  Would not exposed to diet at this point given his acute renal failure.  No creatinine available as of yet from today.  Hopefully, this continues to improve.  No symptoms of ischemia.  Could consider pharmacologic stress test at a later time.  If he had recurrent symptoms, could consider cath as long as his renal function was normal.  Would also consider stopping heparin from cardiac standpoint.     For questions or updates, please contact [place] Please consult [email or web address] for contact info under        Signed, Larae Grooms, MD  12/05/2022, 10:17 AM

## 2022-12-05 NOTE — Progress Notes (Signed)
Consultation Progress Note   Patient: Christopher [name] [id number]:[id number] DOB: June 26, 1946 DOA: 12/03/2022 DOS: the patient was seen and examined on 12/05/2022 Primary service: Bonnell Public, MD  Brief hospital course: Christopher [name] is a 76 y.o. male with medical history significant of IDDM with insulin resistance, HTN, HLD, glaucoma, GERD, CKD stage II, brought in by family member for confusion and hyperglycemia.   Symptoms started Saturday/Sunday, patient started to develop cramping-like epigastric pain along with very nauseous and frequent vomiting of stomach content nonbilious nonbloody, and loose bowel movement but denies any nystagmus.  He also runs episodes of subjective fever and chills and symptoms of Monday.  During which time, patient did not take any of his insulins for last 3 days.  Feels nauseous vomiting and abdominal pain symptoms significantly resolved this morning and started to feel hungry but he continued to have diarrhea 2 times this morning which is watery.  Family checked his glucose and midnight which was > 600 and patient was seen breathing fast, decided to bring him into the ED.  He denied any chest pain, no shortness of breath.   ED Course: Tachycardia, borderline hypotensive not hypoxic.  Lab work showed fingerstick more than thousand, bicarb 9, creatinine 0.0 compared to baseline 1.4 last week, glucose 978, beta hydroxy elevated.  Patient received a total of 4.8 L IV bolus and insulin drip started in the ED this morning.   Initial EKG showed ST depression 2, 3 and aVF with heart rate in the 130s, troponins 491> 581 cardiology consulted, heparin drip started in the ED.  12/05/2022: Patient seen alongside patient's wife and daughter.  Also discussed extensively with patient's nurse.  Patient is slowly improving.  Altered mentation is improving.  Cardiology input is appreciated.  Cardiology team is managing elevated troponin.  Blood sugar control is improving.  Patient will  need diabetic educator's input.  Possible discharge tomorrow.  Patient will need to follow-up closely with the primary care provider and endocrinologist on discharge.  No chest pain or shortness of breath endorsed.  No fever or chills.  Renal panel in the morning, magnesium, CBC and A1c.  Assessment and Plan: Principal Problem:   DKA (diabetic ketoacidosis) ([place]) Active Problems:   Gastroenteritis   Elevated troponin   AKI (acute kidney injury) ([place])  (please populate well all problems here in Problem List. (For example, if patient is on BP meds at home and you resume or decide to hold them, it is a problem that needs to be her. Same for CAD, COPD, HLD and so on)     DKA: -Resolved Presented with DKA, was on insulin drip, currently discontinued 12/05/2022: Patient will need diabetic education.  Patient's family will also need diabetic education.  Blood sugar control is improving.  Patient will need to follow-up closely with primary care provider and endocrinologist on discharge.  Guarded long-term prognosis.   Delirium/encephalopathy: -Improved significantly.     NSTEMI -No chest pain but significant elevation of troponins 490> 581> 1000 with persistent ST changes depressions and T wave inversions on 2 3 aVF in the morning and additional ST depression and T inversion on V5 and V6 this afternoon.  Cardiology following, Echo ordered and pending -Continue heparin drip. -ASA, statin -Undecided on Cath timing due to kidney function -Resume lisinopril when kidney function stabilized. 12/05/2022: Cardiology team is managing.   AKI on CKD stage IIIa: -Likely prerenal from volume contraction and dehydration from repeated nauseous vomiting and diarrhea from putative gastroenteritis -Clinically appears  to be dry, will give 1 more liter of bolus, and continue high rate of IV fluids. 12/05/2022: Suspect AKI secondary to combination of prerenal/ATN, in the setting of severe DKA.  SIRS -Secondary  to DKA and possibly also reactive to gastroenteritis -Chest x-ray no significant infiltrates, UA showed no pyuria and diarrhea likely part of the GI symptoms from gastroenteritis, hold off antibiotics. 12/05/2022: Sepsis physiology has resolved.    Probably acute gastroenteritis -Symptoms started Saturday-Sunday, most of the upper GI symptoms including nauseous vomiting have improved but still having diarrhea.  On physical exam, abdomen exam benign, no indication for further GI workup 12/05/2022: Resolved.     DVT prophylaxis: Heparin drip Code Status: Full code   Subjective:  -Seen alongside patient's daughter and wife. -Altered mentation has improved significantly. -No chest pain. -No shortness of breath. -No fever or chills.    Physical Exam: Vitals:   12/05/22 0240 12/05/22 0744 12/05/22 1125 12/05/22 1514  BP: (!) 125/92 130/87 (!) 141/92 (!) 138/96  Pulse: 69 62 72 73  Resp: '18 15 20 16  '$ Temp: 98.5 F (36.9 C) 98.6 F (37 C) 98.9 F (37.2 C) 98.5 F (36.9 C)  TempSrc: Oral Oral Oral Oral  SpO2: 97% 96% 98% 100%  Weight:      Height:       General condition: Not in any distress.  Awake and alert. HEENT: Patient is pale. Neck: Supple. Lungs: Clear to auscultation. CVS: S1-S2. Abdomen: Soft and nontender. Neuro: Awake and alert.  Moves all extremities. Extremities: No leg edema.   Data Reviewed:   Family Communication:  -Wife and daughter.  Time spent: 55 minutes.  Author: Bonnell Public, MD 12/05/2022 6:34 PM  For on call review [email or web address].

## 2022-12-05 NOTE — Telephone Encounter (Signed)
Please advise 

## 2022-12-05 NOTE — Telephone Encounter (Signed)
Please disregard previous message Dr. Jerilee Hoh. This message is for [facility].

## 2022-12-05 NOTE — Progress Notes (Addendum)
ANTICOAGULATION CONSULT NOTE  Pharmacy Consult for Heparin  Indication: chest pain/ACS, elevated troponin   No Known Allergies  Patient Measurements: Height: '6\' 3"'$  (190.5 cm) Weight: 93.4 kg (206 lb) IBW/kg (Calculated) : 84.5  Vital Signs: Temp: 98.9 F (37.2 C) (12/21 1125) Temp Source: Oral (12/21 1125) BP: 141/92 (12/21 1125) Pulse Rate: 72 (12/21 1125)  Labs: Recent Labs    12/03/22 0400 12/03/22 0406 12/03/22 0605 12/03/22 1230 12/03/22 1459 12/03/22 1634 12/04/22 0157 12/04/22 0606 12/05/22 0506  HGB 12.7* 13.9  --   --   --   --   --   --  11.3*  HCT 38.7* 41.0  --   --   --   --   --   --  32.0*  PLT 310  --   --   --   --   --   --   --  178  HEPARINUNFRC  --   --   --  0.64  --   --  0.44  --  0.44  CREATININE 3.27*  --    < > 2.97* 2.71*  --  2.07*  --   --   CKTOTAL  --   --   --   --  520*  --   --   --   --   TROPONINIHS 491*  --    < > 2,884* 4,389* 4,344*  --  2,475*  --    < > = values in this interval not displayed.     Estimated Creatinine Clearance: 36.9 mL/min (A) (by C-G formula based on SCr of 2.07 mg/dL (H)).   Medical History: Past Medical History:  Diagnosis Date   Diabetes mellitus type II    GERD (gastroesophageal reflux disease)    Glaucoma    Hyperlipidemia    Hypertension     Assessment: 76 y/o M with hyperglycemia and elevated troponin, starting heparin for now for ACS on 12/19. Patient is not on anticoagulation PTA. Noted renal dysfunction - improving  Heparin level remains therapeutic. Hg down to 11.3, plt down to 178. No bleed issues reported. Cardiology not currently planning cath unless has recurrent symptoms and renal function improved.  Goal of Therapy:  Heparin level 0.3-0.7 units/ml Monitor platelets by anticoagulation protocol: Yes   Plan:  Continue heparin infusion at 1150 units/hr Monitor daily heparin level and CBC, s/sx bleeding D/c heparin today? Per Cardiology note, ok to d/c from their  standpoint   Arturo Morton, PharmD, BCPS Please check AMION for all [place] contact numbers Clinical Pharmacist 12/05/2022 2:51 PM

## 2022-12-05 NOTE — Telephone Encounter (Signed)
Requesting TOC from [place] to [facility]

## 2022-12-06 DIAGNOSIS — E1159 Type 2 diabetes mellitus with other circulatory complications: Secondary | ICD-10-CM

## 2022-12-06 DIAGNOSIS — Z794 Long term (current) use of insulin: Secondary | ICD-10-CM

## 2022-12-06 DIAGNOSIS — N179 Acute kidney failure, unspecified: Secondary | ICD-10-CM | POA: Diagnosis not present

## 2022-12-06 DIAGNOSIS — E1010 Type 1 diabetes mellitus with ketoacidosis without coma: Secondary | ICD-10-CM | POA: Diagnosis not present

## 2022-12-06 DIAGNOSIS — R7989 Other specified abnormal findings of blood chemistry: Secondary | ICD-10-CM | POA: Diagnosis not present

## 2022-12-06 DIAGNOSIS — E782 Mixed hyperlipidemia: Secondary | ICD-10-CM | POA: Diagnosis not present

## 2022-12-06 LAB — CBC
HCT: 36.7 % — ABNORMAL LOW (ref 39.0–52.0)
Hemoglobin: 12.6 g/dL — ABNORMAL LOW (ref 13.0–17.0)
MCH: 25.5 pg — ABNORMAL LOW (ref 26.0–34.0)
MCHC: 34.3 g/dL (ref 30.0–36.0)
MCV: 74.1 fL — ABNORMAL LOW (ref 80.0–100.0)
Platelets: 165 10*3/uL (ref 150–400)
RBC: 4.95 MIL/uL (ref 4.22–5.81)
RDW: 14.3 % (ref 11.5–15.5)
WBC: 6.7 10*3/uL (ref 4.0–10.5)
nRBC: 0 % (ref 0.0–0.2)

## 2022-12-06 LAB — RENAL FUNCTION PANEL
Albumin: 3 g/dL — ABNORMAL LOW (ref 3.5–5.0)
Anion gap: 8 (ref 5–15)
BUN: 19 mg/dL (ref 8–23)
CO2: 24 mmol/L (ref 22–32)
Calcium: 9.1 mg/dL (ref 8.9–10.3)
Chloride: 106 mmol/L (ref 98–111)
Creatinine, Ser: 1.33 mg/dL — ABNORMAL HIGH (ref 0.61–1.24)
GFR, Estimated: 56 mL/min — ABNORMAL LOW (ref >60)
Glucose, Bld: 98 mg/dL (ref 70–99)
Phosphorus: 1.4 mg/dL — ABNORMAL LOW (ref 2.5–4.6)
Potassium: 3.3 mmol/L — ABNORMAL LOW (ref 3.5–5.1)
Sodium: 138 mmol/L (ref 135–145)

## 2022-12-06 LAB — GLUCOSE, CAPILLARY
Glucose-Capillary: 144 mg/dL — ABNORMAL HIGH (ref 70–99)
Glucose-Capillary: 189 mg/dL — ABNORMAL HIGH (ref 70–99)
Glucose-Capillary: 283 mg/dL — ABNORMAL HIGH (ref 70–99)
Glucose-Capillary: 229 mg/dL — ABNORMAL HIGH (ref 70–99)

## 2022-12-06 LAB — HEPARIN LEVEL (UNFRACTIONATED): Heparin Unfractionated: 0.13 IU/mL — ABNORMAL LOW (ref 0.30–0.70)

## 2022-12-06 LAB — MAGNESIUM: Magnesium: 1.6 mg/dL — ABNORMAL LOW (ref 1.7–2.4)

## 2022-12-06 LAB — TROPONIN I (HIGH SENSITIVITY): Troponin I (High Sensitivity): 737 ng/L (ref <18)

## 2022-12-06 MED ORDER — ACETAMINOPHEN 325 MG PO TABS
650.0000 mg | ORAL_TABLET | Freq: Four times a day (QID) | ORAL | Status: DC | PRN
  Administered 2022-12-06 – 2022-12-10 (×3): 650 mg via ORAL
  Filled 2022-12-06 (×3): qty 2

## 2022-12-06 MED ORDER — STERILE WATER FOR INJECTION IJ SOLN
INTRAMUSCULAR | Status: AC
  Administered 2022-12-06: 10 mL
  Filled 2022-12-06: qty 10

## 2022-12-06 MED ORDER — POTASSIUM CHLORIDE CRYS ER 20 MEQ PO TBCR
40.0000 meq | EXTENDED_RELEASE_TABLET | Freq: Once | ORAL | Status: AC
  Administered 2022-12-06: 40 meq via ORAL
  Filled 2022-12-06: qty 2

## 2022-12-06 NOTE — Progress Notes (Signed)
Consultation Progress Note   Patient: Christopher [name] [id number]:[id number] DOB: 1946-04-05 DOA: 12/03/2022 DOS: the patient was seen and examined on 12/06/2022 Primary service: Bonnell Public, MD  Brief hospital course: Christopher [name] is a 76 y.o. male with medical history significant of IDDM with insulin resistance, HTN, HLD, glaucoma, GERD, CKD stage II, brought in by family member for confusion and hyperglycemia.   Symptoms started Saturday/Sunday, patient started to develop cramping-like epigastric pain along with very nauseous and frequent vomiting of stomach content nonbilious nonbloody, and loose bowel movement but denies any nystagmus.  He also runs episodes of subjective fever and chills and symptoms of Monday.  During which time, patient did not take any of his insulins for last 3 days.  Feels nauseous vomiting and abdominal pain symptoms significantly resolved this morning and started to feel hungry but he continued to have diarrhea 2 times this morning which is watery.  Family checked his glucose and midnight which was > 600 and patient was seen breathing fast, decided to bring him into the ED.  He denied any chest pain, no shortness of breath.   ED Course: Tachycardia, borderline hypotensive not hypoxic.  Lab work showed fingerstick more than thousand, bicarb 9, creatinine 0.0 compared to baseline 1.4 last week, glucose 978, beta hydroxy elevated.  Patient received a total of 4.8 L IV bolus and insulin drip started in the ED this morning.   Initial EKG showed ST depression 2, 3 and aVF with heart rate in the 130s, troponins 491> 581 cardiology consulted, heparin drip started in the ED.  12/05/2022: Patient seen alongside patient's wife and daughter.  Also discussed extensively with patient's nurse.  Patient is slowly improving.  Altered mentation is improving.  Cardiology input is appreciated.  Cardiology team is managing elevated troponin.  Blood sugar control is improving.  Patient will  need diabetic educator's input.  Possible discharge tomorrow.  Patient will need to follow-up closely with the primary care provider and endocrinologist on discharge.  No chest pain or shortness of breath endorsed.  No fever or chills.  Renal panel in the morning, magnesium, CBC and A1c. 12/06/2022: Patient seen alongside patient's daughter.  Episode of confusion reported earlier in the day.  Patient has continued to have intermittent confusion.  Awaiting diabetic educator input.  Blood sugar control is improving.  Likely discharge in the next 1 to 2 days if intermittent confusion improves.  Assessment and Plan: Principal Problem:   DKA (diabetic ketoacidosis) ([place]) Active Problems:   Gastroenteritis   Elevated troponin   AKI (acute kidney injury) ([place])  (please populate well all problems here in Problem List. (For example, if patient is on BP meds at home and you resume or decide to hold them, it is a problem that needs to be her. Same for CAD, COPD, HLD and so on)     DKA: -Resolved Presented with DKA, was on insulin drip, currently discontinued 12/05/2022: Patient will need diabetic education.  Patient's family will also need diabetic education.  Blood sugar control is improving.  Patient will need to follow-up closely with primary care provider and endocrinologist on discharge.  Guarded long-term prognosis.   Delirium/encephalopathy: -Improved significantly, but continues intermittently.     NSTEMI -No chest pain but significant elevation of troponins 490> 581> 1000 with persistent ST changes depressions and T wave inversions on 2 3 aVF in the morning and additional ST depression and T inversion on V5 and V6 this afternoon.  Cardiology following, Echo ordered  and pending -Continue heparin drip. -ASA, statin -Undecided on Cath timing due to kidney function -Resume lisinopril when kidney function stabilized. 12/06/2022: Cardiology team is managing.  For outpatient pharmacologic cardiac  stress test.   AKI on CKD stage IIIa: -Likely prerenal from volume contraction and dehydration from repeated nauseous vomiting and diarrhea from putative gastroenteritis -Clinically appears to be dry, will give 1 more liter of bolus, and continue high rate of IV fluids. 12/05/2022: Suspect AKI secondary to combination of prerenal/ATN, in the setting of severe DKA. 12/06/2022: AKI has resolved significantly.  Patient may be close to his baseline.  SIRS -Secondary to DKA and possibly also reactive to gastroenteritis -Chest x-ray no significant infiltrates, UA showed no pyuria and diarrhea likely part of the GI symptoms from gastroenteritis, hold off antibiotics. 12/05/2022: Sepsis physiology has resolved.    Probably acute gastroenteritis -Symptoms started Saturday-Sunday, most of the upper GI symptoms including nauseous vomiting have improved but still having diarrhea.  On physical exam, abdomen exam benign, no indication for further GI workup 12/05/2022: Resolved.     DVT prophylaxis: Heparin drip Code Status: Full code   Subjective:  -Seen alongside patient's daughter. -No chest pain. -No shortness of breath. -No fever or chills.   -Confusion reported earlier in the day.  Physical Exam: Vitals:   12/06/22 1300 12/06/22 1400 12/06/22 1500 12/06/22 1514  BP:    (!) 143/100  Pulse: 92   84  Resp: (!) 23 (!) 25 (!) 25 (!) 25  Temp:    98.5 F (36.9 C)  TempSrc:    Oral  SpO2: 93%   97%  Weight:      Height:       General condition: Not in any distress.  Awake and alert. HEENT: Patient is pale. Neck: Supple. Lungs: Clear to auscultation. CVS: S1-S2. Abdomen: Soft and nontender. Neuro: Awake and alert.  Moves all extremities. Extremities: No leg edema.   Data Reviewed:   Family Communication:  -Wife and daughter.  Time spent: 35 minutes.  Author: Bonnell Public, MD 12/06/2022 5:54 PM  For on call review [email or web address].

## 2022-12-06 NOTE — Progress Notes (Signed)
  Transition of Care [facility]) Screening Note   Patient Details  Name: Christopher [name] Date of Birth: 10/20/1946   [facility] of [facility]) CM/SW Contact:    Dawayne Patricia, RN Phone Number: 12/06/2022, 3:01 PM    Transition of Care Department [facility]) has reviewed patient and no TOC needs have been identified at this time. We will continue to monitor patient advancement through interdisciplinary progression rounds. If new patient transition needs arise, please place a TOC consult.

## 2022-12-06 NOTE — Care Management Important Message (Signed)
Important Message  Patient Details  Name: Christopher [name] MRN: [id number] Date of Birth: 1946/04/08   Medicare Important Message Given:  Yes     Hannah Beat 12/06/2022, 12:45 PM

## 2022-12-06 NOTE — Progress Notes (Signed)
Rounding Note    Patient Name: Christopher [name] Date of Encounter: 12/06/2022  [place] Cardiologist: None   Subjective   No chest pain.  Had some confusion last night.  Apparently was combative.  Now has soft wrist restraints and mittens in place.  He does not recall the events last night and feels fine now.   Inpatient Medications    Scheduled Meds:  aspirin  81 mg Oral Daily   atorvastatin  80 mg Oral Daily   brimonidine  1 drop Both Eyes BID   dorzolamide-timolol  1 drop Both Eyes BID   insulin aspart  0-20 Units Subcutaneous TID WC   insulin aspart  0-5 Units Subcutaneous QHS   insulin glargine-yfgn  10 Units Subcutaneous QHS   insulin glargine-yfgn  30 Units Subcutaneous Daily   Continuous Infusions:  PRN Meds: dextrose, OLANZapine, ondansetron (ZOFRAN) IV   Vital Signs    Vitals:   12/06/22 0311 12/06/22 0713 12/06/22 0800 12/06/22 0900  BP: (!) 149/82 (!) 136/93    Pulse: 77 76 78 87  Resp: 20 19 (!) 24 (!) 24  Temp: 98.4 F (36.9 C) 99.4 F (37.4 C)    TempSrc: Oral Oral    SpO2: 96% 96% 98% 98%  Weight:      Height:        Intake/Output Summary (Last 24 hours) at 12/06/2022 1003 Last data filed at 12/06/2022 0900 Gross per 24 hour  Intake 1148.42 ml  Output 2700 ml  Net -1551.58 ml      12/03/2022    3:47 AM 11/29/2022    9:00 AM 11/18/2022    6:23 PM  Last 3 Weights  Weight (lbs) 206 lb 206 lb 12.7 oz 208 lb  Weight (kg) 93.441 kg 93.8 kg 94.348 kg      Telemetry    NSR - Personally Reviewed  ECG    NSR, ST changes have resolved - Personally Reviewed  Physical Exam   GEN: No acute distress.   Neck: No JVD Cardiac: RRR, no murmurs, rubs, or gallops.  Respiratory: Clear to auscultation bilaterally. GI: Soft, nontender, non-distended  MS: No edema; No deformity. Neuro:  Nonfocal  Psych: flat affect   Labs    High Sensitivity Troponin:   Recent Labs  Lab 12/03/22 1230 12/03/22 1459 12/03/22 1634  12/04/22 0606 12/06/22 0548  TROPONINIHS 2,884* 4,389* 4,344* 2,475* 737*     Chemistry Recent Labs  Lab 12/03/22 0758 12/03/22 1230 12/03/22 1459 12/04/22 0157 12/06/22 0548  NA 138   < > 144 145 138  K 4.6   < > 3.6 4.1 3.3*  CL 100   < > 111 116* 106  CO2 9*   < > 24 21* 24  GLUCOSE 978*   < > 334* 246* 98  BUN 61*   < > 53* 43* 19  CREATININE 3.03*   < > 2.71* 2.07* 1.33*  CALCIUM 8.0*   < > 8.5* 8.1* 9.1  MG 2.2  --   --   --  1.6*  ALBUMIN  --   --   --   --  3.0*  GFRNONAA 21*   < > 24* 33* 56*  ANIONGAP 29*   < > '9 8 8   '$ < > = values in this interval not displayed.    Lipids No results for input(s): "CHOL", "TRIG", "HDL", "LABVLDL", "LDLCALC", "CHOLHDL" in the last 168 hours.  Hematology Recent Labs  Lab 12/03/22 0400 12/03/22 0406 12/05/22  4970 12/06/22 0548  WBC 19.4*  --  12.2* 6.7  RBC 4.92  --  4.35 4.95  HGB 12.7* 13.9 11.3* 12.6*  HCT 38.7* 41.0 32.0* 36.7*  MCV 78.7*  --  73.6* 74.1*  MCH 25.8*  --  26.0 25.5*  MCHC 32.8  --  35.3 34.3  RDW 15.3  --  14.3 14.3  PLT 310  --  178 165   Thyroid No results for input(s): "TSH", "FREET4" in the last 168 hours.  BNPNo results for input(s): "BNP", "PROBNP" in the last 168 hours.  DDimer No results for input(s): "DDIMER" in the last 168 hours.   Radiology    ECHOCARDIOGRAM COMPLETE  Result Date: 12/04/2022    ECHOCARDIOGRAM REPORT   Patient Name:   Christopher [name] Date of Exam: 12/04/2022 Medical Rec #:  [id number]      Height:       75.0 in Accession #:    [id number]     Weight:       206.0 lb Date of Birth:  [birth year]      BSA:          2.222 m Patient Age:    76 years       BP:           127/88 mmHg Patient Gender: M              HR:           81 bpm. Exam Location:  Inpatient Procedure: 2D Echo, 3D Echo, Cardiac Doppler, Color Doppler and Strain Analysis Indications:    R07.9* Chest pain, unspecified. Elevated troponin  History:        Patient has no prior history of Echocardiogram examinations.                  TIA, Signs/Symptoms:Chest Pain; Risk Factors:Diabetes and                 Dyslipidemia.  Sonographer:    Roseanna Rainbow RDCS Referring Phys: [id number] [place]  1. Left ventricular ejection fraction, by estimation, is 50 to 55%. The left ventricle has low normal function. The left ventricle has no regional wall motion abnormalities. There is moderate left ventricular hypertrophy. Left ventricular diastolic parameters are consistent with Grade I diastolic dysfunction (impaired relaxation).  2. Right ventricular systolic function is mildly reduced. The right ventricular size is normal. There is normal pulmonary artery systolic pressure. The estimated right ventricular systolic pressure is 47.0 mmHg.  3. The mitral valve is abnormal. Mild mitral valve regurgitation.  4. The aortic valve is tricuspid. Aortic valve regurgitation is not visualized. Aortic valve sclerosis is present, with no evidence of aortic valve stenosis.  5. The inferior vena cava is dilated in size with >50% respiratory variability, suggesting right atrial pressure of 8 mmHg. Comparison(s): No prior Echocardiogram. FINDINGS  Left Ventricle: Left ventricular ejection fraction, by estimation, is 50 to 55%. The left ventricle has low normal function. The left ventricle has no regional wall motion abnormalities. The left ventricular internal cavity size was normal in size. There is moderate left ventricular hypertrophy. Left ventricular diastolic parameters are consistent with Grade I diastolic dysfunction (impaired relaxation). Indeterminate filling pressures. Right Ventricle: The right ventricular size is normal. No increase in right ventricular wall thickness. Right ventricular systolic function is mildly reduced. There is normal pulmonary artery systolic pressure. The tricuspid regurgitant velocity is 1.83 m/s, and with an assumed right atrial pressure of 8 mmHg, the  estimated right ventricular systolic pressure is 81.1 mmHg. Left  Atrium: Left atrial size was normal in size. Right Atrium: Right atrial size was normal in size. Pericardium: There is no evidence of pericardial effusion. Mitral Valve: The mitral valve is abnormal. There is mild thickening of the anterior and posterior mitral valve leaflet(s). Mild mitral valve regurgitation. Tricuspid Valve: The tricuspid valve is grossly normal. Tricuspid valve regurgitation is trivial. Aortic Valve: The aortic valve is tricuspid. Aortic valve regurgitation is not visualized. Aortic valve sclerosis is present, with no evidence of aortic valve stenosis. Pulmonic Valve: The pulmonic valve was grossly normal. Pulmonic valve regurgitation is trivial. Aorta: The aortic root and ascending aorta are structurally normal, with no evidence of dilitation. Venous: The inferior vena cava is dilated in size with greater than 50% respiratory variability, suggesting right atrial pressure of 8 mmHg. IAS/Shunts: No atrial level shunt detected by color flow Doppler.  LEFT VENTRICLE PLAX 2D LVIDd:         4.07 cm     Diastology LVIDs:         3.17 cm     LV e' medial:    4.46 cm/s LV PW:         1.40 cm     LV E/e' medial:  9.4 LV IVS:        1.53 cm     LV e' lateral:   9.14 cm/s LVOT diam:     2.50 cm     LV E/e' lateral: 4.6 LV SV:         74 LV SV Index:   33 LVOT Area:     4.91 cm                             3D Volume EF: LV Volumes (MOD)           3D EF:        48 % LV vol d, MOD A2C: 75.8 ml LV EDV:       117 ml LV vol d, MOD A4C: 81.7 ml LV ESV:       61 ml LV vol s, MOD A2C: 47.0 ml LV SV:        56 ml LV vol s, MOD A4C: 43.0 ml LV SV MOD A2C:     28.8 ml LV SV MOD A4C:     81.7 ml LV SV MOD BP:      35.0 ml RIGHT VENTRICLE            IVC RV S prime:     9.73 cm/s  IVC diam: 2.80 cm TAPSE (M-mode): 1.7 cm LEFT ATRIUM           Index        RIGHT ATRIUM           Index LA diam:      3.85 cm 1.73 cm/m   RA Area:     17.30 cm LA Vol (A2C): 58.2 ml 26.19 ml/m  RA Volume:   38.50 ml  17.33 ml/m LA Vol  (A4C): 49.4 ml 22.23 ml/m  AORTIC VALVE LVOT Vmax:   86.40 cm/s LVOT Vmean:  54.800 cm/s LVOT VTI:    0.150 m  AORTA Ao Root diam: 4.05 cm Ao Asc diam:  3.70 cm MITRAL VALVE               TRICUSPID VALVE MV Area (PHT): 4.07 cm  TR Peak grad:   13.4 mmHg MV Decel Time: 187 msec    TR Vmax:        183.00 cm/s MV E velocity: 42.00 cm/s MV A velocity: 54.60 cm/s  SHUNTS MV E/A ratio:  0.77        Systemic VTI:  0.15 m                            Systemic Diam: 2.50 cm Lyman Bishop MD Electronically signed by Lyman Bishop MD Signature Date/Time: 12/04/2022/2:18:20 PM    Final     Cardiac Studies   Echo showed EF 50-55%; LDL 88 in June 2023;   Patient Profile     76 y.o. male with demand ischemia, type II MI in the setting of DKA.   Assessment & Plan    Elevated troponin: No chest discomfort.  Demand ischemia.  Troponin has trended down.  ECG changes have resolved.  He did have some transient ST depressions in the inferior leads.  We discussed the options for further workup.  Options include pharmacologic stress test, CTA of the coronaries or cardiac catheterization.  Given his recent acute renal failure, we elected to avoid anything that required contrast dye.  If he had regional wall motion abnormalities or anginal symptoms, I would have plan for catheterization; however, the patient was active physically prior to his DKA.  He went to the gym several days a week.  Will plan for pharmacologic stress test.  He has still had some intermittent confusion.  Will plan to do this as an outpatient.  If he does have high risk findings on his perfusion scan, then he would need cardiac catheterization.  The family is in agreement with this plan.  I spoke to the daughter at length today.  Hyperlipidemia: Continue high-dose statin.  DM: A1c 10.1 in August 2023.  Continue to try to improve control.  Looking back, his A1c has been over 8 for or several years.        For questions or updates, please contact  [place] Please consult [email or web address] for contact info under        Signed, Larae Grooms, MD  12/06/2022, 10:03 AM

## 2022-12-06 NOTE — Inpatient Diabetes Management (Addendum)
Inpatient Diabetes Program Recommendations  AACE/ADA: New Consensus Statement on Inpatient Glycemic Control (2015)  Target Ranges:  Prepandial:   less than 140 mg/dL      Peak postprandial:   less than 180 mg/dL (1-2 hours)      Critically ill patients:  140 - 180 mg/dL   Lab Results  Component Value Date   GLUCAP 144 (H) 12/06/2022   HGBA1C 10.1 (H) 08/12/2022    Latest Reference Range & Units 11/20/22 08:19  C-Peptide 1.1 - 4.4 ng/mL <0.1 (L)    Latest Reference Range & Units 12/05/22 07:54 12/05/22 11:23 12/05/22 15:12 12/05/22 21:06 12/06/22 07:12  Glucose-Capillary 70 - 99 mg/dL 160 (H) 250 (H) 246 (H) 268 (H) 144 (H)   Diabetes history: DM1 Outpatient Diabetes medications: Lantus 38 units am, 20 units pm, Novolog 10 units tid meal coverage Current orders for Inpatient glycemic control:  Semglee 30 units qd Semglee 10 units qpm Novolog 0-20 units tid + hs  Inpatient Diabetes Program Recommendations:   Patient has type 1 diabetes and needs insulin to cover carbohydrate intake. Pt has meal coverage at home.  Please consider: -Add Novolog 4 units tid meal coverage if eats 50% meals -Decrease Novolog correction to 0-15 units tid, hs 0-5 units  Spoke with family at bedside per RN request. Family had questions about insulin pumps as pt was just prescribed one from his Endocrinologist, Dr. Dwyane Dee. I discussed how the insulin pump works and that there are many types. Explained how they would have extensive training with the pump. All questions answered.  Thanks,  Tama Headings RN, MSN, BC-ADM Inpatient Diabetes Coordinator Team Pager [phone] (8a-5p)

## 2022-12-07 ENCOUNTER — Inpatient Hospital Stay (HOSPITAL_COMMUNITY): Payer: Medicare HMO

## 2022-12-07 DIAGNOSIS — E1010 Type 1 diabetes mellitus with ketoacidosis without coma: Secondary | ICD-10-CM | POA: Diagnosis not present

## 2022-12-07 DIAGNOSIS — R7989 Other specified abnormal findings of blood chemistry: Secondary | ICD-10-CM | POA: Diagnosis not present

## 2022-12-07 LAB — RENAL FUNCTION PANEL
Albumin: 2.9 g/dL — ABNORMAL LOW (ref 3.5–5.0)
Anion gap: 7 (ref 5–15)
BUN: 19 mg/dL (ref 8–23)
CO2: 22 mmol/L (ref 22–32)
Calcium: 8.8 mg/dL — ABNORMAL LOW (ref 8.9–10.3)
Chloride: 108 mmol/L (ref 98–111)
Creatinine, Ser: 1.37 mg/dL — ABNORMAL HIGH (ref 0.61–1.24)
GFR, Estimated: 54 mL/min — ABNORMAL LOW (ref >60)
Glucose, Bld: 260 mg/dL — ABNORMAL HIGH (ref 70–99)
Phosphorus: 2.5 mg/dL (ref 2.5–4.6)
Potassium: 3.4 mmol/L — ABNORMAL LOW (ref 3.5–5.1)
Sodium: 137 mmol/L (ref 135–145)

## 2022-12-07 LAB — GLUCOSE, CAPILLARY
Glucose-Capillary: 274 mg/dL — ABNORMAL HIGH (ref 70–99)
Glucose-Capillary: 251 mg/dL — ABNORMAL HIGH (ref 70–99)
Glucose-Capillary: 268 mg/dL — ABNORMAL HIGH (ref 70–99)
Glucose-Capillary: 188 mg/dL — ABNORMAL HIGH (ref 70–99)
Glucose-Capillary: 356 mg/dL — ABNORMAL HIGH (ref 70–99)

## 2022-12-07 LAB — C-REACTIVE PROTEIN: CRP: 11.5 mg/dL — ABNORMAL HIGH (ref <1.0)

## 2022-12-07 LAB — HEMOGLOBIN A1C
Hgb A1c MFr Bld: 8.9 % — ABNORMAL HIGH (ref 4.8–5.6)
Mean Plasma Glucose: 209 mg/dL

## 2022-12-07 LAB — SEDIMENTATION RATE: Sed Rate: 27 mm/hr — ABNORMAL HIGH (ref 0–16)

## 2022-12-07 LAB — URIC ACID: Uric Acid, Serum: 4.3 mg/dL (ref 3.7–8.6)

## 2022-12-07 LAB — PROCALCITONIN: Procalcitonin: 0.33 ng/mL

## 2022-12-07 LAB — MAGNESIUM: Magnesium: 1.7 mg/dL (ref 1.7–2.4)

## 2022-12-07 MED ORDER — LIDOCAINE HCL (PF) 1 % IJ SOLN
5.0000 mL | Freq: Once | INTRAMUSCULAR | Status: DC
  Filled 2022-12-07: qty 30

## 2022-12-07 MED ORDER — LIDOCAINE HCL 1 % IJ SOLN: 5.0000 mL | Freq: Once | INTRAMUSCULAR | Status: DC

## 2022-12-07 NOTE — Consult Note (Signed)
ORTHOPAEDIC CONSULTATION  REQUESTING PHYSICIAN: Bonnell Public, MD  Chief Complaint: left knee pain  HPI: Christopher [name] is a 76 y.o. male who complains of left knee pain and swelling today. He has not fallen or recently injured the knee. He and his family report he hasn't left the hospital bed since Thursday so unsure if he can weight bear on it or not. Had some sort of left knee surgery "many years ago" but unsure what it was for. Review of his chart shows patella ORIF. He is currently admitted for an episode of DKA.   Imaging shows no acute osseous abnormality. Moderate-sized joint effusion and soft tissue swelling along the knee. Mild-to-moderate tricompartment osteoarthritis with findings that may suggest underlying crystalline arthropathy..    Orthopedics was consulted for evaluation.   Last meal 6:00pm tonight. Previously ambulatory.   Past Medical History:  Diagnosis Date   Diabetes mellitus type II    GERD (gastroesophageal reflux disease)    Glaucoma    Hyperlipidemia    Hypertension    Past Surgical History:  Procedure Laterality Date   COLONOSCOPY     HERNIA REPAIR     PATELLA FRACTURE SURGERY  1993   Social History   Socioeconomic History   Marital status: Married    Spouse name: Not on file   Number of children: Not on file   Years of education: Not on file   Highest education level: Not on file  Occupational History   Not on file  Tobacco Use   Smoking status: Never   Smokeless tobacco: Never  Vaping Use   Vaping Use: Never used  Substance and Sexual Activity   Alcohol use: No   Drug use: No   Sexual activity: Not on file  Other Topics Concern   Not on file  Social History Narrative   Not on file   Social Determinants of Health   Financial Resource Strain: Not on file  Food Insecurity: No Food Insecurity (10/06/2022)   Hunger Vital Sign    Worried About Running Out of Food in the Last Year: Never true    Ran Out of Food in the  Last Year: Never true  Transportation Needs: No Transportation Needs (10/06/2022)   PRAPARE - Hydrologist (Medical): No    Lack of Transportation (Non-Medical): No  Physical Activity: Sufficiently Active (05/10/2020)   Exercise Vital Sign    Days of Exercise per Week: 5 days    Minutes of Exercise per Session: 60 min  Stress: Not on file  Social Connections: Not on file   Family History  Problem Relation Age of Onset   Alcohol abuse Father    Diabetes Sister    Colon cancer Neg Hx    Esophageal cancer Neg Hx    Stomach cancer Neg Hx    Rectal cancer Neg Hx    No Known Allergies Prior to Admission medications   Medication Sig Start Date End Date Taking? Authorizing Provider  aspirin (ASPIRIN CHILDRENS) 81 MG chewable tablet Chew 1 tablet (81 mg total) by mouth daily. 10/07/22 01/05/23 Yes Vashti Hey, MD  atorvastatin (LIPITOR) 80 MG tablet Take 1 tablet (80 mg total) by mouth daily. 03/25/22  Yes Little Ishikawa, MD  brimonidine (ALPHAGAN) 0.15 % ophthalmic solution Place 1 drop into both eyes 2 (two) times daily.   Yes [provider]  Continuous Blood Gluc Receiver (DEXCOM G7 RECEIVER) DEVI Use to check blood sugar  11/19/22   Elayne Snare, MD  Continuous Blood Gluc Sensor (DEXCOM G6 SENSOR) MISC Change every 10 days 11/19/22   Elayne Snare, MD  Continuous Blood Gluc Transmit (DEXCOM G6 TRANSMITTER) MISC Use every 3 months 11/19/22   Elayne Snare, MD  dorzolamide-timolol (COSOPT) 22.3-6.8 MG/ML ophthalmic solution Place 1 drop into both eyes 2 (two) times daily.  03/03/11  Yes [provider]  insulin aspart (NOVOLOG FLEXPEN) 100 UNIT/ML FlexPen Inject 8-10 Units into the skin 3 (three) times daily with meals. Patient taking differently: Inject 10 Units into the skin 3 (three) times daily with meals. 08/14/22  Yes Elayne Snare, MD  insulin glargine (LANTUS SOLOSTAR) 100 UNIT/ML Solostar Pen Inject 20-38 Units into the skin See  admin instructions. 38 units in the morning, 20 units in the evening   Yes [provider]  Insulin Pen Needle 32G X 4 MM MISC Use as instructed to administer insulin 4-5X daily. E10.65 10/29/22   Elayne Snare, MD  lisinopril (ZESTRIL) 5 MG tablet Take 1 tablet (5 mg total) by mouth daily. 05/15/22  Yes Isaac Bliss, Rayford Halsted, MD  Multiple Vitamins-Minerals (MULTIVITAMIN ADULTS 50+) TABS Take 1 tablet by mouth daily.   Yes [provider]  Continuous Blood Gluc Receiver (FREESTYLE LIBRE 2 READER) DEVI 1 each by Does not apply route daily. 04/19/22   Renato Shin, MD  Continuous Blood Gluc Sensor (FREESTYLE LIBRE 2 SENSOR) MISC 1 Device by Does not apply route every 14 (fourteen) days. 04/03/22   Isaac Bliss, Rayford Halsted, MD  glucose blood [facility] VERIO) test strip 1 each by Other route 2 (two) times daily. And lancets 2/day 04/19/22   Renato Shin, MD  Insulin Syringe-Needle U-100 (BD VEO INSULIN SYRINGE U/F) 31G X 15/64" 1 ML MISC USE  SYRINGE TWICE DAILY 07/29/22   Elayne Snare, MD  potassium chloride (KLOR-CON) 10 MEQ tablet Take 1 tablet (10 mEq total) by mouth daily. 10 mEq daily Patient taking differently: Take 10 mEq by mouth daily. 08/20/22   Elayne Snare, MD   DG Knee Complete 4 Views Left  Result Date: 12/07/2022 CLINICAL DATA:  Pain and swelling of left knee EXAM: LEFT KNEE - COMPLETE 4+ VIEW COMPARISON:  None Available. FINDINGS: There is no evidence of acute fracture. There is tricompartment osteophyte formation with mild-to-moderate joint space narrowing. There is a moderate-sized joint effusion with some faint suprapatellar calcification. There is soft tissue swelling along the knee. Vascular calcifications. There is chondrocalcinosis and mild calcification in the region of the popliteus tendon origin. IMPRESSION: No acute osseous abnormality. Moderate-sized joint effusion and soft tissue swelling along the knee. Mild-to-moderate tricompartment osteoarthritis with  findings that may suggest underlying crystalline arthropathy. Electronically Signed   By: Maurine Simmering M.D.   On: 12/07/2022 16:44    Positive ROS: All other systems have been reviewed and were otherwise negative with the exception of those mentioned in the HPI and as above.  Objective: Labs cbc Recent Labs    12/05/22 0506 12/06/22 0548  WBC 12.2* 6.7  HGB 11.3* 12.6*  HCT 32.0* 36.7*  PLT 178 165    Labs inflam No results for input(s): "CRP" in the last 72 hours.  Invalid input(s): "ESR"  Labs coag No results for input(s): "INR", "PTT" in the last 72 hours.  Invalid input(s): "PT"  Recent Labs    12/06/22 0548 12/07/22 0338  NA 138 137  K 3.3* 3.4*  CL 106 108  CO2 24 22  GLUCOSE 98 260*  BUN 19  19  CREATININE 1.33* 1.37*  CALCIUM 9.1 8.8*    Physical Exam: Vitals:   12/07/22 0723 12/07/22 1124  BP: 126/78 (!) 148/93  Pulse: 68 71  Resp: 17 20  Temp: (!) 97.1 F (36.2 C) 98.3 F (36.8 C)  SpO2: 100% 100%   General: Alert, no acute distress.  Sitting up in bed, eating dinner, calm, comfortable Mental status: Alert and Oriented x3 Neurologic: Speech Clear and organized, no gross focal findings or movement disorder appreciated. Respiratory: No cyanosis, no use of accessory musculature Cardiovascular: No pedal edema GI: Abdomen is soft and non-tender, non-distended. Skin: Warm and dry.  Extremities: Warm and well perfused w/o edema Psychiatric: Patient is competent for consent with normal mood and affect  MUSCULOSKELETAL:  TTP medial and lateral joint lines left knee, non-tender elsewhere on knee, moderate effusion present, minimal amount of erythema on medial side of knee, slightly warm to the touch, about to tolerate ROM from about 15-30 degrees, intact ROM left ankle and foot, well healed incision from prior left patella ORIF, NVI  Other extremities are atraumatic with painless ROM and NVI.  Assessment / Plan: Principal Problem:   DKA (diabetic  ketoacidosis) ([place]) Active Problems:   Gastroenteritis   Elevated troponin   AKI (acute kidney injury) ([place])     Will plan to aspirate the left knee and send specimen to lab for fluid analysis to look for crystals and infection to r/o gout or septic arthritis from just a normal OA flare. Will not use steroids in the knee as it will increase his blood sugar and he is currently here with DKA issues.    Weightbearing: WBAT LLE Orthopedic device(s):  knee sleeve for compression VTE prophylaxis: Aspirin 62m qd  Pain control: per primary team Follow - up plan: PRN depending on lab results    MBritt BottomPA-C Office [phone]/23/2023 4:55 PM

## 2022-12-07 NOTE — Progress Notes (Signed)
Rounding Note    Patient Name: Christopher [name] Date of Encounter: 12/07/2022  [place] Cardiologist: Irish Lack  Subjective   Confusion last night given antipsychotic spoke with wife at bedside  Inpatient Medications    Scheduled Meds:  aspirin  81 mg Oral Daily   atorvastatin  80 mg Oral Daily   brimonidine  1 drop Both Eyes BID   dorzolamide-timolol  1 drop Both Eyes BID   insulin aspart  0-20 Units Subcutaneous TID WC   insulin aspart  0-5 Units Subcutaneous QHS   insulin glargine-yfgn  10 Units Subcutaneous QHS   insulin glargine-yfgn  30 Units Subcutaneous Daily   Continuous Infusions:  PRN Meds: acetaminophen, dextrose, OLANZapine, ondansetron (ZOFRAN) IV   Vital Signs    Vitals:   12/06/22 1922 12/06/22 2323 12/07/22 0325 12/07/22 0723  BP: (!) 162/103 (!) 144/86 131/78 126/78  Pulse: 87 74 69 68  Resp: (!) '26 19 17 17  '$ Temp: 97.9 F (36.6 C) 98.2 F (36.8 C) (!) 97.5 F (36.4 C) (!) 97.1 F (36.2 C)  TempSrc: Oral Oral Axillary Oral  SpO2: 97% 98% 100% 100%  Weight:      Height:        Intake/Output Summary (Last 24 hours) at 12/07/2022 1118 Last data filed at 12/07/2022 0800 Gross per 24 hour  Intake 300 ml  Output 4625 ml  Net -4325 ml      12/03/2022    3:47 AM 11/29/2022    9:00 AM 11/18/2022    6:23 PM  Last 3 Weights  Weight (lbs) 206 lb 206 lb 12.7 oz 208 lb  Weight (kg) 93.441 kg 93.8 kg 94.348 kg      Telemetry    NSR - Personally Reviewed  ECG    NSR, ST changes have resolved - Personally Reviewed  Physical Exam   Sleeping Lungs clear Neuro non focal No murmur  Abdomen benign No edema  Labs    High Sensitivity Troponin:   Recent Labs  Lab 12/03/22 1230 12/03/22 1459 12/03/22 1634 12/04/22 0606 12/06/22 0548  TROPONINIHS 2,884* 4,389* 4,344* 2,475* 737*     Chemistry Recent Labs  Lab 12/03/22 0758 12/03/22 1230 12/04/22 0157 12/06/22 0548 12/07/22 0338  NA 138   < > 145 138 137  K 4.6    < > 4.1 3.3* 3.4*  CL 100   < > 116* 106 108  CO2 9*   < > 21* 24 22  GLUCOSE 978*   < > 246* 98 260*  BUN 61*   < > 43* 19 19  CREATININE 3.03*   < > 2.07* 1.33* 1.37*  CALCIUM 8.0*   < > 8.1* 9.1 8.8*  MG 2.2  --   --  1.6* 1.7  ALBUMIN  --   --   --  3.0* 2.9*  GFRNONAA 21*   < > 33* 56* 54*  ANIONGAP 29*   < > '8 8 7   '$ < > = values in this interval not displayed.    Lipids No results for input(s): "CHOL", "TRIG", "HDL", "LABVLDL", "LDLCALC", "CHOLHDL" in the last 168 hours.  Hematology Recent Labs  Lab 12/03/22 0400 12/03/22 0406 12/05/22 0506 12/06/22 0548  WBC 19.4*  --  12.2* 6.7  RBC 4.92  --  4.35 4.95  HGB 12.7* 13.9 11.3* 12.6*  HCT 38.7* 41.0 32.0* 36.7*  MCV 78.7*  --  73.6* 74.1*  MCH 25.8*  --  26.0 25.5*  MCHC 32.8  --  35.3 34.3  RDW 15.3  --  14.3 14.3  PLT 310  --  178 165   Thyroid No results for input(s): "TSH", "FREET4" in the last 168 hours.  BNPNo results for input(s): "BNP", "PROBNP" in the last 168 hours.  DDimer No results for input(s): "DDIMER" in the last 168 hours.   Radiology    No results found.  Cardiac Studies   Echo showed EF 50-55%; LDL 88 in June 2023;   Patient Profile     76 y.o. male with demand ischemia, type II MI in the setting of DKA.   Assessment & Plan    Elevated Troponin:  in setting of DKA Per Dr Irish Lack no immediate cath due to confusion and issues getting BS under control Troponin trending down ECG improved Plan is for outpatient lexiscan myovue and f/u with Dr Irish Lack TTE Low risk with EF 50-55% and mild MR   Hyperlipidemia: Continue high-dose statin.  DM: A1c 10.1 in August 2023.  Continue to try to improve control.  Looking back, his A1c has been over 8 for or several years.        For questions or updates, please contact [place] Please consult [email or web address] for contact info under        Signed, Jenkins Rouge, MD  12/07/2022, 11:18 AM

## 2022-12-07 NOTE — Progress Notes (Signed)
Consultation Progress Note   Patient: Christopher [name] [id number]:[id number] DOB: 08/11/1946 DOA: 12/03/2022 DOS: the patient was seen and examined on 12/07/2022 Primary service: Bonnell Public, MD  Brief hospital course: Christopher [name] is a 76 y.o. male with medical history significant of IDDM with insulin resistance, HTN, HLD, glaucoma, GERD, CKD stage II, brought in by family member for confusion and hyperglycemia.   Symptoms started Saturday/Sunday, patient started to develop cramping-like epigastric pain along with very nauseous and frequent vomiting of stomach content nonbilious nonbloody, and loose bowel movement but denies any nystagmus.  He also runs episodes of subjective fever and chills and symptoms of Monday.  During which time, patient did not take any of his insulins for last 3 days.  Feels nauseous vomiting and abdominal pain symptoms significantly resolved this morning and started to feel hungry but he continued to have diarrhea 2 times this morning which is watery.  Family checked his glucose and midnight which was > 600 and patient was seen breathing fast, decided to bring him into the ED.  He denied any chest pain, no shortness of breath.   ED Course: Tachycardia, borderline hypotensive not hypoxic.  Lab work showed fingerstick more than thousand, bicarb 9, creatinine 0.0 compared to baseline 1.4 last week, glucose 978, beta hydroxy elevated.  Patient received a total of 4.8 L IV bolus and insulin drip started in the ED this morning.   Initial EKG showed ST depression 2, 3 and aVF with heart rate in the 130s, troponins 491> 581 cardiology consulted, heparin drip started in the ED.  12/05/2022: Patient seen alongside patient's wife and daughter.  Also discussed extensively with patient's nurse.  Patient is slowly improving.  Altered mentation is improving.  Cardiology input is appreciated.  Cardiology team is managing elevated troponin.  Blood sugar control is improving.  Patient will  need diabetic educator's input.  Possible discharge tomorrow.  Patient will need to follow-up closely with the primary care provider and endocrinologist on discharge.  No chest pain or shortness of breath endorsed.  No fever or chills.  Renal panel in the morning, magnesium, CBC and A1c. 12/06/2022: Patient seen alongside patient's daughter.  Episode of confusion reported earlier in the day.  Patient has continued to have intermittent confusion.  Awaiting diabetic educator input.  Blood sugar control is improving.  Likely discharge in the next 1 to 2 days if intermittent confusion improves. 12/07/2022: Patient seen alongside patient's daughter and wife.  Patient is admitted sleepy today.  Apparently, patient was given IM olanzapine 2.5 mg x 1 dose last night.  Left knee is swollen, painful and warm to touch.  Will check CRP and uric acid.  Orthopedic team has been consulted. Orthopedic team has advised x-ray of the left knee.   Assessment and Plan: Principal Problem:   DKA (diabetic ketoacidosis) ([place]) Active Problems:   Gastroenteritis   Elevated troponin   AKI (acute kidney injury) ([place])  (please populate well all problems here in Problem List. (For example, if patient is on BP meds at home and you resume or decide to hold them, it is a problem that needs to be her. Same for CAD, COPD, HLD and so on)     DKA: -Resolved Presented with DKA, was on insulin drip, currently discontinued 12/05/2022: Patient will need diabetic education.  Patient's family will also need diabetic education.  Blood sugar control is improving.  Patient will need to follow-up closely with primary care provider and endocrinologist on discharge.  Guarded long-term prognosis. 12/07/2022: Input from the diabetic educator is highly appreciated.  Patient will follow-up with an endocrinologist uncontrolled.   Delirium/encephalopathy: -Improved significantly, but continues intermittently.   12/07/2022: Patient was given IM  olanzapine last night.  Patient remains very sleepy.   NSTEMI -No chest pain but significant elevation of troponins 490> 581> 1000 with persistent ST changes depressions and T wave inversions on 2 3 aVF in the morning and additional ST depression and T inversion on V5 and V6 this afternoon.  Cardiology following, Echo ordered and pending -Continue heparin drip. -ASA, statin -Undecided on Cath timing due to kidney function -Resume lisinopril when kidney function stabilized. 12/06/2022: Cardiology team is managing.  For outpatient pharmacologic cardiac stress test.   AKI on CKD stage IIIa: -Likely prerenal from volume contraction and dehydration from repeated nauseous vomiting and diarrhea from putative gastroenteritis -Clinically appears to be dry, will give 1 more liter of bolus, and continue high rate of IV fluids. 12/05/2022: Suspect AKI secondary to combination of prerenal/ATN, in the setting of severe DKA. 12/06/2022: AKI has resolved significantly.  Patient may be close to his baseline. 12/07/2022: Renal function is back to baseline.  Patient has baseline chronic kidney disease.  SIRS -Secondary to DKA and possibly also reactive to gastroenteritis -Chest x-ray no significant infiltrates, UA showed no pyuria and diarrhea likely part of the GI symptoms from gastroenteritis, hold off antibiotics. 12/05/2022: Sepsis physiology has resolved.    Probably acute gastroenteritis -Symptoms started Saturday-Sunday, most of the upper GI symptoms including nauseous vomiting have improved but still having diarrhea.  On physical exam, abdomen exam benign, no indication for further GI workup 12/05/2022: Resolved.  Left knee swelling and pain: -No prior diagnosis of gouty arthritis. -Check uric acid and CRP. -Consult orthopedic team. -Rule out gouty arthritis of left knee versus septic knee. -Orthopedic team as advised x-ray of the left knee.     DVT prophylaxis: Heparin drip has been  discontinued Code Status: Full code   Subjective:  -Seen alongside patient's daughter. -No chest pain. -No shortness of breath. -No fever or chills.   -Confusion reported earlier in the day.  Physical Exam: Vitals:   12/06/22 2323 12/07/22 0325 12/07/22 0723 12/07/22 1124  BP: (!) 144/86 131/78 126/78 (!) 148/93  Pulse: 74 69 68 71  Resp: '19 17 17 20  '$ Temp: 98.2 F (36.8 C) (!) 97.5 F (36.4 C) (!) 97.1 F (36.2 C) 98.3 F (36.8 C)  TempSrc: Oral Axillary Oral Oral  SpO2: 98% 100% 100% 100%  Weight:      Height:       General condition: Not in any distress.  Awake and alert. HEENT: Patient is pale. Neck: Supple. Lungs: Clear to auscultation. CVS: S1-S2. Abdomen: Soft and nontender. Neuro: Awake and alert.  Moves all extremities. Extremities: No leg edema. Swelling of the left knee, warm to touch and tender   Data Reviewed:   Family Communication:  -Wife and daughter.  Time spent: 35 minutes.  Author: Bonnell Public, MD 12/07/2022 4:07 PM  For on call review [email or web address].

## 2022-12-07 NOTE — Progress Notes (Signed)
Pt report pain and discomfort to his left knee. Pt assessed to be winching and grimacing when knee is touched. Left knee has some redness, warm to touch and swollen compared to right knee. Dr. Marthenia Rolling notified and acknowledge. No new orders received. Will continue to monitor pt. Ice pack to be applied. Delia Heady RN

## 2022-12-07 NOTE — Procedures (Signed)
PATIENT:  Christopher [name]   PRE-PROCEDURE DIAGNOSIS:  left knee pain and effusion POST-OPERATIVE DIAGNOSIS:  Same PROCEDURE:  Aspiration / Intra-articular injection left knee  PROCEDURE DETAILS:  After informed verbal consent was obtained the superolateral portal was prepped with chlorhexidine. Approximately 5 mL of lidocaine were injected superficially and then into the deeper tissues and into the joint space. Flash identified appropriate positioning and pressurized joint space. Medicine was left to sit for several minutes to take effect. Next, a 30 mL syringe with an 18-gauge needle was used to aspirate approximately 80m of yellow joint fluid. It was slightly cloudy but for the most part appeared normal. He tolerated this well without complication and a Band-Aid was placed.  The specimen was sent to the lab for analysis.   MBritt BottomPA-C 12/07/2022 6:40 PM

## 2022-12-08 DIAGNOSIS — E1010 Type 1 diabetes mellitus with ketoacidosis without coma: Secondary | ICD-10-CM | POA: Diagnosis not present

## 2022-12-08 DIAGNOSIS — R7989 Other specified abnormal findings of blood chemistry: Secondary | ICD-10-CM | POA: Diagnosis not present

## 2022-12-08 LAB — SYNOVIAL CELL COUNT + DIFF, W/ CRYSTALS
Crystals, Fluid: NONE SEEN
Eosinophils-Synovial: 0 % (ref 0–1)
Lymphocytes-Synovial Fld: 4 % (ref 0–20)
Monocyte-Macrophage-Synovial Fluid: 15 % — ABNORMAL LOW (ref 50–90)
Neutrophil, Synovial: 81 % — ABNORMAL HIGH (ref 0–25)
WBC, Synovial: 6150 /mm3 — ABNORMAL HIGH (ref 0–200)

## 2022-12-08 LAB — GLUCOSE, CAPILLARY
Glucose-Capillary: 325 mg/dL — ABNORMAL HIGH (ref 70–99)
Glucose-Capillary: 261 mg/dL — ABNORMAL HIGH (ref 70–99)
Glucose-Capillary: 168 mg/dL — ABNORMAL HIGH (ref 70–99)
Glucose-Capillary: 184 mg/dL — ABNORMAL HIGH (ref 70–99)

## 2022-12-08 MED ORDER — ENOXAPARIN SODIUM 40 MG/0.4ML IJ SOSY
40.0000 mg | PREFILLED_SYRINGE | INTRAMUSCULAR | Status: DC
  Administered 2022-12-08 – 2022-12-10 (×3): 40 mg via SUBCUTANEOUS
  Filled 2022-12-08 (×3): qty 0.4

## 2022-12-08 MED ORDER — POLYETHYLENE GLYCOL 3350 17 G PO PACK
17.0000 g | PACK | Freq: Every day | ORAL | Status: DC
  Administered 2022-12-08 – 2022-12-10 (×3): 17 g via ORAL
  Filled 2022-12-08 (×3): qty 1

## 2022-12-08 NOTE — Progress Notes (Signed)
Rounding Note    Patient Name: Christopher [name] Date of Encounter: 12/08/2022  [place] Cardiologist: Irish Lack  Subjective   MS better this am taking some PO   Inpatient Medications    Scheduled Meds:  aspirin  81 mg Oral Daily   atorvastatin  80 mg Oral Daily   brimonidine  1 drop Both Eyes BID   dorzolamide-timolol  1 drop Both Eyes BID   enoxaparin (LOVENOX) injection  40 mg Subcutaneous Q24H   insulin aspart  0-20 Units Subcutaneous TID WC   insulin aspart  0-5 Units Subcutaneous QHS   insulin glargine-yfgn  10 Units Subcutaneous QHS   insulin glargine-yfgn  30 Units Subcutaneous Daily   lidocaine (PF)  5 mL Intradermal Once   polyethylene glycol  17 g Oral Daily   Continuous Infusions:  PRN Meds: acetaminophen, dextrose, ondansetron (ZOFRAN) IV   Vital Signs    Vitals:   12/07/22 1930 12/07/22 2308 12/08/22 0259 12/08/22 0718  BP: (!) 142/93 111/75 134/87 (!) 153/82  Pulse:  76  81  Resp:  19  18  Temp: 98.5 F (36.9 C) 98.7 F (37.1 C) 97.7 F (36.5 C) 98.6 F (37 C)  TempSrc: Axillary Axillary Axillary Oral  SpO2:  100%  100%  Weight:      Height:        Intake/Output Summary (Last 24 hours) at 12/08/2022 1022 Last data filed at 12/08/2022 0700 Gross per 24 hour  Intake --  Output 700 ml  Net -700 ml      12/03/2022    3:47 AM 11/29/2022    9:00 AM 11/18/2022    6:23 PM  Last 3 Weights  Weight (lbs) 206 lb 206 lb 12.7 oz 208 lb  Weight (kg) 93.441 kg 93.8 kg 94.348 kg      Telemetry    NSR - Personally Reviewed  ECG    NSR, ST changes have resolved - Personally Reviewed  Physical Exam   Alert and conversant  Lungs clear Neuro non focal No murmur  Abdomen benign No edema Left TKR post tap with continued swelling   Labs    High Sensitivity Troponin:   Recent Labs  Lab 12/03/22 1230 12/03/22 1459 12/03/22 1634 12/04/22 0606 12/06/22 0548  TROPONINIHS 2,884* 4,389* 4,344* 2,475* 737*      Chemistry Recent Labs  Lab 12/03/22 0758 12/03/22 1230 12/04/22 0157 12/06/22 0548 12/07/22 0338  NA 138   < > 145 138 137  K 4.6   < > 4.1 3.3* 3.4*  CL 100   < > 116* 106 108  CO2 9*   < > 21* 24 22  GLUCOSE 978*   < > 246* 98 260*  BUN 61*   < > 43* 19 19  CREATININE 3.03*   < > 2.07* 1.33* 1.37*  CALCIUM 8.0*   < > 8.1* 9.1 8.8*  MG 2.2  --   --  1.6* 1.7  ALBUMIN  --   --   --  3.0* 2.9*  GFRNONAA 21*   < > 33* 56* 54*  ANIONGAP 29*   < > '8 8 7   '$ < > = values in this interval not displayed.    Lipids No results for input(s): "CHOL", "TRIG", "HDL", "LABVLDL", "LDLCALC", "CHOLHDL" in the last 168 hours.  Hematology Recent Labs  Lab 12/03/22 0400 12/03/22 0406 12/05/22 0506 12/06/22 0548  WBC 19.4*  --  12.2* 6.7  RBC 4.92  --  4.35 4.95  HGB 12.7* 13.9 11.3* 12.6*  HCT 38.7* 41.0 32.0* 36.7*  MCV 78.7*  --  73.6* 74.1*  MCH 25.8*  --  26.0 25.5*  MCHC 32.8  --  35.3 34.3  RDW 15.3  --  14.3 14.3  PLT 310  --  178 165   Thyroid No results for input(s): "TSH", "FREET4" in the last 168 hours.  BNPNo results for input(s): "BNP", "PROBNP" in the last 168 hours.  DDimer No results for input(s): "DDIMER" in the last 168 hours.   Radiology    DG Knee Complete 4 Views Left  Result Date: 12/07/2022 CLINICAL DATA:  Pain and swelling of left knee EXAM: LEFT KNEE - COMPLETE 4+ VIEW COMPARISON:  None Available. FINDINGS: There is no evidence of acute fracture. There is tricompartment osteophyte formation with mild-to-moderate joint space narrowing. There is a moderate-sized joint effusion with some faint suprapatellar calcification. There is soft tissue swelling along the knee. Vascular calcifications. There is chondrocalcinosis and mild calcification in the region of the popliteus tendon origin. IMPRESSION: No acute osseous abnormality. Moderate-sized joint effusion and soft tissue swelling along the knee. Mild-to-moderate tricompartment osteoarthritis with findings that may  suggest underlying crystalline arthropathy. Electronically Signed   By: Maurine Simmering M.D.   On: 12/07/2022 16:44    Cardiac Studies   Echo showed EF 50-55%; LDL 88 in June 2023;   Patient Profile     76 y.o. male with demand ischemia, type II MI in the setting of DKA.   Assessment & Plan    Elevated Troponin:  in setting of DKA Per Dr Irish Lack no immediate cath due to confusion and issues getting BS under control Troponin trending down ECG improved Plan is for outpatient lexiscan myovue and f/u with Dr Irish Lack TTE Low risk with EF 50-55% and mild MR   Hyperlipidemia: Continue high-dose statin.  DM: A1c 10.1 in August 2023.  Continue to try to improve control.  Looking back, his A1c has been over 8 for or several years.  Delerium: clearing yesterday was bad due to IM olanzapine givne   Ortho:  left knee effusion post tap ? Gout with moderate arthritis         For questions or updates, please contact [place] Please consult [email or web address] for contact info under        Signed, Jenkins Rouge, MD  12/08/2022, 10:22 AM

## 2022-12-08 NOTE — Progress Notes (Signed)
Orthopedic Tech Progress Note Patient Details:  Christopher [name] 1946/05/18 [id number]  Compressive neoprene knee sleeve applied to LLE.  Ortho Devices Type of Ortho Device: Knee Sleeve Ortho Device/Splint Location: LLE Ortho Device/Splint Interventions: Ordered, Application, Adjustment   Post Interventions Patient Tolerated: Well Instructions Provided: Care of device  [name] Jeri Modena 12/08/2022, 11:01 AM

## 2022-12-08 NOTE — Progress Notes (Signed)
Reported constipation and no BM since admission. MD was notified and order for miralax daily was received. Miralax was administered but no effective results yet; MD notified. Will continue to closely monitor and report off to oncoming RN. Delia Heady RN

## 2022-12-08 NOTE — Progress Notes (Signed)
Consultation Progress Note   Patient: Christopher [name] ESP:[id number] DOB: 1946/01/11 DOA: 12/03/2022 DOS: the patient was seen and examined on 12/08/2022 Primary service: Bonnell Public, MD  Brief hospital course: Christopher [name] is a 76 y.o. male with medical history significant of IDDM with insulin resistance, HTN, HLD, glaucoma, GERD, CKD stage II, brought in by family member for confusion and hyperglycemia.   Symptoms started Saturday/Sunday, patient started to develop cramping-like epigastric pain along with very nauseous and frequent vomiting of stomach content nonbilious nonbloody, and loose bowel movement but denies any nystagmus.  He also runs episodes of subjective fever and chills and symptoms of Monday.  During which time, patient did not take any of his insulins for last 3 days.  Feels nauseous vomiting and abdominal pain symptoms significantly resolved this morning and started to feel hungry but he continued to have diarrhea 2 times this morning which is watery.  Family checked his glucose and midnight which was > 600 and patient was seen breathing fast, decided to bring him into the ED.  He denied any chest pain, no shortness of breath.   ED Course: Tachycardia, borderline hypotensive not hypoxic.  Lab work showed fingerstick more than thousand, bicarb 9, creatinine 0.0 compared to baseline 1.4 last week, glucose 978, beta hydroxy elevated.  Patient received a total of 4.8 L IV bolus and insulin drip started in the ED this morning.   Initial EKG showed ST depression 2, 3 and aVF with heart rate in the 130s, troponins 491> 581 cardiology consulted, heparin drip started in the ED.  12/05/2022: Patient seen alongside patient's wife and daughter.  Also discussed extensively with patient's nurse.  Patient is slowly improving.  Altered mentation is improving.  Cardiology input is appreciated.  Cardiology team is managing elevated troponin.  Blood sugar control is improving.  Patient will  need diabetic educator's input.  Possible discharge tomorrow.  Patient will need to follow-up closely with the primary care provider and endocrinologist on discharge.  No chest pain or shortness of breath endorsed.  No fever or chills.  Renal panel in the morning, magnesium, CBC and A1c. 12/06/2022: Patient seen alongside patient's daughter.  Episode of confusion reported earlier in the day.  Patient has continued to have intermittent confusion.  Awaiting diabetic educator input.  Blood sugar control is improving.  Likely discharge in the next 1 to 2 days if intermittent confusion improves. 12/07/2022: Patient seen alongside patient's daughter and wife.  Patient is admitted sleepy today.  Apparently, patient was given IM olanzapine 2.5 mg x 1 dose last night.  Left knee is swollen, painful and warm to touch.  Will check CRP and uric acid.  Orthopedic team has been consulted. Orthopedic team has advised x-ray of the left knee. 12/08/2022: Patient seen.  Orthopedic input is appreciated.  Patient underwent arthrocentesis of the left knee and 80 mL of yellow joint fluid was aspirated.  Joint fluid analysis is negative for crystals.  Joint fluid reveals greater than 6000 WBC, but no bacteria seen.  Will await cultures (?  Reactive versus septic left knee arthritis).    Assessment and Plan: Principal Problem:   DKA (diabetic ketoacidosis) ([place]) Active Problems:   Gastroenteritis   Elevated troponin   AKI (acute kidney injury) ([place])  (please populate well all problems here in Problem List. (For example, if patient is on BP meds at home and you resume or decide to hold them, it is a problem that needs to be her. Same  for CAD, COPD, HLD and so on)     DKA: -Resolved Presented with DKA, was on insulin drip, currently discontinued 12/05/2022: Patient will need diabetic education.  Patient's family will also need diabetic education.  Blood sugar control is improving.  Patient will need to follow-up closely  with primary care provider and endocrinologist on discharge.  Guarded long-term prognosis. 12/07/2022: Input from the diabetic educator is highly appreciated.  Patient will follow-up with an endocrinologist uncontrolled.   Delirium/encephalopathy: -Improved significantly, but continues intermittently.   12/07/2022: Patient was given IM olanzapine last night.  Patient remains very sleepy. 12/08/2022: No new changes.   NSTEMI -No chest pain but significant elevation of troponins 490> 581> 1000 with persistent ST changes depressions and T wave inversions on 2 3 aVF in the morning and additional ST depression and T inversion on V5 and V6 this afternoon.  Cardiology following, Echo ordered and pending -Continue heparin drip. -ASA, statin -Undecided on Cath timing due to kidney function -Resume lisinopril when kidney function stabilized. 12/06/2022: Cardiology team is managing.  For outpatient pharmacologic cardiac stress test.   AKI on CKD stage IIIa: -Likely prerenal from volume contraction and dehydration from repeated nauseous vomiting and diarrhea from putative gastroenteritis -Clinically appears to be dry, will give 1 more liter of bolus, and continue high rate of IV fluids. 12/05/2022: Suspect AKI secondary to combination of prerenal/ATN, in the setting of severe DKA. 12/06/2022: AKI has resolved significantly.  Patient may be close to his baseline. 12/07/2022: Renal function is back to baseline.  Patient has baseline chronic kidney disease.  SIRS -Secondary to DKA and possibly also reactive to gastroenteritis -Chest x-ray no significant infiltrates, UA showed no pyuria and diarrhea likely part of the GI symptoms from gastroenteritis, hold off antibiotics. 12/05/2022: Sepsis physiology has resolved.    Probably acute gastroenteritis -Symptoms started Saturday-Sunday, most of the upper GI symptoms including nauseous vomiting have improved but still having diarrhea.  On physical exam,  abdomen exam benign, no indication for further GI workup 12/05/2022: Resolved.  Left knee swelling and pain: -No prior diagnosis of gouty arthritis. -Check uric acid and CRP. -Consult orthopedic team. -Rule out gouty arthritis of left knee versus septic knee. -Orthopedic team as advised x-ray of the left knee. 12/08/2021: See above documentation.     DVT prophylaxis: Heparin drip has been discontinued Code Status: Full code   Subjective:  -No chest pain. -No shortness of breath. -No fever or chills.   -Patient is s/p aspiration of left knee effusion.  Physical Exam: Vitals:   12/07/22 2308 12/08/22 0259 12/08/22 0718 12/08/22 1215  BP: 111/75 134/87 (!) 153/82 122/78  Pulse: 76  81 81  Resp: '19  18 16  '$ Temp: 98.7 F (37.1 C) 97.7 F (36.5 C) 98.6 F (37 C) 98.1 F (36.7 C)  TempSrc: Axillary Axillary Oral Oral  SpO2: 100%  100% 100%  Weight:      Height:       General condition: Not in any distress.  Awake and alert. HEENT: Patient is pale. Neck: Supple. Lungs: Clear to auscultation. CVS: S1-S2. Abdomen: Soft and nontender. Neuro: Awake and alert.  Moves all extremities. Extremities: No leg edema.  Left knee swelling has resolved.   Data Reviewed:   Family Communication:  -Wife and daughter.  Time spent: 35 minutes.  Author: Bonnell Public, MD 12/08/2022 1:56 PM  For on call review [email or web address].

## 2022-12-08 NOTE — Progress Notes (Addendum)
   ORTHOPAEDIC PROGRESS NOTE  s/p left knee aspiration  SUBJECTIVE: Patient states his left knee feels some better today. He has knee brace in place. Resting comfortably in hospital bed.   OBJECTIVE: PE: Left knee: mild to moderate effusion, tender to palpation medial and lateral joint lines, did not appreciate any redness or warmth, range of motion from 0- 60 degrees, distal motor and sensory function intact   Vitals:   12/08/22 0259 12/08/22 0718  BP: 134/87 (!) 153/82  Pulse:  81  Resp:  18  Temp: 97.7 F (36.5 C) 98.6 F (37 C)  SpO2:  100%   Culture results currently showing no growth. No crystals seen.  ASSESSMENT: Christopher [name] is a 76 y.o. male with left knee pain and swelling   PLAN: Weightbearing: WBAT LLE Orthopedic device(s):  knee sleeve for compression VTE prophylaxis: Aspirin '81mg'$  qd  Pain control: per primary team Follow - up plan: PRN depending on lab results   Contact information:   After hours and holidays please check [email or web address] for group call information for [facility]  Noemi Chapel, PA-C 12/08/2022

## 2022-12-09 DIAGNOSIS — R7989 Other specified abnormal findings of blood chemistry: Secondary | ICD-10-CM | POA: Diagnosis not present

## 2022-12-09 DIAGNOSIS — E1010 Type 1 diabetes mellitus with ketoacidosis without coma: Secondary | ICD-10-CM | POA: Diagnosis not present

## 2022-12-09 DIAGNOSIS — N179 Acute kidney failure, unspecified: Secondary | ICD-10-CM | POA: Diagnosis not present

## 2022-12-09 LAB — GLUCOSE, CAPILLARY
Glucose-Capillary: 174 mg/dL — ABNORMAL HIGH (ref 70–99)
Glucose-Capillary: 248 mg/dL — ABNORMAL HIGH (ref 70–99)
Glucose-Capillary: 210 mg/dL — ABNORMAL HIGH (ref 70–99)
Glucose-Capillary: 238 mg/dL — ABNORMAL HIGH (ref 70–99)

## 2022-12-09 MED ORDER — ALUM & MAG HYDROXIDE-SIMETH 200-200-20 MG/5ML PO SUSP
30.0000 mL | ORAL | Status: DC | PRN
  Administered 2022-12-09: 30 mL via ORAL
  Filled 2022-12-09: qty 30

## 2022-12-09 MED ORDER — SENNOSIDES-DOCUSATE SODIUM 8.6-50 MG PO TABS
1.0000 | ORAL_TABLET | Freq: Two times a day (BID) | ORAL | Status: DC
  Administered 2022-12-09 – 2022-12-10 (×3): 1 via ORAL
  Filled 2022-12-09 (×3): qty 1

## 2022-12-09 MED ORDER — DOCUSATE SODIUM 100 MG PO CAPS
100.0000 mg | ORAL_CAPSULE | Freq: Two times a day (BID) | ORAL | Status: DC
  Administered 2022-12-09 – 2022-12-10 (×3): 100 mg via ORAL
  Filled 2022-12-09 (×3): qty 1

## 2022-12-09 MED ORDER — ORAL CARE MOUTH RINSE: 15.0000 mL | OROMUCOSAL | Status: DC | PRN

## 2022-12-09 NOTE — Evaluation (Signed)
Physical Therapy Evaluation Patient Details Name: Christopher [name] MRN: [id number] DOB: 1946-03-25 Today's Date: 12/09/2022  History of Present Illness  Pt is a 76 y.o. male admitted 12/03/22 with confusion and hyperglycemia. Workup for DKA, SIRS. Pt with L knee pain, s/p aspiration and injection 12/23. Of note, recent admission 12/03/2022 with AMS, DKA. PMH includes DM2, HTN, HLD, CKD2, glaucoma.   Clinical Impression  Pt presents with an overall decrease in functional mobility secondary to above. PTA, pt independent with mobility, lives with wife. Today, pt only oriented to self and partially to date; wife reports baseline cognition WFL. Pt able to transfer and ambulate with RW to offload painful LLE, frequent cues for safety and sequencing. Spoke with wife on phone to verify information; wife reports family able to provide necessary assist at home. Pt hopeful for d/c home today. If to remain admitted, will follow acutely to address established goals.    Recommendations for follow up therapy are one component of a multi-disciplinary discharge planning process, led by the attending physician.  Recommendations may be updated based on patient status, additional functional criteria and insurance authorization.  Follow Up Recommendations Home health PT      Assistance Recommended at Discharge Frequent or constant Supervision/Assistance  Patient can return home with the following  A little help with walking and/or transfers;A little help with bathing/dressing/bathroom;Assistance with cooking/housework;Direct supervision/assist for medications management;Direct supervision/assist for financial management;Assist for transportation;Help with stairs or ramp for entrance    Equipment Recommendations Rolling walker (2 wheels)  Recommendations for Other Services   Mobility Specialist    Functional Status Assessment Patient has had a recent decline in their functional status and demonstrates the ability  to make significant improvements in function in a reasonable and predictable amount of time.     Precautions / Restrictions Precautions Precautions: Fall Restrictions Weight Bearing Restrictions: Yes LLE Weight Bearing: Weight bearing as tolerated      Mobility  Bed Mobility Overal bed mobility: Modified Independent                  Transfers Overall transfer level: Needs assistance Equipment used: Rolling walker (2 wheels) Transfers: Sit to/from Stand Sit to Stand: Min guard           General transfer comment: increased time and effort from low bed height, initial stand with posterior LOB back to sitting EOB, improved stability on subsequent trial; able to stand from low EOB and recliner to RW with min guard    Ambulation/Gait Ambulation/Gait assistance: Min guard Gait Distance (Feet): 60 Feet Assistive device: Rolling walker (2 wheels) Gait Pattern/deviations: Step-to pattern, Step-through pattern, Decreased stride length, Trunk flexed, Antalgic, Decreased weight shift to left Gait velocity: Decreased     General Gait Details: slow, antalgic gait with RW and min guard for balance; pt requiring cues for upright posture, increased step length and closer proximity to RW; pt walking 20' + 40' with brief seated rest break  Stairs            Wheelchair Mobility    Modified Rankin (Stroke Patients Only)       Balance Overall balance assessment: Needs assistance Sitting-balance support: Feet supported Sitting balance-Leahy Scale: Good     Standing balance support: No upper extremity supported, Reliant on assistive device for balance Standing balance-Leahy Scale: Poor  Pertinent Vitals/Pain Pain Assessment Pain Assessment: Faces Faces Pain Scale: Hurts little more Pain Location: L knee Pain Descriptors / Indicators: Discomfort, Sore Pain Intervention(s): Monitored during session    Home Living  Family/patient expects to be discharged to:: Private residence Living Arrangements: Spouse/significant other Available Help at Discharge: Family;Available 24 hours/day Type of Home: House Home Access: Level entry       Home Layout: One level   Additional Comments: pt with confusion, wife able to verify info    Prior Function Prior Level of Function : Independent/Modified Independent;Driving             Mobility Comments: typically independent without DME ADLs Comments: independent     Hand Dominance        Extremity/Trunk Assessment   Upper Extremity Assessment Upper Extremity Assessment: Overall WFL for tasks assessed    Lower Extremity Assessment Lower Extremity Assessment: Generalized weakness;LLE deficits/detail LLE Deficits / Details: s/p L knee aspiration and injection 12/07/22    Cervical / Trunk Assessment Cervical / Trunk Assessment: Kyphotic  Communication   Communication: No difficulties  Cognition Arousal/Alertness: Awake/alert Behavior During Therapy: WFL for tasks assessed/performed Overall Cognitive Status: Impaired/Different from baseline Area of Impairment: Orientation, Attention, Memory, Following commands, Safety/judgement, Awareness, Problem solving                 Orientation Level: Disoriented to, Place, Time, Situation Current Attention Level: Selective Memory: Decreased short-term memory Following Commands: Follows one step commands with increased time Safety/Judgement: Decreased awareness of safety, Decreased awareness of deficits Awareness: Intellectual Problem Solving: Slow processing, Difficulty sequencing, Requires verbal cues General Comments: pt very disoriented, reports we are in a school, that he and his wife visiting family from [place] (pt from [place] originally, but now lives in [place]). spoke with wife on phone who reports pt not typically confused or disoriented        General Comments General comments (skin  integrity, edema, etc.): spoke with wife on phone to verify home set-up/PLOF and assist available at home    Exercises     Assessment/Plan    PT Assessment Patient needs continued PT services  PT Problem List Decreased strength;Decreased activity tolerance;Decreased balance;Decreased mobility;Decreased cognition;Decreased knowledge of use of DME;Decreased safety awareness;Pain       PT Treatment Interventions DME instruction;Gait training;Stair training;Functional mobility training;Therapeutic activities;Therapeutic exercise;Balance training;Cognitive remediation;Patient/family education    PT Goals (Current goals can be found in the Care Plan section)  Acute Rehab PT Goals Patient Stated Goal: return home PT Goal Formulation: With patient/family Time For Goal Achievement: 12/23/22 Potential to Achieve Goals: Good    Frequency Min 3X/week     Co-evaluation               AM-PAC PT "6 Clicks" Mobility  Outcome Measure Help needed turning from your back to your side while in a flat bed without using bedrails?: A Little Help needed moving from lying on your back to sitting on the side of a flat bed without using bedrails?: A Little Help needed moving to and from a bed to a chair (including a wheelchair)?: A Little Help needed standing up from a chair using your arms (e.g., wheelchair or bedside chair)?: A Little Help needed to walk in hospital room?: A Little Help needed climbing 3-5 steps with a railing? : A Little 6 Click Score: 18    End of Session   Activity Tolerance: Patient tolerated treatment well Patient left: in chair;with call bell/phone within reach;with chair alarm set  Nurse Communication: Mobility status PT Visit Diagnosis: Other abnormalities of gait and mobility (R26.89);Pain Pain - Right/Left: Left Pain - part of body: Knee    Time: 1216-1237 PT Time Calculation (min) (ACUTE ONLY): 21 min   Charges:   PT Evaluation $PT Eval Moderate Complexity: [place], PT, DPT Acute Rehabilitation Services  Personal: [place] [facility] Office: [place] 12/09/2022, 2:03 PM

## 2022-12-09 NOTE — Plan of Care (Signed)
  Problem: Education: Goal: Knowledge of disease or condition will improve Outcome: Progressing Goal: Knowledge of secondary prevention will improve (MUST DOCUMENT ALL) Outcome: Progressing Goal: Knowledge of patient specific risk factors will improve Elta [place] N/A or DELETE if not current risk factor) Outcome: Progressing   Problem: Ischemic Stroke/TIA Tissue Perfusion: Goal: Complications of ischemic stroke/TIA will be minimized Outcome: Progressing   Problem: Coping: Goal: Will verbalize positive feelings about self Outcome: Progressing Goal: Will identify appropriate support needs Outcome: Progressing   Problem: Health Behavior/Discharge Planning: Goal: Ability to manage health-related needs will improve Outcome: Progressing Goal: Goals will be collaboratively established with patient/family Outcome: Progressing   Problem: Self-Care: Goal: Ability to participate in self-care as condition permits will improve Outcome: Progressing Goal: Verbalization of feelings and concerns over difficulty with self-care will improve Outcome: Progressing Goal: Ability to communicate needs accurately will improve Outcome: Progressing   Problem: Nutrition: Goal: Risk of aspiration will decrease Outcome: Progressing Goal: Dietary intake will improve Outcome: Progressing   Problem: Safety: Goal: Non-violent Restraint(s) Outcome: Progressing   Problem: Education: Goal: Knowledge of General Education information will improve Description: Including pain rating scale, medication(s)/side effects and non-pharmacologic comfort measures Outcome: Progressing   Problem: Health Behavior/Discharge Planning: Goal: Ability to manage health-related needs will improve Outcome: Progressing   Problem: Clinical Measurements: Goal: Ability to maintain clinical measurements within normal limits will improve Outcome: Progressing Goal: Will remain free from infection Outcome: Progressing Goal: Diagnostic  test results will improve Outcome: Progressing Goal: Respiratory complications will improve Outcome: Progressing Goal: Cardiovascular complication will be avoided Outcome: Progressing   Problem: Activity: Goal: Risk for activity intolerance will decrease Outcome: Progressing   Problem: Nutrition: Goal: Adequate nutrition will be maintained Outcome: Progressing   Problem: Coping: Goal: Level of anxiety will decrease Outcome: Progressing   Problem: Elimination: Goal: Will not experience complications related to bowel motility Outcome: Progressing Goal: Will not experience complications related to urinary retention Outcome: Progressing   Problem: Pain Managment: Goal: General experience of comfort will improve Outcome: Progressing   Problem: Safety: Goal: Ability to remain free from injury will improve Outcome: Progressing   Problem: Skin Integrity: Goal: Risk for impaired skin integrity will decrease Outcome: Progressing   Problem: Education: Goal: Ability to describe self-care measures that may prevent or decrease complications (Diabetes Survival Skills Education) will improve Outcome: Progressing Goal: Individualized Educational Video(s) Outcome: Progressing   Problem: Coping: Goal: Ability to adjust to condition or change in health will improve Outcome: Progressing   Problem: Fluid Volume: Goal: Ability to maintain a balanced intake and output will improve Outcome: Progressing   Problem: Health Behavior/Discharge Planning: Goal: Ability to identify and utilize available resources and services will improve Outcome: Progressing Goal: Ability to manage health-related needs will improve Outcome: Progressing   Problem: Metabolic: Goal: Ability to maintain appropriate glucose levels will improve Outcome: Progressing   Problem: Nutritional: Goal: Maintenance of adequate nutrition will improve Outcome: Progressing Goal: Progress toward achieving an optimal  weight will improve Outcome: Progressing   Problem: Skin Integrity: Goal: Risk for impaired skin integrity will decrease Outcome: Progressing   Problem: Tissue Perfusion: Goal: Adequacy of tissue perfusion will improve Outcome: Progressing

## 2022-12-09 NOTE — Progress Notes (Signed)
Rounding Note    Patient Name: Christopher [name] Date of Encounter: 12/09/2022  [place] Cardiologist: None Christopher [name]  Subjective   No chest discomfort  Inpatient Medications    Scheduled Meds:  aspirin  81 mg Oral Daily   atorvastatin  80 mg Oral Daily   brimonidine  1 drop Both Eyes BID   docusate sodium  100 mg Oral BID   dorzolamide-timolol  1 drop Both Eyes BID   enoxaparin (LOVENOX) injection  40 mg Subcutaneous Q24H   insulin aspart  0-20 Units Subcutaneous TID WC   insulin aspart  0-5 Units Subcutaneous QHS   insulin glargine-yfgn  10 Units Subcutaneous QHS   insulin glargine-yfgn  30 Units Subcutaneous Daily   lidocaine (PF)  5 mL Intradermal Once   polyethylene glycol  17 g Oral Daily   senna-docusate  1 tablet Oral BID   Continuous Infusions:  PRN Meds: acetaminophen, dextrose, ondansetron (ZOFRAN) IV   Vital Signs    Vitals:   12/08/22 1913 12/08/22 2316 12/09/22 0325 12/09/22 0732  BP: 125/87 (!) 136/91 138/84 (!) 143/95  Pulse:  77 75 89  Resp:  '16 18 17  '$ Temp: 98.3 F (36.8 C) 98.4 F (36.9 C) 98.6 F (37 C) 97.8 F (36.6 C)  TempSrc: Oral Oral Oral Oral  SpO2:  100% 100% 100%  Weight:      Height:        Intake/Output Summary (Last 24 hours) at 12/09/2022 0945 Last data filed at 12/09/2022 0800 Gross per 24 hour  Intake 720 ml  Output 1500 ml  Net -780 ml      12/03/2022    3:47 AM 11/29/2022    9:00 AM 11/18/2022    6:23 PM  Last 3 Weights  Weight (lbs) 206 lb 206 lb 12.7 oz 208 lb  Weight (kg) 93.441 kg 93.8 kg 94.348 kg      Telemetry    Now off telemetry  ECG      Physical Exam   GEN: No acute distress.   Neck: No JVD Cardiac: RRR, no murmurs, rubs, or gallops.  Respiratory: Clear to auscultation bilaterally. GI: Soft, nontender, non-distended  MS: No edema; No deformity. Neuro:  Nonfocal  Psych: Normal affect   Labs    High Sensitivity Troponin:   Recent Labs  Lab 12/03/22 1230  12/03/22 1459 12/03/22 1634 12/04/22 0606 12/06/22 0548  TROPONINIHS 2,884* 4,389* 4,344* 2,475* 737*     Chemistry Recent Labs  Lab 12/03/22 0758 12/03/22 1230 12/04/22 0157 12/06/22 0548 12/07/22 0338  NA 138   < > 145 138 137  K 4.6   < > 4.1 3.3* 3.4*  CL 100   < > 116* 106 108  CO2 9*   < > 21* 24 22  GLUCOSE 978*   < > 246* 98 260*  BUN 61*   < > 43* 19 19  CREATININE 3.03*   < > 2.07* 1.33* 1.37*  CALCIUM 8.0*   < > 8.1* 9.1 8.8*  MG 2.2  --   --  1.6* 1.7  ALBUMIN  --   --   --  3.0* 2.9*  GFRNONAA 21*   < > 33* 56* 54*  ANIONGAP 29*   < > '8 8 7   '$ < > = values in this interval not displayed.    Lipids No results for input(s): "CHOL", "TRIG", "HDL", "LABVLDL", "LDLCALC", "CHOLHDL" in the last 168 hours.  Hematology Recent Labs  Lab 12/03/22 0400  12/03/22 0406 12/05/22 0506 12/06/22 0548  WBC 19.4*  --  12.2* 6.7  RBC 4.92  --  4.35 4.95  HGB 12.7* 13.9 11.3* 12.6*  HCT 38.7* 41.0 32.0* 36.7*  MCV 78.7*  --  73.6* 74.1*  MCH 25.8*  --  26.0 25.5*  MCHC 32.8  --  35.3 34.3  RDW 15.3  --  14.3 14.3  PLT 310  --  178 165   Thyroid No results for input(s): "TSH", "FREET4" in the last 168 hours.  BNPNo results for input(s): "BNP", "PROBNP" in the last 168 hours.  DDimer No results for input(s): "DDIMER" in the last 168 hours.   Radiology    DG Knee Complete 4 Views Left  Result Date: 12/07/2022 CLINICAL DATA:  Pain and swelling of left knee EXAM: LEFT KNEE - COMPLETE 4+ VIEW COMPARISON:  None Available. FINDINGS: There is no evidence of acute fracture. There is tricompartment osteophyte formation with mild-to-moderate joint space narrowing. There is a moderate-sized joint effusion with some faint suprapatellar calcification. There is soft tissue swelling along the knee. Vascular calcifications. There is chondrocalcinosis and mild calcification in the region of the popliteus tendon origin. IMPRESSION: No acute osseous abnormality. Moderate-sized joint effusion  and soft tissue swelling along the knee. Mild-to-moderate tricompartment osteoarthritis with findings that may suggest underlying crystalline arthropathy. Electronically Signed   By: Maurine Simmering M.D.   On: 12/07/2022 16:44    Cardiac Studies   LVEF 50 to 55%  Patient Profile     76 y.o. male elevated troponin in the setting of DKA  Assessment & Plan    No regional wall motion abnormalities noted on echo.  Will plan for outpatient nuclear stress test.  If high risk findings noted, would then proceed with cardiac catheterization.  Renal function has been improving, but we will try to avoid studies that require IV contrast dye if possible given his recent significant acute renal failure.  Patient is hopeful to go home later today.  Our office is aware that he will need to follow-up stress test scheduled.     For questions or updates, please contact [place] Please consult [email or web address] for contact info under        Signed, Larae Grooms, MD  12/09/2022, 9:45 AM

## 2022-12-09 NOTE — Progress Notes (Signed)
Consultation Progress Note   Patient: Christopher [name]:[id number] DOB: 1946/11/28 DOA: 12/03/2022 DOS: the patient was seen and examined on 12/09/2022 Primary service: Bonnell Public, MD  Brief hospital course: Christopher [name] is a 76 y.o. male with medical history significant of IDDM with insulin resistance, HTN, HLD, glaucoma, GERD, CKD stage II, brought in by family member for confusion and hyperglycemia.   Symptoms started Saturday/Sunday, patient started to develop cramping-like epigastric pain along with very nauseous and frequent vomiting of stomach content nonbilious nonbloody, and loose bowel movement but denies any nystagmus.  He also runs episodes of subjective fever and chills and symptoms of Monday.  During which time, patient did not take any of his insulins for last 3 days.  Feels nauseous vomiting and abdominal pain symptoms significantly resolved this morning and started to feel hungry but he continued to have diarrhea 2 times this morning which is watery.  Family checked his glucose and midnight which was > 600 and patient was seen breathing fast, decided to bring him into the ED.  He denied any chest pain, no shortness of breath.   ED Course: Tachycardia, borderline hypotensive not hypoxic.  Lab work showed fingerstick more than thousand, bicarb 9, creatinine 0.0 compared to baseline 1.4 last week, glucose 978, beta hydroxy elevated.  Patient received a total of 4.8 L IV bolus and insulin drip started in the ED this morning.   Initial EKG showed ST depression 2, 3 and aVF with heart rate in the 130s, troponins 491> 581 cardiology consulted, heparin drip started in the ED.  12/05/2022: Patient seen alongside patient's wife and daughter.  Also discussed extensively with patient's nurse.  Patient is slowly improving.  Altered mentation is improving.  Cardiology input is appreciated.  Cardiology team is managing elevated troponin.  Blood sugar control is improving.  Patient will  need diabetic educator's input.  Possible discharge tomorrow.  Patient will need to follow-up closely with the primary care provider and endocrinologist on discharge.  No chest pain or shortness of breath endorsed.  No fever or chills.  Renal panel in the morning, magnesium, CBC and A1c. 12/06/2022: Patient seen alongside patient's daughter.  Episode of confusion reported earlier in the day.  Patient has continued to have intermittent confusion.  Awaiting diabetic educator input.  Blood sugar control is improving.  Likely discharge in the next 1 to 2 days if intermittent confusion improves. 12/07/2022: Patient seen alongside patient's daughter and wife.  Patient is admitted sleepy today.  Apparently, patient was given IM olanzapine 2.5 mg x 1 dose last night.  Left knee is swollen, painful and warm to touch.  Will check CRP and uric acid.  Orthopedic team has been consulted. Orthopedic team has advised x-ray of the left knee. 12/08/2022: Patient seen.  Orthopedic input is appreciated.  Patient underwent arthrocentesis of the left knee and 80 mL of yellow joint fluid was aspirated.  Joint fluid analysis is negative for crystals.  Joint fluid reveals greater than 6000 WBC, but no bacteria seen.  Will await cultures (?  Reactive versus septic left knee arthritis). 12/09/2022: Patient seen alongside patient's wife and nurse.  Left knee joint fluid has not grown any organisms to date, suspect reactive arthropathy.  Orthopedic input is appreciated.  Patient continues to be intermittently confused.  Confusion is likely multifactorial.  Significantly, patient has significant hypoglycemia on last admission (a few days ago).  Hopefully, patient can be discharged home tomorrow.  Patient's wife is willing to look  after patient for 24 hours.   Assessment and Plan: Principal Problem:   DKA (diabetic ketoacidosis) ([place]) Active Problems:   Gastroenteritis   Elevated troponin   AKI (acute kidney injury) ([place])  (please  populate well all problems here in Problem List. (For example, if patient is on BP meds at home and you resume or decide to hold them, it is a problem that needs to be her. Same for CAD, COPD, HLD and so on)     DKA: -Resolved Presented with DKA, was on insulin drip, currently discontinued 12/05/2022: Patient will need diabetic education.  Patient's family will also need diabetic education.  Blood sugar control is improving.  Patient will need to follow-up closely with primary care provider and endocrinologist on discharge.  Guarded long-term prognosis. 12/07/2022: Input from the diabetic educator is highly appreciated.  Patient will follow-up with an endocrinologist uncontrolled. 12/09/2022: Significantly controlled.  Continue current management.   Delirium/encephalopathy: -Improved significantly, but continues intermittently.   12/07/2022: Patient was given IM olanzapine last night.  Patient remains very sleepy. 12/09/2022: Stable.   NSTEMI -No chest pain but significant elevation of troponins 490> 581> 1000 with persistent ST changes depressions and T wave inversions on 2 3 aVF in the morning and additional ST depression and T inversion on V5 and V6 this afternoon.  Cardiology following, Echo ordered and pending -Continue heparin drip. -ASA, statin -Undecided on Cath timing due to kidney function -Resume lisinopril when kidney function stabilized. 12/06/2022: Cardiology team is managing.  For outpatient pharmacologic cardiac stress test.   AKI on CKD stage IIIa: -Likely prerenal from volume contraction and dehydration from repeated nauseous vomiting and diarrhea from putative gastroenteritis -Clinically appears to be dry, will give 1 more liter of bolus, and continue high rate of IV fluids. 12/05/2022: Suspect AKI secondary to combination of prerenal/ATN, in the setting of severe DKA. 12/06/2022: AKI has resolved significantly.  Patient may be close to his baseline. 12/07/2022: Renal  function is back to baseline.  Patient has baseline chronic kidney disease.  SIRS -Secondary to DKA and possibly also reactive to gastroenteritis -Chest x-ray no significant infiltrates, UA showed no pyuria and diarrhea likely part of the GI symptoms from gastroenteritis, hold off antibiotics. 12/05/2022: Sepsis physiology has resolved.    Probably acute gastroenteritis -Symptoms started Saturday-Sunday, most of the upper GI symptoms including nauseous vomiting have improved but still having diarrhea.  On physical exam, abdomen exam benign, no indication for further GI workup 12/05/2022: Resolved.  Left knee swelling and pain/likely reactive arthropathy of left knee: -No prior diagnosis of gouty arthritis. -Check uric acid and CRP. -Consult orthopedic team. -Rule out gouty arthritis of left knee versus septic knee. -Orthopedic team as advised x-ray of the left knee. 12/08/2022: See above documentation. 12/09/2022: Left knee joint fluid has not grown any admittance.     DVT prophylaxis: Heparin drip has been discontinued Code Status: Full code   Subjective:  -No chest pain. -No shortness of breath. -No fever or chills.   -Patient is s/p aspiration of left knee effusion.  Physical Exam: Vitals:   12/08/22 1913 12/08/22 2316 12/09/22 0325 12/09/22 0732  BP: 125/87 (!) 136/91 138/84 (!) 143/95  Pulse:  77 75 89  Resp:  '16 18 17  '$ Temp: 98.3 F (36.8 C) 98.4 F (36.9 C) 98.6 F (37 C) 97.8 F (36.6 C)  TempSrc: Oral Oral Oral Oral  SpO2:  100% 100% 100%  Weight:      Height:  General condition: Not in any distress.  Awake and alert. HEENT: Patient is pale. Neck: Supple. Lungs: Clear to auscultation. CVS: S1-S2. Abdomen: Soft and nontender. Neuro: Awake and alert.  Moves all extremities. Extremities: No leg edema.  Left knee swelling has resolved.   Data Reviewed:   Family Communication:  -Wife and daughter.  Time spent: 35 minutes.  Author: Bonnell Public, MD 12/09/2022 7:45 AM  For on call review [email or web address].

## 2022-12-10 DIAGNOSIS — E1010 Type 1 diabetes mellitus with ketoacidosis without coma: Secondary | ICD-10-CM | POA: Diagnosis not present

## 2022-12-10 DIAGNOSIS — R079 Chest pain, unspecified: Secondary | ICD-10-CM

## 2022-12-10 LAB — CBC WITH DIFFERENTIAL/PLATELET
Abs Immature Granulocytes: 0.05 10*3/uL (ref 0.00–0.07)
Basophils Absolute: 0 10*3/uL (ref 0.0–0.1)
Basophils Relative: 1 %
Eosinophils Absolute: 0.2 10*3/uL (ref 0.0–0.5)
Eosinophils Relative: 5 %
HCT: 35.3 % — ABNORMAL LOW (ref 39.0–52.0)
Hemoglobin: 12.6 g/dL — ABNORMAL LOW (ref 13.0–17.0)
Immature Granulocytes: 1 %
Lymphocytes Relative: 36 %
Lymphs Abs: 1.8 10*3/uL (ref 0.7–4.0)
MCH: 25.9 pg — ABNORMAL LOW (ref 26.0–34.0)
MCHC: 35.7 g/dL (ref 30.0–36.0)
MCV: 72.6 fL — ABNORMAL LOW (ref 80.0–100.0)
Monocytes Absolute: 0.8 10*3/uL (ref 0.1–1.0)
Monocytes Relative: 17 %
Neutro Abs: 2 10*3/uL (ref 1.7–7.7)
Neutrophils Relative %: 40 %
Platelets: 297 10*3/uL (ref 150–400)
RBC: 4.86 MIL/uL (ref 4.22–5.81)
RDW: 14.5 % (ref 11.5–15.5)
Smear Review: NORMAL
WBC: 4.9 10*3/uL (ref 4.0–10.5)
nRBC: 0 % (ref 0.0–0.2)

## 2022-12-10 LAB — RENAL FUNCTION PANEL
Albumin: 2.6 g/dL — ABNORMAL LOW (ref 3.5–5.0)
Anion gap: 8 (ref 5–15)
BUN: 20 mg/dL (ref 8–23)
CO2: 23 mmol/L (ref 22–32)
Calcium: 8.6 mg/dL — ABNORMAL LOW (ref 8.9–10.3)
Chloride: 108 mmol/L (ref 98–111)
Creatinine, Ser: 1.28 mg/dL — ABNORMAL HIGH (ref 0.61–1.24)
GFR, Estimated: 58 mL/min — ABNORMAL LOW (ref >60)
Glucose, Bld: 76 mg/dL (ref 70–99)
Phosphorus: 3.7 mg/dL (ref 2.5–4.6)
Potassium: 3 mmol/L — ABNORMAL LOW (ref 3.5–5.1)
Sodium: 139 mmol/L (ref 135–145)

## 2022-12-10 LAB — GLUCOSE, CAPILLARY
Glucose-Capillary: 120 mg/dL — ABNORMAL HIGH (ref 70–99)
Glucose-Capillary: 234 mg/dL — ABNORMAL HIGH (ref 70–99)

## 2022-12-10 LAB — MAGNESIUM: Magnesium: 1.7 mg/dL (ref 1.7–2.4)

## 2022-12-10 MED ORDER — LANTUS SOLOSTAR 100 UNIT/ML ~~LOC~~ SOPN: PEN_INJECTOR | SUBCUTANEOUS | 11 refills | Status: DC

## 2022-12-10 MED ORDER — NOVOLOG FLEXPEN 100 UNIT/ML ~~LOC~~ SOPN: 4.0000 [IU] | PEN_INJECTOR | Freq: Three times a day (TID) | SUBCUTANEOUS | 1 refills | Status: DC

## 2022-12-10 MED ORDER — INSULIN ASPART 100 UNIT/ML IJ SOLN: 0.0000 [IU] | Freq: Three times a day (TID) | INTRAMUSCULAR | 11 refills | Status: DC

## 2022-12-10 MED ORDER — POTASSIUM CHLORIDE CRYS ER 20 MEQ PO TBCR: 40.0000 meq | EXTENDED_RELEASE_TABLET | ORAL | Status: DC

## 2022-12-10 MED ORDER — POLYETHYLENE GLYCOL 3350 17 G PO PACK: 17.0000 g | PACK | Freq: Every day | ORAL | 0 refills | Status: DC

## 2022-12-10 MED ORDER — DOCUSATE SODIUM 100 MG PO CAPS: 100.0000 mg | ORAL_CAPSULE | Freq: Two times a day (BID) | ORAL | 0 refills | Status: DC

## 2022-12-10 MED ORDER — ORAL CARE MOUTH RINSE: 15.0000 mL | OROMUCOSAL | 0 refills | Status: AC | PRN

## 2022-12-10 MED ORDER — INSULIN ASPART 100 UNIT/ML IJ SOLN: 0.0000 [IU] | Freq: Every day | INTRAMUSCULAR | 11 refills | Status: DC

## 2022-12-10 MED ORDER — SENNOSIDES-DOCUSATE SODIUM 8.6-50 MG PO TABS: 1.0000 | ORAL_TABLET | Freq: Two times a day (BID) | ORAL | 0 refills | Status: AC

## 2022-12-10 NOTE — TOC Progression Note (Signed)
Transition of Care [facility]) - Progression Note    Patient Details  Name: Christopher [name] MRN: [id number] Date of Birth: 1946/01/29  [facility] CM/SW Contact  Levonne Lapping, RN Phone Number: 12/10/2022, 3:23 PM  Clinical Narrative:     CM met with Patient and his Wife at bedside.  Patient was sleeping-Patient will be dc'ing to home with Wife and Home Health.  Wife was presented with list of Providers for Home Health SN and PT .  Choice was made by Wife for [facility] to provide Home Health. CM made referral. No DME needed- Patient already owns the recommended rolling walker. Address, PCP confirmed.   No Additional TOC needs identified   Expected Discharge Plan: [place] Services Barriers to Discharge: Other (must enter comment) (Pending approval from [facility] to offer [place]- patient choice)  Expected Discharge Plan and Services   Discharge Planning Services: CM Consult Post Acute Care Choice: [place] arrangements for the past 2 months: Single Family Home Expected Discharge Date: 12/10/22               DME Arranged: N/A DME Agency: NA       HH Arranged: RN, PT [place] Agency: [place] Date [facility] Agency Contacted: 12/10/22 Time [place]: [id number] Representative spoke with at [place]: Voice Mail left for [employer] [facility] ([place]) Interventions [place]: No Food Insecurity (10/06/2022)  Housing: Low Risk  (10/06/2022)  Transportation Needs: No Transportation Needs (10/06/2022)  Utilities: Not At Risk (10/06/2022)  Depression (PHQ2-9): Low Risk  (04/03/2022)  Physical Activity: Sufficiently Active (05/10/2020)  Tobacco Use: Low Risk  (12/03/2022)    Readmission Risk Interventions     No data to display

## 2022-12-10 NOTE — Discharge Summary (Addendum)
Physician Discharge Summary  Patient ID: Christopher [name] MRN: [id number] DOB/AGE: 1946/04/28 76 y.o.  Admit date: 12/03/2022 Discharge date: 12/10/2022  Admission Diagnoses:  Discharge Diagnoses:  Principal Problem:   DKA (diabetic ketoacidosis) ([place]) Active Problems:   Gastroenteritis   Elevated troponin   AKI (acute kidney injury) ([place]) on CKD 3a Encephalopathy, metabolic. Reactive arthropathy left knee.   Discharged Condition: stable  Hospital Course: Patient is a 76 year old African-American male with type 1 diabetes mellitus, insulin resistance, hypertension, hyperlipidemia, chronic kidney disease stage II, GERD and glaucoma.  Patient has been admitted repeatedly for abnormal blood sugar related problems.  Patient was recently discharged from the hospital.  Patient was admitted with DKA.  DKA was managed as per protocol.  DKA has resolved.  Patient was noted to be confused during the hospital stay.  Confusion is likely secondary to metabolic encephalopathy.  Patient has had episodes of significantly low blood sugars (hypoglycemia).  In the hospital stay, patient also developed left knee swelling and pain.  Will consult is negative for septic arthritis.  No crystals seen on left knee aspirate.  Patient has been optimized.  Patient be discharged back home with PT and nursing to follow patient.  Blood sugar control will need to be optimized.  Patient will follow up with primary care provider, orthopedic surgery team and endocrinologist on discharge.  Patient will also follow-up with cardiologist on discharge.  DKA: -Resolved -Patient was seen by diabetic educator during the hospital stay. -Patient will follow-up with PCP and endocrinologist on discharge. -Monitor and manage blood sugar closely.    Delirium/encephalopathy, metabolic: -Improved significantly, but continues to be intermittently confused. -Blood sugar has not been controlled recently.   NSTEMI -No chest pain but  significant elevation of troponins 490> 581> 1000 with persistent ST changes depressions and T wave inversions on 2 3 aVF in the morning and additional ST depression and T inversion on V5 and V6 this afternoon.  Cardiology following, Echo ordered and pending -Continue heparin drip. -ASA, statin -Undecided on Cath timing due to kidney function -Follow-up with cardiologist on discharge for outpatient pharmacologic cardiac stress test.   AKI on CKD stage IIIa: -AKI is likely prerenal from volume contraction and dehydration from repeated nauseous vomiting and diarrhea from putative gastroenteritis -AKI has resolved.  Renal function is back to baseline.   SIRS -Secondary to DKA and possibly also reactive to gastroenteritis -Chest x-ray no significant infiltrates, UA showed no pyuria and diarrhea likely part of the GI symptoms from gastroenteritis, hold off antibiotics.     Probably acute gastroenteritis -Symptoms started Saturday-Sunday, most of the upper GI symptoms including nauseous vomiting have improved but still having diarrhea.  On physical exam, abdomen exam benign, no indication for further GI workup 12/05/2022: Resolved.   Left knee swelling and pain/likely reactive arthropathy of left knee: -No prior diagnosis of gouty arthritis. -Orthopedic team was consulted. -Left knee was aspirated and 80 cc taken out. -No bacteria seen. -No organisms are today. -Significant WBC.    Consults: orthopedic surgery for left knee swelling and pain.  Left knee effusion was aspirated and 80 mL taken.  Analysis is negative for bacteria, cultures have not grown any organisms, but revealed significant WBC.  Discharge Exam: Blood pressure 120/77, pulse 71, temperature (!) 97.4 F (36.3 C), temperature source Oral, resp. rate 14, height '6\' 3"'$  (1.905 m), weight 93.4 kg, SpO2 99 %.   Disposition: Discharge disposition: 06-Home-Health Care Svc       Discharge Instructions  Diet - low sodium  heart healthy   Complete by: As directed    Increase activity slowly   Complete by: As directed       Allergies as of 12/10/2022   No Known Allergies      Medication List     STOP taking these medications    atorvastatin 80 MG tablet Commonly known as: LIPITOR   potassium chloride 10 MEQ tablet Commonly known as: KLOR-CON       TAKE these medications    aspirin 81 MG chewable tablet Commonly known as: Aspirin Childrens Chew 1 tablet (81 mg total) by mouth daily.   BD Veo Insulin Syringe U/F 31G X 15/64" 1 ML Misc Generic drug: Insulin Syringe-Needle U-100 USE  SYRINGE TWICE DAILY   brimonidine 0.15 % ophthalmic solution Commonly known as: ALPHAGAN Place 1 drop into both eyes 2 (two) times daily.   Dexcom G6 Transmitter Misc Use every 3 months   docusate sodium 100 MG capsule Commonly known as: COLACE Take 1 capsule (100 mg total) by mouth 2 (two) times daily.   dorzolamide-timolol 2-0.5 % ophthalmic solution Commonly known as: COSOPT Place 1 drop into both eyes 2 (two) times daily.   FreeStyle Libre 2 Reader [place] 1 each by Does not apply route daily.   Dexcom G7 Receiver Devi Use to check blood sugar   FreeStyle Libre 2 Sensor Misc 1 Device by Does not apply route every 14 (fourteen) days.   Dexcom G6 Sensor Misc Change every 10 days   insulin aspart 100 UNIT/ML injection Commonly known as: novoLOG Inject 0-20 Units into the skin 3 (three) times daily with meals. What changed: You were already taking a medication with the same name, and this prescription was added. Make sure you understand how and when to take each.   insulin aspart 100 UNIT/ML injection Commonly known as: novoLOG Inject 0-5 Units into the skin at bedtime. What changed: You were already taking a medication with the same name, and this prescription was added. Make sure you understand how and when to take each.   NovoLOG FlexPen 100 UNIT/ML FlexPen Generic drug: insulin  aspart Inject 4 Units into the skin 3 (three) times daily with meals. What changed: how much to take   Insulin Pen Needle 32G X 4 MM Misc Use as instructed to administer insulin 4-5X daily. E10.65   Lantus SoloStar 100 UNIT/ML Solostar Pen Generic drug: insulin glargine 30 units in the morning, 10 units in the evening What changed:  how much to take how to take this when to take this additional instructions   lisinopril 5 MG tablet Commonly known as: ZESTRIL Take 1 tablet (5 mg total) by mouth daily.   mouth rinse Liqd solution 15 mLs by Mouth Rinse route as needed (for oral care).   Multivitamin Adults 50+ Tabs Take 1 tablet by mouth daily.   OneTouch Verio test strip Generic drug: glucose blood 1 each by Other route 2 (two) times daily. And lancets 2/day   polyethylene glycol 17 g packet Commonly known as: MIRALAX / GLYCOLAX Take 17 g by mouth daily. Start taking on: December 11, 2022   senna-docusate 8.6-50 MG tablet Commonly known as: Senokot-S Take 1 tablet by mouth 2 (two) times daily.               Durable Medical Equipment  (From admission, onward)           Start     Ordered   12/10/22 1508  DME Walker  Once       Question Answer Comment  Walker: With [place] Wheels   Patient needs a walker to treat with the following condition DKA, type 1 ([place])      12/10/22 1507             Signed: Bonnell Public 12/10/2022, 3:08 PM  Patient was seen by diabetic educator during the hospital stay. -Patient will follow-up with PCP and endocrinologist on discharge. -Patient

## 2022-12-10 NOTE — Progress Notes (Signed)
Cardiology Note:   Please see full cardiology rounding note from 12/09/22.  We will make sure he has outpatient follow up and stress arranged.   Lauree Chandler, MD, [facility] 12/10/2022 9:25 AM

## 2022-12-10 NOTE — Progress Notes (Signed)
Physical Therapy Treatment Patient Details Name: Christopher [name] MRN: [id number] DOB: 12/20/1945 Today's Date: 12/10/2022   History of Present Illness Pt is a 76 y.o. male admitted 12/03/22 with confusion and hyperglycemia. Workup for DKA, SIRS. Pt with L knee pain, s/p aspiration and injection 12/23. Of note, recent admission 12/03/2022 with AMS, DKA. PMH includes DM2, HTN, HLD, CKD2, glaucoma.    PT Comments    Pt received in bed, dosing after a visit with his granddaughter. He remembered her visiting this AM but does not recall the events of yesterday. However, once up and ambulating was able to remember some details of yesterday's football game which is an improvement. All mvmts continue to be slow and deliberate with cues needed for posture and sequencing. Pt ambulated 100' with RW and min guard A. Has L foot swelling as well as pocket of fluid medial L knee. Continue to recommend home with HHPT. PT will continue to follow.    Recommendations for follow up therapy are one component of a multi-disciplinary discharge planning process, led by the attending physician.  Recommendations may be updated based on patient status, additional functional criteria and insurance authorization.  Follow Up Recommendations  Home health PT     Assistance Recommended at Discharge Frequent or constant Supervision/Assistance  Patient can return home with the following A little help with walking and/or transfers;A little help with bathing/dressing/bathroom;Assistance with cooking/housework;Direct supervision/assist for medications management;Direct supervision/assist for financial management;Assist for transportation;Help with stairs or ramp for entrance   Equipment Recommendations  Rolling walker (2 wheels)    Recommendations for Other Services       Precautions / Restrictions Precautions Precautions: Fall Restrictions Weight Bearing Restrictions: Yes LLE Weight Bearing: Weight bearing as tolerated      Mobility  Bed Mobility Overal bed mobility: Modified Independent             General bed mobility comments: increased time    Transfers Overall transfer level: Needs assistance Equipment used: Rolling walker (2 wheels) Transfers: Sit to/from Stand Sit to Stand: Min guard           General transfer comment: very slow standing but able to rise on first try and maintain balance. vc's for erect posture    Ambulation/Gait Ambulation/Gait assistance: Min guard Gait Distance (Feet): 100 Feet Assistive device: Rolling walker (2 wheels) Gait Pattern/deviations: Step-to pattern, Step-through pattern, Decreased stride length, Trunk flexed, Antalgic, Decreased weight shift to left Gait velocity: Decreased     General Gait Details: slow, antalgic gait with RW and min guard for balance; pt requiring cues for upright posture, increased step length and closer proximity to [employer]    Modified Rankin (Stroke Patients Only)       Balance Overall balance assessment: Needs assistance Sitting-balance support: Feet supported Sitting balance-Leahy Scale: Good     Standing balance support: No upper extremity supported, Reliant on assistive device for balance Standing balance-Leahy Scale: Poor                              Cognition Arousal/Alertness: Awake/alert Behavior During Therapy: WFL for tasks assessed/performed Overall Cognitive Status: Impaired/Different from baseline Area of Impairment: Orientation, Attention, Memory, Following commands, Safety/judgement, Awareness, Problem solving                 Orientation Level: Disoriented to, Time, Situation Current Attention  Level: Selective Memory: Decreased short-term memory Following Commands: Follows one step commands with increased time, Follows multi-step commands with increased time Safety/Judgement: Decreased awareness of safety, Decreased awareness of  deficits Awareness: Emergent Problem Solving: Slow processing, Difficulty sequencing, Requires verbal cues General Comments: pt oriented to self and place, continues to have STM deficits.        Exercises General Exercises - Lower Extremity Long Arc Quad: AROM, Left, 10 reps, Seated Heel Slides: AROM, Left, 10 reps, Supine    General Comments General comments (skin integrity, edema, etc.): L foot with noted swelling, had difficulty getting shoe on and had to take that sock off. Pocket of fluid noted medial L knee.      Pertinent Vitals/Pain Pain Assessment Pain Assessment: Faces Faces Pain Scale: Hurts little more Pain Location: L knee Pain Descriptors / Indicators: Discomfort, Sore Pain Intervention(s): Limited activity within patient's tolerance, Monitored during session    Home Living                          Prior Function            PT Goals (current goals can now be found in the care plan section) Acute Rehab PT Goals Patient Stated Goal: return home PT Goal Formulation: With patient/family Time For Goal Achievement: 12/23/22 Potential to Achieve Goals: Good Progress towards PT goals: Progressing toward goals    Frequency    Min 3X/week      PT Plan Current plan remains appropriate    Co-evaluation              AM-PAC PT "6 Clicks" Mobility   Outcome Measure  Help needed turning from your back to your side while in a flat bed without using bedrails?: A Little Help needed moving from lying on your back to sitting on the side of a flat bed without using bedrails?: A Little Help needed moving to and from a bed to a chair (including a wheelchair)?: A Little Help needed standing up from a chair using your arms (e.g., wheelchair or bedside chair)?: A Little Help needed to walk in hospital room?: A Little Help needed climbing 3-5 steps with a railing? : A Little 6 Click Score: 18    End of Session Equipment Utilized During Treatment: Gait  belt Activity Tolerance: Patient tolerated treatment well Patient left: with call bell/phone within reach;in bed;with bed alarm set Nurse Communication: Mobility status PT Visit Diagnosis: Other abnormalities of gait and mobility (R26.89);Pain Pain - Right/Left: Left Pain - part of body: Knee     Time: 0902-0930 PT Time Calculation (min) (ACUTE ONLY): 28 min  Charges:  $Gait Training: 23-37 mins                     [place] chat preferred Office [place] 12/10/2022, 10:56 AM

## 2022-12-11 ENCOUNTER — Other Ambulatory Visit: Payer: Self-pay | Admitting: *Deleted

## 2022-12-11 DIAGNOSIS — R7989 Other specified abnormal findings of blood chemistry: Secondary | ICD-10-CM

## 2022-12-11 LAB — BODY FLUID CULTURE W GRAM STAIN: Culture: NO GROWTH

## 2022-12-11 MED ORDER — ONETOUCH ULTRASOFT LANCETS MISC: 12 refills | Status: DC

## 2022-12-11 NOTE — Progress Notes (Signed)
Lexiscan and attestation orders

## 2022-12-11 NOTE — Telephone Encounter (Signed)
Lancets sent to pharmacy  °

## 2022-12-12 ENCOUNTER — Encounter: Payer: Self-pay | Admitting: Endocrinology

## 2022-12-12 ENCOUNTER — Ambulatory Visit: Payer: Medicare HMO | Admitting: Endocrinology

## 2022-12-12 ENCOUNTER — Telehealth (HOSPITAL_COMMUNITY): Payer: Self-pay | Admitting: *Deleted

## 2022-12-12 VITALS — Ht 74.0 in | Wt 201.0 lb

## 2022-12-12 DIAGNOSIS — Z8639 Personal history of other endocrine, nutritional and metabolic disease: Secondary | ICD-10-CM

## 2022-12-12 DIAGNOSIS — E1065 Type 1 diabetes mellitus with hyperglycemia: Secondary | ICD-10-CM | POA: Diagnosis not present

## 2022-12-12 MED ORDER — INSULIN ASPART 100 UNIT/ML IJ SOLN: INTRAMUSCULAR | 2 refills | Status: DC

## 2022-12-12 NOTE — Progress Notes (Signed)
Patient ID: Christopher [name], male   DOB: 1946-02-15, 76 y.o.   MRN: [id number]    Chief complaint : Follow up of Type 1 Diabetes  History of Present Illness:          Date of diagnosis: 2010  Prior history:  Previous A1c range has been 8.4-11.6   Therapy: insulin since 2013.   DKA: Twice (2023) Severe hypoglycemia: last episode was in 11/2022.  Non-insulin hypoglycemic drugs: Previously had been on Trulicity and Victoza which were stopped Previous insulins tried include Levemir and NPH  INSULIN regimen : Lantus 38--20 units bid Novolog FlexPen before meals: 10 units before meals  Recent history:     A1c is at 8.9 last  He is generally getting help from his wife to dose his insulin Recent ER visit and admission were reviewed in detail On 12/15 he had severe hypoglycemia with neurological symptoms but not clear why Previously his blood sugar had been extremely labile with no consistent pattern  After his hypoglycemia episode apparently did not take his Lantus until blood sugar started going up but this caused him to be in DKA  On discharge on 12/26 his blood sugars were within the target range and not clear if he took the full dose of Lantus that day since the Blood sugars were 1 mostly in the low 200 range His wife is adjusting his NOVOLOG only based on Premeal blood sugar and not carbohydrates as discussed before Yesterday evening with eating sauted shrimp and baked sweet potato he took his 10 units of NovoLog but blood sugar started getting low through the night for several hours Today blood sugar is still on the lower side of the target range At lunchtime he had crackers and tuna salad without any insulin coverage and blood sugar is 130 in the office this afternoon He d appears to be taking his NovoLog before eating most of the time He was also told to switch to [place] but he has not finished his supply of Lantus He has apparently been approved for the OmniPod 5 pump but  waiting for the sensors Sensor data not available for the last 2 weeks except for the last 2 days as above  Glucose patterns from CGM on his last visit: Overall blood sugars show high variability at all times with no pattern:  On some days he has marked hyperglycemia above 350 generally midday and afternoon and once continuing into the night the first week of the data his blood sugars were generally lower and he had hypoglycemia either overnight or afternoon Generally OVERNIGHT blood sugars are highly variable but usually mostly high especially late at night and then decreasing to variable levels including low sugars early morning POSTPRANDIAL readings appear to go up significantly after breakfast but not consistent amounts Also blood sugars may be higher on some afternoons Blood sugars are not usually assessed after dinner but blood sugar at midnight is mostly above target levels Hypoglycemia seen on 3 occasions either overnight or mid afternoon   CGM use % of time 55  2-week average/GV 193/52  Time in range       45%  % Time Above 180 24  % Time above 250 23  % Time Below 70 8     Symptoms of hypoglycemia: Weakness, shakiness        Self-care: The diet that the patient has been following is: Relatively low fat and carbohydrate May have sugar-free cookies at bedtime Breakfast may be oatmeal with almond  milk or eggs and sausage         Exercise: No consistent activity.          Most recent dietitian/nurse educator visit :    04/2022       Diabetes labs:  Lab Results  Component Value Date   HGBA1C 8.9 (H) 12/05/2022   HGBA1C 10.1 (H) 08/12/2022   HGBA1C 9.5 (H) 05/20/2022   Lab Results  Component Value Date   MICROALBUR <0.7 05/20/2022   LDLCALC 88 05/20/2022   CREATININE 1.28 (H) 12/10/2022    No results displayed because visit has over 200 results.      Allergies as of 12/12/2022   No Known Allergies      Medication List        Accurate as of December 12, 2022  5:01 PM. If you have any questions, ask your nurse or doctor.          aspirin 81 MG chewable tablet Commonly known as: Aspirin Childrens Chew 1 tablet (81 mg total) by mouth daily.   BD Veo Insulin Syringe U/F 31G X 15/64" 1 ML Misc Generic drug: Insulin Syringe-Needle U-100 USE  SYRINGE TWICE DAILY   brimonidine 0.15 % ophthalmic solution Commonly known as: ALPHAGAN Place 1 drop into both eyes 2 (two) times daily.   Dexcom G6 Transmitter Misc Use every 3 months   docusate sodium 100 MG capsule Commonly known as: COLACE Take 1 capsule (100 mg total) by mouth 2 (two) times daily.   dorzolamide-timolol 2-0.5 % ophthalmic solution Commonly known as: COSOPT Place 1 drop into both eyes 2 (two) times daily.   FreeStyle Libre 2 Reader [place] 1 each by Does not apply route daily.   Dexcom G7 Receiver Devi Use to check blood sugar   FreeStyle Libre 2 Sensor Misc 1 Device by Does not apply route every 14 (fourteen) days.   Dexcom G6 Sensor Misc Change every 10 days   insulin aspart 100 UNIT/ML injection Commonly known as: novoLOG Inject 0-20 Units into the skin 3 (three) times daily with meals. What changed: Another medication with the same name was removed. Continue taking this medication, and follow the directions you see here. Changed by: Elayne Snare, MD   NovoLOG FlexPen 100 UNIT/ML FlexPen Generic drug: insulin aspart Inject 4 Units into the skin 3 (three) times daily with meals. What changed: Another medication with the same name was removed. Continue taking this medication, and follow the directions you see here. Changed by: Elayne Snare, MD   Insulin Pen Needle 32G X 4 MM Misc Use as instructed to administer insulin 4-5X daily. E10.65   Lantus SoloStar 100 UNIT/ML Solostar Pen Generic drug: insulin glargine 30 units in the morning, 10 units in the evening   lisinopril 5 MG tablet Commonly known as: ZESTRIL Take 1 tablet (5 mg total) by mouth daily.    mouth rinse Liqd solution 15 mLs by Mouth Rinse route as needed (for oral care).   Multivitamin Adults 50+ Tabs Take 1 tablet by mouth daily.   Omnipod 5 G6 Pod (Gen 5) Misc SMARTSIG:SUB-Q Every 3 Days   Omnipod 5 G6 Intro (Gen 5) Kit Inject into the skin.   onetouch ultrasoft lancets Use to check blood sugar 3 times a day   OneTouch Verio test strip Generic drug: glucose blood 1 each by Other route 2 (two) times daily. And lancets 2/day   polyethylene glycol 17 g packet Commonly known as: MIRALAX / GLYCOLAX Take 17 g by  mouth daily.   senna-docusate 8.6-50 MG tablet Commonly known as: Senokot-S Take 1 tablet by mouth 2 (two) times daily.        Allergies: No Known Allergies  Past Medical History:  Diagnosis Date   Diabetes mellitus type II    GERD (gastroesophageal reflux disease)    Glaucoma    Hyperlipidemia    Hypertension     Past Surgical History:  Procedure Laterality Date   COLONOSCOPY     HERNIA REPAIR     PATELLA FRACTURE SURGERY  1993    Family History  Problem Relation Age of Onset   Alcohol abuse Father    Diabetes Sister    Colon cancer Neg Hx    Esophageal cancer Neg Hx    Stomach cancer Neg Hx    Rectal cancer Neg Hx     Social History:  reports that he has never smoked. He has never used smokeless tobacco. He reports that he does not drink alcohol and does not use drugs.    Review of Systems:   Blood pressure: Usually controlled, only on 5 mg lisinopril  BP Readings from Last 3 Encounters:  12/10/22 120/77  11/29/22 (!) 141/96  11/01/22 (!) 149/88     Lipids: He has been treated with 80 mg Lipitor prescribed by other physicians  Lab Results  Component Value Date   CHOL 153 05/20/2022   HDL 53.80 05/20/2022   LDLCALC 88 05/20/2022   LDLDIRECT 135.3 06/17/2013   TRIG 57.0 05/20/2022   CHOLHDL 3 05/20/2022     Diabetes complications: None, no microalbuminuria Last eye exam 2/23 Last foot exam 10/22    Physical  Examination:  Ht _0  (1.88 m)   Wt 201 lb (91.2 kg)   BMI 25.81 kg/m         ASSESSMENT/PLAN   Diabetes type 1 with persistently poor control   A1c consistently over 8%  Currently on twice a day Lantus and mealtime NovoLog  Problems identified: Inadequate CGM data recently partly because of hospitalization Tendency to hypoglycemia and hypoglycemia unawareness Poor understanding on actions of basal and bolus insulin  Likely inconsistent action of Lantus insulin  He is likely is getting excessive basal insulin because of low sugars during the night last night      Recommendations:  LANTUS insulin: Take 38 units at breakfast as before but reduce evening dose to 14 He will reduce his NovoLog coverage to 6 to 8 units Discussed that this should be only to cover his food intake For larger meals or more than 1 serving of carbohydrate he will take 8 units at least otherwise 6 units For small meals like snacks with crackers he will take only correction doses He will take correction doses of NovoLog for blood sugars over 150 based on the 1:50 ratio  This was explained to him and his wife in detail today and written instructions given Referral made for starting insulin pump when all supplies are available  Office visit time 30 minutes with 15 to 20 minutes in counseling   Patient Instructions  Lantus 14 in pm, keep 38 in am  Novolog 6-8 units with meals   Add extra 1 unit for every 50 points over 150                      Lugenia Assefa 12/12/2022, 5:01 PM

## 2022-12-12 NOTE — Telephone Encounter (Signed)
Patient given detailed instructions per Myocardial Perfusion Study Information Sheet for the test on 12/17/2022 at 10:30. Patient notified to arrive 15 minutes early and that it is imperative to arrive on time for appointment to keep from having the test rescheduled.  If you need to cancel or reschedule your appointment, please call the office within 24 hours of your appointment. . Patient verbalized understanding.Christopher [name]

## 2022-12-12 NOTE — Telephone Encounter (Signed)
Noted!   Can you please call pt and schedule a new pt appt?

## 2022-12-12 NOTE — Patient Instructions (Addendum)
Lantus 14 in pm, keep 38 in am  Novolog 6-8 units with meals   Add extra 1 unit for every 50 points over 150

## 2022-12-17 ENCOUNTER — Ambulatory Visit (HOSPITAL_COMMUNITY): Payer: Medicare HMO | Attending: Internal Medicine

## 2022-12-17 DIAGNOSIS — R7989 Other specified abnormal findings of blood chemistry: Secondary | ICD-10-CM | POA: Insufficient documentation

## 2022-12-17 DIAGNOSIS — I517 Cardiomegaly: Secondary | ICD-10-CM | POA: Insufficient documentation

## 2022-12-17 LAB — MYOCARDIAL PERFUSION IMAGING
LV dias vol: 106 mL (ref 62–150)
LV sys vol: 53 mL
Nuc Stress EF: 50 %
Peak HR: 88 {beats}/min
Rest HR: 72 {beats}/min
Rest Nuclear Isotope Dose: 9.7 mCi
SDS: 1
SRS: 0
SSS: 1
ST Depression (mm): 0 mm
Stress Nuclear Isotope Dose: 31.5 mCi
TID: 0.91

## 2022-12-17 MED ORDER — REGADENOSON 0.4 MG/5ML IV SOLN
0.4000 mg | Freq: Once | INTRAVENOUS | Status: AC
  Administered 2022-12-17: 0.4 mg via INTRAVENOUS

## 2022-12-17 MED ORDER — TECHNETIUM TC 99M TETROFOSMIN IV KIT
9.7000 | PACK | Freq: Once | INTRAVENOUS | Status: AC | PRN
  Administered 2022-12-17: 9.7 via INTRAVENOUS

## 2022-12-17 MED ORDER — TECHNETIUM TC 99M TETROFOSMIN IV KIT
31.5000 | PACK | Freq: Once | INTRAVENOUS | Status: AC | PRN
  Administered 2022-12-17: 31.5 via INTRAVENOUS

## 2022-12-18 ENCOUNTER — Telehealth: Payer: Self-pay

## 2022-12-18 DIAGNOSIS — N1831 Chronic kidney disease, stage 3a: Secondary | ICD-10-CM | POA: Diagnosis not present

## 2022-12-18 DIAGNOSIS — I214 Non-ST elevation (NSTEMI) myocardial infarction: Secondary | ICD-10-CM | POA: Diagnosis not present

## 2022-12-18 DIAGNOSIS — E785 Hyperlipidemia, unspecified: Secondary | ICD-10-CM | POA: Diagnosis not present

## 2022-12-18 DIAGNOSIS — K219 Gastro-esophageal reflux disease without esophagitis: Secondary | ICD-10-CM | POA: Diagnosis not present

## 2022-12-18 DIAGNOSIS — N179 Acute kidney failure, unspecified: Secondary | ICD-10-CM | POA: Diagnosis not present

## 2022-12-18 DIAGNOSIS — I129 Hypertensive chronic kidney disease with stage 1 through stage 4 chronic kidney disease, or unspecified chronic kidney disease: Secondary | ICD-10-CM | POA: Diagnosis not present

## 2022-12-18 DIAGNOSIS — K529 Noninfective gastroenteritis and colitis, unspecified: Secondary | ICD-10-CM | POA: Diagnosis not present

## 2022-12-18 DIAGNOSIS — H409 Unspecified glaucoma: Secondary | ICD-10-CM | POA: Diagnosis not present

## 2022-12-18 DIAGNOSIS — E1322 Other specified diabetes mellitus with diabetic chronic kidney disease: Secondary | ICD-10-CM | POA: Diagnosis not present

## 2022-12-18 NOTE — Telephone Encounter (Signed)
April from [facility] home nurse is trying to get verbal orders to ok seeing patient twice a week for 1 week and once a week for 4 weeks. Please advise.

## 2022-12-19 ENCOUNTER — Telehealth: Payer: Self-pay | Admitting: Dietician

## 2022-12-19 NOTE — Telephone Encounter (Signed)
Called and spoke to patient's wife.   Patient has a Dexcom G6 and Omnipod 5 and is ready for training. Made an appointment with Vaughan Basta for [place] training next week. She can make the Omnipod 5 training appointment after this session.  Antonieta Iba, RD, LDN, CDCES

## 2022-12-19 NOTE — Progress Notes (Signed)
Office Visit    Patient Name: Christopher [name] Date of Encounter: 12/20/2022  PCP:  Isaac Bliss, Rayford Halsted, MD   [place]  Cardiologist:  Larae Grooms, MD  Advanced Practice Provider:  No care team member to display Electrophysiologist:  None   HPI    Christopher [name] is a 77 y.o. male with type 1 diabetes mellitus, insulin resistance, hypertension, hyperlipidemia, chronic kidney disease stage II, GERD, anxiety, presents today for hospital follow-up.  Patient was admitted repeated times for abnormal blood sugar related problems.  Patient was recently discharged from the hospital and admitted with DKA.  DKA was managed as per protocol.  This was resolved.  Patient noted confusion during that hospital stay.  Confusion was likely secondary to metabolic encephalopathy.  Patient had episodes of significantly low blood sugars (hypoglycemia).  In the hospital, patient developed left knee swelling and pain.  The patient did not have any chest pain but did have significantly elevated troponins 490, 581, and thousand with persistent ST changes, depressions, and T wave inversions on 2 3 aVF and additional ST depressions and T inversions on V5 and V6.  Continue heparin drip and was started on ASA and statin. Undecided on timing due to kidney function.  Recommended follow-up outpatient for cardiac stress test.  Today, he tells me that he never had chest pain or sob. He did have a concerning EKG and troponins were ordered which were elevated.  His biggest issue has been his blood glucose level. He is working on transitioning from an insulin pen to a pump.  His blood glucose this morning was close to 400. His lipitor was discontinued in the hospital and he is not sure why. He can stay off his lisinopril for now since his bp is well controled today. Stress test reviewed with the patient today and questions answered.   Reports no shortness of breath nor dyspnea on exertion.  Reports no chest pain, pressure, or tightness. No edema, orthopnea, PND. Reports no palpitations.   Past Medical History    Past Medical History:  Diagnosis Date   Diabetes mellitus type II    GERD (gastroesophageal reflux disease)    Glaucoma    Hyperlipidemia    Hypertension    Past Surgical History:  Procedure Laterality Date   COLONOSCOPY     HERNIA REPAIR     PATELLA FRACTURE SURGERY  1993    Allergies  No Known Allergies   EKGs/Labs/Other Studies Reviewed:   The following studies were reviewed today:  Lexiscan Myoview 12/17/2022   Findings are consistent with no prior ischemia and no prior myocardial infarction. The study is low risk.   No ST deviation was noted.   Left ventricular function is abnormal. Global function is mildly reduced. Nuclear stress EF: 50 %. The left ventricular ejection fraction is mildly decreased (45-54%). End diastolic cavity size is mildly enlarged. End systolic cavity size is normal.   Prior study not available for comparison.   Negative stress test. Low normal ejection fraction.  EKG:  EKG is not ordered today.    Recent Labs: 11/29/2022: ALT 16 12/10/2022: BUN 20; Creatinine, Ser 1.28; Hemoglobin 12.6; Magnesium 1.7; Platelets 297; Potassium 3.0; Sodium 139  Recent Lipid Panel    Component Value Date/Time   CHOL 153 05/20/2022 1012   TRIG 57.0 05/20/2022 1012   HDL 53.80 05/20/2022 1012   CHOLHDL 3 05/20/2022 1012   VLDL 11.4 05/20/2022 1012   LDLCALC 88 05/20/2022 1012  LDLDIRECT 135.3 06/17/2013 0838    Home Medications   Current Meds  Medication Sig   aspirin (ASPIRIN CHILDRENS) 81 MG chewable tablet Chew 1 tablet (81 mg total) by mouth daily.   brimonidine (ALPHAGAN) 0.15 % ophthalmic solution Place 1 drop into both eyes 2 (two) times daily.   Continuous Blood Gluc Receiver (DEXCOM G7 RECEIVER) DEVI Use to check blood sugar   Continuous Blood Gluc Receiver (FREESTYLE LIBRE 2 READER) DEVI 1 each by Does not apply route  daily.   Continuous Blood Gluc Sensor (DEXCOM G6 SENSOR) MISC Change every 10 days   Continuous Blood Gluc Sensor (FREESTYLE LIBRE 2 SENSOR) MISC 1 Device by Does not apply route every 14 (fourteen) days.   Continuous Blood Gluc Transmit (DEXCOM G6 TRANSMITTER) MISC Use every 3 months   docusate sodium (COLACE) 100 MG capsule Take 1 capsule (100 mg total) by mouth 2 (two) times daily. (Patient taking differently: Take 100 mg by mouth as needed.)   dorzolamide-timolol (COSOPT) 22.3-6.8 MG/ML ophthalmic solution Place 1 drop into both eyes 2 (two) times daily.    glucose blood (ONETOUCH VERIO) test strip 1 each by Other route 2 (two) times daily. And lancets 2/day   insulin aspart (NOVOLOG FLEXPEN) 100 UNIT/ML FlexPen Inject 4 Units into the skin 3 (three) times daily with meals.   insulin aspart (NOVOLOG) 100 UNIT/ML injection Up to 50 units daily in insulin pump   Insulin Disposable Pump (OMNIPOD 5 G6 INTRO, GEN 5,) KIT Inject into the skin.   Insulin Disposable Pump (OMNIPOD 5 G6 POD, GEN 5,) MISC SMARTSIG:SUB-Q Every 3 Days   insulin glargine (LANTUS SOLOSTAR) 100 UNIT/ML Solostar Pen 30 units in the morning, 10 units in the evening (Patient taking differently: 38 units in the morning, 14 units in the evening)   Insulin Pen Needle 32G X 4 MM MISC Use as instructed to administer insulin 4-5X daily. E10.65   Insulin Syringe-Needle U-100 (BD VEO INSULIN SYRINGE U/F) 31G X 15/64" 1 ML MISC USE  SYRINGE TWICE DAILY   Lancets (ONETOUCH ULTRASOFT) lancets Use to check blood sugar 3 times a day   lisinopril (ZESTRIL) 5 MG tablet Take 1 tablet (5 mg total) by mouth daily.   Multiple Vitamins-Minerals (MULTIVITAMIN ADULTS 50+) TABS Take 1 tablet by mouth daily.   polyethylene glycol (MIRALAX / GLYCOLAX) 17 g packet Take 17 g by mouth daily. (Patient taking differently: Take 17 g by mouth as needed.)   senna-docusate (SENOKOT-S) 8.6-50 MG tablet Take 1 tablet by mouth 2 (two) times daily. (Patient taking  differently: Take by mouth as needed.)     Review of Systems      All other systems reviewed and are otherwise negative except as noted above.  Physical Exam    VS:  BP 110/70   Pulse 74   Ht '6\' 3"'$  (1.905 m)   Wt 206 lb 9.6 oz (93.7 kg)   SpO2 99%   BMI 25.82 kg/m  , BMI Body mass index is 25.82 kg/m.  Wt Readings from Last 3 Encounters:  12/20/22 206 lb 9.6 oz (93.7 kg)  12/17/22 206 lb (93.4 kg)  12/12/22 201 lb (91.2 kg)     GEN: Well nourished, well developed, in no acute distress. HEENT: normal. Neck: Supple, no JVD, carotid bruits, or masses. Cardiac: RRR, no murmurs, rubs, or gallops. No clubbing, cyanosis, edema.  Radials/PT 2+ and equal bilaterally.  Respiratory:  Respirations regular and unlabored, clear to auscultation bilaterally. GI: Soft, nontender, nondistended. MS: No  deformity or atrophy. Skin: Warm and dry, no rash. Neuro:  Strength and sensation are intact. Psych: Normal affect.  Assessment & Plan    Positive troponin with negative stress test -he never had chest pain or SOB -it was determined his symptoms were from DKA -continue Lipitor '80mg'$  daily -no further testing indicated at this time  AKI on CKD stage IIIa -lisinopril has been held, no need to restart since BP is well controlled  HTN -well controlled today -continue to monitor at home  HLD -restart statin and plan for repeat lipid panel and LFTs 05/2023       Disposition: Follow up 3 months with Larae Grooms, MD or APP.  Signed, Elgie Collard, PA-C 12/20/2022, 11:25 AM [place]

## 2022-12-20 ENCOUNTER — Encounter: Payer: Self-pay | Admitting: Physician Assistant

## 2022-12-20 ENCOUNTER — Ambulatory Visit: Payer: Medicare HMO | Attending: Physician Assistant | Admitting: Physician Assistant

## 2022-12-20 VITALS — BP 110/70 | HR 74 | Ht 75.0 in | Wt 206.6 lb

## 2022-12-20 DIAGNOSIS — I10 Essential (primary) hypertension: Secondary | ICD-10-CM | POA: Diagnosis not present

## 2022-12-20 DIAGNOSIS — K529 Noninfective gastroenteritis and colitis, unspecified: Secondary | ICD-10-CM | POA: Diagnosis not present

## 2022-12-20 DIAGNOSIS — R079 Chest pain, unspecified: Secondary | ICD-10-CM | POA: Diagnosis not present

## 2022-12-20 DIAGNOSIS — I214 Non-ST elevation (NSTEMI) myocardial infarction: Secondary | ICD-10-CM | POA: Diagnosis not present

## 2022-12-20 DIAGNOSIS — N179 Acute kidney failure, unspecified: Secondary | ICD-10-CM

## 2022-12-20 DIAGNOSIS — E1322 Other specified diabetes mellitus with diabetic chronic kidney disease: Secondary | ICD-10-CM | POA: Diagnosis not present

## 2022-12-20 DIAGNOSIS — N1831 Chronic kidney disease, stage 3a: Secondary | ICD-10-CM | POA: Diagnosis not present

## 2022-12-20 DIAGNOSIS — I129 Hypertensive chronic kidney disease with stage 1 through stage 4 chronic kidney disease, or unspecified chronic kidney disease: Secondary | ICD-10-CM | POA: Diagnosis not present

## 2022-12-20 DIAGNOSIS — K219 Gastro-esophageal reflux disease without esophagitis: Secondary | ICD-10-CM | POA: Diagnosis not present

## 2022-12-20 DIAGNOSIS — H409 Unspecified glaucoma: Secondary | ICD-10-CM | POA: Diagnosis not present

## 2022-12-20 DIAGNOSIS — E785 Hyperlipidemia, unspecified: Secondary | ICD-10-CM

## 2022-12-20 MED ORDER — ATORVASTATIN CALCIUM 80 MG PO TABS: 80.0000 mg | ORAL_TABLET | Freq: Every evening | ORAL | 3 refills | Status: DC

## 2022-12-20 NOTE — Patient Instructions (Signed)
Medication Instructions:  1.Start Lipitor 80 mg, take one tablet nightly *If you need a refill on your cardiac medications before your next appointment, please call your pharmacy*   Lab Work: None ordered If you have labs (blood work) drawn today and your tests are completely normal, you will receive your results only by: [place] (if you have MyChart) OR A paper copy in the mail If you have any lab test that is abnormal or we need to change your treatment, we will call you to review the results.   Follow-Up: At [facility], you and your health needs are our priority.  As part of our continuing mission to provide you with exceptional heart care, we have created designated Provider Care Teams.  These Care Teams include your primary Cardiologist (physician) and Advanced Practice Providers (APPs -  Physician Assistants and Nurse Practitioners) who all work together to provide you with the care you need, when you need it.  We recommend signing up for the patient portal called "MyChart".  Sign up information is provided on this After Visit Summary.  MyChart is used to connect with patients for Virtual Visits (Telemedicine).  Patients are able to view lab/test results, encounter notes, upcoming appointments, etc.  Non-urgent messages can be sent to your provider as well.   To learn more about what you can do with MyChart, go to [email or web address].    Your next appointment:   3 month(s)  The format for your next appointment:   In Person  Provider:   Larae Grooms, MD    Important Information About Sugar

## 2022-12-24 ENCOUNTER — Ambulatory Visit: Payer: Medicare HMO | Admitting: Nutrition

## 2022-12-24 NOTE — Telephone Encounter (Signed)
Noted  

## 2022-12-24 NOTE — Telephone Encounter (Signed)
[place] nurse left vm that BS was reading high. He had not taking sugar or insulin before eating. After eating it was 350. He ate a chicken salad sandwhich the day before yesterday and he did not take insulin because he considered that a snack. She explained to them the importance of checking blood sugar and taking insulin.

## 2022-12-25 ENCOUNTER — Encounter: Payer: Medicare HMO | Attending: Endocrinology | Admitting: Nutrition

## 2022-12-25 DIAGNOSIS — E119 Type 2 diabetes mellitus without complications: Secondary | ICD-10-CM | POA: Insufficient documentation

## 2022-12-25 DIAGNOSIS — Z713 Dietary counseling and surveillance: Secondary | ICD-10-CM | POA: Insufficient documentation

## 2022-12-25 NOTE — Progress Notes (Signed)
WE discussed how this dexcom works, how to apply the sensor and Transport planner.  Pt. Reports that he has "so many Libres at home, and does not want to start this yet.  I explained that his pump will not work will without the dexcom sensors, and he is willing to delay the pump start until he uses up the [place] sensors We discussed how his pump will work, and the need to put in information into the pump when he is eating He is currently taking Lantus: 36u 8:30 AM, and 14u at 5-6PM.  Novolog: 6/6/6/ ac meals.  He reports that blood sugars are dropping low at 3AM 4-5 days/wk.  He was told to move the Lantus to bedtime (9PM), and if this does not correct the problem, to drop the insulin dose to 13u.  Explained that his pump will do this for him, and that it will also give him more insulin when his blood sugar is high.   Diet history: 8:30 oatmeal with 1T sugar,  Takes his lantus and Novolog now-10 min. Before bfast 10:30 AM;   hungry so eats a sandwich, or pancakes with sugar free syrup 2-3PM: (sugar is usually high)  eats sandwich and drinks diet soda or water with chips 6PM: supper, meat, 2-3 veg., and water to drink.  Has given up white potatoes, sweet drinks and only drinks juice (8 ounces when blood sugar is low) Discussion: Proper treatment of low blood sugar, with respect to using a CGM Make sure all meals have protein, carbs and small amount of fat List given of carbs eaten, and told that he will need 3-4 servings at every meal  Explained how this pump will help him and how he will need to either count grams of carb, or adjust insulin dose for amount of carbs eaten.  We reviewed how to read a label and that he will probably using 1u for every 10 carbs for ease of use.  He understood this and was able to calculate that 30 grams would require 3u of Novolog.   He will call me when he is down to his last [place] 2 sensor to start Dexcom training and pump training He was told to register his OmniPod 5 PDM with  [employer] and to write down his user name and password before starting on his pump.  His wife said she will do this and had no questions for me.

## 2022-12-25 NOTE — Patient Instructions (Signed)
Call office when you start your last [place] sensor.

## 2022-12-26 DIAGNOSIS — E785 Hyperlipidemia, unspecified: Secondary | ICD-10-CM | POA: Diagnosis not present

## 2022-12-26 DIAGNOSIS — N1831 Chronic kidney disease, stage 3a: Secondary | ICD-10-CM | POA: Diagnosis not present

## 2022-12-26 DIAGNOSIS — I214 Non-ST elevation (NSTEMI) myocardial infarction: Secondary | ICD-10-CM | POA: Diagnosis not present

## 2022-12-26 DIAGNOSIS — K529 Noninfective gastroenteritis and colitis, unspecified: Secondary | ICD-10-CM | POA: Diagnosis not present

## 2022-12-26 DIAGNOSIS — E1322 Other specified diabetes mellitus with diabetic chronic kidney disease: Secondary | ICD-10-CM | POA: Diagnosis not present

## 2022-12-26 DIAGNOSIS — N179 Acute kidney failure, unspecified: Secondary | ICD-10-CM | POA: Diagnosis not present

## 2022-12-26 DIAGNOSIS — I129 Hypertensive chronic kidney disease with stage 1 through stage 4 chronic kidney disease, or unspecified chronic kidney disease: Secondary | ICD-10-CM | POA: Diagnosis not present

## 2022-12-26 DIAGNOSIS — K219 Gastro-esophageal reflux disease without esophagitis: Secondary | ICD-10-CM | POA: Diagnosis not present

## 2022-12-26 DIAGNOSIS — H409 Unspecified glaucoma: Secondary | ICD-10-CM | POA: Diagnosis not present

## 2022-12-31 DIAGNOSIS — N179 Acute kidney failure, unspecified: Secondary | ICD-10-CM | POA: Diagnosis not present

## 2022-12-31 DIAGNOSIS — K219 Gastro-esophageal reflux disease without esophagitis: Secondary | ICD-10-CM | POA: Diagnosis not present

## 2022-12-31 DIAGNOSIS — I214 Non-ST elevation (NSTEMI) myocardial infarction: Secondary | ICD-10-CM | POA: Diagnosis not present

## 2022-12-31 DIAGNOSIS — H409 Unspecified glaucoma: Secondary | ICD-10-CM | POA: Diagnosis not present

## 2022-12-31 DIAGNOSIS — I129 Hypertensive chronic kidney disease with stage 1 through stage 4 chronic kidney disease, or unspecified chronic kidney disease: Secondary | ICD-10-CM | POA: Diagnosis not present

## 2022-12-31 DIAGNOSIS — K529 Noninfective gastroenteritis and colitis, unspecified: Secondary | ICD-10-CM | POA: Diagnosis not present

## 2022-12-31 DIAGNOSIS — N1831 Chronic kidney disease, stage 3a: Secondary | ICD-10-CM | POA: Diagnosis not present

## 2022-12-31 DIAGNOSIS — E785 Hyperlipidemia, unspecified: Secondary | ICD-10-CM | POA: Diagnosis not present

## 2022-12-31 DIAGNOSIS — E1322 Other specified diabetes mellitus with diabetic chronic kidney disease: Secondary | ICD-10-CM | POA: Diagnosis not present

## 2023-01-06 ENCOUNTER — Encounter: Payer: Medicare HMO | Admitting: Internal Medicine

## 2023-01-06 DIAGNOSIS — E1322 Other specified diabetes mellitus with diabetic chronic kidney disease: Secondary | ICD-10-CM | POA: Diagnosis not present

## 2023-01-06 DIAGNOSIS — I129 Hypertensive chronic kidney disease with stage 1 through stage 4 chronic kidney disease, or unspecified chronic kidney disease: Secondary | ICD-10-CM | POA: Diagnosis not present

## 2023-01-06 DIAGNOSIS — K219 Gastro-esophageal reflux disease without esophagitis: Secondary | ICD-10-CM | POA: Diagnosis not present

## 2023-01-06 DIAGNOSIS — E785 Hyperlipidemia, unspecified: Secondary | ICD-10-CM | POA: Diagnosis not present

## 2023-01-06 DIAGNOSIS — N1831 Chronic kidney disease, stage 3a: Secondary | ICD-10-CM | POA: Diagnosis not present

## 2023-01-06 DIAGNOSIS — I214 Non-ST elevation (NSTEMI) myocardial infarction: Secondary | ICD-10-CM | POA: Diagnosis not present

## 2023-01-06 DIAGNOSIS — N179 Acute kidney failure, unspecified: Secondary | ICD-10-CM | POA: Diagnosis not present

## 2023-01-06 DIAGNOSIS — K529 Noninfective gastroenteritis and colitis, unspecified: Secondary | ICD-10-CM | POA: Diagnosis not present

## 2023-01-06 DIAGNOSIS — H409 Unspecified glaucoma: Secondary | ICD-10-CM | POA: Diagnosis not present

## 2023-01-09 ENCOUNTER — Ambulatory Visit (INDEPENDENT_AMBULATORY_CARE_PROVIDER_SITE_OTHER): Payer: Medicare HMO | Admitting: Adult Health

## 2023-01-09 ENCOUNTER — Encounter: Payer: Self-pay | Admitting: Adult Health

## 2023-01-09 VITALS — BP 110/70 | HR 88 | Temp 98.0°F | Ht 75.5 in | Wt 203.0 lb

## 2023-01-09 DIAGNOSIS — E1069 Type 1 diabetes mellitus with other specified complication: Secondary | ICD-10-CM

## 2023-01-09 DIAGNOSIS — E108 Type 1 diabetes mellitus with unspecified complications: Secondary | ICD-10-CM | POA: Diagnosis not present

## 2023-01-09 DIAGNOSIS — E785 Hyperlipidemia, unspecified: Secondary | ICD-10-CM

## 2023-01-09 DIAGNOSIS — H409 Unspecified glaucoma: Secondary | ICD-10-CM | POA: Diagnosis not present

## 2023-01-09 DIAGNOSIS — Z7689 Persons encountering health services in other specified circumstances: Secondary | ICD-10-CM | POA: Diagnosis not present

## 2023-01-09 NOTE — Patient Instructions (Addendum)
It was great seeing you today   Please schedule your physical exam   Please let me know if you need anything

## 2023-01-09 NOTE — Progress Notes (Signed)
Patient presents to clinic today to establish care. He is a pleasant 77 year old male who  has a past medical history of Diabetes mellitus type II, GERD (gastroesophageal reflux disease), Glaucoma, Hyperlipidemia, and Hypertension.  He is a former patient of Dr. Jerilee Hoh.  His last physical exam was done in December 2022  Acute Concerns: Establish Care  Chronic Issues:  Hyperlipidemia due to type 1 DM -currently managed with atorvastatin 80 mg daily. He denies myalgia or fatigue Lab Results  Component Value Date   CHOL 153 05/20/2022   HDL 53.80 05/20/2022   LDLCALC 88 05/20/2022   LDLDIRECT 135.3 06/17/2013   TRIG 57.0 05/20/2022   CHOLHDL 3 05/20/2022   Type 1 DM - uncontrolled - managed by endocrinology.  He has been on insulin therapy since 2013.  He has had a history of DKA 2 times in 2023.  Currently managed with Lantus 38 units at breakfast and 14 units in the evening as well as NovoLog 6 to 8 units before meals.He is supposed to be starting on an insulin pump and are excited about this.   Glaucoma -  is seen by [facility] - reports pressures are well controlled. He takes Cosopt and Alphagan    Health Maintenance: Dental -- Does not do routine care Vision -- Routine Care  Immunizations -- UTD  Colonoscopy -- 2020 - every seven years.     Past Medical History:  Diagnosis Date   Diabetes mellitus type II    GERD (gastroesophageal reflux disease)    Glaucoma    Hyperlipidemia    Hypertension     Past Surgical History:  Procedure Laterality Date   [place]    Current Outpatient Medications on File Prior to Visit  Medication Sig Dispense Refill   atorvastatin (LIPITOR) 80 MG tablet Take 1 tablet (80 mg total) by mouth at bedtime. 90 tablet 3   brimonidine (ALPHAGAN) 0.15 % ophthalmic solution Place 1 drop into both eyes 2 (two) times daily.     Continuous Blood Gluc Receiver (DEXCOM G7 RECEIVER)  DEVI Use to check blood sugar 1 each 0   Continuous Blood Gluc Receiver (FREESTYLE LIBRE 2 READER) DEVI 1 each by Does not apply route daily. 4 each 2   Continuous Blood Gluc Sensor (DEXCOM G6 SENSOR) MISC Change every 10 days 9 each 2   Continuous Blood Gluc Sensor (FREESTYLE LIBRE 2 SENSOR) MISC 1 Device by Does not apply route every 14 (fourteen) days. 6 each 3   Continuous Blood Gluc Transmit (DEXCOM G6 TRANSMITTER) MISC Use every 3 months 1 each 2   docusate sodium (COLACE) 100 MG capsule Take 1 capsule (100 mg total) by mouth 2 (two) times daily. (Patient taking differently: Take 100 mg by mouth as needed.) 10 capsule 0   dorzolamide-timolol (COSOPT) 22.3-6.8 MG/ML ophthalmic solution Place 1 drop into both eyes 2 (two) times daily.      glucose blood (ONETOUCH VERIO) test strip 1 each by Other route 2 (two) times daily. And lancets 2/day 200 each 3   insulin aspart (NOVOLOG FLEXPEN) 100 UNIT/ML FlexPen Inject 4 Units into the skin 3 (three) times daily with meals. 15 mL 1   insulin aspart (NOVOLOG) 100 UNIT/ML injection Up to 50 units daily in insulin pump 20 mL 2   Insulin Disposable Pump (OMNIPOD 5 G6 INTRO, GEN 5,) KIT Inject into the skin.  Insulin Disposable Pump (OMNIPOD 5 G6 POD, GEN 5,) MISC SMARTSIG:SUB-Q Every 3 Days     insulin glargine (LANTUS SOLOSTAR) 100 UNIT/ML Solostar Pen 30 units in the morning, 10 units in the evening (Patient taking differently: 38 units in the morning, 14 units in the evening) 15 mL 11   Insulin Pen Needle 32G X 4 MM MISC Use as instructed to administer insulin 4-5X daily. E10.65 400 each 1   Insulin Syringe-Needle U-100 (BD VEO INSULIN SYRINGE U/F) 31G X 15/64" 1 ML MISC USE  SYRINGE TWICE DAILY 200 each 5   Lancets (ONETOUCH ULTRASOFT) lancets Use to check blood sugar 3 times a day 100 each 12   Mouthwashes (MOUTH RINSE) LIQD solution 15 mLs by Mouth Rinse route as needed (for oral care). 118 mL 0   Multiple Vitamins-Minerals (MULTIVITAMIN ADULTS  50+) TABS Take 1 tablet by mouth daily.     polyethylene glycol (MIRALAX / GLYCOLAX) 17 g packet Take 17 g by mouth daily. (Patient taking differently: Take 17 g by mouth as needed.) 14 each 0   senna-docusate (SENOKOT-S) 8.6-50 MG tablet Take 1 tablet by mouth 2 (two) times daily. (Patient taking differently: Take by mouth as needed.) 60 tablet 0   No current facility-administered medications on file prior to visit.    No Known Allergies  Family History  Problem Relation Age of Onset   Alcohol abuse Father    Diabetes Sister    Diabetes Brother    Diabetes Brother    Colon cancer Neg Hx    Esophageal cancer Neg Hx    Stomach cancer Neg Hx    Rectal cancer Neg Hx     Social History   Socioeconomic History   Marital status: Married    Spouse name: Not on file   Number of children: Not on file   Years of education: Not on file   Highest education level: Not on file  Occupational History   Not on file  Tobacco Use   Smoking status: Never   Smokeless tobacco: Never  Vaping Use   Vaping Use: Never used  Substance and Sexual Activity   Alcohol use: No   Drug use: No   Sexual activity: Not on file  Other Topics Concern   Not on file  Social History Narrative   Not on file   Social Determinants of Health   Financial Resource Strain: Not on file  Food Insecurity: No Food Insecurity (10/06/2022)   Hunger Vital Sign    Worried About Running Out of Food in the Last Year: Never true    Ran Out of Food in the Last Year: Never true  Transportation Needs: No Transportation Needs (10/06/2022)   PRAPARE - Hydrologist (Medical): No    Lack of Transportation (Non-Medical): No  Physical Activity: Sufficiently Active (05/10/2020)   Exercise Vital Sign    Days of Exercise per Week: 5 days    Minutes of Exercise per Session: 60 min  Stress: Not on file  Social Connections: Not on file  Intimate Partner Violence: Not At Risk (10/06/2022)    Humiliation, Afraid, Rape, and Kick questionnaire    Fear of Current or Ex-Partner: No    Emotionally Abused: No    Physically Abused: No    Sexually Abused: No    Review of Systems  Constitutional: Negative.   HENT: Negative.    Eyes: Negative.   Respiratory: Negative.    Cardiovascular: Negative.   Gastrointestinal: Negative.  Genitourinary: Negative.   Musculoskeletal: Negative.   Skin: Negative.   Neurological: Negative.   Endo/Heme/Allergies: Negative.   Psychiatric/Behavioral: Negative.    All other systems reviewed and are negative.   BP 110/70   Pulse 88   Temp 98 F (36.7 C) (Oral)   Ht 6' 3.5" (1.918 m)   Wt 203 lb (92.1 kg)   SpO2 98%   BMI 25.04 kg/m   Physical Exam Vitals and nursing note reviewed.  Constitutional:      Appearance: Normal appearance.  Cardiovascular:     Rate and Rhythm: Normal rate and regular rhythm.     Pulses: Normal pulses.     Heart sounds: Normal heart sounds.  Pulmonary:     Effort: Pulmonary effort is normal.     Breath sounds: Normal breath sounds.  Skin:    General: Skin is warm and dry.  Neurological:     General: No focal deficit present.     Mental Status: He is alert and oriented to person, place, and time.  Psychiatric:        Mood and Affect: Mood normal.        Behavior: Behavior normal.        Thought Content: Thought content normal.        Judgment: Judgment normal.     Recent Results (from the past 2160 hour(s))  CBG monitoring, ED     Status: Abnormal   Collection Time: 11/01/22 12:26 AM  Result Value Ref Range   Glucose-Capillary 112 (H) 70 - 99 mg/dL    Comment: Glucose reference range applies only to samples taken after fasting for at least 8 hours.  CBC     Status: Abnormal   Collection Time: 11/01/22 12:44 AM  Result Value Ref Range   WBC 5.4 4.0 - 10.5 K/uL   RBC 4.93 4.22 - 5.81 MIL/uL   Hemoglobin 13.1 13.0 - 17.0 g/dL   HCT 37.5 (L) 39.0 - 52.0 %   MCV 76.1 (L) 80.0 - 100.0 fL   MCH  26.6 26.0 - 34.0 pg   MCHC 34.9 30.0 - 36.0 g/dL   RDW 13.9 11.5 - 15.5 %   Platelets 227 150 - 400 K/uL   nRBC 0.0 0.0 - 0.2 %    Comment: Performed at [facility], [place] [address], [place], [place] [address]  Basic metabolic panel     Status: Abnormal   Collection Time: 11/01/22 12:44 AM  Result Value Ref Range   Sodium 140 135 - 145 mmol/L   Potassium 3.5 3.5 - 5.1 mmol/L   Chloride 109 98 - 111 mmol/L   CO2 22 22 - 32 mmol/L   Glucose, Bld 117 (H) 70 - 99 mg/dL    Comment: Glucose reference range applies only to samples taken after fasting for at least 8 hours.   BUN 18 8 - 23 mg/dL   Creatinine, Ser 1.42 (H) 0.61 - 1.24 mg/dL   Calcium 9.2 8.9 - 10.3 mg/dL   GFR, Estimated 52 (L) >60 mL/min    Comment: (NOTE) Calculated using the CKD-EPI Creatinine Equation (2021)    Anion gap 9 5 - 15    Comment: Performed at [facility] [address] [place], [place] [address]  CBG monitoring, ED     Status: Abnormal   Collection Time: 11/01/22  3:00 AM  Result Value Ref Range   Glucose-Capillary 229 (H) 70 - 99 mg/dL    Comment: Glucose reference range applies  only to samples taken after fasting for at least 8 hours.  Glucose, random     Status: Abnormal   Collection Time: 11/20/22  8:19 AM  Result Value Ref Range   Glucose, Bld 140 (H) 70 - 99 mg/dL  C-peptide     Status: Abnormal   Collection Time: 11/20/22  8:19 AM  Result Value Ref Range   C-Peptide <0.1 (L) 1.1 - 4.4 ng/mL    Comment: C-Peptide reference interval is for fasting patients.  CBG monitoring, ED     Status: Abnormal   Collection Time: 11/29/22  9:11 AM  Result Value Ref Range   Glucose-Capillary 170 (H) 70 - 99 mg/dL    Comment: Glucose reference range applies only to samples taken after fasting for at least 8 hours.  I-STAT, chem 8     Status: Abnormal   Collection Time: 11/29/22  9:18 AM  Result Value Ref Range   Sodium 140 135 - 145 mmol/L   Potassium 3.8 3.5 - 5.1 mmol/L   Chloride 110 98 -  111 mmol/L   BUN 19 8 - 23 mg/dL   Creatinine, Ser 1.30 (H) 0.61 - 1.24 mg/dL   Glucose, Bld 167 (H) 70 - 99 mg/dL    Comment: Glucose reference range applies only to samples taken after fasting for at least 8 hours.   Calcium, Ion 0.92 (L) 1.15 - 1.40 mmol/L   TCO2 19 (L) 22 - 32 mmol/L   Hemoglobin 13.9 13.0 - 17.0 g/dL   HCT 41.0 39.0 - 52.0 %  Ethanol     Status: None   Collection Time: 11/29/22  9:56 AM  Result Value Ref Range   Alcohol, Ethyl (B) <10 <10 mg/dL    Comment: (NOTE) Lowest detectable limit for serum alcohol is 10 mg/dL.  For medical purposes only. Performed at [facility], [place] [address], [place], [place] [address]   Protime-INR     Status: None   Collection Time: 11/29/22  9:56 AM  Result Value Ref Range   Prothrombin Time 14.6 11.4 - 15.2 seconds   INR 1.2 0.8 - 1.2    Comment: (NOTE) INR goal varies based on device and disease states. Performed at [facility], [place] [address], [place], [place] [address]   APTT     Status: None   Collection Time: 11/29/22  9:56 AM  Result Value Ref Range   aPTT 27 24 - 36 seconds    Comment: Performed at [facility] [address] [place], [place] [address]  CBC     Status: Abnormal   Collection Time: 11/29/22  9:56 AM  Result Value Ref Range   WBC 5.8 4.0 - 10.5 K/uL   RBC 4.88 4.22 - 5.81 MIL/uL   Hemoglobin 12.6 (L) 13.0 - 17.0 g/dL   HCT 36.4 (L) 39.0 - 52.0 %   MCV 74.6 (L) 80.0 - 100.0 fL   MCH 25.8 (L) 26.0 - 34.0 pg   MCHC 34.6 30.0 - 36.0 g/dL   RDW 14.3 11.5 - 15.5 %   Platelets 214 150 - 400 K/uL   nRBC 0.0 0.0 - 0.2 %    Comment: Performed at [facility], [place] [address] [place], [place] [address]  Differential     Status: None   Collection Time: 11/29/22  9:56 AM  Result Value Ref Range   Neutrophils Relative % 69 %   Neutro Abs 4.0 1.7 - 7.7 K/uL   Lymphocytes  Relative 17 %   Lymphs Abs 1.0 0.7 - 4.0 K/uL   Monocytes Relative 11 %   Monocytes Absolute 0.7 0.1 -  1.0 K/uL   Eosinophils Relative 2 %   Eosinophils Absolute 0.1 0.0 - 0.5 K/uL   Basophils Relative 1 %   Basophils Absolute 0.0 0.0 - 0.1 K/uL   Immature Granulocytes 0 %   Abs Immature Granulocytes 0.01 0.00 - 0.07 K/uL    Comment: Performed at [facility] [address] [place], [place] [address]  Comprehensive metabolic panel     Status: Abnormal   Collection Time: 11/29/22  9:56 AM  Result Value Ref Range   Sodium 136 135 - 145 mmol/L   Potassium 4.4 3.5 - 5.1 mmol/L   Chloride 105 98 - 111 mmol/L   CO2 22 22 - 32 mmol/L   Glucose, Bld 267 (H) 70 - 99 mg/dL    Comment: Glucose reference range applies only to samples taken after fasting for at least 8 hours.   BUN 18 8 - 23 mg/dL   Creatinine, Ser 1.36 (H) 0.61 - 1.24 mg/dL   Calcium 8.5 (L) 8.9 - 10.3 mg/dL   Total Protein 5.9 (L) 6.5 - 8.1 g/dL   Albumin 3.4 (L) 3.5 - 5.0 g/dL   AST 20 15 - 41 U/L   ALT 16 0 - 44 U/L   Alkaline Phosphatase 72 38 - 126 U/L   Total Bilirubin 0.9 0.3 - 1.2 mg/dL   GFR, Estimated 54 (L) >60 mL/min    Comment: (NOTE) Calculated using the CKD-EPI Creatinine Equation (2021)    Anion gap 9 5 - 15    Comment: Performed at [facility], [place] [address], [place], [place] [address]  Urine rapid drug screen (hosp performed)     Status: None   Collection Time: 11/29/22 10:52 AM  Result Value Ref Range   Opiates NONE DETECTED NONE DETECTED   Cocaine NONE DETECTED NONE DETECTED   Benzodiazepines NONE DETECTED NONE DETECTED   Amphetamines NONE DETECTED NONE DETECTED   Tetrahydrocannabinol NONE DETECTED NONE DETECTED   Barbiturates NONE DETECTED NONE DETECTED    Comment: (NOTE) DRUG SCREEN FOR MEDICAL PURPOSES ONLY.  IF CONFIRMATION IS NEEDED FOR ANY PURPOSE, NOTIFY LAB WITHIN 5 DAYS.  LOWEST DETECTABLE LIMITS FOR URINE DRUG SCREEN Drug Class                     Cutoff (ng/mL) Amphetamine and metabolites    1000 Barbiturate and metabolites    200 Benzodiazepine                  200 Opiates and metabolites        300 Cocaine and metabolites        300 THC                            50 Performed at [facility], [place] [address] [place], [place] [address]   Urinalysis, Routine w reflex microscopic     Status: Abnormal   Collection Time: 11/29/22 10:52 AM  Result Value Ref Range   Color, Urine YELLOW YELLOW   APPearance CLEAR CLEAR   Specific Gravity, Urine 1.037 (H) 1.005 - 1.030   pH 7.0 5.0 - 8.0   Glucose, UA >=500 (A) NEGATIVE mg/dL   Hgb urine dipstick NEGATIVE NEGATIVE   Bilirubin Urine NEGATIVE NEGATIVE   Ketones, ur NEGATIVE  NEGATIVE mg/dL   Protein, ur NEGATIVE NEGATIVE mg/dL   Nitrite NEGATIVE NEGATIVE   Leukocytes,Ua NEGATIVE NEGATIVE   RBC / HPF 0-5 0 - 5 RBC/hpf   Bacteria, UA NONE SEEN NONE SEEN   Squamous Epithelial / HPF 0-5 0 - 5    Comment: Performed at [facility], [place] [address] [place], [place] [address]  Resp panel by RT-PCR (RSV, Flu A&B, Covid) Anterior Nasal Swab     Status: None   Collection Time: 11/29/22  3:48 PM   Specimen: Anterior Nasal Swab  Result Value Ref Range   SARS Coronavirus 2 by RT PCR NEGATIVE NEGATIVE    Comment: (NOTE) SARS-CoV-2 target nucleic acids are NOT DETECTED.  The SARS-CoV-2 RNA is generally detectable in upper respiratory specimens during the acute phase of infection. The lowest concentration of SARS-CoV-2 viral copies this assay can detect is 138 copies/mL. A negative result does not preclude SARS-Cov-2 infection and should not be used as the sole basis for treatment or other patient management decisions. A negative result may occur with  improper specimen collection/handling, submission of specimen other than nasopharyngeal swab, presence of viral mutation(s) within the areas targeted by this assay, and inadequate number of viral copies(<138 copies/mL). A negative result must be combined with clinical observations, patient history, and epidemiological information. The expected  result is Negative.  Fact Sheet for Patients:  EntrepreneurPulse.com.au  Fact Sheet for Healthcare Providers:  IncredibleEmployment.be  This test is no t yet approved or cleared by the [place] FDA and  has been authorized for detection and/or diagnosis of SARS-CoV-2 by FDA under an Emergency Use Authorization (EUA). This EUA will remain  in effect (meaning this test can be used) for the duration of the COVID-19 declaration under Section 564(b)(1) of the Act, 21 U.S.C.section 360bbb-3(b)(1), unless the authorization is terminated  or revoked sooner.       Influenza A by PCR NEGATIVE NEGATIVE   Influenza B by PCR NEGATIVE NEGATIVE    Comment: (NOTE) The Xpert Xpress SARS-CoV-2/FLU/RSV plus assay is intended as an aid in the diagnosis of influenza from Nasopharyngeal swab specimens and should not be used as a sole basis for treatment. Nasal washings and aspirates are unacceptable for Xpert Xpress SARS-CoV-2/FLU/RSV testing.  Fact Sheet for Patients: EntrepreneurPulse.com.au  Fact Sheet for Healthcare Providers: IncredibleEmployment.be  This test is not yet approved or cleared by the [place] FDA and has been authorized for detection and/or diagnosis of SARS-CoV-2 by FDA under an Emergency Use Authorization (EUA). This EUA will remain in effect (meaning this test can be used) for the duration of the COVID-19 declaration under Section 564(b)(1) of the Act, 21 U.S.C. section 360bbb-3(b)(1), unless the authorization is terminated or revoked.     Resp Syncytial Virus by PCR NEGATIVE NEGATIVE    Comment: (NOTE) Fact Sheet for Patients: EntrepreneurPulse.com.au  Fact Sheet for Healthcare Providers: IncredibleEmployment.be  This test is not yet approved or cleared by the [place] FDA and has been authorized for detection and/or diagnosis of SARS-CoV-2  by FDA under an Emergency Use Authorization (EUA). This EUA will remain in effect (meaning this test can be used) for the duration of the COVID-19 declaration under Section 564(b)(1) of the Act, 21 U.S.C. section 360bbb-3(b)(1), unless the authorization is terminated or revoked.  Performed at [facility], [place] [address], [place], [place] [address]   CBG monitoring, ED     Status: Abnormal   Collection Time: 12/03/22  3:47 AM  Result  Value Ref Range   Glucose-Capillary >600 (HH) 70 - 99 mg/dL    Comment: Glucose reference range applies only to samples taken after fasting for at least 8 hours.  CBC with Differential     Status: Abnormal   Collection Time: 12/03/22  4:00 AM  Result Value Ref Range   WBC 19.4 (H) 4.0 - 10.5 K/uL   RBC 4.92 4.22 - 5.81 MIL/uL   Hemoglobin 12.7 (L) 13.0 - 17.0 g/dL   HCT 38.7 (L) 39.0 - 52.0 %   MCV 78.7 (L) 80.0 - 100.0 fL   MCH 25.8 (L) 26.0 - 34.0 pg   MCHC 32.8 30.0 - 36.0 g/dL   RDW 15.3 11.5 - 15.5 %   Platelets 310 150 - 400 K/uL   nRBC 0.0 0.0 - 0.2 %   Neutrophils Relative % 84 %   Neutro Abs 16.2 (H) 1.7 - 7.7 K/uL   Lymphocytes Relative 9 %   Lymphs Abs 1.8 0.7 - 4.0 K/uL   Monocytes Relative 6 %   Monocytes Absolute 1.2 (H) 0.1 - 1.0 K/uL   Eosinophils Relative 0 %   Eosinophils Absolute 0.0 0.0 - 0.5 K/uL   Basophils Relative 0 %   Basophils Absolute 0.0 0.0 - 0.1 K/uL   Immature Granulocytes 1 %   Abs Immature Granulocytes 0.19 (H) 0.00 - 0.07 K/uL    Comment: Performed at [facility], [address], [place], [place] [address]  Basic metabolic panel     Status: Abnormal   Collection Time: 12/03/22  4:00 AM  Result Value Ref Range   Sodium 129 (L) 135 - 145 mmol/L   Potassium 6.6 (HH) 3.5 - 5.1 mmol/L    Comment: REPEATED TO VERIFY CRITICAL RESULT CALLED TO, READ BACK BY AND VERIFIED WITH: Merry Lofty, RN AT [phone] ON 12/03/22 BY CM    Chloride 85 (L) 98 - 111 mmol/L   CO2 7 (L) 22 - 32 mmol/L    Glucose, Bld >1,200 (HH) 70 - 99 mg/dL    Comment: Glucose reference range applies only to samples taken after fasting for at least 8 hours. REPEATED TO VERIFY RESULTS CONFIRMED BY MANUAL DILUTION CRITICAL RESULT CALLED TO, READ BACK BY AND VERIFIED WITH: JAMIE Marisue Ivan, RN AT [phone] ON 12/03/22 BY CM    BUN 60 (H) 8 - 23 mg/dL   Creatinine, Ser 3.27 (H) 0.61 - 1.24 mg/dL   Calcium 8.9 8.9 - 10.3 mg/dL   GFR, Estimated 19 (L) >60 mL/min    Comment: (NOTE) Calculated using the CKD-EPI Creatinine Equation (2021)    Anion gap 37 (H) 5 - 15    Comment: Electrolytes repeated to confirm. Performed at [facility], [address] [place], [place] [address]   Troponin I (High Sensitivity)     Status: Abnormal   Collection Time: 12/03/22  4:00 AM  Result Value Ref Range   Troponin I (High Sensitivity) 491 (HH) <18 ng/L    Comment: CRITICAL RESULT CALLED TO, READ BACK BY AND VERIFIED WITH: Merry Lofty, RN AT [phone] ON 12/03/22 BY CM (NOTE) Elevated high sensitivity troponin I (hsTnI) values and significant  changes across serial measurements may suggest ACS but many other  chronic and acute conditions are known to elevate hsTnI results.  Refer to the Links section for chest pain algorithms and additional  guidance. Performed at [facility], [address], [place], [place] [address]   I-Stat venous blood gas, [facility] ED, [facility], [facility])  Status: Abnormal   Collection Time: 12/03/22  4:06 AM  Result Value Ref Range   pH, Ven 7.156 (LL) 7.25 - 7.43   pCO2, Ven 21.5 (L) 44 - 60 mmHg   pO2, Ven 40 32 - 45 mmHg   Bicarbonate 7.6 (L) 20.0 - 28.0 mmol/L   TCO2 8 (L) 22 - 32 mmol/L   O2 Saturation 62 %   Acid-base deficit 19.0 (H) 0.0 - 2.0 mmol/L   Sodium 126 (L) 135 - 145 mmol/L   Potassium 6.4 (HH) 3.5 - 5.1 mmol/L   Calcium, Ion 1.05 (L) 1.15 - 1.40 mmol/L   HCT 41.0 39.0 - 52.0 %   Hemoglobin 13.9 13.0 - 17.0 g/dL   Collection site IV start    Drawn by  Nurse    Sample type VENOUS    Comment NOTIFIED PHYSICIAN   Beta-hydroxybutyric acid     Status: Abnormal   Collection Time: 12/03/22  4:06 AM  Result Value Ref Range   Beta-Hydroxybutyric Acid >8.00 (H) 0.05 - 0.27 mmol/L    Comment: RESULT CONFIRMED BY MANUAL DILUTION Performed at [facility], [address] [place],  [address]   CBG monitoring, ED     Status: Abnormal   Collection Time: 12/03/22  4:34 AM  Result Value Ref Range   Glucose-Capillary >600 (HH) 70 - 99 mg/dL    Comment: Glucose reference range applies only to samples taken after fasting for at least 8 hours.  Resp panel by RT-PCR (RSV, Flu A&B, Covid) Anterior Nasal Swab     Status: None   Collection Time: 12/03/22  5:00 AM   Specimen: Anterior Nasal Swab  Result Value Ref Range   SARS Coronavirus 2 by RT PCR NEGATIVE NEGATIVE    Comment: (NOTE) SARS-CoV-2 target nucleic acids are NOT DETECTED.  The SARS-CoV-2 RNA is generally detectable in upper respiratory specimens during the acute phase of infection. The lowest concentration of SARS-CoV-2 viral copies this assay can detect is 138 copies/mL. A negative result does not preclude SARS-Cov-2 infection and should not be used as the sole basis for treatment or other patient management decisions. A negative result may occur with  improper specimen collection/handling, submission of specimen other than nasopharyngeal swab, presence of viral mutation(s) within the areas targeted by this assay, and inadequate number of viral copies(<138 copies/mL). A negative result must be combined with clinical observations, patient history, and epidemiological information. The expected result is Negative.  Fact Sheet for Patients:  EntrepreneurPulse.com.au  Fact Sheet for Healthcare Providers:  IncredibleEmployment.be  This test is no t yet approved or cleared by the [place] FDA and  has been authorized for detection and/or  diagnosis of SARS-CoV-2 by FDA under an Emergency Use Authorization (EUA). This EUA will remain  in effect (meaning this test can be used) for the duration of the COVID-19 declaration under Section 564(b)(1) of the Act, 21 U.S.C.section 360bbb-3(b)(1), unless the authorization is terminated  or revoked sooner.       Influenza A by PCR NEGATIVE NEGATIVE   Influenza B by PCR NEGATIVE NEGATIVE    Comment: (NOTE) The Xpert Xpress SARS-CoV-2/FLU/RSV plus assay is intended as an aid in the diagnosis of influenza from Nasopharyngeal swab specimens and should not be used as a sole basis for treatment. Nasal washings and aspirates are unacceptable for Xpert Xpress SARS-CoV-2/FLU/RSV testing.  Fact Sheet for Patients: EntrepreneurPulse.com.au  Fact Sheet for Healthcare Providers: IncredibleEmployment.be  This test is not yet approved or cleared by the  [place] Architectural technologist and has been authorized for detection and/or diagnosis of SARS-CoV-2 by FDA under an Print production planner (EUA). This EUA will remain in effect (meaning this test can be used) for the duration of the COVID-19 declaration under Section 564(b)(1) of the Act, 21 U.S.C. section 360bbb-3(b)(1), unless the authorization is terminated or revoked.     Resp Syncytial Virus by PCR NEGATIVE NEGATIVE    Comment: (NOTE) Fact Sheet for Patients: EntrepreneurPulse.com.au  Fact Sheet for Healthcare Providers: IncredibleEmployment.be  This test is not yet approved or cleared by the [place] FDA and has been authorized for detection and/or diagnosis of SARS-CoV-2 by FDA under an Emergency Use Authorization (EUA). This EUA will remain in effect (meaning this test can be used) for the duration of the COVID-19 declaration under Section 564(b)(1) of the Act, 21 U.S.C. section 360bbb-3(b)(1), unless the authorization is terminated or revoked.  Performed  at [facility], [address], [place], [place] [address]   CBG monitoring, ED     Status: Abnormal   Collection Time: 12/03/22  5:17 AM  Result Value Ref Range   Glucose-Capillary >600 (HH) 70 - 99 mg/dL    Comment: Glucose reference range applies only to samples taken after fasting for at least 8 hours.   Comment 1 RN AWARE   CBG monitoring, ED     Status: Abnormal   Collection Time: 12/03/22  5:54 AM  Result Value Ref Range   Glucose-Capillary >600 (HH) 70 - 99 mg/dL    Comment: Glucose reference range applies only to samples taken after fasting for at least 8 hours.  Urinalysis, Routine w reflex microscopic Urine, Clean Catch     Status: Abnormal   Collection Time: 12/03/22  6:00 AM  Result Value Ref Range   Color, Urine COLORLESS (A) YELLOW   APPearance CLEAR CLEAR   Specific Gravity, Urine 1.022 1.005 - 1.030   pH 5.0 5.0 - 8.0   Glucose, UA >1,000 (A) NEGATIVE mg/dL   Hgb urine dipstick NEGATIVE NEGATIVE   Bilirubin Urine NEGATIVE NEGATIVE   Ketones, ur 15 (A) NEGATIVE mg/dL   Protein, ur NEGATIVE NEGATIVE mg/dL   Nitrite NEGATIVE NEGATIVE   Leukocytes,Ua NEGATIVE NEGATIVE   RBC / HPF 0-5 0 - 5 RBC/hpf   WBC, UA 0-5 0 - 5 WBC/hpf   Bacteria, UA RARE (A) NONE SEEN   Mucus PRESENT     Comment: Performed at [facility], [place], [place] [address]  Troponin I (High Sensitivity)     Status: Abnormal   Collection Time: 12/03/22  6:05 AM  Result Value Ref Range   Troponin I (High Sensitivity) 584 (HH) <18 ng/L    Comment: CRITICAL RESULT CALLED TO, READ BACK BY AND VERIFIED WITH: TRASHAWN OQUENDO, RN AT [phone] ON 12/03/22 BY CM DELTA CHECK NOTED (NOTE) Elevated high sensitivity troponin I (hsTnI) values and significant  changes across serial measurements may suggest ACS but many other  chronic and acute conditions are known to elevate hsTnI results.  Refer to the Links section for chest pain algorithms and additional   guidance. Performed at [facility], [address], [place], [place] [address]   CBG monitoring, ED     Status: Abnormal   Collection Time: 12/03/22  6:24 AM  Result Value Ref Range   Glucose-Capillary >600 (HH) 70 - 99 mg/dL    Comment: Glucose reference range applies only to samples taken after fasting for at least 8 hours.  CBG  monitoring, ED     Status: Abnormal   Collection Time: 12/03/22  6:58 AM  Result Value Ref Range   Glucose-Capillary >600 (HH) 70 - 99 mg/dL    Comment: Glucose reference range applies only to samples taken after fasting for at least 8 hours.  CBG monitoring, ED     Status: Abnormal   Collection Time: 12/03/22  7:42 AM  Result Value Ref Range   Glucose-Capillary >600 (HH) 70 - 99 mg/dL    Comment: Glucose reference range applies only to samples taken after fasting for at least 8 hours.  Magnesium     Status: None   Collection Time: 12/03/22  7:58 AM  Result Value Ref Range   Magnesium 2.2 1.7 - 2.4 mg/dL    Comment: Performed at [facility], [address], [place], [place] [address]  Phosphorus     Status: Abnormal   Collection Time: 12/03/22  7:58 AM  Result Value Ref Range   Phosphorus 5.4 (H) 2.5 - 4.6 mg/dL    Comment: Performed at [facility], [address], [place], [place] [address]  Basic metabolic panel     Status: Abnormal   Collection Time: 12/03/22  7:58 AM  Result Value Ref Range   Sodium 138 135 - 145 mmol/L    Comment: Performed at [facility], [address] [place], [place] [address]   Potassium 4.6 3.5 - 5.1 mmol/L    Comment: DELTA CHECK NOTED Performed at [facility], [address] [place], [place] [address]    Chloride 100 98 - 111 mmol/L    Comment: Performed at [facility], [address], [place], [place] [address]   CO2 9 (L) 22 - 32 mmol/L    Comment: Performed at [facility], [place], [place] [address]   Glucose, Bld 978 (HH) 70 - 99 mg/dL    Comment: Glucose reference range applies only to samples taken after fasting for at least 8 hours. RESULT CONFIRMED BY AUTOMATED DILUTION REPEATED TO VERIFY CRITICAL RESULT CALLED TO, READ BACK BY AND VERIFIED WITH: [place], rn 12/03/2022 src [phone]  Performed at [facility], [address] [place], [place] [address]    BUN 61 (H) 8 - 23 mg/dL    Comment: Performed at [facility], [address], [place], [place] [address]   Creatinine, Ser 3.03 (H) 0.61 - 1.24 mg/dL    Comment: Performed at [facility], [place], [place] [address]   Calcium 8.0 (L) 8.9 - 10.3 mg/dL    Comment: Performed at [facility], [address], [place], [place] [address]   GFR, Estimated 21 (L) >60 mL/min    Comment: (NOTE) Calculated using the CKD-EPI Creatinine Equation (2021) Performed at [facility], [place], [place] [address]    Anion gap 29 (H) 5 - 15    Comment: Electrolytes repeated to confirm. Performed at [facility], [address] [place], [place] [address]   Troponin I (High Sensitivity)     Status: Abnormal   Collection Time: 12/03/22  7:58 AM  Result Value Ref Range   Troponin I (High Sensitivity) 1,021 (HH) <18 ng/L    Comment: CRITICAL RESULT CALLED TO, READ BACK BY AND VERIFIED WITH: [place], rn 12/03/2022 src [id number] (NOTE) Elevated high sensitivity troponin I (hsTnI) values and significant  changes across serial measurements may suggest ACS but many other  chronic and acute conditions are known to elevate hsTnI results.  Refer to the Links section for chest pain algorithms and additional  guidance. Performed at [facility], [address] [place], [place] [address]   CBG monitoring, ED     Status:  Abnormal   Collection Time: 12/03/22  8:31 AM  Result Value Ref Range   Glucose-Capillary >600 (HH) 70 - 99 mg/dL    Comment: Glucose reference range applies only to samples taken after fasting for at least 8 hours.  CBG monitoring, ED     Status: Abnormal   Collection Time: 12/03/22  9:12 AM  Result Value Ref Range   Glucose-Capillary >600 (HH) 70 - 99 mg/dL    Comment: Glucose reference range applies only to samples taken after fasting for at least 8 hours.  CBG monitoring, ED     Status: Abnormal   Collection Time: 12/03/22 11:00 AM  Result Value Ref Range   Glucose-Capillary 479 (H) 70 - 99 mg/dL    Comment: Glucose reference range applies only to samples taken after fasting for at least 8 hours.  Glucose, capillary     Status: Abnormal   Collection Time: 12/03/22 11:55 AM  Result Value Ref Range   Glucose-Capillary 530 (HH) 70 - 99 mg/dL    Comment: Glucose reference range applies only to samples taken after fasting for at least 8 hours.   Comment 1 Document in Chart   Beta-hydroxybutyric acid     Status: Abnormal   Collection Time: 12/03/22 12:28 PM  Result Value Ref Range   Beta-Hydroxybutyric Acid 4.21 (H) 0.05 - 0.27 mmol/L    Comment: Performed at [facility], [place] [address] [place], [place] [address]  Heparin level (unfractionated)     Status: None   Collection Time: 12/03/22 12:30 PM  Result Value Ref Range   Heparin Unfractionated 0.64 0.30 - 0.70 IU/mL    Comment: (NOTE) The clinical reportable range upper limit is being lowered to >1.10 to align with the FDA approved guidance for the current laboratory assay.  If heparin results are below expected values, and patient dosage has  been confirmed, suggest follow up testing of antithrombin III levels. Performed at [facility], [place] [address], [place], [place] [address]   Basic metabolic panel     Status: Abnormal   Collection Time: 12/03/22 12:30 PM  Result Value Ref Range   Sodium 140 135 - 145  mmol/L   Potassium 4.0 3.5 - 5.1 mmol/L   Chloride 108 98 - 111 mmol/L   CO2 16 (L) 22 - 32 mmol/L   Glucose, Bld 534 (HH) 70 - 99 mg/dL    Comment: CRITICAL RESULT CALLED TO, READ BACK BY AND VERIFIED WITH J.BASTABRE RN '@[id number]'$  12.19.2023 E.AHMED Glucose reference range applies only to samples taken after fasting for at least 8 hours.    BUN 56 (H) 8 - 23 mg/dL   Creatinine, Ser 2.97 (H) 0.61 - 1.24 mg/dL   Calcium 8.2 (L) 8.9 - 10.3 mg/dL   GFR, Estimated 21 (L) >60 mL/min    Comment: (NOTE) Calculated using the CKD-EPI Creatinine Equation (2021)    Anion gap 16 (H) 5 - 15    Comment: Performed at [facility] [address], [place], [place] [address]  Troponin I (High Sensitivity)     Status: Abnormal   Collection Time: 12/03/22 12:30 PM  Result Value Ref Range   Troponin I (High Sensitivity) 2,884 (HH) <18 ng/L  Comment: CRITICAL RESULT CALLED TO, READ BACK BY AND VERIFIED WITH H.MCILNTOSH RN '@1527'$  12.19.2023 E.AHMED (NOTE) Elevated high sensitivity troponin I (hsTnI) values and significant  changes across serial measurements may suggest ACS but many other  chronic and acute conditions are known to elevate hsTnI results.  Refer to the "Links" section for chest pain algorithms and additional  guidance. Performed at [facility], [place] [address] [place], [place] [address]   Glucose, capillary     Status: Abnormal   Collection Time: 12/03/22  1:00 PM  Result Value Ref Range   Glucose-Capillary 509 (HH) 70 - 99 mg/dL    Comment: Glucose reference range applies only to samples taken after fasting for at least 8 hours.   Comment 1 Document in Chart   Glucose, capillary     Status: Abnormal   Collection Time: 12/03/22  2:03 PM  Result Value Ref Range   Glucose-Capillary 419 (H) 70 - 99 mg/dL    Comment: Glucose reference range applies only to samples taken after fasting for at least 8 hours.  Glucose, capillary     Status: Abnormal   Collection Time: 12/03/22   2:56 PM  Result Value Ref Range   Glucose-Capillary 375 (H) 70 - 99 mg/dL    Comment: Glucose reference range applies only to samples taken after fasting for at least 8 hours.  Basic metabolic panel     Status: Abnormal   Collection Time: 12/03/22  2:59 PM  Result Value Ref Range   Sodium 144 135 - 145 mmol/L   Potassium 3.6 3.5 - 5.1 mmol/L   Chloride 111 98 - 111 mmol/L   CO2 24 22 - 32 mmol/L   Glucose, Bld 334 (H) 70 - 99 mg/dL    Comment: Glucose reference range applies only to samples taken after fasting for at least 8 hours.   BUN 53 (H) 8 - 23 mg/dL   Creatinine, Ser 2.71 (H) 0.61 - 1.24 mg/dL   Calcium 8.5 (L) 8.9 - 10.3 mg/dL   GFR, Estimated 24 (L) >60 mL/min    Comment: (NOTE) Calculated using the CKD-EPI Creatinine Equation (2021)    Anion gap 9 5 - 15    Comment: Performed at [facility] [address], [place], [place] [address]  Lipase, blood     Status: None   Collection Time: 12/03/22  2:59 PM  Result Value Ref Range   Lipase 24 11 - 51 U/L    Comment: Performed at [facility], [place] [address], [place], [place] [address]  CK     Status: Abnormal   Collection Time: 12/03/22  2:59 PM  Result Value Ref Range   Total CK 520 (H) 49 - 397 U/L    Comment: Performed at [facility], [place] [address], [place], [place] [address]  Troponin I (High Sensitivity)     Status: Abnormal   Collection Time: 12/03/22  2:59 PM  Result Value Ref Range   Troponin I (High Sensitivity) 4,389 (HH) <18 ng/L    Comment: CRITICAL VALUE NOTED. VALUE IS CONSISTENT WITH PREVIOUSLY REPORTED/CALLED VALUE (NOTE) Elevated high sensitivity troponin I (hsTnI) values and significant  changes across serial measurements may suggest ACS but many other  chronic and acute conditions are known to elevate hsTnI results.  Refer to the "Links" section for chest pain algorithms and additional  guidance. Performed at [facility], [place] [address], [place], [place] [address]    Glucose, capillary  Status: Abnormal   Collection Time: 12/03/22  3:58 PM  Result Value Ref Range   Glucose-Capillary 278 (H) 70 - 99 mg/dL    Comment: Glucose reference range applies only to samples taken after fasting for at least 8 hours.  Troponin I (High Sensitivity)     Status: Abnormal   Collection Time: 12/03/22  4:34 PM  Result Value Ref Range   Troponin I (High Sensitivity) 4,344 (HH) <18 ng/L    Comment: CRITICAL VALUE NOTED. VALUE IS CONSISTENT WITH PREVIOUSLY REPORTED/CALLED VALUE (NOTE) Elevated high sensitivity troponin I (hsTnI) values and significant  changes across serial measurements may suggest ACS but many other  chronic and acute conditions are known to elevate hsTnI results.  Refer to the "Links" section for chest pain algorithms and additional  guidance. Performed at [facility], [place] [address], [place], [place] [address]   Glucose, capillary     Status: Abnormal   Collection Time: 12/03/22  4:54 PM  Result Value Ref Range   Glucose-Capillary 235 (H) 70 - 99 mg/dL    Comment: Glucose reference range applies only to samples taken after fasting for at least 8 hours.  Glucose, capillary     Status: Abnormal   Collection Time: 12/03/22  5:58 PM  Result Value Ref Range   Glucose-Capillary 175 (H) 70 - 99 mg/dL    Comment: Glucose reference range applies only to samples taken after fasting for at least 8 hours.  Glucose, capillary     Status: Abnormal   Collection Time: 12/03/22  9:15 PM  Result Value Ref Range   Glucose-Capillary 225 (H) 70 - 99 mg/dL    Comment: Glucose reference range applies only to samples taken after fasting for at least 8 hours.  Heparin level (unfractionated)     Status: None   Collection Time: 12/04/22  1:57 AM  Result Value Ref Range   Heparin Unfractionated 0.44 0.30 - 0.70 IU/mL    Comment: (NOTE) The clinical reportable range upper limit is being lowered to >1.10 to align with the FDA approved guidance for the current  laboratory assay.  If heparin results are below expected values, and patient dosage has  been confirmed, suggest follow up testing of antithrombin III levels. Performed at [facility], [place] [address], [place], [place] [address]   Basic metabolic panel     Status: Abnormal   Collection Time: 12/04/22  1:57 AM  Result Value Ref Range   Sodium 145 135 - 145 mmol/L   Potassium 4.1 3.5 - 5.1 mmol/L   Chloride 116 (H) 98 - 111 mmol/L   CO2 21 (L) 22 - 32 mmol/L   Glucose, Bld 246 (H) 70 - 99 mg/dL    Comment: Glucose reference range applies only to samples taken after fasting for at least 8 hours.   BUN 43 (H) 8 - 23 mg/dL   Creatinine, Ser 2.07 (H) 0.61 - 1.24 mg/dL   Calcium 8.1 (L) 8.9 - 10.3 mg/dL   GFR, Estimated 33 (L) >60 mL/min    Comment: (NOTE) Calculated using the CKD-EPI Creatinine Equation (2021)    Anion gap 8 5 - 15    Comment: Performed at [facility] [address], [place], [place] [address]  Troponin I (High Sensitivity)     Status: Abnormal   Collection Time: 12/04/22  6:06 AM  Result Value Ref Range   Troponin I (High Sensitivity) 2,475 (HH) <18 ng/L    Comment: CRITICAL VALUE NOTED.  VALUE IS CONSISTENT WITH PREVIOUSLY REPORTED/CALLED VALUE (NOTE) Elevated high sensitivity troponin I (hsTnI) values and significant  changes across serial measurements may suggest ACS but many other  chronic and acute conditions are known to elevate hsTnI results.  Refer to the "Links" section for chest pain algorithms and additional  guidance. Performed at [facility], [place] [address] [place], [place] [address]   Glucose, capillary     Status: Abnormal   Collection Time: 12/04/22  7:46 AM  Result Value Ref Range   Glucose-Capillary 358 (H) 70 - 99 mg/dL    Comment: Glucose reference range applies only to samples taken after fasting for at least 8 hours.  Glucose, capillary     Status: Abnormal   Collection Time: 12/04/22 11:13 AM  Result Value Ref  Range   Glucose-Capillary 252 (H) 70 - 99 mg/dL    Comment: Glucose reference range applies only to samples taken after fasting for at least 8 hours.  ECHOCARDIOGRAM COMPLETE     Status: None   Collection Time: 12/04/22  1:53 PM  Result Value Ref Range   Weight 3,296 oz   Height 75 in   BP 143/92 mmHg   Single Plane A2C EF 38.0 %   Single Plane A4C EF 47.4 %   Calc EF 43.1 %   S' Lateral 3.17 cm   Area-P 1/2 4.07 cm2  Glucose, capillary     Status: Abnormal   Collection Time: 12/04/22  3:12 PM  Result Value Ref Range   Glucose-Capillary 185 (H) 70 - 99 mg/dL    Comment: Glucose reference range applies only to samples taken after fasting for at least 8 hours.  Glucose, capillary     Status: Abnormal   Collection Time: 12/04/22  9:18 PM  Result Value Ref Range   Glucose-Capillary 151 (H) 70 - 99 mg/dL    Comment: Glucose reference range applies only to samples taken after fasting for at least 8 hours.  CBC     Status: Abnormal   Collection Time: 12/05/22  5:06 AM  Result Value Ref Range   WBC 12.2 (H) 4.0 - 10.5 K/uL   RBC 4.35 4.22 - 5.81 MIL/uL   Hemoglobin 11.3 (L) 13.0 - 17.0 g/dL   HCT 32.0 (L) 39.0 - 52.0 %   MCV 73.6 (L) 80.0 - 100.0 fL   MCH 26.0 26.0 - 34.0 pg   MCHC 35.3 30.0 - 36.0 g/dL   RDW 14.3 11.5 - 15.5 %   Platelets 178 150 - 400 K/uL   nRBC 0.0 0.0 - 0.2 %    Comment: Performed at [facility], [place] [address] [place], [place] [address]  Heparin level (unfractionated)     Status: None   Collection Time: 12/05/22  5:06 AM  Result Value Ref Range   Heparin Unfractionated 0.44 0.30 - 0.70 IU/mL    Comment: (NOTE) The clinical reportable range upper limit is being lowered to >1.10 to align with the FDA approved guidance for the current laboratory assay.  If heparin results are below expected values, and patient dosage has  been confirmed, suggest follow up testing of antithrombin III levels. Performed at [facility], [place] [address]  [place], [place] [address]   Hemoglobin A1c     Status: Abnormal   Collection Time: 12/05/22  5:06 AM  Result Value Ref Range   Hgb A1c MFr Bld 8.9 (H) 4.8 - 5.6 %    Comment: (NOTE)  Prediabetes: 5.7 - 6.4         Diabetes: >6.4         Glycemic control for adults with diabetes: <7.0    Mean Plasma Glucose 209 mg/dL    Comment: (NOTE) Performed At: [facility] [place], [place] [id number] Rush Farmer MD JO:[id number]   Glucose, capillary     Status: Abnormal   Collection Time: 12/05/22  7:54 AM  Result Value Ref Range   Glucose-Capillary 160 (H) 70 - 99 mg/dL    Comment: Glucose reference range applies only to samples taken after fasting for at least 8 hours.  Glucose, capillary     Status: Abnormal   Collection Time: 12/05/22 11:23 AM  Result Value Ref Range   Glucose-Capillary 250 (H) 70 - 99 mg/dL    Comment: Glucose reference range applies only to samples taken after fasting for at least 8 hours.  Glucose, capillary     Status: Abnormal   Collection Time: 12/05/22  3:12 PM  Result Value Ref Range   Glucose-Capillary 246 (H) 70 - 99 mg/dL    Comment: Glucose reference range applies only to samples taken after fasting for at least 8 hours.  Glucose, capillary     Status: Abnormal   Collection Time: 12/05/22  9:06 PM  Result Value Ref Range   Glucose-Capillary 268 (H) 70 - 99 mg/dL    Comment: Glucose reference range applies only to samples taken after fasting for at least 8 hours.  CBC     Status: Abnormal   Collection Time: 12/06/22  5:48 AM  Result Value Ref Range   WBC 6.7 4.0 - 10.5 K/uL   RBC 4.95 4.22 - 5.81 MIL/uL   Hemoglobin 12.6 (L) 13.0 - 17.0 g/dL   HCT 36.7 (L) 39.0 - 52.0 %   MCV 74.1 (L) 80.0 - 100.0 fL   MCH 25.5 (L) 26.0 - 34.0 pg   MCHC 34.3 30.0 - 36.0 g/dL   RDW 14.3 11.5 - 15.5 %   Platelets 165 150 - 400 K/uL   nRBC 0.0 0.0 - 0.2 %    Comment: Performed at [facility], [place] [address], [place], [place]  [address]  Heparin level (unfractionated)     Status: Abnormal   Collection Time: 12/06/22  5:48 AM  Result Value Ref Range   Heparin Unfractionated 0.13 (L) 0.30 - 0.70 IU/mL    Comment: (NOTE) The clinical reportable range upper limit is being lowered to >1.10 to align with the FDA approved guidance for the current laboratory assay.  If heparin results are below expected values, and patient dosage has  been confirmed, suggest follow up testing of antithrombin III levels. Performed at [facility], [place] [address] [place], [place] [address]   Renal function panel     Status: Abnormal   Collection Time: 12/06/22  5:48 AM  Result Value Ref Range   Sodium 138 135 - 145 mmol/L   Potassium 3.3 (L) 3.5 - 5.1 mmol/L   Chloride 106 98 - 111 mmol/L   CO2 24 22 - 32 mmol/L   Glucose, Bld 98 70 - 99 mg/dL    Comment: Glucose reference range applies only to samples taken after fasting for at least 8 hours.   BUN 19 8 - 23 mg/dL   Creatinine, Ser 1.33 (H) 0.61 - 1.24 mg/dL   Calcium 9.1 8.9 - 10.3 mg/dL   Phosphorus 1.4 (L) 2.5 - 4.6 mg/dL   Albumin  3.0 (L) 3.5 - 5.0 g/dL   GFR, Estimated 56 (L) >60 mL/min    Comment: (NOTE) Calculated using the CKD-EPI Creatinine Equation (2021)    Anion gap 8 5 - 15    Comment: Performed at [facility] [address] [place], [place] [address]  Magnesium     Status: Abnormal   Collection Time: 12/06/22  5:48 AM  Result Value Ref Range   Magnesium 1.6 (L) 1.7 - 2.4 mg/dL    Comment: Performed at [facility] [address], [place], [place] [address]  Troponin I (High Sensitivity)     Status: Abnormal   Collection Time: 12/06/22  5:48 AM  Result Value Ref Range   Troponin I (High Sensitivity) 737 (HH) <18 ng/L    Comment: CRITICAL VALUE NOTED. VALUE IS CONSISTENT WITH PREVIOUSLY REPORTED/CALLED VALUE (NOTE) Elevated high sensitivity troponin I (hsTnI) values and significant  changes across serial measurements may suggest ACS but  many other  chronic and acute conditions are known to elevate hsTnI results.  Refer to the "Links" section for chest pain algorithms and additional  guidance. Performed at [facility], [place] [address], [place], [place] [address]   Glucose, capillary     Status: Abnormal   Collection Time: 12/06/22  7:12 AM  Result Value Ref Range   Glucose-Capillary 144 (H) 70 - 99 mg/dL    Comment: Glucose reference range applies only to samples taken after fasting for at least 8 hours.  Glucose, capillary     Status: Abnormal   Collection Time: 12/06/22 11:37 AM  Result Value Ref Range   Glucose-Capillary 189 (H) 70 - 99 mg/dL    Comment: Glucose reference range applies only to samples taken after fasting for at least 8 hours.  Glucose, capillary     Status: Abnormal   Collection Time: 12/06/22  3:14 PM  Result Value Ref Range   Glucose-Capillary 283 (H) 70 - 99 mg/dL    Comment: Glucose reference range applies only to samples taken after fasting for at least 8 hours.  Glucose, capillary     Status: Abnormal   Collection Time: 12/06/22  9:02 PM  Result Value Ref Range   Glucose-Capillary 229 (H) 70 - 99 mg/dL    Comment: Glucose reference range applies only to samples taken after fasting for at least 8 hours.  Renal function panel     Status: Abnormal   Collection Time: 12/07/22  3:38 AM  Result Value Ref Range   Sodium 137 135 - 145 mmol/L   Potassium 3.4 (L) 3.5 - 5.1 mmol/L   Chloride 108 98 - 111 mmol/L   CO2 22 22 - 32 mmol/L   Glucose, Bld 260 (H) 70 - 99 mg/dL    Comment: Glucose reference range applies only to samples taken after fasting for at least 8 hours.   BUN 19 8 - 23 mg/dL   Creatinine, Ser 1.37 (H) 0.61 - 1.24 mg/dL   Calcium 8.8 (L) 8.9 - 10.3 mg/dL   Phosphorus 2.5 2.5 - 4.6 mg/dL   Albumin 2.9 (L) 3.5 - 5.0 g/dL   GFR, Estimated 54 (L) >60 mL/min    Comment: (NOTE) Calculated using the CKD-EPI Creatinine Equation (2021)    Anion gap 7 5 - 15    Comment:  Performed at [facility] [address], [place], [place] [address]  Magnesium     Status: None   Collection Time: 12/07/22  3:38 AM  Result Value Ref Range   Magnesium 1.7 1.7 - 2.4 mg/dL    Comment: Performed at [facility], [place] [address] [place], [place] [address]  Glucose, capillary     Status: Abnormal   Collection Time: 12/07/22  8:31 AM  Result Value Ref Range   Glucose-Capillary 274 (H) 70 - 99 mg/dL    Comment: Glucose reference range applies only to samples taken after fasting for at least 8 hours.   Comment 1 Notify RN    Comment 2 Document in Chart   Glucose, capillary     Status: Abnormal   Collection Time: 12/07/22 12:22 PM  Result Value Ref Range   Glucose-Capillary 251 (H) 70 - 99 mg/dL    Comment: Glucose reference range applies only to samples taken after fasting for at least 8 hours.   Comment 1 Notify RN    Comment 2 Document in Chart   Glucose, capillary     Status: Abnormal   Collection Time: 12/07/22  1:30 PM  Result Value Ref Range   Glucose-Capillary 268 (H) 70 - 99 mg/dL    Comment: Glucose reference range applies only to samples taken after fasting for at least 8 hours.  Glucose, capillary     Status: Abnormal   Collection Time: 12/07/22  4:09 PM  Result Value Ref Range   Glucose-Capillary 188 (H) 70 - 99 mg/dL    Comment: Glucose reference range applies only to samples taken after fasting for at least 8 hours.  Sedimentation rate     Status: Abnormal   Collection Time: 12/07/22  4:18 PM  Result Value Ref Range   Sed Rate 27 (H) 0 - 16 mm/hr    Comment: Performed at [facility] [address] [place], [place] [address]  Procalcitonin - Baseline     Status: None   Collection Time: 12/07/22  4:18 PM  Result Value Ref Range   Procalcitonin 0.33 ng/mL    Comment:        Interpretation: PCT (Procalcitonin) <= 0.5 ng/mL: Systemic infection (sepsis) is not likely. Local bacterial infection is possible. (NOTE)       Sepsis PCT  Algorithm           Lower Respiratory Tract                                      Infection PCT Algorithm    ----------------------------     ----------------------------         PCT < 0.25 ng/mL                PCT < 0.10 ng/mL          Strongly encourage             Strongly discourage   discontinuation of antibiotics    initiation of antibiotics    ----------------------------     -----------------------------       PCT 0.25 - 0.50 ng/mL            PCT 0.10 - 0.25 ng/mL               OR       >80% decrease in PCT            Discourage initiation of  antibiotics      Encourage discontinuation           of antibiotics    ----------------------------     -----------------------------         PCT >= 0.50 ng/mL              PCT 0.26 - 0.50 ng/mL               AND        <80% decrease in PCT             Encourage initiation of                                             antibiotics       Encourage continuation           of antibiotics    ----------------------------     -----------------------------        PCT >= 0.50 ng/mL                  PCT > 0.50 ng/mL               AND         increase in PCT                  Strongly encourage                                      initiation of antibiotics    Strongly encourage escalation           of antibiotics                                     -----------------------------                                           PCT <= 0.25 ng/mL                                                 OR                                        > 80% decrease in PCT                                      Discontinue / Do not initiate                                             antibiotics  Performed at [facility], [place] [address], [place], [place] [address]   Uric acid     Status: None   Collection Time:  12/07/22  4:18 PM  Result Value Ref Range   Uric Acid, Serum 4.3 3.7 - 8.6 mg/dL    Comment: Performed at  [facility] [address], [place], [place] [address]  C-reactive protein     Status: Abnormal   Collection Time: 12/07/22  4:18 PM  Result Value Ref Range   CRP 11.5 (H) <1.0 mg/dL    Comment: Performed at [facility] [address] [place], [place] [address]  Body fluid culture w Gram Stain (indicate joint specimen source)     Status: None   Collection Time: 12/07/22  7:25 PM   Specimen: Synovium; Body Fluid  Result Value Ref Range   Specimen Description SYNOVIAL    Special Requests KNEE TUBES NOT CAPPED POSSIBLE CONTAMINATION    Gram Stain      MODERATE WBC PRESENT, PREDOMINANTLY PMN NO ORGANISMS SEEN    Culture      NO GROWTH 3 DAYS Performed at [facility], [address] [place], [place] [address]    Report Status 12/11/2022 FINAL   Synovial cell count + diff, w/ crystals     Status: Abnormal   Collection Time: 12/07/22  7:25 PM  Result Value Ref Range   Color, Synovial YELLOW YELLOW   Appearance-Synovial CLOUDY (A) CLEAR   Crystals, Fluid NO CRYSTALS SEEN    WBC, Synovial 6,150 (H) 0 - 200 /cu mm   Neutrophil, Synovial 81 (H) 0 - 25 %   Lymphocytes-Synovial Fld 4 0 - 20 %   Monocyte-Macrophage-Synovial Fluid 15 (L) 50 - 90 %   Eosinophils-Synovial 0 0 - 1 %    Comment: Performed at [facility] [address] [place], [place] [address]  Glucose, capillary     Status: Abnormal   Collection Time: 12/07/22  9:02 PM  Result Value Ref Range   Glucose-Capillary 356 (H) 70 - 99 mg/dL    Comment: Glucose reference range applies only to samples taken after fasting for at least 8 hours.   Comment 1 Notify RN   Glucose, capillary     Status: Abnormal   Collection Time: 12/08/22  8:36 AM  Result Value Ref Range   Glucose-Capillary 325 (H) 70 - 99 mg/dL    Comment: Glucose reference range applies only to samples taken after fasting for at least 8 hours.   Comment 1 Notify RN    Comment 2 Document in Chart   Glucose, capillary     Status:  Abnormal   Collection Time: 12/08/22 11:50 AM  Result Value Ref Range   Glucose-Capillary 261 (H) 70 - 99 mg/dL    Comment: Glucose reference range applies only to samples taken after fasting for at least 8 hours.  Glucose, capillary     Status: Abnormal   Collection Time: 12/08/22  3:55 PM  Result Value Ref Range   Glucose-Capillary 168 (H) 70 - 99 mg/dL    Comment: Glucose reference range applies only to samples taken after fasting for at least 8 hours.   Comment 1 Notify RN    Comment 2 Document in Chart   Glucose, capillary     Status: Abnormal   Collection Time: 12/08/22  8:05 PM  Result Value Ref Range   Glucose-Capillary 184 (H) 70 - 99 mg/dL    Comment: Glucose reference range applies only to samples taken after fasting for at least 8 hours.  Glucose, capillary     Status: Abnormal   Collection Time: 12/09/22  7:37 AM  Result Value Ref Range   Glucose-Capillary 174 (H) 70 - 99 mg/dL    Comment: Glucose reference range applies only to samples taken after fasting for at least 8 hours.  Glucose, capillary     Status: Abnormal   Collection Time: 12/09/22 11:14 AM  Result Value Ref Range   Glucose-Capillary 248 (H) 70 - 99 mg/dL    Comment: Glucose reference range applies only to samples taken after fasting for at least 8 hours.  Glucose, capillary     Status: Abnormal   Collection Time: 12/09/22  3:28 PM  Result Value Ref Range   Glucose-Capillary 210 (H) 70 - 99 mg/dL    Comment: Glucose reference range applies only to samples taken after fasting for at least 8 hours.  Glucose, capillary     Status: Abnormal   Collection Time: 12/09/22  8:16 PM  Result Value Ref Range   Glucose-Capillary 238 (H) 70 - 99 mg/dL    Comment: Glucose reference range applies only to samples taken after fasting for at least 8 hours.  Renal function panel     Status: Abnormal   Collection Time: 12/10/22  5:03 AM  Result Value Ref Range   Sodium 139 135 - 145 mmol/L   Potassium 3.0 (L) 3.5 - 5.1  mmol/L   Chloride 108 98 - 111 mmol/L   CO2 23 22 - 32 mmol/L   Glucose, Bld 76 70 - 99 mg/dL    Comment: Glucose reference range applies only to samples taken after fasting for at least 8 hours.   BUN 20 8 - 23 mg/dL   Creatinine, Ser 1.28 (H) 0.61 - 1.24 mg/dL   Calcium 8.6 (L) 8.9 - 10.3 mg/dL   Phosphorus 3.7 2.5 - 4.6 mg/dL   Albumin 2.6 (L) 3.5 - 5.0 g/dL   GFR, Estimated 58 (L) >60 mL/min    Comment: (NOTE) Calculated using the CKD-EPI Creatinine Equation (2021)    Anion gap 8 5 - 15    Comment: Performed at [facility] [address], [place], [place] [address]  Magnesium     Status: None   Collection Time: 12/10/22  5:03 AM  Result Value Ref Range   Magnesium 1.7 1.7 - 2.4 mg/dL    Comment: Performed at [facility] [address] [place], [place] [address]  CBC with Differential/Platelet     Status: Abnormal   Collection Time: 12/10/22  5:03 AM  Result Value Ref Range   WBC 4.9 4.0 - 10.5 K/uL   RBC 4.86 4.22 - 5.81 MIL/uL   Hemoglobin 12.6 (L) 13.0 - 17.0 g/dL   HCT 35.3 (L) 39.0 - 52.0 %   MCV 72.6 (L) 80.0 - 100.0 fL   MCH 25.9 (L) 26.0 - 34.0 pg   MCHC 35.7 30.0 - 36.0 g/dL   RDW 14.5 11.5 - 15.5 %   Platelets 297 150 - 400 K/uL   nRBC 0.0 0.0 - 0.2 %   Neutrophils Relative % 40 %   Neutro Abs 2.0 1.7 - 7.7 K/uL   Lymphocytes Relative 36 %   Lymphs Abs 1.8 0.7 - 4.0 K/uL   Monocytes Relative 17 %   Monocytes Absolute 0.8 0.1 - 1.0 K/uL   Eosinophils Relative 5 %   Eosinophils Absolute 0.2 0.0 - 0.5 K/uL   Basophils Relative 1 %   Basophils Absolute 0.0 0.0 - 0.1 K/uL   WBC Morphology MORPHOLOGY UNREMARKABLE    RBC Morphology MORPHOLOGY  UNREMARKABLE    Smear Review Normal platelet morphology    Immature Granulocytes 1 %   Abs Immature Granulocytes 0.05 0.00 - 0.07 K/uL    Comment: Performed at [facility], [place] [address] [place], [place] [address]  Glucose, capillary     Status: Abnormal   Collection Time: 12/10/22  8:04 AM   Result Value Ref Range   Glucose-Capillary 120 (H) 70 - 99 mg/dL    Comment: Glucose reference range applies only to samples taken after fasting for at least 8 hours.  Glucose, capillary     Status: Abnormal   Collection Time: 12/10/22 11:27 AM  Result Value Ref Range   Glucose-Capillary 234 (H) 70 - 99 mg/dL    Comment: Glucose reference range applies only to samples taken after fasting for at least 8 hours.  MYOCARDIAL PERFUSION IMAGING     Status: None   Collection Time: 12/17/22 12:32 PM  Result Value Ref Range   Rest Nuclear Isotope Dose 9.7 mCi   Stress Nuclear Isotope Dose 31.5 mCi   Rest HR 72.0 bpm   Rest BP 149/101 mmHg   Peak HR 88 bpm   Peak BP 157/102 mmHg   SSS 1.0    SRS 0.0    SDS 1.0    TID 0.91    LV sys vol 53.0 mL   LV dias vol 106.0 62 - 150 mL   Nuc Stress EF 50 %   ST Depression (mm) 0 mm    Assessment/Plan: 1. Encounter to establish care - Follow up for CPE or sooner if needed  2. Type 1 diabetes mellitus with unspecified complications [facility]) - Per endocrinology   3. Hyperlipidemia due to type 1 diabetes mellitus (HCC) - Continue on Atrvastatin 80 mg  4. Glaucoma, unspecified glaucoma type, unspecified laterality - Per opthalmology    Dorothyann Peng, NP

## 2023-01-10 ENCOUNTER — Ambulatory Visit: Payer: Medicare HMO | Admitting: Endocrinology

## 2023-01-14 DIAGNOSIS — I214 Non-ST elevation (NSTEMI) myocardial infarction: Secondary | ICD-10-CM | POA: Diagnosis not present

## 2023-01-14 DIAGNOSIS — K219 Gastro-esophageal reflux disease without esophagitis: Secondary | ICD-10-CM | POA: Diagnosis not present

## 2023-01-14 DIAGNOSIS — I129 Hypertensive chronic kidney disease with stage 1 through stage 4 chronic kidney disease, or unspecified chronic kidney disease: Secondary | ICD-10-CM | POA: Diagnosis not present

## 2023-01-14 DIAGNOSIS — K529 Noninfective gastroenteritis and colitis, unspecified: Secondary | ICD-10-CM | POA: Diagnosis not present

## 2023-01-14 DIAGNOSIS — E1322 Other specified diabetes mellitus with diabetic chronic kidney disease: Secondary | ICD-10-CM | POA: Diagnosis not present

## 2023-01-14 DIAGNOSIS — N1831 Chronic kidney disease, stage 3a: Secondary | ICD-10-CM | POA: Diagnosis not present

## 2023-01-14 DIAGNOSIS — E785 Hyperlipidemia, unspecified: Secondary | ICD-10-CM | POA: Diagnosis not present

## 2023-01-14 DIAGNOSIS — N179 Acute kidney failure, unspecified: Secondary | ICD-10-CM | POA: Diagnosis not present

## 2023-01-14 DIAGNOSIS — H409 Unspecified glaucoma: Secondary | ICD-10-CM | POA: Diagnosis not present

## 2023-01-16 ENCOUNTER — Ambulatory Visit: Payer: Medicare HMO | Admitting: Dietician

## 2023-01-21 ENCOUNTER — Ambulatory Visit: Payer: Medicare HMO | Admitting: Nutrition

## 2023-01-21 DIAGNOSIS — I129 Hypertensive chronic kidney disease with stage 1 through stage 4 chronic kidney disease, or unspecified chronic kidney disease: Secondary | ICD-10-CM | POA: Diagnosis not present

## 2023-01-21 DIAGNOSIS — K529 Noninfective gastroenteritis and colitis, unspecified: Secondary | ICD-10-CM | POA: Diagnosis not present

## 2023-01-21 DIAGNOSIS — N179 Acute kidney failure, unspecified: Secondary | ICD-10-CM | POA: Diagnosis not present

## 2023-01-21 DIAGNOSIS — E785 Hyperlipidemia, unspecified: Secondary | ICD-10-CM | POA: Diagnosis not present

## 2023-01-21 DIAGNOSIS — H409 Unspecified glaucoma: Secondary | ICD-10-CM | POA: Diagnosis not present

## 2023-01-21 DIAGNOSIS — E1322 Other specified diabetes mellitus with diabetic chronic kidney disease: Secondary | ICD-10-CM | POA: Diagnosis not present

## 2023-01-21 DIAGNOSIS — K219 Gastro-esophageal reflux disease without esophagitis: Secondary | ICD-10-CM | POA: Diagnosis not present

## 2023-01-21 DIAGNOSIS — I214 Non-ST elevation (NSTEMI) myocardial infarction: Secondary | ICD-10-CM | POA: Diagnosis not present

## 2023-01-21 DIAGNOSIS — N1831 Chronic kidney disease, stage 3a: Secondary | ICD-10-CM | POA: Diagnosis not present

## 2023-01-28 ENCOUNTER — Encounter: Payer: Medicare HMO | Attending: Endocrinology | Admitting: Nutrition

## 2023-01-28 DIAGNOSIS — N1831 Chronic kidney disease, stage 3a: Secondary | ICD-10-CM | POA: Insufficient documentation

## 2023-01-28 DIAGNOSIS — Z794 Long term (current) use of insulin: Secondary | ICD-10-CM | POA: Diagnosis not present

## 2023-01-28 DIAGNOSIS — E1122 Type 2 diabetes mellitus with diabetic chronic kidney disease: Secondary | ICD-10-CM | POA: Insufficient documentation

## 2023-01-28 NOTE — Progress Notes (Signed)
Patient is here with his wife to learn how to use the Dexcom G6 CGM.  This was linked to his phone and to [facility] and [facility].   He inserted a sensor into his right upper outer arm and attached the transmitter and linked this to his phone.   He will return to start his pump next week.

## 2023-01-28 NOTE — Patient Instructions (Signed)
Read over [facility] Apply a new sensor every 10 days Apply and new transmitter every 3 months.

## 2023-01-29 DIAGNOSIS — N179 Acute kidney failure, unspecified: Secondary | ICD-10-CM | POA: Diagnosis not present

## 2023-01-29 DIAGNOSIS — N1831 Chronic kidney disease, stage 3a: Secondary | ICD-10-CM | POA: Diagnosis not present

## 2023-01-29 DIAGNOSIS — H409 Unspecified glaucoma: Secondary | ICD-10-CM | POA: Diagnosis not present

## 2023-01-29 DIAGNOSIS — E1322 Other specified diabetes mellitus with diabetic chronic kidney disease: Secondary | ICD-10-CM | POA: Diagnosis not present

## 2023-01-29 DIAGNOSIS — I214 Non-ST elevation (NSTEMI) myocardial infarction: Secondary | ICD-10-CM | POA: Diagnosis not present

## 2023-01-29 DIAGNOSIS — E785 Hyperlipidemia, unspecified: Secondary | ICD-10-CM | POA: Diagnosis not present

## 2023-01-29 DIAGNOSIS — K529 Noninfective gastroenteritis and colitis, unspecified: Secondary | ICD-10-CM | POA: Diagnosis not present

## 2023-01-29 DIAGNOSIS — I129 Hypertensive chronic kidney disease with stage 1 through stage 4 chronic kidney disease, or unspecified chronic kidney disease: Secondary | ICD-10-CM | POA: Diagnosis not present

## 2023-01-29 DIAGNOSIS — K219 Gastro-esophageal reflux disease without esophagitis: Secondary | ICD-10-CM | POA: Diagnosis not present

## 2023-02-03 ENCOUNTER — Encounter: Payer: Medicare HMO | Admitting: Nutrition

## 2023-02-03 DIAGNOSIS — N1831 Chronic kidney disease, stage 3a: Secondary | ICD-10-CM

## 2023-02-04 ENCOUNTER — Ambulatory Visit: Payer: Medicare HMO | Admitting: Endocrinology

## 2023-02-04 ENCOUNTER — Encounter: Payer: Self-pay | Admitting: Endocrinology

## 2023-02-04 ENCOUNTER — Encounter: Payer: Medicare HMO | Admitting: Family Medicine

## 2023-02-04 ENCOUNTER — Encounter: Payer: Medicare HMO | Admitting: Nutrition

## 2023-02-04 VITALS — BP 136/88 | HR 72 | Ht 75.5 in | Wt 208.0 lb

## 2023-02-04 DIAGNOSIS — E1065 Type 1 diabetes mellitus with hyperglycemia: Secondary | ICD-10-CM

## 2023-02-04 DIAGNOSIS — N179 Acute kidney failure, unspecified: Secondary | ICD-10-CM | POA: Diagnosis not present

## 2023-02-04 DIAGNOSIS — K219 Gastro-esophageal reflux disease without esophagitis: Secondary | ICD-10-CM | POA: Diagnosis not present

## 2023-02-04 DIAGNOSIS — H409 Unspecified glaucoma: Secondary | ICD-10-CM | POA: Diagnosis not present

## 2023-02-04 DIAGNOSIS — I214 Non-ST elevation (NSTEMI) myocardial infarction: Secondary | ICD-10-CM | POA: Diagnosis not present

## 2023-02-04 DIAGNOSIS — I129 Hypertensive chronic kidney disease with stage 1 through stage 4 chronic kidney disease, or unspecified chronic kidney disease: Secondary | ICD-10-CM | POA: Diagnosis not present

## 2023-02-04 DIAGNOSIS — E1322 Other specified diabetes mellitus with diabetic chronic kidney disease: Secondary | ICD-10-CM | POA: Diagnosis not present

## 2023-02-04 DIAGNOSIS — E785 Hyperlipidemia, unspecified: Secondary | ICD-10-CM | POA: Diagnosis not present

## 2023-02-04 DIAGNOSIS — N1831 Chronic kidney disease, stage 3a: Secondary | ICD-10-CM

## 2023-02-04 DIAGNOSIS — K529 Noninfective gastroenteritis and colitis, unspecified: Secondary | ICD-10-CM | POA: Diagnosis not present

## 2023-02-04 NOTE — Progress Notes (Deleted)
Could not reach pt for appt. Both nurse assistant an I called several times, LM to call to reschedule.

## 2023-02-04 NOTE — Progress Notes (Signed)
Patient is here with his wife to be trained on the OmniPod 5 insulin pump.  Wife needed assistance with linking to glooko.  There was a problem with this, and Margreta Journey was notified, (the pump representative) with a message to see if she can fix this.   Husband has severe memory loss, but wife understood how to bolus, do correction doses and how to fill, apply and start a pod.  Settings were put in per calculations:  basal rate: 1.15, I/C 1 (written instructions given for 6u ac meals).  We discussed the need to increase this dose by 1-2 u when eating more/or to decrease this dose when eating less, and wife reported good understanding of this.  ISF: 40, timing 4 hours, target: 120 with correction over 130.  Wife requested this dosage.  Will review this tomorrow with Dr. Dwyane Dee.   Patient filled pod with Novolog insulin and attached this to his right abdomen.  The dexcom was linked to his PDM and it read "High" due to no Lantus last night or this AM.  Meter reading was 59m/dl.  A Correction dose was done at 10AM, and again at 11AM.  Discussed that it will take time for basal rate to start working and to bring his blood sugar down.  They were told to call the office if readings remain over 250.  We reviewed again the need to bolus before all meals, put in a blood sugar reading with this bolus, and do correction doses whenever blood sugar is over 250.   Pump was put into the automated mode, and I explained that this will take at least 2 pod changes before it begins to work well to control his blood sugar readings.  They were encouraged to read the starter booklet tonight, and to call OmniPod if questions or problems concerning pump usage. They will return tomorrow to review checklist and evaluate how they are doing.  Front desk notified to try to work him in with Dr. KDwyane Deethis week, if opening becomes available.

## 2023-02-04 NOTE — Progress Notes (Signed)
Patient and wife are here to finish training on how to use the OmniPod5 insulin pump.  They appear to be bolusing for meals correctly, but no giving insulin for snacking between meals.  We reviewed how to do this, and also alerts, alarms, high blood sugar protocol and sick day guidelines.  We also reviewed again how to change the sensors, and how to change the pod tomorrow.  They signed the checklist as understanding all topics and had no final questions.  Another email sent to [facility] from [place] to please link this patient to [facility]

## 2023-02-04 NOTE — Patient Instructions (Signed)
Bolus before all meals, by putting in "6" into the carb box and "use sensor" to add blood sugar reading.   Do corrections doses whenever blood sugar readings are over 250 Read over starter booklet Call Dexcom help line, if questions about how to use the dexcom Call OmniPod if questions about how to use the OmniPod

## 2023-02-04 NOTE — Patient Instructions (Signed)
8 units bolus for Bfst and 6 at lunch, supper  Adjust 1-2 Units for more or less carbs  Add protein and sweetener at Bfst  Correction bolus for sugars 220

## 2023-02-04 NOTE — Patient Instructions (Signed)
Change dexcom sensor on your phone Call Dexcom help line if questions on how to do this Change pod every 3 days on your PDM controller.  Call the OmniPod help line if questions on how to do this.

## 2023-02-04 NOTE — Progress Notes (Signed)
Patient ID: Christopher [name], male   DOB: 1946/11/22, 77 y.o.   MRN: [id number]    Chief complaint : Follow up of Type 1 Diabetes  History of Present Illness:          Date of diagnosis: 2010  Prior history:  Previous A1c range has been 8.4-11.6   Therapy: insulin since 2013.   DKA: Twice (2023) Severe hypoglycemia: last episode was in 11/2022.  Non-insulin hypoglycemic drugs: Previously had been on Trulicity and Victoza which were stopped Previous insulins tried include Levemir and NPH  INSULIN regimen : Previously: Lantus 38--20 units bid Novolog FlexPen before meals: 10 units before meals  Pump settings:  BASAL rate: 1.15, I/C ratio 1: 1 with mealtime doses 6 units   ISF: 40, timing 4 hours, target: 120 with correction over 130.   Recent history:     A1c is at 8.9 last  He is starting the OmniPod insulin pump and has been on it for about 24 hours now  He is not connected but his bolus data was reviewed from the PDM directly So far patient has been able to take his boluses with his wife's help Not counting carbohydrates in his pump but taking the fixed amount of 6 units bolus for every meal Review of his blood sugar patterns from his Dexcom shows the following Overnight blood sugars have been fairly steady in the low 100 range Postprandial blood sugars were as follows: After lunch blood sugar was under 180 and had about 1 carbohydrate serving only After dinner blood sugar went up to about 170 but after about 2 hours started increasing further to over 200 and subsequently decreased gradually overnight Today blood sugar after breakfast was 318; he usually has oatmeal with 1 tablespoon of brown sugar and no other protein Also on the last few days blood sugars appear to be the highest after breakfast Bolus has been taking right before eating No hypoglycemia  Previous CGM data: Data  CGM use % of time 55  2-week average/GV 193/52  Time in range       45%  % Time Above 180  24  % Time above 250 23  % Time Below 70 8     Symptoms of hypoglycemia: Weakness, shakiness        Self-care: The diet that the patient has been following is: Relatively low fat and carbohydrate May have sugar-free cookies at bedtime Breakfast may be oatmeal usually         Exercise: No consistent activity.          Most recent dietitian/nurse educator visit :    04/2022       Diabetes labs:  Lab Results  Component Value Date   HGBA1C 8.9 (H) 12/05/2022   HGBA1C 10.1 (H) 08/12/2022   HGBA1C 9.5 (H) 05/20/2022   Lab Results  Component Value Date   MICROALBUR <0.7 05/20/2022   LDLCALC 88 05/20/2022   CREATININE 1.28 (H) 12/10/2022    No visits with results within 1 Week(s) from this visit.  Latest known visit with results is:  Appointment on 12/17/2022  Component Date Value Ref Range Status   Rest Nuclear Isotope Dose 12/17/2022 9.7  mCi Final   Stress Nuclear Isotope Dose 12/17/2022 31.5  mCi Final   Rest HR 12/17/2022 72.0  bpm Final   Rest BP 12/17/2022 149/101  mmHg Final   Peak HR 12/17/2022 88  bpm Final   Peak BP 12/17/2022 157/102  mmHg Final  SSS 12/17/2022 1.0   Final   SRS 12/17/2022 0.0   Final   SDS 12/17/2022 1.0   Final   TID 12/17/2022 0.91   Final   LV sys vol 12/17/2022 53.0  mL Final   LV dias vol 12/17/2022 106.0  62 - 150 mL Final   Nuc Stress EF 12/17/2022 50  % Final   ST Depression (mm) 12/17/2022 0  mm Final    Allergies as of 02/04/2023   No Known Allergies      Medication List        Accurate as of February 04, 2023  2:50 PM. If you have any questions, ask your nurse or doctor.          atorvastatin 80 MG tablet Commonly known as: LIPITOR Take 1 tablet (80 mg total) by mouth at bedtime.   BD Veo Insulin Syringe U/F 31G X 15/64" 1 ML Misc Generic drug: Insulin Syringe-Needle U-100 USE  SYRINGE TWICE DAILY   brimonidine 0.15 % ophthalmic solution Commonly known as: ALPHAGAN Place 1 drop into both eyes 2 (two) times  daily.   Dexcom G6 Transmitter Misc Use every 3 months   docusate sodium 100 MG capsule Commonly known as: COLACE Take 1 capsule (100 mg total) by mouth 2 (two) times daily. What changed:  when to take this reasons to take this   dorzolamide-timolol 2-0.5 % ophthalmic solution Commonly known as: COSOPT Place 1 drop into both eyes 2 (two) times daily.   FreeStyle Libre 2 Reader [place] 1 each by Does not apply route daily.   Dexcom G7 Receiver Devi Use to check blood sugar   FreeStyle Libre 2 Sensor Misc 1 Device by Does not apply route every 14 (fourteen) days.   Dexcom G6 Sensor Misc Change every 10 days   Insulin Pen Needle 32G X 4 MM Misc Use as instructed to administer insulin 4-5X daily. E10.65   Lantus SoloStar 100 UNIT/ML Solostar Pen Generic drug: insulin glargine 30 units in the morning, 10 units in the evening What changed: additional instructions   Multivitamin Adults 50+ Tabs Take 1 tablet by mouth daily.   NovoLOG FlexPen 100 UNIT/ML FlexPen Generic drug: insulin aspart Inject 4 Units into the skin 3 (three) times daily with meals.   insulin aspart 100 UNIT/ML injection Commonly known as: novoLOG Up to 50 units daily in insulin pump   Omnipod 5 G6 Pods (Gen 5) Misc SMARTSIG:SUB-Q Every 3 Days   Omnipod 5 G6 Intro (Gen 5) Kit Inject into the skin.   onetouch ultrasoft lancets Use to check blood sugar 3 times a day   OneTouch Verio test strip Generic drug: glucose blood 1 each by Other route 2 (two) times daily. And lancets 2/day   polyethylene glycol 17 g packet Commonly known as: MIRALAX / GLYCOLAX Take 17 g by mouth daily. What changed:  when to take this reasons to take this        Allergies: No Known Allergies  Past Medical History:  Diagnosis Date   Diabetes mellitus type II    GERD (gastroesophageal reflux disease)    Glaucoma    Hyperlipidemia    Hypertension     Past Surgical History:  Procedure Laterality Date    COLONOSCOPY     HERNIA REPAIR     PATELLA FRACTURE SURGERY  1993    Family History  Problem Relation Age of Onset   Alcohol abuse Father    Diabetes Sister    Diabetes Brother  Diabetes Brother    Colon cancer Neg Hx    Esophageal cancer Neg Hx    Stomach cancer Neg Hx    Rectal cancer Neg Hx     Social History:  reports that he has never smoked. He has never used smokeless tobacco. He reports that he does not drink alcohol and does not use drugs.    Review of Systems:   Blood pressure: Usually controlled, only on 5 mg lisinopril  BP Readings from Last 3 Encounters:  02/04/23 136/88  01/09/23 110/70  12/20/22 110/70     Lipids: He has been treated with 80 mg Lipitor prescribed by other physicians  Lab Results  Component Value Date   CHOL 153 05/20/2022   HDL 53.80 05/20/2022   LDLCALC 88 05/20/2022   LDLDIRECT 135.3 06/17/2013   TRIG 57.0 05/20/2022   CHOLHDL 3 05/20/2022     Diabetes complications: None, no microalbuminuria Last eye exam 2/23 Last foot exam 10/22    Physical Examination:  BP 136/88 (BP Location: Left Arm, Patient Position: Sitting, Cuff Size: Normal)   Pulse 72   Ht 6' 3.5" (1.918 m)   Wt 208 lb (94.3 kg)   SpO2 97%   BMI 25.66 kg/m         ASSESSMENT/PLAN   Diabetes type 1 with persistently poor control   A1c consistently over 8%  He is just starting the OmniPod 5 insulin pump Has been able to be in the automated mode No difficulty using the pump since yesterday    Recommendations:  8 units bolus for breakfast and continue 6 at lunch, supper Try to bolus 10 to 15-minute before starting to eat Adjust 1-2 Units up or down for higher or less carbohydrates at mealtimes especially at dinnertime  Add protein either dairy product or lead meat and use a sweetener at breakfast instead of sugar  Correction bolus for sugars that go up over 220 in between meals No change in bolus settings as yet Continue target of  120   Patient Instructions  8 units bolus for Bfst and 6 at lunch, supper  Adjust 1-2 Units for more or less carbs  Add protein and sweetener at Bfst  Correction bolus for sugars 220                      Christopher [name] 02/04/2023, 2:50 PM

## 2023-02-11 DIAGNOSIS — E785 Hyperlipidemia, unspecified: Secondary | ICD-10-CM | POA: Diagnosis not present

## 2023-02-11 DIAGNOSIS — I129 Hypertensive chronic kidney disease with stage 1 through stage 4 chronic kidney disease, or unspecified chronic kidney disease: Secondary | ICD-10-CM | POA: Diagnosis not present

## 2023-02-11 DIAGNOSIS — I214 Non-ST elevation (NSTEMI) myocardial infarction: Secondary | ICD-10-CM | POA: Diagnosis not present

## 2023-02-11 DIAGNOSIS — E1322 Other specified diabetes mellitus with diabetic chronic kidney disease: Secondary | ICD-10-CM | POA: Diagnosis not present

## 2023-02-11 DIAGNOSIS — K219 Gastro-esophageal reflux disease without esophagitis: Secondary | ICD-10-CM | POA: Diagnosis not present

## 2023-02-11 DIAGNOSIS — N1831 Chronic kidney disease, stage 3a: Secondary | ICD-10-CM | POA: Diagnosis not present

## 2023-02-11 DIAGNOSIS — N179 Acute kidney failure, unspecified: Secondary | ICD-10-CM | POA: Diagnosis not present

## 2023-02-11 DIAGNOSIS — H409 Unspecified glaucoma: Secondary | ICD-10-CM | POA: Diagnosis not present

## 2023-02-11 DIAGNOSIS — K529 Noninfective gastroenteritis and colitis, unspecified: Secondary | ICD-10-CM | POA: Diagnosis not present

## 2023-02-19 ENCOUNTER — Encounter: Payer: Medicare HMO | Admitting: Adult Health

## 2023-02-20 ENCOUNTER — Encounter: Payer: Medicare HMO | Admitting: Adult Health

## 2023-02-24 ENCOUNTER — Telehealth: Payer: Self-pay | Admitting: Nutrition

## 2023-02-24 NOTE — Telephone Encounter (Signed)
Patient says the pump is very confusing for him, he is wasting a lot of insulin, and wants to go back on the injections.  I scheduled him another appointment tomorrow and he was told to fill the pod with just 1.45m today.  He agreed to do this and will see me tomorrow

## 2023-02-25 ENCOUNTER — Encounter: Payer: Medicare HMO | Attending: Endocrinology | Admitting: Nutrition

## 2023-02-25 ENCOUNTER — Ambulatory Visit: Payer: Medicare HMO | Admitting: Endocrinology

## 2023-02-25 ENCOUNTER — Encounter: Payer: Self-pay | Admitting: Endocrinology

## 2023-02-25 VITALS — BP 144/88 | HR 80 | Ht 75.5 in | Wt 209.6 lb

## 2023-02-25 DIAGNOSIS — E1165 Type 2 diabetes mellitus with hyperglycemia: Secondary | ICD-10-CM | POA: Insufficient documentation

## 2023-02-25 DIAGNOSIS — E1065 Type 1 diabetes mellitus with hyperglycemia: Secondary | ICD-10-CM

## 2023-02-25 MED ORDER — OMNIPOD 5 DEXG7G6 PODS GEN 5 MISC: 5 refills | Status: DC

## 2023-02-25 NOTE — Progress Notes (Signed)
Patient ID: Christopher [name], male   DOB: 04/23/1946, 77 y.o.   MRN: [id number]    Chief complaint : Follow up of Type 1 Diabetes  History of Present Illness:          Date of diagnosis: 2010  Prior history:  Previous A1c range has been 8.4-11.6   Therapy: insulin since 2013.   DKA: Twice (2023) Severe hypoglycemia: last episode was in 11/2022.  Non-insulin hypoglycemic drugs: Previously had been on Trulicity and Victoza which were stopped Previous insulins tried include Levemir and NPH  INSULIN regimen : Previously: Lantus 38--20 units bid Novolog FlexPen before meals: 10 units before meals  Pump settings:  BASAL rate: 1.15, I/C ratio 1: 1 with mealtime doses 8 units breakfast and 6 units before lunch and dinner  ISF: 40, timing 4 hours, target: 120 with correction over 130.   Recent history:     A1c is at 8.9 last Recent GMI 7.5  He is continuing the OmniPod insulin pump   He is not connected to evaluate his pump activity but his Dexcom data was analyzed as below Troubleshooting was done with the help of the nurse educator today Although he is taking his boluses reportedly fairly consistently he appears to be getting much higher readings after eating oatmeal in the morning  And also periodically will have higher readings after lunch or dinner based on his diet Generally not adjusting lunch or dinner time doses based on carbohydrate intake Overall control is much better than when he was on injections As before his wife is mostly helping him monitor his glucose and adjust his insulin No hypoglycemia  TIME IN RANGE = 58% Above 180 = 30% +12%  Previous CGM data: Data  CGM use % of time 55  2-week average/GV 193/52  Time in range       45%  % Time Above 180 24  % Time above 250 23  % Time Below 70 8     Symptoms of hypoglycemia: Weakness, shakiness        Self-care: The diet that the patient has been following is: Relatively low fat and carbohydrate May have  sugar-free cookies at bedtime Breakfast may be oatmeal usually         Exercise: No consistent activity.          Most recent dietitian/nurse educator visit :    04/2022       Diabetes labs:  Lab Results  Component Value Date   HGBA1C 8.9 (H) 12/05/2022   HGBA1C 10.1 (H) 08/12/2022   HGBA1C 9.5 (H) 05/20/2022   Lab Results  Component Value Date   MICROALBUR <0.7 05/20/2022   LDLCALC 88 05/20/2022   CREATININE 1.28 (H) 12/10/2022    No visits with results within 1 Week(s) from this visit.  Latest known visit with results is:  Appointment on 12/17/2022  Component Date Value Ref Range Status   Rest Nuclear Isotope Dose 12/17/2022 9.7  mCi Final   Stress Nuclear Isotope Dose 12/17/2022 31.5  mCi Final   Rest HR 12/17/2022 72.0  bpm Final   Rest BP 12/17/2022 149/101  mmHg Final   Peak HR 12/17/2022 88  bpm Final   Peak BP 12/17/2022 157/102  mmHg Final   SSS 12/17/2022 1.0   Final   SRS 12/17/2022 0.0   Final   SDS 12/17/2022 1.0   Final   TID 12/17/2022 0.91   Final   LV sys vol 12/17/2022 53.0  mL Final  LV dias vol 12/17/2022 106.0  62 - 150 mL Final   Nuc Stress EF 12/17/2022 50  % Final   ST Depression (mm) 12/17/2022 0  mm Final    Allergies as of 02/25/2023   No Known Allergies      Medication List        Accurate as of February 25, 2023 11:59 PM. If you have any questions, ask your nurse or doctor.          atorvastatin 80 MG tablet Commonly known as: LIPITOR Take 1 tablet (80 mg total) by mouth at bedtime.   BD Veo Insulin Syringe U/F 31G X 15/64" 1 ML Misc Generic drug: Insulin Syringe-Needle U-100 USE  SYRINGE TWICE DAILY   brimonidine 0.15 % ophthalmic solution Commonly known as: ALPHAGAN Place 1 drop into both eyes 2 (two) times daily.   Dexcom G6 Transmitter Misc Use every 3 months   docusate sodium 100 MG capsule Commonly known as: COLACE Take 1 capsule (100 mg total) by mouth 2 (two) times daily. What changed:  when to take  this reasons to take this   dorzolamide-timolol 2-0.5 % ophthalmic solution Commonly known as: COSOPT Place 1 drop into both eyes 2 (two) times daily.   FreeStyle Libre 2 Reader [place] 1 each by Does not apply route daily.   Dexcom G7 Receiver Devi Use to check blood sugar   FreeStyle Libre 2 Sensor Misc 1 Device by Does not apply route every 14 (fourteen) days.   Dexcom G6 Sensor Misc Change every 10 days   Insulin Pen Needle 32G X 4 MM Misc Use as instructed to administer insulin 4-5X daily. E10.65   Lantus SoloStar 100 UNIT/ML Solostar Pen Generic drug: insulin glargine 30 units in the morning, 10 units in the evening What changed: additional instructions   Multivitamin Adults 50+ Tabs Take 1 tablet by mouth daily.   NovoLOG FlexPen 100 UNIT/ML FlexPen Generic drug: insulin aspart Inject 4 Units into the skin 3 (three) times daily with meals.   insulin aspart 100 UNIT/ML injection Commonly known as: novoLOG Up to 50 units daily in insulin pump   Omnipod 5 G6 Intro (Gen 5) Kit Inject into the skin.   Omnipod 5 G6 Pods (Gen 5) Misc SMARTSIG:SUB-Q Every 3 Days   onetouch ultrasoft lancets Use to check blood sugar 3 times a day   OneTouch Verio test strip Generic drug: glucose blood 1 each by Other route 2 (two) times daily. And lancets 2/day   polyethylene glycol 17 g packet Commonly known as: MIRALAX / GLYCOLAX Take 17 g by mouth daily. What changed:  when to take this reasons to take this        Allergies: No Known Allergies  Past Medical History:  Diagnosis Date   Diabetes mellitus type II    GERD (gastroesophageal reflux disease)    Glaucoma    Hyperlipidemia    Hypertension     Past Surgical History:  Procedure Laterality Date   COLONOSCOPY     HERNIA REPAIR     PATELLA FRACTURE SURGERY  1993    Family History  Problem Relation Age of Onset   Alcohol abuse Father    Diabetes Sister    Diabetes Brother    Diabetes Brother     Colon cancer Neg Hx    Esophageal cancer Neg Hx    Stomach cancer Neg Hx    Rectal cancer Neg Hx     Social History:  reports that he has  never smoked. He has never used smokeless tobacco. He reports that he does not drink alcohol and does not use drugs.    Review of Systems:   Blood pressure: Followed by PCP  BP Readings from Last 3 Encounters:  02/25/23 (!) 144/88  02/04/23 136/88  01/09/23 110/70     Lipids: He has been treated with 80 mg Lipitor prescribed by other physicians  Lab Results  Component Value Date   CHOL 153 05/20/2022   HDL 53.80 05/20/2022   LDLCALC 88 05/20/2022   LDLDIRECT 135.3 06/17/2013   TRIG 57.0 05/20/2022   CHOLHDL 3 05/20/2022     Diabetes complications: None, no microalbuminuria Last eye exam 2/23 Last foot exam 10/22    Physical Examination:  BP (!) 144/88 (BP Location: Left Arm, Patient Position: Sitting, Cuff Size: Normal)   Pulse 80   Ht 6' 3.5" (1.918 m)   Wt 209 lb 9.6 oz (95.1 kg)   SpO2 96%   BMI 25.85 kg/m         ASSESSMENT/PLAN   Diabetes type 1 with persistently poor control   A1c previously over 8%  He is recently on the OmniPod 5 insulin pump Has been able to be in the automated mode fairly consistently but has some questions about his sensor and troubleshooting was done with the diabetes educator today With eating high carbohydrate meals in the morning his blood sugars are much higher after breakfast and occasionally with large carbohydrate meals later He has been able to avoid hypoglycemia fairly consistently with the pump compared to shots    Recommendations:  12 units bolus for breakfast and continue 6 at lunch, supper Need to go up or down 2 units at lunch or supper time if eating relatively higher carbohydrate meals or larger meals but reduce it if eating very low carbohydrate meals No change in basal, bolus or target settings as yet Reviewed the amount of insulin to be used for filling up the  part His OmniPod was set up for connecting to the cloud by the nurse educator for download on the next visit Try to bolus 10 to 15-minute before starting to eat    There are no Patient Instructions on file for this visit.                      Reather Littler 02/26/2023, 3:42 PM 0

## 2023-02-25 NOTE — Progress Notes (Signed)
Patient and wife were concerned when the vial of insulin did not hold 200u, and thought they had to throw the vial away.  They were shown how to draw out all of the remaining insulin and then use new vial to finish filling the syringe to 150u.  They reported good understanding of this. Wife is still helping husband to bolus at each meal and to fill the pod.  He ate oatmeal and took only 8u.  Blood sugar now 333.  They were shown how to do a correction dose, and at this time, the PDM said no extra insulin was needed.  Renato Gails he ate 2 biscuits for lunch and only took 3u.  Blood sugar went high after this.  He was told to take 5u for this.  They had no final questions.  Patient was hoping he would wear his pod more than 3 days, and thought he might like to go back on injections.  He was reminded of the need to always carry insulin with him and to take 5 injections every day.  He has changed his mind and they are going to continue this pump for now.

## 2023-02-25 NOTE — Patient Instructions (Signed)
Give a 5u bolus for when eating 2 biscuits.  If blood sugar is still high, give 6u

## 2023-02-26 ENCOUNTER — Telehealth: Payer: Self-pay | Admitting: Nutrition

## 2023-02-26 NOTE — Telephone Encounter (Signed)
Wife says they do not have enough Novolog left until their next prescription refill in April.  Per yesterday's conversation, they were throwing away the vials of insulin that had less than 200u in them, not knowing they can put the needle into a new vial to continue to fill the syringe.   She was reminded that they need to fill the pod with only 160u of Novolog, not 200, and was told how to do this.  They were given the last sample vial of insulin that we had.   She will still not have enough to last her until her 3 months is up..  I will try to get her another vial from the [employer].

## 2023-02-27 ENCOUNTER — Telehealth: Payer: Self-pay | Admitting: Endocrinology

## 2023-02-27 ENCOUNTER — Encounter: Payer: Self-pay | Admitting: Endocrinology

## 2023-02-27 NOTE — Telephone Encounter (Signed)
Patient's wife, Onalee Hua, picked up sample of Novolog insulin in office today.

## 2023-03-11 ENCOUNTER — Other Ambulatory Visit: Payer: Self-pay

## 2023-03-11 MED ORDER — INSULIN ASPART 100 UNIT/ML IJ SOLN: INTRAMUSCULAR | 2 refills | Status: DC

## 2023-03-19 ENCOUNTER — Ambulatory Visit (INDEPENDENT_AMBULATORY_CARE_PROVIDER_SITE_OTHER): Payer: Medicare HMO | Admitting: Adult Health

## 2023-03-19 ENCOUNTER — Encounter: Payer: Self-pay | Admitting: Adult Health

## 2023-03-19 VITALS — BP 138/84 | HR 72 | Temp 97.7°F | Ht 75.0 in | Wt 214.0 lb

## 2023-03-19 DIAGNOSIS — Z125 Encounter for screening for malignant neoplasm of prostate: Secondary | ICD-10-CM | POA: Diagnosis not present

## 2023-03-19 DIAGNOSIS — Z1159 Encounter for screening for other viral diseases: Secondary | ICD-10-CM | POA: Diagnosis not present

## 2023-03-19 DIAGNOSIS — E785 Hyperlipidemia, unspecified: Secondary | ICD-10-CM | POA: Diagnosis not present

## 2023-03-19 DIAGNOSIS — E1059 Type 1 diabetes mellitus with other circulatory complications: Secondary | ICD-10-CM

## 2023-03-19 DIAGNOSIS — H409 Unspecified glaucoma: Secondary | ICD-10-CM

## 2023-03-19 DIAGNOSIS — E108 Type 1 diabetes mellitus with unspecified complications: Secondary | ICD-10-CM

## 2023-03-19 DIAGNOSIS — Z0000 Encounter for general adult medical examination without abnormal findings: Secondary | ICD-10-CM | POA: Diagnosis not present

## 2023-03-19 DIAGNOSIS — E1069 Type 1 diabetes mellitus with other specified complication: Secondary | ICD-10-CM | POA: Diagnosis not present

## 2023-03-19 DIAGNOSIS — I152 Hypertension secondary to endocrine disorders: Secondary | ICD-10-CM

## 2023-03-19 MED ORDER — LISINOPRIL 2.5 MG PO TABS
2.5000 mg | ORAL_TABLET | Freq: Every day | ORAL | 3 refills | Status: DC
Start: 2023-03-19 — End: 2024-06-15

## 2023-03-19 NOTE — Progress Notes (Signed)
Subjective:    Patient ID: Christopher [name], male    DOB: 1946-10-17, 77 y.o.   MRN: [id number]  HPI Patient presents for yearly preventative medicine examination. He is a pleasant 77 year old male who  has a past medical history of Diabetes mellitus type II, GERD (gastroesophageal reflux disease), Glaucoma, Hyperlipidemia, and Hypertension.  Hyperlipidemia due to type 1 DM -currently managed with atorvastatin 80 mg daily. He denies myalgia or fatigue Lab Results  Component Value Date   CHOL 153 05/20/2022   HDL 53.80 05/20/2022   LDLCALC 88 05/20/2022   LDLDIRECT 135.3 06/17/2013   TRIG 57.0 05/20/2022   CHOLHDL 3 05/20/2022    Type 1 DM - uncontrolled - managed by endocrinology.  He has been on insulin therapy since 2013.  He has had a history of DKA x2  in 2023. He has recently been switched to an insulin pump with pump settings at   BASAL rate: 1.15, I/C ratio 1: 1 with mealtime doses 12 units breakfast and 6 units before lunch and dinner  ISF: 40, timing 4 hours, target: 120 with correction over 130  His wife reports that his blood sugars have been 130 or below fasting and overall his blood sugars have been much better controlled. He is no longer having spikes.  Lab Results  Component Value Date   HGBA1C 8.9 (H) 12/05/2022   Glaucoma -  is seen by [facility] - reports pressures are well controlled. He takes Cosopt and Alphagan   All immunizations and health maintenance protocols were reviewed with the patient and needed orders were placed.  Appropriate screening laboratory values were ordered for the patient including screening of hyperlipidemia, renal function and hepatic function. If indicated by BPH, a PSA was ordered.  Medication reconciliation,  past medical history, social history, problem list and allergies were reviewed in detail with the patient  Goals were established with regard to weight loss, exercise, and  diet in compliance with medications Wt Readings  from Last 3 Encounters:  03/19/23 214 lb (97.1 kg)  02/25/23 209 lb 9.6 oz (95.1 kg)  02/04/23 208 lb (94.3 kg)   He is up to date on routine colon cancer screening   Review of Systems  Constitutional: Negative.   HENT: Negative.    Eyes: Negative.   Respiratory: Negative.    Cardiovascular: Negative.   Gastrointestinal: Negative.   Endocrine: Negative.   Genitourinary: Negative.   Musculoskeletal: Negative.   Skin: Negative.   Allergic/Immunologic: Negative.   Neurological: Negative.   Hematological: Negative.   Psychiatric/Behavioral: Negative.    All other systems reviewed and are negative.  Past Medical History:  Diagnosis Date   Diabetes mellitus type II    GERD (gastroesophageal reflux disease)    Glaucoma    Hyperlipidemia    Hypertension     Social History   Socioeconomic History   Marital status: Married    Spouse name: Not on file   Number of children: Not on file   Years of education: Not on file   Highest education level: Not on file  Occupational History   Not on file  Tobacco Use   Smoking status: Never   Smokeless tobacco: Never  Vaping Use   Vaping Use: Never used  Substance and Sexual Activity   Alcohol use: No   Drug use: No   Sexual activity: Not on file  Other Topics Concern   Not on file  Social History Narrative   He enjoys  playing golf    Social Determinants of Health   Financial Resource Strain: Not on file  Food Insecurity: No Food Insecurity (10/06/2022)   Hunger Vital Sign    Worried About Running Out of Food in the Last Year: Never true    Ran Out of Food in the Last Year: Never true  Transportation Needs: No Transportation Needs (10/06/2022)   PRAPARE - Hydrologist (Medical): No    Lack of Transportation (Non-Medical): No  Physical Activity: Sufficiently Active (05/10/2020)   Exercise Vital Sign    Days of Exercise per Week: 5 days    Minutes of Exercise per Session: 60 min  Stress: Not on  file  Social Connections: Not on file  Intimate Partner Violence: Not At Risk (10/06/2022)   Humiliation, Afraid, Rape, and Kick questionnaire    Fear of Current or Ex-Partner: No    Emotionally Abused: No    Physically Abused: No    Sexually Abused: No    Past Surgical History:  Procedure Laterality Date   COLONOSCOPY     HERNIA REPAIR     PATELLA FRACTURE SURGERY  1993    Family History  Problem Relation Age of Onset   Alcohol abuse Father    Diabetes Sister    Diabetes Brother    Diabetes Brother    Colon cancer Neg Hx    Esophageal cancer Neg Hx    Stomach cancer Neg Hx    Rectal cancer Neg Hx     No Known Allergies  Current Outpatient Medications on File Prior to Visit  Medication Sig Dispense Refill   atorvastatin (LIPITOR) 80 MG tablet Take 1 tablet (80 mg total) by mouth at bedtime. 90 tablet 3   brimonidine (ALPHAGAN) 0.15 % ophthalmic solution Place 1 drop into both eyes 2 (two) times daily.     Continuous Blood Gluc Receiver (DEXCOM G7 RECEIVER) DEVI Use to check blood sugar 1 each 0   Continuous Blood Gluc Sensor (DEXCOM G6 SENSOR) MISC Change every 10 days 9 each 2   Continuous Blood Gluc Sensor (FREESTYLE LIBRE 2 SENSOR) MISC 1 Device by Does not apply route every 14 (fourteen) days. 6 each 3   Continuous Blood Gluc Transmit (DEXCOM G6 TRANSMITTER) MISC Use every 3 months 1 each 2   docusate sodium (COLACE) 100 MG capsule Take 1 capsule (100 mg total) by mouth 2 (two) times daily. (Patient taking differently: Take 100 mg by mouth as needed.) 10 capsule 0   dorzolamide-timolol (COSOPT) 22.3-6.8 MG/ML ophthalmic solution Place 1 drop into both eyes 2 (two) times daily.      glucose blood (ONETOUCH VERIO) test strip 1 each by Other route 2 (two) times daily. And lancets 2/day 200 each 3   insulin aspart (NOVOLOG) 100 UNIT/ML injection Up to 50 units daily in insulin pump 20 mL 2   Insulin Disposable Pump (OMNIPOD 5 G6 INTRO, GEN 5,) KIT Inject into the skin.      Insulin Disposable Pump (OMNIPOD 5 G6 PODS, GEN 5,) MISC SMARTSIG:SUB-Q Every 3 Days 10 each 5   Insulin Pen Needle 32G X 4 MM MISC Use as instructed to administer insulin 4-5X daily. E10.65 400 each 1   Insulin Syringe-Needle U-100 (BD VEO INSULIN SYRINGE U/F) 31G X 15/64" 1 ML MISC USE  SYRINGE TWICE DAILY 200 each 5   Lancets (ONETOUCH ULTRASOFT) lancets Use to check blood sugar 3 times a day 100 each 12   Multiple Vitamins-Minerals (MULTIVITAMIN ADULTS  50+) TABS Take 1 tablet by mouth daily.     polyethylene glycol (MIRALAX / GLYCOLAX) 17 g packet Take 17 g by mouth daily. (Patient taking differently: Take 17 g by mouth as needed.) 14 each 0   No current facility-administered medications on file prior to visit.    BP 138/84   Pulse 72   Temp 97.7 F (36.5 C) (Oral)   Ht 6\' 3"  (1.905 m)   Wt 214 lb (97.1 kg)   SpO2 98%   BMI 26.75 kg/m      Objective:   Physical Exam Vitals and nursing note reviewed.  Constitutional:      General: He is not in acute distress.    Appearance: Normal appearance. He is not ill-appearing.  HENT:     Head: Normocephalic and atraumatic.     Right Ear: Tympanic membrane, ear canal and external ear normal. There is no impacted cerumen.     Left Ear: Tympanic membrane, ear canal and external ear normal. There is no impacted cerumen.     Nose: Nose normal. No congestion or rhinorrhea.     Mouth/Throat:     Mouth: Mucous membranes are moist.     Pharynx: Oropharynx is clear.  Eyes:     Extraocular Movements: Extraocular movements intact.     Conjunctiva/sclera: Conjunctivae normal.     Pupils: Pupils are equal, round, and reactive to light.  Neck:     Vascular: No carotid bruit.  Cardiovascular:     Rate and Rhythm: Normal rate and regular rhythm.     Pulses: Normal pulses.     Heart sounds: No murmur heard.    No friction rub. No gallop.  Pulmonary:     Effort: Pulmonary effort is normal.     Breath sounds: Normal breath sounds.  Abdominal:      General: Abdomen is flat. Bowel sounds are normal. There is no distension.     Palpations: Abdomen is soft. There is no mass.     Tenderness: There is no abdominal tenderness. There is no guarding or rebound.     Hernia: No hernia is present.  Musculoskeletal:        General: Normal range of motion.     Cervical back: Normal range of motion and neck supple.  Lymphadenopathy:     Cervical: No cervical adenopathy.  Skin:    General: Skin is warm and dry.     Capillary Refill: Capillary refill takes less than 2 seconds.  Neurological:     General: No focal deficit present.     Mental Status: He is alert and oriented to person, place, and time.  Psychiatric:        Mood and Affect: Mood normal.        Behavior: Behavior normal.        Thought Content: Thought content normal.        Judgment: Judgment normal.       Assessment & Plan:  1. Routine general medical examination at a health care facility Today patient counseled on age appropriate routine health concerns for screening and prevention, each reviewed and up to date or declined. Immunizations reviewed and up to date or declined. Labs ordered and reviewed. Risk factors for depression reviewed and negative. Hearing function and visual acuity are intact. ADLs screened and addressed as needed. Functional ability and level of safety reviewed and appropriate. Education, counseling and referrals performed based on assessed risks today. Patient provided with a copy of personalized plan for  preventive services. - Follow up in one year or sooner if needed - Advised shingles vaccination at pharmacy   2. Type 1 diabetes mellitus with unspecified complications - Follow up with Endocrinology as directed.  - Will add back lisinopril 2.5 mg daily for kidney protection  - Lipid panel; Future - TSH; Future - CBC; Future - Comprehensive metabolic panel; Future - lisinopril (ZESTRIL) 2.5 MG tablet; Take 1 tablet (2.5 mg total) by mouth daily.   Dispense: 90 tablet; Refill: 3  3. Hyperlipidemia due to type 1 diabetes mellitus - Continue statin  - Lipid panel; Future - TSH; Future - CBC; Future - Comprehensive metabolic panel; Future  4. Glaucoma, unspecified glaucoma type, unspecified laterality - Per ophthalmology   5. Need for hepatitis C screening test  - Hep C Antibody; Future  6. Prostate cancer screening  - PSA; Future  7. Hypertension associated with diabetes  - lisinopril (ZESTRIL) 2.5 MG tablet; Take 1 tablet (2.5 mg total) by mouth daily.  Dispense: 90 tablet; Refill: 3  Dorothyann Peng, NP

## 2023-03-19 NOTE — Patient Instructions (Addendum)
It was great seeing you today   We will follow up with you regarding your lab work   Please let me know if you need anything   Please get the shingles vaccination at the pharmacy

## 2023-03-20 ENCOUNTER — Other Ambulatory Visit (HOSPITAL_COMMUNITY): Payer: Self-pay

## 2023-03-20 ENCOUNTER — Telehealth: Payer: Self-pay | Admitting: *Deleted

## 2023-03-20 ENCOUNTER — Other Ambulatory Visit (INDEPENDENT_AMBULATORY_CARE_PROVIDER_SITE_OTHER): Payer: Medicare HMO

## 2023-03-20 DIAGNOSIS — E1069 Type 1 diabetes mellitus with other specified complication: Secondary | ICD-10-CM | POA: Diagnosis not present

## 2023-03-20 DIAGNOSIS — E108 Type 1 diabetes mellitus with unspecified complications: Secondary | ICD-10-CM | POA: Diagnosis not present

## 2023-03-20 DIAGNOSIS — E1065 Type 1 diabetes mellitus with hyperglycemia: Secondary | ICD-10-CM | POA: Diagnosis not present

## 2023-03-20 DIAGNOSIS — Z1159 Encounter for screening for other viral diseases: Secondary | ICD-10-CM | POA: Diagnosis not present

## 2023-03-20 DIAGNOSIS — Z125 Encounter for screening for malignant neoplasm of prostate: Secondary | ICD-10-CM

## 2023-03-20 DIAGNOSIS — E785 Hyperlipidemia, unspecified: Secondary | ICD-10-CM | POA: Diagnosis not present

## 2023-03-20 LAB — COMPREHENSIVE METABOLIC PANEL
ALT: 9 U/L (ref 0–53)
AST: 14 U/L (ref 0–37)
Albumin: 4 g/dL (ref 3.5–5.2)
Alkaline Phosphatase: 91 U/L (ref 39–117)
BUN: 18 mg/dL (ref 6–23)
CO2: 28 mEq/L (ref 19–32)
Calcium: 9.3 mg/dL (ref 8.4–10.5)
Chloride: 106 mEq/L (ref 96–112)
Creatinine, Ser: 1.43 mg/dL (ref 0.40–1.50)
GFR: 47.86 mL/min — ABNORMAL LOW (ref >60.00)
Glucose, Bld: 122 mg/dL — ABNORMAL HIGH (ref 70–99)
Potassium: 4 mEq/L (ref 3.5–5.1)
Sodium: 141 mEq/L (ref 135–145)
Total Bilirubin: 0.8 mg/dL (ref 0.2–1.2)
Total Protein: 6.7 g/dL (ref 6.0–8.3)

## 2023-03-20 LAB — LIPID PANEL
Cholesterol: 214 mg/dL — ABNORMAL HIGH (ref 0–200)
HDL: 51.2 mg/dL (ref >39.00)
LDL Cholesterol: 149 mg/dL — ABNORMAL HIGH (ref 0–99)
NonHDL: 162.56
Total CHOL/HDL Ratio: 4
Triglycerides: 67 mg/dL (ref 0.0–149.0)
VLDL: 13.4 mg/dL (ref 0.0–40.0)

## 2023-03-20 LAB — CBC
HCT: 41.2 % (ref 39.0–52.0)
Hemoglobin: 14 g/dL (ref 13.0–17.0)
MCHC: 34 g/dL (ref 30.0–36.0)
MCV: 74.6 fl — ABNORMAL LOW (ref 78.0–100.0)
Platelets: 221 10*3/uL (ref 150.0–400.0)
RBC: 5.52 Mil/uL (ref 4.22–5.81)
RDW: 15.7 % — ABNORMAL HIGH (ref 11.5–15.5)
WBC: 4.9 10*3/uL (ref 4.0–10.5)

## 2023-03-20 LAB — PSA: PSA: 0.8 ng/mL (ref 0.10–4.00)

## 2023-03-20 LAB — TSH: TSH: 1.25 u[IU]/mL (ref 0.35–5.50)

## 2023-03-20 NOTE — Telephone Encounter (Signed)
Pt wife called --stated pt had headache since yesterday and blood sugar dropping to 89 according to [facility]. Please advise

## 2023-03-21 LAB — HEPATITIS C ANTIBODY: Hepatitis C Ab: NONREACTIVE

## 2023-03-21 NOTE — Telephone Encounter (Signed)
Spoke to pt wife--stated pt seen PCP yesterday and prescribe low dose Bp meds for headache and pt feeling much better. Notified pt wife 89 blood sugar is normal per Dr. Lucianne Muss.

## 2023-03-24 NOTE — Progress Notes (Unsigned)
Cardiology Office Note   Date:  03/25/2023   ID:  Christopher [name], DOB November 22, 1946, MRN [id number]  PCP:  Shirline Frees, NP    No chief complaint on file.  Elevated troponin while in the hospital  Wt Readings from Last 3 Encounters:  03/25/23 211 lb 3.2 oz (95.8 kg)  03/19/23 214 lb (97.1 kg)  02/25/23 209 lb 9.6 oz (95.1 kg)       History of Present Illness: Christopher [name] is a 77 y.o. male who had elevated troponin during an episode of DKA in the hospital and December 2023.  He also had acute renal failure with creatinine up to 3.3.  It did improve.  Echocardiogram was done showing no regional wall motion abnormality.  The patient had no anginal symptoms.  He was managed medically.  Stress test in January 2024: "Findings are consistent with no prior ischemia and no prior myocardial infarction. The study is low risk.   No ST deviation was noted.   Left ventricular function is abnormal. Global function is mildly reduced. Nuclear stress EF: 50 %. The left ventricular ejection fraction is mildly decreased (45-54%). End diastolic cavity size is mildly enlarged. End systolic cavity size is normal.   Prior study not available for comparison.   Negative stress test. Low normal ejection fraction."  Denies : Chest pain. Dizziness. Leg edema. Nitroglycerin use. Orthopnea. Palpitations. Paroxysmal nocturnal dyspnea. Shortness of breath. Syncope.     Past Medical History:  Diagnosis Date   Diabetes mellitus type II    GERD (gastroesophageal reflux disease)    Glaucoma    Hyperlipidemia    Hypertension     Past Surgical History:  Procedure Laterality Date   COLONOSCOPY     HERNIA REPAIR     PATELLA FRACTURE SURGERY  1993     Current Outpatient Medications  Medication Sig Dispense Refill   atorvastatin (LIPITOR) 80 MG tablet Take 1 tablet (80 mg total) by mouth at bedtime. 90 tablet 3   brimonidine (ALPHAGAN) 0.15 % ophthalmic solution Place 1 drop into both eyes 2 (two)  times daily.     Continuous Blood Gluc Receiver (DEXCOM G7 RECEIVER) DEVI Use to check blood sugar 1 each 0   Continuous Blood Gluc Sensor (DEXCOM G6 SENSOR) MISC Change every 10 days 9 each 2   Continuous Blood Gluc Sensor (FREESTYLE LIBRE 2 SENSOR) MISC 1 Device by Does not apply route every 14 (fourteen) days. 6 each 3   Continuous Blood Gluc Transmit (DEXCOM G6 TRANSMITTER) MISC Use every 3 months 1 each 2   docusate sodium (COLACE) 100 MG capsule Take 1 capsule (100 mg total) by mouth 2 (two) times daily. (Patient taking differently: Take 100 mg by mouth as needed.) 10 capsule 0   dorzolamide-timolol (COSOPT) 22.3-6.8 MG/ML ophthalmic solution Place 1 drop into both eyes 2 (two) times daily.      glucose blood (ONETOUCH VERIO) test strip 1 each by Other route 2 (two) times daily. And lancets 2/day 200 each 3   insulin aspart (NOVOLOG) 100 UNIT/ML injection Up to 50 units daily in insulin pump 20 mL 2   Insulin Disposable Pump (OMNIPOD 5 G6 INTRO, GEN 5,) KIT Inject into the skin.     Insulin Disposable Pump (OMNIPOD 5 G6 PODS, GEN 5,) MISC SMARTSIG:SUB-Q Every 3 Days 10 each 5   Insulin Pen Needle 32G X 4 MM MISC Use as instructed to administer insulin 4-5X daily. E10.65 400 each 1   Insulin Syringe-Needle  U-100 (BD VEO INSULIN SYRINGE U/F) 31G X 15/64" 1 ML MISC USE  SYRINGE TWICE DAILY 200 each 5   Lancets (ONETOUCH ULTRASOFT) lancets Use to check blood sugar 3 times a day 100 each 12   lisinopril (ZESTRIL) 2.5 MG tablet Take 1 tablet (2.5 mg total) by mouth daily. 90 tablet 3   Multiple Vitamins-Minerals (MULTIVITAMIN ADULTS 50+) TABS Take 1 tablet by mouth daily.     polyethylene glycol (MIRALAX / GLYCOLAX) 17 g packet Take 17 g by mouth daily. (Patient taking differently: Take 17 g by mouth as needed.) 14 each 0   No current facility-administered medications for this visit.    Allergies:   Patient has no known allergies.    Social History:  The patient  reports that he has never  smoked. He has never used smokeless tobacco. He reports that he does not drink alcohol and does not use drugs.   Family History:  The patient's family history includes Alcohol abuse in his father; Diabetes in his brother, brother, and sister.    ROS:  Please see the history of present illness.   Otherwise, review of systems are positive for memory issues.   All other systems are reviewed and negative.    PHYSICAL EXAM: VS:  BP 134/80   Pulse 74   Ht 6\' 3"  (1.905 m)   Wt 211 lb 3.2 oz (95.8 kg)   SpO2 98%   BMI 26.40 kg/m  , BMI Body mass index is 26.4 kg/m. GEN: Well nourished, well developed, in no acute distress HEENT: normal Neck: no JVD, carotid bruits, or masses Cardiac: RRR; no murmurs, rubs, or gallops,no edema  Respiratory:  clear to auscultation bilaterally, normal work of breathing GI: soft, nontender, nondistended, + BS MS: no deformity or atrophy Skin: warm and dry, no rash Neuro:  Strength and sensation are intact Psych: euthymic mood, full affect   Recent Labs: 12/10/2022: Magnesium 1.7 03/20/2023: ALT 9; BUN 18; Creatinine, Ser 1.43; Hemoglobin 14.0; Platelets 221.0; Potassium 4.0; Sodium 141; TSH 1.25   Lipid Panel    Component Value Date/Time   CHOL 214 (H) 03/20/2023 0832   TRIG 67.0 03/20/2023 0832   HDL 51.20 03/20/2023 0832   CHOLHDL 4 03/20/2023 0832   VLDL 13.4 03/20/2023 0832   LDLCALC 149 (H) 03/20/2023 0832   LDLDIRECT 135.3 06/17/2013 0838     Other studies Reviewed: Additional studies/ records that were reviewed today with results demonstrating: EF in the normal range by echo while done in the hospital.  April 2024 total cholesterol 214 LDL 149 HDL 51 creatinine 0.35 triglycerides 67   ASSESSMENT AND PLAN:  Elevated troponin: No ischemia by stress test in January 2024. Likely demand ischemia.  Hypertension: Whole food, plant-based diet.  Avoid processed foods.   The current medical regimen is effective;  continue present plan and  medications. Hyperlipidemia: High-fiber diet.  Atorvastatin prescribed but he admits to missing some doses. Family mentions some memory issues.  CKD stage IIIa: ACE inhibitor has been held, but was restarted in 3/24.  Stay well-hydrated.  Avoid nephrotoxins. DM: 150 minutes of exercise per week will be helpful to control the blood sugar, but is important to do that without risking falling. Consider a stationary bike.  12/23 8.9 A1C.  Current medicines are reviewed at length with the patient today.  The patient concerns regarding his medicines were addressed.  The following changes have been made:  No change  Labs/ tests ordered today include:  No orders of  the defined types were placed in this encounter.   Recommend 150 minutes/week of aerobic exercise Low fat, low carb, high fiber diet recommended  Disposition:   FU in 1 year   Signed, Lance MussJayadeep Erminia Mcnew, MD  03/25/2023 10:15 AM    [facility] [address], [place], [place]  [address] Phone: [phone]; Fax: [phone]

## 2023-03-25 ENCOUNTER — Encounter: Payer: Self-pay | Admitting: Interventional Cardiology

## 2023-03-25 ENCOUNTER — Ambulatory Visit: Payer: Medicare HMO | Attending: Interventional Cardiology | Admitting: Interventional Cardiology

## 2023-03-25 VITALS — BP 134/80 | HR 74 | Ht 75.0 in | Wt 211.2 lb

## 2023-03-25 DIAGNOSIS — I10 Essential (primary) hypertension: Secondary | ICD-10-CM

## 2023-03-25 DIAGNOSIS — E785 Hyperlipidemia, unspecified: Secondary | ICD-10-CM | POA: Diagnosis not present

## 2023-03-25 DIAGNOSIS — N179 Acute kidney failure, unspecified: Secondary | ICD-10-CM | POA: Diagnosis not present

## 2023-03-25 NOTE — Patient Instructions (Signed)
Medication Instructions:  Your physician recommends that you continue on your current medications as directed. Please refer to the Current Medication list given to you today.  *If you need a refill on your cardiac medications before your next appointment, please call your pharmacy*   Lab Work: none If you have labs (blood work) drawn today and your tests are completely normal, you will receive your results only by: MyChart Message (if you have MyChart) OR A paper copy in the mail If you have any lab test that is abnormal or we need to change your treatment, we will call you to review the results.   Testing/Procedures: none   Follow-Up: At [facility], you and your health needs are our priority.  As part of our continuing mission to provide you with exceptional heart care, we have created designated Provider Care Teams.  These Care Teams include your primary Cardiologist (physician) and Advanced Practice Providers (APPs -  Physician Assistants and Nurse Practitioners) who all work together to provide you with the care you need, when you need it.  We recommend signing up for the patient portal called "MyChart".  Sign up information is provided on this After Visit Summary.  MyChart is used to connect with patients for Virtual Visits (Telemedicine).  Patients are able to view lab/test results, encounter notes, upcoming appointments, etc.  Non-urgent messages can be sent to your provider as well.   To learn more about what you can do with MyChart, go to [email or web address].    Your next appointment:   12 month(s)  Provider:   Jayadeep Varanasi, MD     Other Instructions  High-Fiber Eating Plan Fiber, also called dietary fiber, is a type of carbohydrate. It is found foods such as fruits, vegetables, whole grains, and beans. A high-fiber diet can have many health benefits. Your health care provider may recommend a high-fiber diet to help: Prevent constipation. Fiber can make  your bowel movements more regular. Lower your cholesterol. Relieve the following conditions: Inflammation of veins in the anus (hemorrhoids). Inflammation of specific areas of the digestive tract (uncomplicated diverticulosis). A problem of the large intestine, also called the colon, that sometimes causes pain and diarrhea (irritable bowel syndrome, or IBS). Prevent overeating as part of a weight-loss plan. Prevent heart disease, type 2 diabetes, and certain cancers. What are tips for following this plan? Reading food labels  Check the nutrition facts label on food products for the amount of dietary fiber. Choose foods that have 5 grams of fiber or more per serving. The goals for recommended daily fiber intake include: Men (age 50 or younger): 34-38 g. Men (over age 50): 28-34 g. Women (age 50 or younger): 25-28 g. Women (over age 50): 22-25 g. Your daily fiber goal is _____________ g. Shopping Choose whole fruits and vegetables instead of processed forms, such as apple juice or applesauce. Choose a wide variety of high-fiber foods such as avocados, lentils, oats, and kidney beans. Read the nutrition facts label of the foods you choose. Be aware of foods with added fiber. These foods often have high sugar and sodium amounts per serving. Cooking Use whole-grain flour for baking and cooking. Cook with brown rice instead of white rice. Meal planning Start the day with a breakfast that is high in fiber, such as a cereal that contains 5 g of fiber or more per serving. Eat breads and cereals that are made with whole-grain flour instead of refined flour or white flour. Eat brown rice, bulgur wheat,   or millet instead of white rice. Use beans in place of meat in soups, salads, and pasta dishes. Be sure that half of the grains you eat each day are whole grains. General information You can get the recommended daily intake of dietary fiber by: Eating a variety of fruits, vegetables, grains,  nuts, and beans. Taking a fiber supplement if you are not able to take in enough fiber in your diet. It is better to get fiber through food than from a supplement. Gradually increase how much fiber you consume. If you increase your intake of dietary fiber too quickly, you may have bloating, cramping, or gas. Drink plenty of water to help you digest fiber. Choose high-fiber snacks, such as berries, raw vegetables, nuts, and popcorn. What foods should I eat? Fruits Berries. Pears. Apples. Oranges. Avocado. Prunes and raisins. Dried figs. Vegetables Sweet potatoes. Spinach. Kale. Artichokes. Cabbage. Broccoli. Cauliflower. Green peas. Carrots. Squash. Grains Whole-grain breads. Multigrain cereal. Oats and oatmeal. Brown rice. Barley. Bulgur wheat. Millet. Quinoa. Bran muffins. Popcorn. Rye wafer crackers. Meats and other proteins Navy beans, kidney beans, and pinto beans. Soybeans. Split peas. Lentils. Nuts and seeds. Dairy Fiber-fortified yogurt. Beverages Fiber-fortified soy milk. Fiber-fortified orange juice. Other foods Fiber bars. The items listed above may not be a complete list of recommended foods and beverages. Contact a dietitian for more information. What foods should I avoid? Fruits Fruit juice. Cooked, strained fruit. Vegetables Fried potatoes. Canned vegetables. Well-cooked vegetables. Grains White bread. Pasta made with refined flour. White rice. Meats and other proteins Fatty cuts of meat. Fried chicken or fried fish. Dairy Milk. Yogurt. Cream cheese. Sour cream. Fats and oils Butters. Beverages Soft drinks. Other foods Cakes and pastries. The items listed above may not be a complete list of foods and beverages to avoid. Talk with your dietitian about what choices are best for you. Summary Fiber is a type of carbohydrate. It is found in foods such as fruits, vegetables, whole grains, and beans. A high-fiber diet has many benefits. It can help to prevent  constipation, lower blood cholesterol, aid weight loss, and reduce your risk of heart disease, diabetes, and certain cancers. Increase your intake of fiber gradually. Increasing fiber too quickly may cause cramping, bloating, and gas. Drink plenty of water while you increase the amount of fiber you consume. The best sources of fiber include whole fruits and vegetables, whole grains, nuts, seeds, and beans. This information is not intended to replace advice given to you by your health care provider. Make sure you discuss any questions you have with your health care provider. Document Revised: 04/06/2020 Document Reviewed: 04/06/2020 Elsevier Patient Education  2023 Elsevier Inc.   

## 2023-03-26 ENCOUNTER — Other Ambulatory Visit: Payer: Self-pay

## 2023-03-26 DIAGNOSIS — I10 Essential (primary) hypertension: Secondary | ICD-10-CM

## 2023-03-26 DIAGNOSIS — E1122 Type 2 diabetes mellitus with diabetic chronic kidney disease: Secondary | ICD-10-CM

## 2023-04-19 ENCOUNTER — Encounter (HOSPITAL_COMMUNITY): Payer: Self-pay | Admitting: *Deleted

## 2023-04-19 ENCOUNTER — Other Ambulatory Visit: Payer: Self-pay

## 2023-04-19 ENCOUNTER — Emergency Department (HOSPITAL_COMMUNITY)
Admission: EM | Admit: 2023-04-19 | Discharge: 2023-04-20 | Disposition: A | Discharge status: Home / Self Care | Payer: Medicare HMO | Attending: Emergency Medicine | Admitting: Emergency Medicine

## 2023-04-19 DIAGNOSIS — I10 Essential (primary) hypertension: Secondary | ICD-10-CM | POA: Diagnosis not present

## 2023-04-19 DIAGNOSIS — R4182 Altered mental status, unspecified: Secondary | ICD-10-CM | POA: Insufficient documentation

## 2023-04-19 DIAGNOSIS — R739 Hyperglycemia, unspecified: Secondary | ICD-10-CM | POA: Diagnosis present

## 2023-04-19 DIAGNOSIS — E1165 Type 2 diabetes mellitus with hyperglycemia: Secondary | ICD-10-CM | POA: Diagnosis not present

## 2023-04-19 DIAGNOSIS — Z0389 Encounter for observation for other suspected diseases and conditions ruled out: Secondary | ICD-10-CM | POA: Diagnosis not present

## 2023-04-19 DIAGNOSIS — Z794 Long term (current) use of insulin: Secondary | ICD-10-CM | POA: Diagnosis not present

## 2023-04-19 LAB — CBG MONITORING, ED: Glucose-Capillary: 374 mg/dL — ABNORMAL HIGH (ref 70–99)

## 2023-04-19 NOTE — ED Triage Notes (Signed)
The pts blood sugar is high  the pt reports that he took some med at home that was old and the pt thinks it made his blood sugar get higher

## 2023-04-20 ENCOUNTER — Emergency Department (HOSPITAL_COMMUNITY): Payer: Medicare HMO

## 2023-04-20 DIAGNOSIS — Z0389 Encounter for observation for other suspected diseases and conditions ruled out: Secondary | ICD-10-CM | POA: Diagnosis not present

## 2023-04-20 DIAGNOSIS — R4182 Altered mental status, unspecified: Secondary | ICD-10-CM | POA: Diagnosis not present

## 2023-04-20 LAB — URINALYSIS, ROUTINE W REFLEX MICROSCOPIC
Bacteria, UA: NONE SEEN
Bilirubin Urine: NEGATIVE
Glucose, UA: >=500 mg/dL — AB
Hgb urine dipstick: NEGATIVE
Ketones, ur: 20 mg/dL — AB
Leukocytes,Ua: NEGATIVE
Nitrite: NEGATIVE
Protein, ur: NEGATIVE mg/dL
Specific Gravity, Urine: 1.016 (ref 1.005–1.030)
pH: 6 (ref 5.0–8.0)

## 2023-04-20 LAB — COMPREHENSIVE METABOLIC PANEL
ALT: 17 U/L (ref 0–44)
AST: 21 U/L (ref 15–41)
Albumin: 3.7 g/dL (ref 3.5–5.0)
Alkaline Phosphatase: 109 U/L (ref 38–126)
Anion gap: 10 (ref 5–15)
BUN: 21 mg/dL (ref 8–23)
CO2: 19 mmol/L — ABNORMAL LOW (ref 22–32)
Calcium: 8.8 mg/dL — ABNORMAL LOW (ref 8.9–10.3)
Chloride: 104 mmol/L (ref 98–111)
Creatinine, Ser: 1.67 mg/dL — ABNORMAL HIGH (ref 0.61–1.24)
GFR, Estimated: 42 mL/min — ABNORMAL LOW (ref >60)
Glucose, Bld: 400 mg/dL — ABNORMAL HIGH (ref 70–99)
Potassium: 4.6 mmol/L (ref 3.5–5.1)
Sodium: 133 mmol/L — ABNORMAL LOW (ref 135–145)
Total Bilirubin: 0.6 mg/dL (ref 0.3–1.2)
Total Protein: 6.8 g/dL (ref 6.5–8.1)

## 2023-04-20 LAB — CBC
HCT: 40.4 % (ref 39.0–52.0)
Hemoglobin: 13.7 g/dL (ref 13.0–17.0)
MCH: 24.8 pg — ABNORMAL LOW (ref 26.0–34.0)
MCHC: 33.9 g/dL (ref 30.0–36.0)
MCV: 73.1 fL — ABNORMAL LOW (ref 80.0–100.0)
Platelets: 230 10*3/uL (ref 150–400)
RBC: 5.53 MIL/uL (ref 4.22–5.81)
RDW: 15.7 % — ABNORMAL HIGH (ref 11.5–15.5)
WBC: 5.9 10*3/uL (ref 4.0–10.5)
nRBC: 0 % (ref 0.0–0.2)

## 2023-04-20 LAB — CBG MONITORING, ED
Glucose-Capillary: 380 mg/dL — ABNORMAL HIGH (ref 70–99)
Glucose-Capillary: 356 mg/dL — ABNORMAL HIGH (ref 70–99)
Glucose-Capillary: 319 mg/dL — ABNORMAL HIGH (ref 70–99)

## 2023-04-20 LAB — TROPONIN I (HIGH SENSITIVITY)
Troponin I (High Sensitivity): 30 ng/L — ABNORMAL HIGH (ref <18)
Troponin I (High Sensitivity): 50 ng/L — ABNORMAL HIGH (ref <18)
Troponin I (High Sensitivity): 69 ng/L — ABNORMAL HIGH (ref <18)

## 2023-04-20 MED ORDER — LACTATED RINGERS IV BOLUS
1000.0000 mL | Freq: Once | INTRAVENOUS | Status: AC
  Administered 2023-04-20: 1000 mL via INTRAVENOUS

## 2023-04-20 MED ORDER — INSULIN ASPART 100 UNIT/ML IV SOLN
10.0000 [IU] | Freq: Once | INTRAVENOUS | Status: AC
  Administered 2023-04-20: 10 [IU] via INTRAVENOUS

## 2023-04-20 MED ORDER — INSULIN ASPART 100 UNIT/ML IJ SOLN: 5.0000 [IU] | Freq: Once | INTRAMUSCULAR | Status: DC

## 2023-04-20 MED ORDER — INSULIN ASPART 100 UNIT/ML IJ SOLN
10.0000 [IU] | Freq: Once | INTRAMUSCULAR | Status: AC
  Administered 2023-04-20: 10 [IU] via INTRAVENOUS

## 2023-04-20 MED ORDER — INSULIN ASPART 100 UNIT/ML IJ SOLN
7.0000 [IU] | Freq: Once | INTRAMUSCULAR | Status: AC
  Administered 2023-04-20: 7 [IU] via INTRAVENOUS

## 2023-04-20 MED ORDER — LORAZEPAM 2 MG/ML IJ SOLN
0.5000 mg | Freq: Once | INTRAMUSCULAR | Status: DC
  Filled 2023-04-20: qty 1

## 2023-04-20 NOTE — Inpatient Diabetes Management (Signed)
Inpatient Diabetes Program Recommendations  AACE/ADA: New Consensus Statement on Inpatient Glycemic Control (2015)  Target Ranges:  Prepandial:   less than 140 mg/dL      Peak postprandial:   less than 180 mg/dL (1-2 hours)      Critically ill patients:  140 - 180 mg/dL   Lab Results  Component Value Date   GLUCAP 374 (H) 04/19/2023   HGBA1C 8.9 (H) 12/05/2022    Review of Glycemic Control  Latest Reference Range & Units 04/19/23 23:34  Glucose-Capillary 70 - 99 mg/dL 562 (H)  (H): Data is abnormally high Diabetes history: Type 1 DM (per outpatient endocrinology) Outpatient Diabetes medications: Omnipod- 1.15 units/hr, 8B/6 L/D, ISF 40 Current orders for Inpatient glycemic control: none  Inpatient Diabetes Program Recommendations:    Spoke with patient's wife, hazel regarding insulin pump. Verified insulin pump rates and that they have used a new site for application and noticed hyperglycemia that was not resolving with bolusing the pump. Patient is followed by Dr Glenetta Hew and is relatively new to the pump beginning in 01/2023.  Reviewed patient's current A1c of 8.9%. Explained what a A1c is and what it measures. Also reviewed goal A1c with patient, importance of good glucose control @ home, and blood sugar goals. Reviewed differences between auto vs manual modes, basic survival skills with insulin pump and need to replace pod if noticing hyperglycemia without improvement following boluses. Patient's wife wants to continue to use insulin pump for now and is able to follow remotely patient's blood sugars through the use of Dexcom.  Encouraged to replace pod now and then monitor blood sugars in ED to ensure improvement. Patient has follow up with Lucianne Muss on 15th. Encouraged to also see Bonita Quin prior to appointment. No further questions at this time.  Discussed with Dr Rosalia Hammers; in agreement. Plan to follow CBGs Q2H for atleast 4-6 hours after initial pod placement and correction bolus.    Thanks, Lujean Rave, MSN, RNC-OB Diabetes Coordinator [phone] (8a-5p)

## 2023-04-20 NOTE — ED Notes (Addendum)
Pt left with his wife, was assisted by staff in a w/c at wife's request while she pulled up the car, EDP Ray made aware.

## 2023-04-20 NOTE — ED Notes (Signed)
This RN saw pt's wife at the general area of the charge desk speaking about pt going to be helped to their car via w/c while she pulled the car to the door. Pt was then seen being pushed by a staff member in the direction of the lobby shortly after. This RN went into chart and noticed a recent consult to cardiology and that he was not up for d/c but the pt and wife was already gone so EDP Ray was made aware. Dr. Rosalia Hammers called wife at this time & this RN asked the RN who had been helping sit with that pt for his safety while the wife frequently left him alone & he needed constant consoling & redirection d/t his baseline confusion if she had d/c him. That RN replied that she did not d/c him. The tech for [facility] zone then said that RN was the one who had been in there with them before they left. Charge RN made aware as well.

## 2023-04-20 NOTE — ED Notes (Signed)
Patient's personal glucometer is currently reading "High", per Dr. Clayborne Dana use patient's personal glucometer reading for insulin administration

## 2023-04-20 NOTE — ED Provider Notes (Signed)
  Physical Exam  BP (!) 149/89 (BP Location: Right Arm)   Pulse 79   Temp 97.9 F (36.6 C) (Oral)   Resp 18   Ht 1.905 m (6\' 3" )   Wt 95.8 kg   SpO2 98%   BMI 26.40 kg/m   Physical Exam  Procedures  Procedures  ED Course / MDM    Medical Decision Making Amount and/or Complexity of Data Reviewed Labs: ordered. Radiology: ordered.  Risk OTC drugs. Prescription drug management.   77 yo male ho iddm presents with hyperglycemia.  BS spiked yesterday without symptoms. No symptoms noted here. Here with bs 374, then 400 on labs Ketones in urine Bicarb 19, no gap IV fluids one liter and regular 10 units given BS 350 Plan home check of bs and will take normal am dose Troponin checked normal No uti,  Cxr clear Ct head normal  EKG performed and within normal limits No reports of chest pain Patient asked again and did not have chest pain.  Troponins are trending up at 30, 50, and 69.  Review of prior reveals that patient did have NSTEMI when he was hospitalized in January.  Patient did have some elevation of cardiac enzymes prior to that in October and April 2023 Patient was discharged by nursing.  I called wife to inform her that that was not supposed to happen.  We discussed the cardiac enzymes that were elevated.  She is aware.  She is following his troponins.  I discussed return to the ED.  She does not wish to return at this time.  She does understand the risks. Care discussed with Dr. Lalla Brothers.  I have messaged him and he will inform office that patient needs to be evaluated. Discussed return precautions and need for follow-up with wife.       Margarita Grizzle, MD 04/20/23 [id number]

## 2023-04-20 NOTE — ED Provider Notes (Signed)
[place] EMERGENCY DEPARTMENT AT [facility] Provider Note   CSN: [id number] Arrival date & time: 04/19/23  2325     History  Chief Complaint  Patient presents with   Hyperglycemia    Christopher [name] is a 77 y.o. male.  77 year old male with history of diabetes that uses an pod for insulin distribution and also some type of glucose monitor.  Has had elevated blood sugars throughout the day today that were above 400 (his glucometer does not read higher than that).  No other associated symptoms.  Has not been ill recently.  Did have an episode yesterday where he had trouble getting his words out but that resolved spontaneously.  No fevers.  No rashes.  States he has been confused recently as someone told him he could leave his insulin out but he had previously refrigerated it.   Hyperglycemia      Home Medications Prior to Admission medications   Medication Sig Start Date End Date Taking? Authorizing Provider  atorvastatin (LIPITOR) 80 MG tablet Take 1 tablet (80 mg total) by mouth at bedtime. 12/20/22   Sharlene Dory, PA-C  brimonidine (ALPHAGAN) 0.15 % ophthalmic solution Place 1 drop into both eyes 2 (two) times daily.    [provider]  Continuous Blood Gluc Receiver (DEXCOM G7 RECEIVER) DEVI Use to check blood sugar 11/19/22   Reather Littler, MD  Continuous Blood Gluc Sensor (DEXCOM G6 SENSOR) MISC Change every 10 days 11/19/22   Reather Littler, MD  Continuous Blood Gluc Sensor (FREESTYLE LIBRE 2 SENSOR) MISC 1 Device by Does not apply route every 14 (fourteen) days. 04/03/22   Philip Aspen, Limmie Patricia, MD  Continuous Blood Gluc Transmit (DEXCOM G6 TRANSMITTER) MISC Use every 3 months 11/19/22   Reather Littler, MD  docusate sodium (COLACE) 100 MG capsule Take 1 capsule (100 mg total) by mouth 2 (two) times daily. Patient taking differently: Take 100 mg by mouth as needed. 12/10/22   Barnetta Chapel, MD  dorzolamide-timolol (COSOPT) 22.3-6.8 MG/ML ophthalmic solution  Place 1 drop into both eyes 2 (two) times daily.  03/03/11   [provider]  glucose blood (ONETOUCH VERIO) test strip 1 each by Other route 2 (two) times daily. And lancets 2/day 04/19/22   Romero Belling, MD  insulin aspart (NOVOLOG) 100 UNIT/ML injection Up to 50 units daily in insulin pump 03/11/23   Reather Littler, MD  Insulin Disposable Pump (OMNIPOD 5 G6 INTRO, GEN 5,) KIT Inject into the skin. 12/10/22   [provider]  Insulin Disposable Pump (OMNIPOD 5 G6 PODS, GEN 5,) MISC SMARTSIG:SUB-Q Every 3 Days 02/25/23   Reather Littler, MD  Insulin Pen Needle 32G X 4 MM MISC Use as instructed to administer insulin 4-5X daily. E10.65 10/29/22   Reather Littler, MD  Insulin Syringe-Needle U-100 (BD VEO INSULIN SYRINGE U/F) 31G X 15/64" 1 ML MISC USE  SYRINGE TWICE DAILY 07/29/22   Reather Littler, MD  Lancets [facility] ULTRASOFT) lancets Use to check blood sugar 3 times a day 12/11/22   Reather Littler, MD  lisinopril (ZESTRIL) 2.5 MG tablet Take 1 tablet (2.5 mg total) by mouth daily. 03/19/23   Nafziger, Kandee Keen, NP  Multiple Vitamins-Minerals (MULTIVITAMIN ADULTS 50+) TABS Take 1 tablet by mouth daily.    [provider]  polyethylene glycol (MIRALAX / GLYCOLAX) 17 g packet Take 17 g by mouth daily. Patient taking differently: Take 17 g by mouth as needed. 12/11/22   Barnetta Chapel, MD  Allergies    Patient has no known allergies.    Review of Systems   Review of Systems  Physical Exam Updated Vital Signs BP (!) 149/89 (BP Location: Right Arm)   Pulse 79   Temp 97.9 F (36.6 C) (Oral)   Resp 18   Ht 6\' 3"  (1.905 m)   Wt 95.8 kg   SpO2 98%   BMI 26.40 kg/m  Physical Exam Vitals and nursing note reviewed.  Constitutional:      Appearance: He is well-developed.  HENT:     Head: Normocephalic and atraumatic.  Cardiovascular:     Rate and Rhythm: Normal rate.  Pulmonary:     Effort: Pulmonary effort is normal. No respiratory distress.  Abdominal:     General: There is  no distension.  Musculoskeletal:        General: Normal range of motion.     Cervical back: Normal range of motion.  Skin:    General: Skin is warm and dry.  Neurological:     General: No focal deficit present.     Mental Status: He is alert.     ED Results / Procedures / Treatments   Labs (all labs ordered are listed, but only abnormal results are displayed) Labs Reviewed  COMPREHENSIVE METABOLIC PANEL - Abnormal; Notable for the following components:      Result Value   Sodium 133 (*)    CO2 19 (*)    Glucose, Bld 400 (*)    Creatinine, Ser 1.67 (*)    Calcium 8.8 (*)    GFR, Estimated 42 (*)    All other components within normal limits  CBC - Abnormal; Notable for the following components:   MCV 73.1 (*)    MCH 24.8 (*)    RDW 15.7 (*)    All other components within normal limits  URINALYSIS, ROUTINE W REFLEX MICROSCOPIC - Abnormal; Notable for the following components:   Color, Urine STRAW (*)    Glucose, UA >=500 (*)    Ketones, ur 20 (*)    All other components within normal limits  CBG MONITORING, ED - Abnormal; Notable for the following components:   Glucose-Capillary 374 (*)    All other components within normal limits  TROPONIN I (HIGH SENSITIVITY) - Abnormal; Notable for the following components:   Troponin I (High Sensitivity) 30 (*)    All other components within normal limits    EKG None  Radiology CT Head Wo Contrast  Result Date: 04/20/2023 CLINICAL DATA:  Mental status change, unknown cause EXAM: CT HEAD WITHOUT CONTRAST TECHNIQUE: Contiguous axial images were obtained from the base of the skull through the vertex without intravenous contrast. RADIATION DOSE REDUCTION: This exam was performed according to the departmental dose-optimization program which includes automated exposure control, adjustment of the mA and/or kV according to patient size and/or use of iterative reconstruction technique. COMPARISON:  None Available. FINDINGS: Brain: No acute  intracranial abnormality. Specifically, no hemorrhage, hydrocephalus, mass lesion, acute infarction, or significant intracranial injury. Age related atrophy. Vascular: No hyperdense vessel or unexpected calcification. Skull: No acute calvarial abnormality. Sinuses/Orbits: No acute findings Other: None IMPRESSION: No acute intracranial abnormality. Electronically Signed   By: Charlett Nose M.D.   On: 04/20/2023 03:34   DG Chest Portable 1 View  Result Date: 04/20/2023 CLINICAL DATA:  Evaluate for infection EXAM: PORTABLE CHEST 1 VIEW COMPARISON:  12/03/2022 FINDINGS: The heart size and mediastinal contours are within normal limits. Both lungs are clear. The visualized skeletal structures  are unremarkable. IMPRESSION: No active disease. Electronically Signed   By: Charlett Nose M.D.   On: 04/20/2023 03:24    Procedures Procedures    Medications Ordered in ED Medications  insulin aspart (novoLOG) injection 5 Units (has no administration in time range)  lactated ringers bolus 1,000 mL (1,000 mLs Intravenous New Bag/Given 04/20/23 0402)  insulin aspart (novoLOG) injection 10 Units (10 Units Intravenous Given 04/20/23 0403)    ED Course/ Medical Decision Making/ A&P                             Medical Decision Making Amount and/or Complexity of Data Reviewed Labs: ordered. Radiology: ordered.  Risk OTC drugs. Prescription drug management.  Hyperglycemia with mild low bicarb, but normal anion gap. Ketones in urine. Will workup for causes. Hyperglycemia improving (his continuous monitor showing 350, dwon from >400), will give a second dose of aspartand will cotninue to monitor at home.    Final Clinical Impression(s) / ED Diagnoses Final diagnoses:  Hyperglycemia    Rx / DC Orders ED Discharge Orders     None         Newt Levingston, Barbara Cower, MD 04/20/23 [phone]

## 2023-04-20 NOTE — ED Notes (Signed)
Wife left around 1130, stated "I'll be back in a little while". Pt agitated and restless. Unable to redirect him. Pt attempting to leave rm. PCT and this RN at bedside due to pt having dementia and being fall risk. Wife contacted and asked to return per EDP. Wife states "I guess I can come back. I'll be there in 20 mins". This RN will remain at bedside until wife returns.

## 2023-04-20 NOTE — ED Notes (Signed)
Wife back at bedside.

## 2023-04-20 NOTE — ED Notes (Signed)
Patient's personal glucometer rechecked after insulin administration CBG 377. Personal glucometer used per Dr. Clayborne Dana

## 2023-04-20 NOTE — ED Notes (Signed)
Patient has a home insulin device in place, asked Dr. Clayborne Dana if he wants the device removed, Dr. Clayborne Dana ordered to "leave the patient's insulin device in place and to use the patient's personal glucometer and glucometer reading for initial insulin administration, and for 1 hour after insulin administration, if patient's personal glucometer continues to read greater than 400 use hospital glucometer for CBG"

## 2023-04-20 NOTE — ED Notes (Signed)
Pt found wandering in hall way. Wife is at bedside. Wife states "He was trying to leave". Pt redirected into room.

## 2023-04-21 ENCOUNTER — Telehealth: Payer: Self-pay | Admitting: Nutrition

## 2023-04-21 ENCOUNTER — Telehealth: Payer: Self-pay | Admitting: Adult Health

## 2023-04-21 NOTE — Telephone Encounter (Signed)
Wife called saying husband's blood sugar started to go high after supper Saturday evening.  She gave one correction dose, and it did not come down, but went to "Hi".  She was about to change out pod, when husband insisted that they to go the hospital.  The ER gave him injections of insulin, and on Sunday morning.  She left him at the hospital, and went to church.  She got a call saying he was very combative, and she needed to return.  She went home first to get a new pod, and this was put on him.  They released the patient, but when they got home, the ER MD called saying that his Troponin level was high and he needed to come back.Patient refused. We reviewed the high blood sugar protocol, and she knows what to do.  Pt. Was not cooperative.  Blood sugar was 289 at 7:30 and she did a correction dose.  He is still asleep, and blood sugar is now 182.   Told her she is doing great, but that she needs to call his heart doctor to explain what happened at the ER.  She agreed to do this.

## 2023-04-21 NOTE — Telephone Encounter (Signed)
Called patient to schedule Medicare Annual Wellness Visit (AWV). No voicemail available to leave a message.  Last date of AWV: 12/14/21  Please schedule an appointment at any time with [facility] or [employer].  If any questions, please contact me at [phone].  Thank you ,  Rudell Cobb AWV direct phone # [phone]

## 2023-04-23 ENCOUNTER — Encounter: Payer: Self-pay | Admitting: Adult Health

## 2023-04-23 ENCOUNTER — Ambulatory Visit (INDEPENDENT_AMBULATORY_CARE_PROVIDER_SITE_OTHER): Payer: Medicare HMO | Admitting: Adult Health

## 2023-04-23 VITALS — BP 122/68 | HR 80 | Temp 98.1°F | Ht 75.0 in | Wt 211.0 lb

## 2023-04-23 DIAGNOSIS — E108 Type 1 diabetes mellitus with unspecified complications: Secondary | ICD-10-CM | POA: Diagnosis not present

## 2023-04-23 DIAGNOSIS — R2681 Unsteadiness on feet: Secondary | ICD-10-CM

## 2023-04-23 DIAGNOSIS — R7989 Other specified abnormal findings of blood chemistry: Secondary | ICD-10-CM

## 2023-04-23 NOTE — Progress Notes (Signed)
Subjective:    Patient ID: Christopher [name], male    DOB: Feb 26, 1946, 77 y.o.   MRN: [id number]  HPI 77 year old male who  has a past medical history of Diabetes mellitus type II, GERD (gastroesophageal reflux disease), Glaucoma, Hyperlipidemia, and Hypertension.  He presents with his wife today for follow up after being seen in the ER   He went to the emergency room at [facility] for elevated blood sugars above 400.  He had no associated symptoms.  He had not been recently.  He did have an episode yesterday where he had trouble getting his words out but this resolved spontaneously.  No fevers.  Does use an insulin pump to manage his insulin.  The ER he was given correction doses of insulin, blood sugar down to 350 upon discharge.  After discharge it was noted that his troponins were trending up at 30, 50, and 69.  He was advised to return to the ER but refused to do so.  He does have a history of NSTEMI in January 2024 he was hospitalized.  He was not having chest pain during this visit.  Did contact endocrinology who advised that he may have a faulty insulin provide, insulin pod was changed.  Was also advised to follow-up with his cardiologist and has an appointment with cardiology tomorrow.  He has not had any chest pain since being discharged.   Since being home his blood sugars have started to regulate. He did have a episode of his blood sugars going back up to 400 but the pod was missing, they replaced the pod and blood sugars have started to regulate. He has had a low blood sugar of 80.   He has an appointment with Endocrinology on May 15th..   He and his wife would like for him to do some outpatient PT for gait instability. He had home health PT after a hospital admission in December and felt as though this helped with strengthening but he still feels unsteady with walking. He has not fallen   Review of Systems See HPI   Past Medical History:  Diagnosis Date   Diabetes  mellitus type II    GERD (gastroesophageal reflux disease)    Glaucoma    Hyperlipidemia    Hypertension     Social History   Socioeconomic History   Marital status: Married    Spouse name: Not on file   Number of children: Not on file   Years of education: Not on file   Highest education level: Not on file  Occupational History   Not on file  Tobacco Use   Smoking status: Never   Smokeless tobacco: Never  Vaping Use   Vaping Use: Never used  Substance and Sexual Activity   Alcohol use: No   Drug use: No   Sexual activity: Not on file  Other Topics Concern   Not on file  Social History Narrative   He enjoys playing golf    Social Determinants of Health   Financial Resource Strain: Not on file  Food Insecurity: No Food Insecurity (10/06/2022)   Hunger Vital Sign    Worried About Running Out of Food in the Last Year: Never true    Ran Out of Food in the Last Year: Never true  Transportation Needs: No Transportation Needs (10/06/2022)   PRAPARE - Administrator, Civil Service (Medical): No    Lack of Transportation (Non-Medical): No  Physical Activity: Sufficiently Active (  05/10/2020)   Exercise Vital Sign    Days of Exercise per Week: 5 days    Minutes of Exercise per Session: 60 min  Stress: Not on file  Social Connections: Not on file  Intimate Partner Violence: Not At Risk (10/06/2022)   Humiliation, Afraid, Rape, and Kick questionnaire    Fear of Current or Ex-Partner: No    Emotionally Abused: No    Physically Abused: No    Sexually Abused: No    Past Surgical History:  Procedure Laterality Date   COLONOSCOPY     HERNIA REPAIR     PATELLA FRACTURE SURGERY  1993    Family History  Problem Relation Age of Onset   Alcohol abuse Father    Diabetes Sister    Diabetes Brother    Diabetes Brother    Colon cancer Neg Hx    Esophageal cancer Neg Hx    Stomach cancer Neg Hx    Rectal cancer Neg Hx     No Known Allergies  Current  Outpatient Medications on File Prior to Visit  Medication Sig Dispense Refill   atorvastatin (LIPITOR) 80 MG tablet Take 1 tablet (80 mg total) by mouth at bedtime. 90 tablet 3   brimonidine (ALPHAGAN) 0.15 % ophthalmic solution Place 1 drop into both eyes 2 (two) times daily.     Continuous Blood Gluc Receiver (DEXCOM G7 RECEIVER) DEVI Use to check blood sugar 1 each 0   Continuous Blood Gluc Sensor (DEXCOM G6 SENSOR) MISC Change every 10 days 9 each 2   Continuous Blood Gluc Sensor (FREESTYLE LIBRE 2 SENSOR) MISC 1 Device by Does not apply route every 14 (fourteen) days. 6 each 3   Continuous Blood Gluc Transmit (DEXCOM G6 TRANSMITTER) MISC Use every 3 months 1 each 2   docusate sodium (COLACE) 100 MG capsule Take 1 capsule (100 mg total) by mouth 2 (two) times daily. (Patient taking differently: Take 100 mg by mouth as needed.) 10 capsule 0   dorzolamide-timolol (COSOPT) 22.3-6.8 MG/ML ophthalmic solution Place 1 drop into both eyes 2 (two) times daily.      glucose blood (ONETOUCH VERIO) test strip 1 each by Other route 2 (two) times daily. And lancets 2/day 200 each 3   insulin aspart (NOVOLOG) 100 UNIT/ML injection Up to 50 units daily in insulin pump 20 mL 2   Insulin Disposable Pump (OMNIPOD 5 G6 INTRO, GEN 5,) KIT Inject into the skin.     Insulin Disposable Pump (OMNIPOD 5 G6 PODS, GEN 5,) MISC SMARTSIG:SUB-Q Every 3 Days 10 each 5   Insulin Pen Needle 32G X 4 MM MISC Use as instructed to administer insulin 4-5X daily. E10.65 400 each 1   Insulin Syringe-Needle U-100 (BD VEO INSULIN SYRINGE U/F) 31G X 15/64" 1 ML MISC USE  SYRINGE TWICE DAILY 200 each 5   Lancets (ONETOUCH ULTRASOFT) lancets Use to check blood sugar 3 times a day 100 each 12   lisinopril (ZESTRIL) 2.5 MG tablet Take 1 tablet (2.5 mg total) by mouth daily. 90 tablet 3   Multiple Vitamins-Minerals (MULTIVITAMIN ADULTS 50+) TABS Take 1 tablet by mouth daily.     polyethylene glycol (MIRALAX / GLYCOLAX) 17 g packet Take 17 g  by mouth daily. (Patient taking differently: Take 17 g by mouth as needed.) 14 each 0   No current facility-administered medications on file prior to visit.    BP 122/68   Pulse 80   Temp 98.1 F (36.7 C) (Oral)   Ht 6'  3" (1.905 m)   Wt 211 lb (95.7 kg)   SpO2 98%   BMI 26.37 kg/m       Objective:   Physical Exam Vitals and nursing note reviewed.  Constitutional:      Appearance: Normal appearance.  Cardiovascular:     Rate and Rhythm: Normal rate and regular rhythm.     Pulses: Normal pulses.     Heart sounds: Normal heart sounds.  Pulmonary:     Effort: Pulmonary effort is normal.     Breath sounds: Normal breath sounds.  Musculoskeletal:        General: Normal range of motion.  Skin:    General: Skin is warm.  Neurological:     General: No focal deficit present.     Mental Status: He is alert and oriented to person, place, and time.  Psychiatric:        Mood and Affect: Mood normal.        Behavior: Behavior normal.        Thought Content: Thought content normal.        Judgment: Judgment normal.       Assessment & Plan:  1. Type 1 diabetes mellitus with unspecified complications (HCC) - Follow up with Endocrinology as directed - Continue to monitor BS   2. Gait instability  - Ambulatory referral to Physical Therapy  3. Elevated troponin - Follow up with Cardiology tomorrow   Shirline Frees, NP

## 2023-04-23 NOTE — Progress Notes (Signed)
Cardiology Office Note:    Date:  04/25/2023   ID:  Christopher [name], DOB [birth year], MRN [id number]  PCP:  Shirline Frees, NP   [facility] Providers Cardiologist:  Lance Muss, MD     Referring MD: Shirline Frees, NP   Chief Complaint: elevated troponin  History of Present Illness:    Christopher [name] is a very pleasant 77 y.o. male with a hx of HTN, type 1/2 diabetes on insulin pump, hyperlipidemia, and GERD.   Referred to cardiology and seen by Dr. Eldridge Dace on 03/25/23.  He had elevated troponin during an episode of DKA in the hospital December 2023.  He had acute renal failure with creatinine up to 3.3 which did improve.  Echocardiogram showed LVEF 50 to 55%, no regional wall motion abnormality, moderate LVH, G1 DD, mildly reduced RV function, mild MR. Stress test January 2024 consistent with no prior ischemia and no prior myocardial infarction, low risk study.  Elevated troponin felt to be demand ischemia. ACEi was restarted 03/09/23. He was encouraged to work on healthy diet and achieving 150 minutes moderate intensity exercise each week. Advised to return in 1 year for follow-up.  ED visit 04/20/2023 with hyperglycemia.  Blood sugar was 374 then 400.  Troponins were increased at 30 >>50 >>69. His blood sugar improved with fluid and insulin administration. He was discharged in error prior to completing work up for elevated troponins.   Today, he is here with his wife for ED follow-up. Reports he is feeling well.  He does not recall the specifics of the ED visit.  It was discovered that his insulin pump was not working. He has resumed regular activities. Push mows the lawn each week. He denies chest pain, shortness of breath, fatigue, palpitations, melena, hematuria, hemoptysis, diaphoresis, weakness, presyncope, syncope, orthopnea, and PND. Occasionally notes mild LE edema but this does not happen regularly. Diabetes originally diagnosed as type 2 about 20-30 years ago but he was later  told it may be type 1 and he received insulin pump in February 2024. Since that time, blood sugar has been very well controlled with the exception of 5/5 as noted above when pump was not working.   Past Medical History:  Diagnosis Date   Diabetes mellitus type II    GERD (gastroesophageal reflux disease)    Glaucoma    Hyperlipidemia    Hypertension     Past Surgical History:  Procedure Laterality Date   COLONOSCOPY     HERNIA REPAIR     PATELLA FRACTURE SURGERY  1993    Current Medications: Current Meds  Medication Sig   atorvastatin (LIPITOR) 80 MG tablet Take 1 tablet (80 mg total) by mouth at bedtime.   brimonidine (ALPHAGAN) 0.15 % ophthalmic solution Place 1 drop into both eyes 2 (two) times daily.   Continuous Blood Gluc Sensor (FREESTYLE LIBRE 2 SENSOR) MISC 1 Device by Does not apply route every 14 (fourteen) days.   Continuous Blood Gluc Transmit (DEXCOM G6 TRANSMITTER) MISC Use every 3 months   docusate sodium (COLACE) 100 MG capsule Take 1 capsule (100 mg total) by mouth 2 (two) times daily. (Patient taking differently: Take 100 mg by mouth as needed.)   dorzolamide-timolol (COSOPT) 22.3-6.8 MG/ML ophthalmic solution Place 1 drop into both eyes 2 (two) times daily.    glucose blood (ONETOUCH VERIO) test strip 1 each by Other route 2 (two) times daily. And lancets 2/day   insulin aspart (NOVOLOG) 100 UNIT/ML injection Up to 50 units  daily in insulin pump   Insulin Disposable Pump (OMNIPOD 5 G6 INTRO, GEN 5,) KIT Inject into the skin.   Insulin Disposable Pump (OMNIPOD 5 G6 PODS, GEN 5,) MISC SMARTSIG:SUB-Q Every 3 Days   Insulin Pen Needle 32G X 4 MM MISC Use as instructed to administer insulin 4-5X daily. E10.65   Insulin Syringe-Needle U-100 (BD VEO INSULIN SYRINGE U/F) 31G X 15/64" 1 ML MISC USE  SYRINGE TWICE DAILY   Lancets (ONETOUCH ULTRASOFT) lancets Use to check blood sugar 3 times a day   lisinopril (ZESTRIL) 2.5 MG tablet Take 1 tablet (2.5 mg total) by mouth  daily.   Multiple Vitamins-Minerals (MULTIVITAMIN ADULTS 50+) TABS Take 1 tablet by mouth daily.   polyethylene glycol (MIRALAX / GLYCOLAX) 17 g packet Take 17 g by mouth daily. (Patient taking differently: Take 17 g by mouth as needed.)     Allergies:   Patient has no known allergies.   Social History   Socioeconomic History   Marital status: Married    Spouse name: Not on file   Number of children: Not on file   Years of education: Not on file   Highest education level: Not on file  Occupational History   Not on file  Tobacco Use   Smoking status: Never   Smokeless tobacco: Never  Vaping Use   Vaping Use: Never used  Substance and Sexual Activity   Alcohol use: No   Drug use: No   Sexual activity: Not on file  Other Topics Concern   Not on file  Social History Narrative   He enjoys playing golf    Social Determinants of Health   Financial Resource Strain: Not on file  Food Insecurity: No Food Insecurity (04/24/2023)   Hunger Vital Sign    Worried About Running Out of Food in the Last Year: Never true    Ran Out of Food in the Last Year: Never true  Transportation Needs: No Transportation Needs (04/24/2023)   PRAPARE - Administrator, Civil Service (Medical): No    Lack of Transportation (Non-Medical): No  Physical Activity: Sufficiently Active (05/10/2020)   Exercise Vital Sign    Days of Exercise per Week: 5 days    Minutes of Exercise per Session: 60 min  Stress: Not on file  Social Connections: Not on file     Family History: The patient's family history includes Alcohol abuse in his father; Diabetes in his brother, brother, and sister. There is no history of Colon cancer, Esophageal cancer, Stomach cancer, or Rectal cancer.  ROS:   Please see the history of present illness.  All other systems reviewed and are negative.  Labs/Other Studies Reviewed:    The following studies were reviewed today:  Lexiscan Myoview 12/17/22   Findings are consistent  with no prior ischemia and no prior myocardial infarction. The study is low risk.   No ST deviation was noted.   Left ventricular function is abnormal. Global function is mildly reduced. Nuclear stress EF: 50 %. The left ventricular ejection fraction is mildly decreased (45-54%). End diastolic cavity size is mildly enlarged. End systolic cavity size is normal.   Prior study not available for comparison.   Negative stress test. Low normal ejection fraction.    Echo 12/04/22 1. Left ventricular ejection fraction, by estimation, is 50 to 55%. The  left ventricle has low normal function. The left ventricle has no regional  wall motion abnormalities. There is moderate left ventricular hypertrophy.  Left ventricular  diastolic  parameters are consistent with Grade I diastolic dysfunction (impaired  relaxation).   2. Right ventricular systolic function is mildly reduced. The right  ventricular size is normal. There is normal pulmonary artery systolic  pressure. The estimated right ventricular systolic pressure is 21.4 mmHg.   3. The mitral valve is abnormal. Mild mitral valve regurgitation.   4. The aortic valve is tricuspid. Aortic valve regurgitation is not  visualized. Aortic valve sclerosis is present, with no evidence of aortic  valve stenosis.   5. The inferior vena cava is dilated in size with >50% respiratory  variability, suggesting right atrial pressure of 8 mmHg.   Comparison(s): No prior Echocardiogram.   Recent Labs: 12/10/2022: Magnesium 1.7 03/20/2023: TSH 1.25 04/19/2023: ALT 17; BUN 21; Creatinine, Ser 1.67; Hemoglobin 13.7; Platelets 230; Potassium 4.6; Sodium 133  Recent Lipid Panel    Component Value Date/Time   CHOL 214 (H) 03/20/2023 0832   TRIG 67.0 03/20/2023 0832   HDL 51.20 03/20/2023 0832   CHOLHDL 4 03/20/2023 0832   VLDL 13.4 03/20/2023 0832   LDLCALC 149 (H) 03/20/2023 0832   LDLDIRECT 135.3 06/17/2013 0838     Risk Assessment/Calculations:            Physical Exam:    VS:  BP (!) 150/92   Pulse 75   Ht 6\' 3"  (1.905 m)   Wt 211 lb 9.6 oz (96 kg)   SpO2 98%   BMI 26.45 kg/m     Wt Readings from Last 3 Encounters:  04/24/23 211 lb 9.6 oz (96 kg)  04/23/23 211 lb (95.7 kg)  04/19/23 211 lb 3.2 oz (95.8 kg)     GEN:  Well nourished, well developed in no acute distress HEENT: Normal NECK: No JVD; No carotid bruits CARDIAC: RRR, no murmurs, rubs, gallops RESPIRATORY:  Clear to auscultation without rales, wheezing or rhonchi  ABDOMEN: Soft, non-tender, non-distended MUSCULOSKELETAL:  No edema; No deformity. 2+ pedal pulses, equal bilaterally SKIN: Warm and dry NEUROLOGIC:  Alert and oriented x 3 PSYCHIATRIC:  Normal affect   EKG:  EKG is not ordered today.    HYPERTENSION CONTROL Vitals:   04/24/23 1414 04/24/23 1500  BP: (!) 142/98 (!) 150/92    The patient's blood pressure is elevated above target today.  In order to address the patient's elevated BP: Blood pressure will be monitored at home to determine if medication changes need to be made.       Diagnoses:    1. Essential hypertension   2. Hyperlipidemia LDL goal <70   3. Elevated troponin   4. Heart failure with mildly reduced ejection fraction (HFmrEF) (HCC)    Assessment and Plan:     Elevated troponin: Elevated troponin 5/5 of 30 >> 50 >> 69 in the setting of significant hyperglycemia. He denies chest pain, dyspnea, or other symptoms concerning for angina. Normal nuclear stress test 12/2022 following admission for DKA/elevated troponins. He is active, push mowing lawn each week. Has not had any chest pain, dyspnea, or concerning symptoms with exertion. No indication for further ischemic evaluation at this time.   Hypertension: BP is elevated today. Wife reports it is usually well controlled.  Review of records in the EMR revealed no recent elevated readings at other provider appointments. They will get a home cuff and monitor on a consistent basis.  We  discussed increasing lisinopril to 5 or 10 mg daily if BP remains above 140/80. He sees PCP regularly. Advised that management of hypertension can  be with PCP if they prefer.   Hyperlipidemia: LDL 149 on 03/20/2023, remains significantly elevated since starting atorvastatin. He admits he does not take it regularly. Advised that LDL goal is < 70. I recommended repeat fasting lipid panel after 2-3 months of consistent therapy on statin.   HFmrEF: LVEF 50 to 55%, moderate LVH, G1 DD, mildly reduced RV function on echo 12/04/2022. He has mild occasional lower extremity swelling that resolves overnight.  No dyspnea, orthopnea, PND.  He has resumed lisinopril 2.5 mg for elevated BP. BP is elevated today. I have asked him to get home monitor and notify us or PCP or endocrinologist if BP remains elevated > 140 or > 80. We discussed potential to increase lisinopril since he is on lowest dose and the potential to add additional anti-hypertensives that can improve heart function. Advised him to notify us if he has concerning symptoms of shortness of breath, DOE, worsening edema, orthopnea, or PND prior to next appointment.     Disposition: 1 year with Dr. Eldridge Dace  Medication Adjustments/Labs and Tests Ordered: Current medicines are reviewed at length with the patient today.  Concerns regarding medicines are outlined above.  No orders of the defined types were placed in this encounter.  No orders of the defined types were placed in this encounter.   Patient Instructions  Medication Instructions:   Your physician recommends that you continue on your current medications as directed. Please refer to the Current Medication list given to you today.  It is recommended to increase atorvastatin to daily and recheck fasting lipids in 6 months with your PCP.   *If you need a refill on your cardiac medications before your next appointment, please call your pharmacy*   Lab Work:  None ordered.  If you have labs  (blood work) drawn today and your tests are completely normal, you will receive your results only by: MyChart Message (if you have MyChart) OR A paper copy in the mail If you have any lab test that is abnormal or we need to change your treatment, we will call you to review the results.   Testing/Procedures:  None ordered.   Follow-Up: At [facility], you and your health needs are our priority.  As part of our continuing mission to provide you with exceptional heart care, we have created designated Provider Care Teams.  These Care Teams include your primary Cardiologist (physician) and Advanced Practice Providers (APPs -  Physician Assistants and Nurse Practitioners) who all work together to provide you with the care you need, when you need it.  We recommend signing up for the patient portal called "MyChart".  Sign up information is provided on this After Visit Summary.  MyChart is used to connect with patients for Virtual Visits (Telemedicine).  Patients are able to view lab/test results, encounter notes, upcoming appointments, etc.  Non-urgent messages can be sent to your provider as well.   To learn more about what you can do with MyChart, go to [email or web address].    Your next appointment:   1 year(s)  Provider:   Lance Muss, MD     Other Instructions  Your physician wants you to follow-up in: 1 year with Dr. Eldridge Dace.  You will receive a reminder letter in the mail two months in advance. If you don't receive a letter, please call our office to schedule the follow-up appointment.  HOW TO TAKE YOUR BLOOD PRESSURE  Rest 5 minutes before taking your blood pressure. Don't  smoke or drink caffeinated  beverages for at least 30 minutes before. Take your blood pressure before (not after) you eat. Sit comfortably with your back supported and both feet on the floor ( don't cross your legs). Elevate your arm to heart level on a table or a desk. Use the proper sized  cuff.  It should fit smoothly and snugly around your bare upper arm.  There should be  Enough room to slip a fingertip under the cuff.  The bottom edge of the cuff should be 1 inch above the crease Of the elbow. Please monitor your blood pressure once daily 2 hours after your am medication. If you blood pressure Consistently remains above 140 (systolic) top number or over 80 ( diastolic) bottom number X 3 days  Consecutively.  Please call our office at [phone] or send Mychart message.     ----Avoid cold medicines with D or DM at the end of them----        Signed, Levi Aland, NP  04/25/2023 9:17 AM    [facility]

## 2023-04-24 ENCOUNTER — Encounter: Payer: Self-pay | Admitting: Nurse Practitioner

## 2023-04-24 ENCOUNTER — Ambulatory Visit: Payer: Medicare HMO | Attending: Nurse Practitioner | Admitting: Nurse Practitioner

## 2023-04-24 ENCOUNTER — Telehealth: Payer: Self-pay

## 2023-04-24 VITALS — BP 150/92 | HR 75 | Ht 75.0 in | Wt 211.6 lb

## 2023-04-24 DIAGNOSIS — R7989 Other specified abnormal findings of blood chemistry: Secondary | ICD-10-CM | POA: Diagnosis not present

## 2023-04-24 DIAGNOSIS — E785 Hyperlipidemia, unspecified: Secondary | ICD-10-CM

## 2023-04-24 DIAGNOSIS — I10 Essential (primary) hypertension: Secondary | ICD-10-CM | POA: Diagnosis not present

## 2023-04-24 DIAGNOSIS — I5022 Chronic systolic (congestive) heart failure: Secondary | ICD-10-CM

## 2023-04-24 NOTE — Patient Instructions (Signed)
Medication Instructions:   Your physician recommends that you continue on your current medications as directed. Please refer to the Current Medication list given to you today.  It is recommended to increase atorvastatin to daily and recheck fasting lipids in 6 months with your PCP.   *If you need a refill on your cardiac medications before your next appointment, please call your pharmacy*   Lab Work:  None ordered.  If you have labs (blood work) drawn today and your tests are completely normal, you will receive your results only by: MyChart Message (if you have MyChart) OR A paper copy in the mail If you have any lab test that is abnormal or we need to change your treatment, we will call you to review the results.   Testing/Procedures:  None ordered.   Follow-Up: At [facility], you and your health needs are our priority.  As part of our continuing mission to provide you with exceptional heart care, we have created designated Provider Care Teams.  These Care Teams include your primary Cardiologist (physician) and Advanced Practice Providers (APPs -  Physician Assistants and Nurse Practitioners) who all work together to provide you with the care you need, when you need it.  We recommend signing up for the patient portal called "MyChart".  Sign up information is provided on this After Visit Summary.  MyChart is used to connect with patients for Virtual Visits (Telemedicine).  Patients are able to view lab/test results, encounter notes, upcoming appointments, etc.  Non-urgent messages can be sent to your provider as well.   To learn more about what you can do with MyChart, go to [email or web address].    Your next appointment:   1 year(s)  Provider:   Lance Muss, MD     Other Instructions  Your physician wants you to follow-up in: 1 year with Dr. Eldridge Dace.  You will receive a reminder letter in the mail two months in advance. If you don't receive a letter, please  call our office to schedule the follow-up appointment.  HOW TO TAKE YOUR BLOOD PRESSURE  Rest 5 minutes before taking your blood pressure. Don't  smoke or drink caffeinated beverages for at least 30 minutes before. Take your blood pressure before (not after) you eat. Sit comfortably with your back supported and both feet on the floor ( don't cross your legs). Elevate your arm to heart level on a table or a desk. Use the proper sized cuff.  It should fit smoothly and snugly around your bare upper arm.  There should be  Enough room to slip a fingertip under the cuff.  The bottom edge of the cuff should be 1 inch above the crease Of the elbow. Please monitor your blood pressure once daily 2 hours after your am medication. If you blood pressure Consistently remains above 140 (systolic) top number or over 80 ( diastolic) bottom number X 3 days  Consecutively.  Please call our office at [phone] or send Mychart message.     ----Avoid cold medicines with D or DM at the end of them----

## 2023-04-24 NOTE — Transitions of Care (Post Inpatient/ED Visit) (Signed)
04/24/2023  Name: Christopher [name] MRN: [id number] DOB: [birth year]  Today's TOC FU Call Status: Today's TOC FU Call Status:: Successful TOC FU Call Competed TOC FU Call Complete Date: 04/24/23  Transition Care Management Follow-up Telephone Call Date of Discharge: 04/20/23 Discharge Facility: [facility]) Type of Discharge: Emergency Department Reason for ED Visit: Other: ("hyperglycemia") How have you been since you were released from the hospital?: Better (Call completed with spouse. She states patient is doing better. Blood sugars have been ranging in the 140s-150s.) Any questions or concerns?: No  Items Reviewed: Did you receive and understand the discharge instructions provided?: Yes Medications obtained,verified, and reconciled?: No Medications Not Reviewed Reasons:: Other: (spouse confirms they have all meds but she does not have meds with her at present as pt manages them) Any new allergies since your discharge?: No Dietary orders reviewed?: Yes Type of Diet Ordered:: low salt/heart healthy/carb modified Do you have support at home?: Yes People in Home: spouse Name of Support/Comfort Primary Source: [name]  Medications Reviewed Today: Medications Reviewed Today     Reviewed by Sherrin Daisy, CMA (Certified Medical Assistant) on 04/23/23 at 1503  Med List Status: <None>   Medication Order Taking? Sig Documenting Provider Last Dose Status Informant  atorvastatin (LIPITOR) 80 MG tablet [id number] Yes Take 1 tablet (80 mg total) by mouth at bedtime. Sharlene Dory, PA-C Taking Active   brimonidine (ALPHAGAN) 0.15 % ophthalmic solution [id number] Yes Place 1 drop into both eyes 2 (two) times daily. [provider] Taking Active Spouse/Significant Other, Pharmacy Records  Continuous Blood Gluc Receiver Vira Agar G7 [place]aughters) Hardie Pulley [id number] Yes Use to check blood sugar Reather Littler, MD Taking Active Spouse/Significant Other, Pharmacy Records  Continuous Blood Gluc Sensor  (DEXCOM G6 SENSOR) MISC [id number] Yes Change every 10 days Reather Littler, MD Taking Active Spouse/Significant Other, Pharmacy Records  Continuous Blood Gluc Sensor (FREESTYLE LIBRE 2 SENSOR) [place] [id number] Yes 1 Device by Does not apply route every 14 (fourteen) days. Philip Aspen, Limmie Patricia, MD Taking Active Spouse/Significant Other, Pharmacy Records  Continuous Blood Gluc Transmit (DEXCOM G6 TRANSMITTER) MISC [id number] Yes Use every 3 months Reather Littler, MD Taking Active Spouse/Significant Other, Pharmacy Records  docusate sodium (COLACE) 100 MG capsule [id number] Yes Take 1 capsule (100 mg total) by mouth 2 (two) times daily.  Patient taking differently: Take 100 mg by mouth as needed.   Barnetta Chapel, MD Taking Active   dorzolamide-timolol (COSOPT) 22.3-6.8 MG/ML ophthalmic solution [id number] Yes Place 1 drop into both eyes 2 (two) times daily.  [provider] Taking Active Spouse/Significant Other, Pharmacy Records  glucose blood (ONETOUCH VERIO) test strip [id number] Yes 1 each by Other route 2 (two) times daily. And lancets 2/day Romero Belling, MD Taking Active Spouse/Significant Other, Pharmacy Records  insulin aspart (NOVOLOG) 100 UNIT/ML injection [id number] Yes Up to 50 units daily in insulin pump Reather Littler, MD Taking Active   Insulin Disposable Pump (OMNIPOD 5 G6 INTRO, GEN 5,) KIT [id number] Yes Inject into the skin. [provider] Taking Active   Insulin Disposable Pump (OMNIPOD 5 G6 PODS, GEN 5,) MISC [id number] Yes SMARTSIG:SUB-Q Every 3 Days Reather Littler, MD Taking Active   Insulin Pen Needle 32G X 4 MM MISC [id number] Yes Use as instructed to administer insulin 4-5X daily. E10.65 Reather Littler, MD Taking Active Spouse/Significant Other, Pharmacy Records  Insulin Syringe-Needle U-100 (BD VEO INSULIN SYRINGE U/F) 31G X 15/64" 1 ML MISC [id number] Yes USE  SYRINGE TWICE DAILY Reather Littler, MD Taking  Active Spouse/Significant Other, Pharmacy Records  Lancets [facility]  ULTRASOFT) lancets [id number] Yes Use to check blood sugar 3 times a day Reather Littler, MD Taking Active   lisinopril (ZESTRIL) 2.5 MG tablet [id number] Yes Take 1 tablet (2.5 mg total) by mouth daily. Nafziger, Kandee Keen, NP Taking Active   Multiple Vitamins-Minerals (MULTIVITAMIN ADULTS 50+) TABS [id number] Yes Take 1 tablet by mouth daily. [provider] Taking Active Spouse/Significant Other, Pharmacy Records  polyethylene glycol (MIRALAX / GLYCOLAX) 17 g packet [id number] Yes Take 17 g by mouth daily.  Patient taking differently: Take 17 g by mouth as needed.   Barnetta Chapel, MD Taking Active             Home Care and Equipment/Supplies: Were Home Health Services Ordered?: NA Any new equipment or medical supplies ordered?: NA  Functional Questionnaire: Do you need assistance with bathing/showering or dressing?: No Do you need assistance with meal preparation?: No Do you need assistance with eating?: No Do you have difficulty maintaining continence: No Do you need assistance with getting out of bed/getting out of a chair/moving?: No Do you have difficulty managing or taking your medications?: No  Follow up appointments reviewed: PCP Follow-up appointment confirmed?: Yes Date of PCP follow-up appointment?: 04/23/23 Follow-up Provider: [facility] Follow-up appointment confirmed?: Yes Date of Specialist follow-up appointment?: 04/24/23 Follow-Up Specialty Provider:: Eligha Bridegroom Do you need transportation to your follow-up appointment?: No Do you understand care options if your condition(s) worsen?: Yes-patient verbalized understanding  SDOH Interventions Today    Flowsheet Row Most Recent Value  SDOH Interventions   Food Insecurity Interventions Intervention Not Indicated  Transportation Interventions Intervention Not Indicated      TOC Interventions Today    Flowsheet Row Most Recent Value  TOC Interventions   TOC Interventions  Discussed/Reviewed TOC Interventions Discussed      Interventions Today    Flowsheet Row Most Recent Value  Chronic Disease   Chronic disease during today's visit Diabetes  General Interventions   General Interventions Discussed/Reviewed General Interventions Discussed, Durable Medical Equipment (DME), Doctor Visits  Doctor Visits Discussed/Reviewed Doctor Visits Discussed, PCP, Specialist  Durable Medical Equipment (DME) Glucomoter  PCP/Specialist Visits Compliance with follow-up visit  Education Interventions   Education Provided Provided Education  Provided Verbal Education On Nutrition, When to see the doctor, Blood Sugar Monitoring, Medication, Other  Nutrition Interventions   Nutrition Discussed/Reviewed Nutrition Discussed, Adding fruits and vegetables, Decreasing salt, Decreasing sugar intake  Pharmacy Interventions   Pharmacy Dicussed/Reviewed Pharmacy Topics Discussed, Medications and their functions  Safety Interventions   Safety Discussed/Reviewed Safety Discussed       Alessandra Grout [facility] Care Management Care Management Community Coordinator Direct Phone: [phone] Toll Free: [phone] Fax: [phone]

## 2023-04-25 ENCOUNTER — Encounter: Payer: Self-pay | Admitting: Nurse Practitioner

## 2023-04-30 ENCOUNTER — Ambulatory Visit: Payer: Medicare HMO | Admitting: Endocrinology

## 2023-04-30 VITALS — BP 138/82 | HR 76 | Resp 16 | Ht 75.0 in | Wt 212.4 lb

## 2023-04-30 DIAGNOSIS — E1065 Type 1 diabetes mellitus with hyperglycemia: Secondary | ICD-10-CM | POA: Diagnosis not present

## 2023-04-30 LAB — POCT GLYCOSYLATED HEMOGLOBIN (HGB A1C): Hemoglobin A1C: 6.6 % — AB (ref 4.0–5.6)

## 2023-04-30 NOTE — Progress Notes (Signed)
Patient ID: Christopher [name], male   DOB: Apr 24, 1946, 77 y.o.   MRN: [id number]    Chief complaint : Follow up of Type 1 Diabetes  History of Present Illness:          Date of diagnosis: 2010  Prior history:  Previous A1c range has been 8.4-11.6   Therapy: insulin since 2013.   DKA: Twice (2023) Severe hypoglycemia: last episode was in 11/2022.  Non-insulin hypoglycemic drugs: Previously had been on Trulicity and Victoza which were stopped Previous insulins tried include Levemir and NPH  INSULIN regimen, previously: Lantus 38--20 units bid Novolog FlexPen before meals: 10 units before meals  OMNIPOD 5 pump settings:  BASAL rate: 1.15, I/C ratio 1: 1 with mealtime doses 8 units breakfast and 6 units before lunch and dinner  ISF: 40, timing 4 hours, target: 120 with correction over 130.   Recent history:     A1c is at 6.6, was 8.9 previously  He is continuing the OmniPod insulin pump with the help of his wife  He is now connected to the cloud with his insulin pump and details were evaluated including his boluses Also his Dexcom data was analyzed as below Generally his blood sugars are higher in the evenings after dinner but quite variable Some of his variability is likely related to late boluses after starting to eat Also at some meals he is eating more carbohydrates such as last night he had 3 types of carbohydrate but only took his usual 6 units bolus  Although he says he is eating his oatmeal only consistently as before his blood sugars sometimes are much higher including today and on 1 occasion was low normal Blood sugars may also vary sometimes after lunch which is usually smaller meal No hypoglycemia generally  His Dexcom CGM shows the following 1 to 2 weeks  Overnight blood sugars are generally very steady in the low 100 range, only once had low normal readings between 3 AM-6 AM, no hypoglycemia except likely an artifact once Premeal blood sugars at lunch are usually  excellent and has some variability Blood sugars at dinnertime are appearing to be modestly increased around 150-170 Postprandial readings after breakfast are high only on 2 or 3 occasions above 200 Blood sugars after dinner are quite variable and at least half the time may be above target for variable length of time No hypoglycemic during the day except low normal readings at 3 PM  CGM use % of time   2-week average/GV 179/45  Time in range 69     % was 58  % Time Above 180 16+15  % Time above 250   % Time Below 70 0     PREVIOUS data:  TIME IN RANGE = 58% Above 180 = 30% +12%   Symptoms of hypoglycemia: Weakness, shakiness        Self-care: The diet that the patient has been following is: Relatively low fat and carbohydrate May have sugar-free cookies at bedtime Breakfast may be oatmeal usually         Exercise: No consistent activity.          Most recent dietitian/nurse educator visit :    04/2022      Wt Readings from Last 3 Encounters:  04/30/23 212 lb 6.4 oz (96.3 kg)  04/24/23 211 lb 9.6 oz (96 kg)  04/23/23 211 lb (95.7 kg)    Diabetes labs:  Lab Results  Component Value Date   HGBA1C 6.6 (A) 04/30/2023  HGBA1C 8.9 (H) 12/05/2022   HGBA1C 10.1 (H) 08/12/2022   Lab Results  Component Value Date   MICROALBUR <0.7 05/20/2022   LDLCALC 149 (H) 03/20/2023   CREATININE 1.67 (H) 04/19/2023    Office Visit on 04/30/2023  Component Date Value Ref Range Status   Hemoglobin A1C 04/30/2023 6.6 (A)  4.0 - 5.6 % Final    Allergies as of 04/30/2023   No Known Allergies      Medication List        Accurate as of Apr 30, 2023 10:04 AM. If you have any questions, ask your nurse or doctor.          atorvastatin 80 MG tablet Commonly known as: LIPITOR Take 1 tablet (80 mg total) by mouth at bedtime.   BD Veo Insulin Syringe U/F 31G X 15/64" 1 ML Misc Generic drug: Insulin Syringe-Needle U-100 USE  SYRINGE TWICE DAILY   brimonidine 0.15 % ophthalmic  solution Commonly known as: ALPHAGAN Place 1 drop into both eyes 2 (two) times daily.   Dexcom G6 Transmitter Misc Use every 3 months   Dexcom G7 Receiver Devi Use to check blood sugar   docusate sodium 100 MG capsule Commonly known as: COLACE Take 1 capsule (100 mg total) by mouth 2 (two) times daily. What changed:  when to take this reasons to take this   dorzolamide-timolol 2-0.5 % ophthalmic solution Commonly known as: COSOPT Place 1 drop into both eyes 2 (two) times daily.   FreeStyle Libre 2 Sensor Misc 1 Device by Does not apply route every 14 (fourteen) days.   Dexcom G6 Sensor Misc Change every 10 days   insulin aspart 100 UNIT/ML injection Commonly known as: novoLOG Up to 50 units daily in insulin pump   Insulin Pen Needle 32G X 4 MM Misc Use as instructed to administer insulin 4-5X daily. E10.65   lisinopril 2.5 MG tablet Commonly known as: Zestril Take 1 tablet (2.5 mg total) by mouth daily.   Multivitamin Adults 50+ Tabs Take 1 tablet by mouth daily.   Omnipod 5 G6 Intro (Gen 5) Kit Inject into the skin.   Omnipod 5 G6 Pods (Gen 5) Misc SMARTSIG:SUB-Q Every 3 Days   onetouch ultrasoft lancets Use to check blood sugar 3 times a day   OneTouch Verio test strip Generic drug: glucose blood 1 each by Other route 2 (two) times daily. And lancets 2/day   polyethylene glycol 17 g packet Commonly known as: MIRALAX / GLYCOLAX Take 17 g by mouth daily. What changed:  when to take this reasons to take this        Allergies: No Known Allergies  Past Medical History:  Diagnosis Date   Diabetes mellitus type II    GERD (gastroesophageal reflux disease)    Glaucoma    Hyperlipidemia    Hypertension     Past Surgical History:  Procedure Laterality Date   COLONOSCOPY     HERNIA REPAIR     PATELLA FRACTURE SURGERY  1993    Family History  Problem Relation Age of Onset   Alcohol abuse Father    Diabetes Sister    Diabetes Brother     Diabetes Brother    Colon cancer Neg Hx    Esophageal cancer Neg Hx    Stomach cancer Neg Hx    Rectal cancer Neg Hx     Social History:  reports that he has never smoked. He has never used smokeless tobacco. He reports that he does not  drink alcohol and does not use drugs.    Review of Systems:   Blood pressure: Somewhat variable, only on 2.5 mg lisinopril  BP Readings from Last 3 Encounters:  04/30/23 138/82  04/24/23 (!) 150/92  04/23/23 122/68     Lipids: He has been treated with 80 mg Lipitor prescribed by other physicians  Lab Results  Component Value Date   CHOL 214 (H) 03/20/2023   HDL 51.20 03/20/2023   LDLCALC 149 (H) 03/20/2023   LDLDIRECT 135.3 06/17/2013   TRIG 67.0 03/20/2023   CHOLHDL 4 03/20/2023     Diabetes complications: None, no microalbuminuria Last eye exam 2/23 Last foot exam 10/22    Physical Examination:  BP 138/82   Pulse 76   Resp 16   Ht 6\' 3"  (1.905 m)   Wt 212 lb 6.4 oz (96.3 kg)   SpO2 99%   BMI 26.55 kg/m         ASSESSMENT/PLAN   Diabetes type 1 with persistently poor control   A1c previously over 8% and now 6.6  He is on the OmniPod 5 insulin pump Has been able to be in the automated mode 91% of the time recently  He is continuing to do better with the pump and his wife is able to help him with his boluses He does have some unexpectedly high sugars after some meals including breakfast but has difficulty understanding carbohydrate counting Discussed how different types of meals including relatively higher fat meals will affect his insulin requirement Timing of insulin was also emphasized Currently has been agreeable to avoid hypoglycemia and no excessive boluses    Recommendations:   Likely needs another 2 to 3 units at dinnertime for relatively higher carbohydrate meals or high fat content, otherwise continue base dose of 6 units Make sure he boluses before starting to eat especially at dinnertime Sensitivity 1:  35  May override the pump and enter 2 to 3 units extra for making corrections manually for blood sugars over 250 if there is insulin on board Target 130 overnight If not sure how much he will eat he can bolus right after finishing Make sure he stays in the automated mode is much as possible    Patient Instructions  Upto 8-9 units bolus                    total visit time including counseling = 30 minutes   Fillmore Bynum 04/30/2023, 10:04 AM 0

## 2023-04-30 NOTE — Patient Instructions (Addendum)
Upto 8-9 units bolus for > 1 starch at dinner and bolus before starting to eat

## 2023-05-01 ENCOUNTER — Ambulatory Visit: Payer: Medicare HMO | Admitting: Adult Health

## 2023-05-02 ENCOUNTER — Encounter: Payer: Self-pay | Admitting: Endocrinology

## 2023-05-20 ENCOUNTER — Telehealth: Payer: Self-pay

## 2023-05-20 DIAGNOSIS — G459 Transient cerebral ischemic attack, unspecified: Secondary | ICD-10-CM

## 2023-05-20 DIAGNOSIS — Z794 Long term (current) use of insulin: Secondary | ICD-10-CM

## 2023-05-20 DIAGNOSIS — I10 Essential (primary) hypertension: Secondary | ICD-10-CM

## 2023-05-20 NOTE — Telephone Encounter (Signed)
-----   Message from Sherrill Raring, [facility] sent at 05/20/2023  8:19 AM EDT ----- Regarding: 2300-Pharmacy Referral Hello,  Patient is eligible to meet with me through the Care Coordination program. PCP has approved.  Could a 2300-Pharmacy Referral be placed for medication management please?  Thank you! Sherrill Raring Clinical Pharmacist [phone]

## 2023-05-21 ENCOUNTER — Ambulatory Visit: Payer: Medicare HMO

## 2023-05-21 ENCOUNTER — Telehealth: Payer: Self-pay

## 2023-05-21 NOTE — Progress Notes (Signed)
Care Management & Coordination Services Pharmacy Team  Reason for Encounter: Appointment Reminder   Contacted patient to confirm in office appointment with Delano Metz, PharmD on 05/28/2023 at 9:00. Unsuccessful outreach. Left voicemail for patient to return call.    Have you seen any other providers since your last visit? **  Any changes in your medications or health?   Any side effects from any medications?   Do you have an symptoms or problems not managed by your medications?   Any concerns about your health right now?   Has your provider asked that you check blood pressure, blood sugar, or follow special diet at home?   Do you get any type of exercise on a regular basis?   Can you think of a goal you would like to reach for your health?   Do you have any problems getting your medications?   Is there anything that you would like to discuss during the appointment?   Please bring medications and supplements to appointment   Chart review:  Recent office visits:  04/23/2023 Shirline Frees NP - Patient was seen for Type 1 diabetes mellitus with unspecified complications and additional concerns. No medication changes.   03/19/2023 Shirline Frees NP - Patient was seen for Routine general medical examination at a health care facility and additional concerns. Started Lisonopril 2.5 mg daily. Changed Isulin Aspart 100 un/ml to up to 50 units daily in insulin pump. Discontinued Insulin Glargine.   01/09/2023 Shirline Frees NP - Patient was seen for Type 1 diabetes mellitus with unspecified complications and additional concerns. Discontinued Lisinopril.   Recent consult visits:  04/30/2023 Reather Littler MD (endo) - Patient was seen for Uncontrolled type 1 diabetes mellitus with hyperglycemia. No medication changes.   04/24/2023 Eligha Bridegroom NP (cardiology) - Patient was seen for essential hypertension and additional concerns. No medication changes.   03/25/2023 Lance Muss (cardiology) - Patient was seen for essential hypertension and additional concerns. No medication changes.   02/25/2023 Reather Littler MD (endo) - Patient was seen for Uncontrolled type 1 diabetes mellitus with hyperglycemia. No medication changes.  02/04/2023 Reather Littler MD (endo) - Patient was seen for Uncontrolled type 1 diabetes mellitus with hyperglycemia. No medication changes.  12/20/2022 Jari Favre PA-C (cards) - Patient was seen for essential hypertension and additional concerns. Started Atorvastatin 80 mg nightly.   12/18/2022 [facility] - Started for Other specified diabetes mellitus with diabetic chronic kidney disease and additional concerns.   12/12/2022  Reather Littler MD (endo) - Patient was seen for Uncontrolled type 1 diabetes mellitus with hyperglycemia and an additional concern. Changed Isulin Aspart 100 un/ml to up to 50 units daily in insulin pump.  Hospital visits:  Patient was seen at [facility] ED on 04/19/2023 (16 hrs) due to hyperglycemia. Discharge date was 04/20/2023.  New?Medications Started at [facility] Discharge:?? None Medication Changes at Hospital Discharge: None Medications Discontinued at Hospital Discharge: None Medications that remain the same after Hospital Discharge:??  -All other medications will remain the same.     Admitted to [facility] on 12/03/2022 due to DKA and additonal concerns. Discharge date was 12/10/2022.  New?Medications Started at [facility] Discharge:?? docusate sodium (COLACE) mouth rinse polyethylene glycol Medication Changes at Hospital Discharge: insulin aspart (novoLOG) NovoLOG FlexPen (insulin aspart) Lantus SoloStar (insulin glargine) Medications Discontinued at Hospital Discharge: atorvastatin 80 MG tablet (LIPITOR) Medications that remain the same after Hospital Discharge:??  -All other medications will remain the same   Patient was  seen at [facility] ED on 11/29/2022 due to aphasia  and additional concerns.  New?Medications Started at [facility] Discharge:?? None Medication Changes at Hospital Discharge: None Medications Discontinued at Hospital Discharge: Glucagon Medications that remain the same after Hospital Discharge:??  -All other medications will remain the same  Care Gaps: AWV - completed 12/04/2021 Last eye exam - 01/28/2022 Last foot exam - 10/05/21 Last BP - 138/82 on 04/30/2023 Last A1C - 6.6 on 04/30/2023 Shingrix - never done  Star Rating Drugs:  Atorvastatin 80 mg - last filled 12/20/2022 90 DS at [facility] Lisinopril 2.5 mg  - last filled 03/19/2023 90 DS at [facility]  Inetta Fermo [facility]  Clinical Pharmacist Assistant [phone]

## 2023-05-25 NOTE — Progress Notes (Unsigned)
Care Management & Coordination Services Pharmacy Note  05/25/2023 Name:  Christopher [name] MRN:  [id number] DOB:  24-Sep-1946  Summary: ***  Recommendations/Changes made from today's visit: ***  Follow up plan: ***   Subjective: Christopher [name] is an 77 y.o. year old male who is a primary patient of Shirline Frees, NP.  The care coordination team was consulted for assistance with disease management and care coordination needs.    {CCMTELEPHONEFACETOFACE:21091510} for initial visit.  Recent office visits: 04/23/2023 Shirline Frees NP - Patient was seen for Type 1 diabetes mellitus with unspecified complications and additional concerns. No medication changes.    03/19/2023 Shirline Frees NP - Patient was seen for Routine general medical examination at a health care facility and additional concerns. Started Lisonopril 2.5 mg daily. Changed Insulin Aspart 100 un/ml to up to 50 units daily in insulin pump. Discontinued Insulin Glargine.    01/09/2023 Shirline Frees NP - Patient was seen for Type 1 diabetes mellitus with unspecified complications and additional concerns. Discontinued Lisinopril.  Recent consult visits: 04/30/2023 Reather Littler MD (endo) - Patient was seen for Uncontrolled type 1 diabetes mellitus with hyperglycemia. No medication changes.    04/24/2023 Eligha Bridegroom NP (cardiology) - Patient was seen for essential hypertension and additional concerns. No medication changes.    03/25/2023 Lance Muss (cardiology) - Patient was seen for essential hypertension and additional concerns. No medication changes.    02/25/2023 Reather Littler MD (endo) - Patient was seen for Uncontrolled type 1 diabetes mellitus with hyperglycemia. No medication changes.   02/04/2023 Reather Littler MD (endo) - Patient was seen for Uncontrolled type 1 diabetes mellitus with hyperglycemia. No medication changes.   12/20/2022 Jari Favre PA-C (cards) - Patient was seen for essential hypertension and  additional concerns. Started Atorvastatin 80 mg nightly.    12/18/2022 [facility] - Started for Other specified diabetes mellitus with diabetic chronic kidney disease and additional concerns.    12/12/2022  Reather Littler MD (endo) - Patient was seen for Uncontrolled type 1 diabetes mellitus with hyperglycemia and an additional concern. Changed Isulin Aspart 100 un/ml to up to 50 units daily in insulin pump.  Hospital visits: 04/19/23 [facility] - For hyperglycemia, LOS 16 hours, no med changes  12/03/22 [facility] - For DKA, LOS 7 days. START Colace, mouth rinse and Miralax. Changed Novolog and Lantus. STOP Lipitor  11/29/22 [facility] -  For aphasia, no med changes   Objective:  Lab Results  Component Value Date   CREATININE 1.67 (H) 04/19/2023   BUN 21 04/19/2023   GFR 47.86 (L) 03/20/2023   GFRNONAA 42 (L) 04/19/2023   GFRAA 53 (L) 06/26/2018   NA 133 (L) 04/19/2023   K 4.6 04/19/2023   CALCIUM 8.8 (L) 04/19/2023   CO2 19 (L) 04/19/2023   GLUCOSE 400 (H) 04/19/2023    Lab Results  Component Value Date/Time   HGBA1C 6.6 (A) 04/30/2023 09:52 AM   HGBA1C 8.9 (H) 12/05/2022 05:06 AM   HGBA1C 10.1 (H) 08/12/2022 12:26 PM   FRUCTOSAMINE 427 (H) 10/04/2022 09:41 AM   FRUCTOSAMINE 479 (H) 09/16/2016 08:09 AM   GFR 47.86 (L) 03/20/2023 08:32 AM   GFR 47.22 (L) 10/04/2022 09:41 AM   MICROALBUR <0.7 05/20/2022 10:12 AM   MICROALBUR <0.7 08/26/2018 10:15 AM    Last diabetic Eye exam:  Lab Results  Component Value Date/Time   HMDIABEYEEXA No Retinopathy 01/28/2022 11:23 AM    Last diabetic Foot exam:  Lab  Results  Component Value Date/Time   HMDIABFOOTEX done 09/13/2009 12:00 AM     Lab Results  Component Value Date   CHOL 214 (H) 03/20/2023   HDL 51.20 03/20/2023   LDLCALC 149 (H) 03/20/2023   LDLDIRECT 135.3 06/17/2013   TRIG 67.0 03/20/2023   CHOLHDL 4 03/20/2023       Latest Ref Rng & Units 04/19/2023   11:48 PM 03/20/2023    8:32 AM  12/10/2022    5:03 AM  Hepatic Function  Total Protein 6.5 - 8.1 g/dL 6.8  6.7    Albumin 3.5 - 5.0 g/dL 3.7  4.0  2.6   AST 15 - 41 U/L 21  14    ALT 0 - 44 U/L 17  9    Alk Phosphatase 38 - 126 U/L 109  91    Total Bilirubin 0.3 - 1.2 mg/dL 0.6  0.8      Lab Results  Component Value Date/Time   TSH 1.25 03/20/2023 08:32 AM   TSH 0.95 12/13/2020 12:12 PM       Latest Ref Rng & Units 04/19/2023   11:48 PM 03/20/2023    8:32 AM 12/10/2022    5:03 AM  CBC  WBC 4.0 - 10.5 K/uL 5.9  4.9  4.9   Hemoglobin 13.0 - 17.0 g/dL 40.9  81.1  91.4   Hematocrit 39.0 - 52.0 % 40.4  41.2  35.3   Platelets 150 - 400 K/uL 230  221.0  297     Lab Results  Component Value Date/Time   VD25OH 38.28 12/13/2020 12:12 PM   VD25OH 38.99 10/08/2019 08:06 AM   VITAMINB12 719 12/13/2020 12:12 PM   VITAMINB12 538 10/08/2019 08:06 AM    Clinical ASCVD: Yes  The ASCVD Risk score (Arnett DK, et al., 2019) failed to calculate for the following reasons:   The patient has a prior MI or stroke diagnosis       04/03/2022    9:11 AM 12/14/2021    7:47 AM 12/13/2020   11:41 AM  Depression screen PHQ 2/9  Decreased Interest 0 0 0  Down, Depressed, Hopeless 0 0 0  PHQ - 2 Score 0 0 0  Altered sleeping 0 0 0  Tired, decreased energy 1 0 0  Change in appetite 1 0 0  Feeling bad or failure about yourself  0 0 0  Trouble concentrating 1 0 0  Moving slowly or fidgety/restless 1 0 0  Suicidal thoughts 0 0 0  PHQ-9 Score 4 0 0  Difficult doing work/chores Not difficult at all Not difficult at all Not difficult at all     Social History   Tobacco Use  Smoking Status Never  Smokeless Tobacco Never   BP Readings from Last 3 Encounters:  04/30/23 138/82  04/24/23 (!) 150/92  04/23/23 122/68   Pulse Readings from Last 3 Encounters:  04/30/23 76  04/24/23 75  04/23/23 80   Wt Readings from Last 3 Encounters:  04/30/23 212 lb 6.4 oz (96.3 kg)  04/24/23 211 lb 9.6 oz (96 kg)  04/23/23 211 lb (95.7  kg)   BMI Readings from Last 3 Encounters:  04/30/23 26.55 kg/m  04/24/23 26.45 kg/m  04/23/23 26.37 kg/m    No Known Allergies  Medications Reviewed Today     Reviewed by Jama Flavors, CMA (Certified Medical Assistant) on 04/30/23 at 0940  Med List Status: <None>   Medication Order Taking? Sig Documenting Provider Last Dose Status Informant  atorvastatin (  LIPITOR) 80 MG tablet [id number]  Take 1 tablet (80 mg total) by mouth at bedtime. Sharlene Dory, PA-C  Active   brimonidine (ALPHAGAN) 0.15 % ophthalmic solution [id number]  Place 1 drop into both eyes 2 (two) times daily. [provider]  Active Spouse/Significant Other, Pharmacy Records  Continuous Blood Gluc Receiver [facility] G7 RECEIVER) DEVI [id number] No Use to check blood sugar  Patient not taking: Reported on 04/30/2023   Reather Littler, MD Not Taking Active Spouse/Significant Other, Pharmacy Records  Continuous Blood Gluc Sensor (DEXCOM G6 SENSOR) MISC [id number] Yes Change every 10 days Reather Littler, MD Taking Active Spouse/Significant Other, Pharmacy Records  Continuous Blood Gluc Sensor (FREESTYLE LIBRE 2 SENSOR) [place] [id number] No 1 Device by Does not apply route every 14 (fourteen) days.  Patient not taking: Reported on 04/30/2023   Philip Aspen, Limmie Patricia, MD Not Taking Active Spouse/Significant Other, Pharmacy Records  Continuous Blood Gluc Transmit (DEXCOM G6 TRANSMITTER) MISC [id number] Yes Use every 3 months Reather Littler, MD Taking Active Spouse/Significant Other, Pharmacy Records  docusate sodium (COLACE) 100 MG capsule [id number]  Take 1 capsule (100 mg total) by mouth 2 (two) times daily.  Patient taking differently: Take 100 mg by mouth as needed.   Berton Mount I, MD  Active   dorzolamide-timolol (COSOPT) 22.3-6.8 MG/ML ophthalmic solution [id number]  Place 1 drop into both eyes 2 (two) times daily.  [provider]  Active Spouse/Significant Other, Pharmacy Records  glucose blood (ONETOUCH  VERIO) test strip [id number] No 1 each by Other route 2 (two) times daily. And lancets 2/day  Patient not taking: Reported on 04/30/2023   Romero Belling, MD Not Taking Active Spouse/Significant Other, Pharmacy Records  insulin aspart (NOVOLOG) 100 UNIT/ML injection [id number] Yes Up to 50 units daily in insulin pump Reather Littler, MD Taking Active   Insulin Disposable Pump (OMNIPOD 5 G6 INTRO, GEN 5,) KIT [id number] Yes Inject into the skin. [provider] Taking Active   Insulin Disposable Pump (OMNIPOD 5 G6 PODS, GEN 5,) MISC [id number] Yes SMARTSIG:SUB-Q Every 3 Days Reather Littler, MD Taking Active   Insulin Pen Needle 32G X 4 MM MISC [id number] Yes Use as instructed to administer insulin 4-5X daily. E10.65 Reather Littler, MD Taking Active Spouse/Significant Other, Pharmacy Records  Insulin Syringe-Needle U-100 (BD VEO INSULIN SYRINGE U/F) 31G X 15/64" 1 ML MISC [id number] Yes USE  SYRINGE TWICE DAILY Reather Littler, MD Taking Active Spouse/Significant Other, Pharmacy Records  Lancets [facility] ULTRASOFT) lancets [id number]  Use to check blood sugar 3 times a day Reather Littler, MD  Active   lisinopril (ZESTRIL) 2.5 MG tablet [id number]  Take 1 tablet (2.5 mg total) by mouth daily. Nafziger, Kandee Keen, NP  Active   Multiple Vitamins-Minerals (MULTIVITAMIN ADULTS 50+) TABS [id number]  Take 1 tablet by mouth daily. [provider]  Active Spouse/Significant Other, Pharmacy Records  polyethylene glycol (MIRALAX / GLYCOLAX) 17 g packet [id number]  Take 17 g by mouth daily.  Patient taking differently: Take 17 g by mouth as needed.   Barnetta Chapel, MD  Active             SDOH:  (Social Determinants of Health) assessments and interventions performed: Yes SDOH Interventions    Flowsheet Row Telephone from 04/24/2023 in [facility] Care Coordination Office Visit from 04/03/2022 in [facility] [place] Chronic Care Management from 05/09/2020 in [facility] [place]  SDOH Interventions     Food Insecurity Interventions Intervention Not Indicated -- --  Transportation Interventions Intervention Not Indicated -- Intervention Not Indicated  Depression Interventions/Treatment  -- ZOX0-9 Score <4 Follow-up Not Indicated --  Physical Activity Interventions -- -- Intervention Not Indicated       Medication Assistance: {MEDASSISTANCEINFO:25044}  Medication Access: Name and location of current pharmacy:  [facility] [address] ([address]), [place] - [address] [place] [address] Phone: [phone] Fax: [phone]  [facility] - [facility] [address] [place] [address] Phone: [phone] Fax: [phone]  [facility] [place] (New Address) - [place], [place] - [address] Previously: [address], [address] [facility] [address] [place] [address] Phone: [phone] Fax: [phone]  Within the past 30 days, how often has patient missed a dose of medication? *** Is a pillbox or other method used to improve adherence? {YES/NO:21197} Factors that may affect medication adherence? {CHL DESC; BARRIERS:21522} Are meds synced by current pharmacy? {YES/NO:21197} Are meds delivered by current pharmacy? {YES/NO:21197} Does patient experience delays in picking up medications due to transportation concerns? {YES/NO:21197}  Compliance/Adherence/Medication fill history: Care Gaps: AWV - completed 12/04/2021 Last eye exam - 01/28/2022 Last foot exam - 10/05/21 Last BP - 138/82 on 04/30/2023 Last A1C - 6.6 on 04/30/2023 Shingrix - never done  Star-Rating Drugs: Atorvastatin 80mg  - LF 12/20/22 90DS Lisinopril 2.5mg  - LF 03/19/23 90 DS   Assessment/Plan Hypertension (BP goal <140/90) -{US controlled/uncontrolled:25276} -Current treatment: Lisinopril 2.5mg  1  qd -Medications previously tried: HCTZ  -Current home readings: *** -Current dietary habits: *** -Current exercise habits: *** -{ACTIONS;DENIES/REPORTS:21021675::"Denies"} hypotensive/hypertensive symptoms -Educated on {CCM BP Counseling:25124} -Counseled to monitor BP at home ***, document, and provide log at future appointments -{CCMPHARMDINTERVENTION:25122}  Hyperlipidemia: (LDL goal < 70) -Uncontrolled -Current treatment: Atorvastatin 80mg  1 qd -Medications previously tried: Simvastatin  -Current dietary patterns: *** -Current exercise habits: *** -Educated on {CCM HLD Counseling:25126} -{CCMPHARMDINTERVENTION:25122}  Diabetes (A1c goal <7%) -Controlled -Current medications: Novolog up to 50 units daily in Omnipod pump -Medications previously tried:  Trulicity, Victoza -Current home glucose readings fasting glucose: *** post prandial glucose: *** -{ACTIONS;DENIES/REPORTS:21021675::"Denies"} hypoglycemic/hyperglycemic symptoms -Current meal patterns:  breakfast: ***  lunch: ***  dinner: *** snacks: *** drinks: *** -Current exercise: *** -Educated on {CCM DM COUNSELING:25123} -Counseled to check feet daily and get yearly eye exams -{CCMPHARMDINTERVENTION:25122}  Glaucoma (Goal: Ocular eye pressure WNL) -Not assessed today -Current treatment  Brimonidine 0.15% 1 drop into both eyes BID Cosopt 1 drop into both eyes BID  OTC  -Current treatment  MVI Miralax Colace 100mg     Sherrill Raring Clinical Pharmacist [phone]

## 2023-05-28 ENCOUNTER — Ambulatory Visit: Payer: Medicare HMO

## 2023-05-29 ENCOUNTER — Telehealth: Payer: Self-pay | Admitting: Dietician

## 2023-05-29 ENCOUNTER — Telehealth: Payer: Self-pay

## 2023-05-29 NOTE — Telephone Encounter (Signed)
Returned patient's wife's call.  She requested an appointment for her and her husband to meet with Bonita Quin. Noted that he had a POD come loose which caused his blood glucose to be higher.  They were told by staff to replace the POD and give correction insulin as needed.  She was not available.  Left a message for her to call [facility] at [phone] to arrange an appointment.  Oran Rein, RD, LDN, CDCES

## 2023-05-29 NOTE — Telephone Encounter (Signed)
Patient's wife called in for husband regarding a rise in his blood sugar. She stated that the Pod had come loose last night but there hsa been no other changes regarding their routine. Please advise

## 2023-05-29 NOTE — Telephone Encounter (Signed)
Spoke with patient. They will be following up with the diabetes coordinator and will call back in to make an appointment with Dr. Annye Rusk

## 2023-06-04 ENCOUNTER — Encounter: Payer: Medicare HMO | Attending: Endocrinology | Admitting: Nutrition

## 2023-06-04 DIAGNOSIS — N1831 Chronic kidney disease, stage 3a: Secondary | ICD-10-CM | POA: Insufficient documentation

## 2023-06-04 DIAGNOSIS — E1122 Type 2 diabetes mellitus with diabetic chronic kidney disease: Secondary | ICD-10-CM | POA: Insufficient documentation

## 2023-06-05 NOTE — Progress Notes (Signed)
Wife reports that husband got occlusion alarm and did not know what to do.  She kept doing correction doses and blood sugar kept rising.  They went to the ER and was told to change his pod.  Reviewed high blood protocol and she was given a handout on the steps for this.  We reviewed this and she reported good understanding.  They appear to be giving boluses before all meals and doing correction doses whenever blood sugars go over 225.  Husband continues to snack without telling his wife, causing blood sugars to go up.  Due to his memory issues, this will likely continue.  She had no final questions.

## 2023-06-05 NOTE — Patient Instructions (Signed)
When blood sugar goes high, give one correction dose, wait 2 hours, if blood sugar reading goes higher, replace pod with a new one.

## 2023-06-06 ENCOUNTER — Encounter: Payer: Self-pay | Admitting: Endocrinology

## 2023-06-06 ENCOUNTER — Ambulatory Visit: Payer: Medicare HMO | Admitting: Endocrinology

## 2023-06-06 VITALS — BP 126/82 | HR 68 | Ht 75.0 in | Wt 210.0 lb

## 2023-06-06 DIAGNOSIS — E1065 Type 1 diabetes mellitus with hyperglycemia: Secondary | ICD-10-CM

## 2023-06-06 NOTE — Progress Notes (Signed)
Patient ID: Christopher [name], male   DOB: [birth year], 77 y.o.   MRN: [id number]    Chief complaint : Follow up of Type 1 Diabetes  History of Present Illness:          Date of diagnosis: 2010  Prior history:  Previous A1c range has been 8.4-11.6   Therapy: insulin since 2013.   DKA: Twice (2023) Severe hypoglycemia: last episode was in 11/2022.  Non-insulin hypoglycemic drugs: Previously had been on Trulicity and Victoza which were stopped Previous insulins tried include Levemir and NPH  INSULIN regimen, previously: Lantus 38--20 units bid Novolog FlexPen before meals: 10 units before meals  OMNIPOD 5 pump settings:  BASAL rate: 1.15, I/C ratio 1: 1 with mealtime doses 12 units breakfast and 6 units before lunch and dinner  ISF: 40, timing 4 hours, target: 120 with correction over 130.   Recent history:     A1c is last 6.6, was 8.9 previously  He is not having any difficulties with using his pump and the sensor and most of the management is done by his wife  Overall his blood sugar control is somewhat better than the last time when time in range was just below 70% He is usually taking fixed boluses at meals and not able to understand how to adjust them based on what he is eating Postprandial blood sugars after breakfast are quite variable even though he thinks eating the same amount of oatmeal daily Overall his postprandial readings are on an average close to 180 and in the highest of the day With his target of 130 at night for safety his blood sugars are still generally well-controlled and quite stable Blood sugar variability may be again related to delaying his boluses till after the blood sugar has gone up, occasionally missing boluses and sometimes bolusing and not eating  His blood sugar was low when he bolused and did not eat when he came to the diabetes educator 2 days ago Otherwise no hypoglycemia Weight is about the same   CONTINUOUS GLUCOSE MONITORING  RECORD INTERPRETATION    Dates of Recording: Last 2 weeks  Sensor description: Dexcom G6  Results statistics:   CGM use % of time 89  Average and SD 161  Time in range    74    %  % Time Above 180 22  % Time above 250 4  % Time Below target 0     Glycemic patterns summary: On average blood sugars are excellent overnight with blood sugars during the day averaging mostly in the upper end of the target range with some variability especially in the late afternoons and evenings.  No hypoglycemia  Hyperglycemic episodes are seen sporadically after breakfast and later in the day after about 3 PM through 9 PM and occasionally for a few hours at a time  Hypoglycemic episodes are minimal with only 1 episode at 10 AM  Overnight periods: These are excellent with very stable readings at least after 2 AM through 8 AM in the low 100 range  Postprandial periods:   After breakfast:   Blood sugars are on an average not rising excessively but on occasion well over 200 After lunch:   He may not always have a lunch but occasionally has higher blood sugars early afternoon After dinner: Blood sugars are not consistently high and occasionally relatively even compared to Premeal readings  Previous data:    CGM use % of time   2-week average/GV 179/45  Time in range 69     % was 58  % Time Above 180 16+15  % Time above 250   % Time Below 70 0     Symptoms of hypoglycemia: Weakness, shakiness        Self-care: The diet that the patient has been following is: Relatively low fat and carbohydrate May have sugar-free cookies at bedtime Breakfast may be oatmeal usually         Exercise: No consistent activity.          Most recent dietitian/nurse educator visit :    04/2022      Wt Readings from Last 3 Encounters:  06/06/23 210 lb (95.3 kg)  04/30/23 212 lb 6.4 oz (96.3 kg)  04/24/23 211 lb 9.6 oz (96 kg)    Diabetes labs:  Lab Results  Component Value Date   HGBA1C 6.6 (A) 04/30/2023    HGBA1C 8.9 (H) 12/05/2022   HGBA1C 10.1 (H) 08/12/2022   Lab Results  Component Value Date   MICROALBUR <0.7 05/20/2022   LDLCALC 149 (H) 03/20/2023   CREATININE 1.67 (H) 04/19/2023    No visits with results within 1 Week(s) from this visit.  Latest known visit with results is:  Office Visit on 04/30/2023  Component Date Value Ref Range Status   Hemoglobin A1C 04/30/2023 6.6 (A)  4.0 - 5.6 % Final    Allergies as of 06/06/2023   No Known Allergies      Medication List        Accurate as of June 06, 2023  1:43 PM. If you have any questions, ask your nurse or doctor.          atorvastatin 80 MG tablet Commonly known as: LIPITOR Take 1 tablet (80 mg total) by mouth at bedtime.   BD Veo Insulin Syringe U/F 31G X 15/64" 1 ML Misc Generic drug: Insulin Syringe-Needle U-100 USE  SYRINGE TWICE DAILY   brimonidine 0.15 % ophthalmic solution Commonly known as: ALPHAGAN Place 1 drop into both eyes 2 (two) times daily.   Dexcom G6 Transmitter Misc Use every 3 months   Dexcom G7 Receiver Devi Use to check blood sugar   docusate sodium 100 MG capsule Commonly known as: COLACE Take 1 capsule (100 mg total) by mouth 2 (two) times daily. What changed:  when to take this reasons to take this   dorzolamide-timolol 2-0.5 % ophthalmic solution Commonly known as: COSOPT Place 1 drop into both eyes 2 (two) times daily.   FreeStyle Libre 2 Sensor Misc 1 Device by Does not apply route every 14 (fourteen) days.   Dexcom G6 Sensor Misc Change every 10 days   insulin aspart 100 UNIT/ML injection Commonly known as: novoLOG Up to 50 units daily in insulin pump   Insulin Pen Needle 32G X 4 MM Misc Use as instructed to administer insulin 4-5X daily. E10.65   lisinopril 2.5 MG tablet Commonly known as: Zestril Take 1 tablet (2.5 mg total) by mouth daily.   Multivitamin Adults 50+ Tabs Take 1 tablet by mouth daily.   Omnipod 5 G6 Intro (Gen 5) Kit Inject into the  skin.   Omnipod 5 G6 Pods (Gen 5) Misc SMARTSIG:SUB-Q Every 3 Days   onetouch ultrasoft lancets Use to check blood sugar 3 times a day   OneTouch Verio test strip Generic drug: glucose blood 1 each by Other route 2 (two) times daily. And lancets 2/day   polyethylene glycol 17 g packet Commonly known as: MIRALAX /  GLYCOLAX Take 17 g by mouth daily. What changed:  when to take this reasons to take this        Allergies: No Known Allergies  Past Medical History:  Diagnosis Date   Diabetes mellitus type II    GERD (gastroesophageal reflux disease)    Glaucoma    Hyperlipidemia    Hypertension     Past Surgical History:  Procedure Laterality Date   COLONOSCOPY     HERNIA REPAIR     PATELLA FRACTURE SURGERY  1993    Family History  Problem Relation Age of Onset   Alcohol abuse Father    Diabetes Sister    Diabetes Brother    Diabetes Brother    Colon cancer Neg Hx    Esophageal cancer Neg Hx    Stomach cancer Neg Hx    Rectal cancer Neg Hx     Social History:  reports that he has never smoked. He has never used smokeless tobacco. He reports that he does not drink alcohol and does not use drugs.    Review of Systems:   Blood pressure: Generally controlled with only 2.5 mg lisinopril  BP Readings from Last 3 Encounters:  06/06/23 126/82  05/28/23 138/66  04/30/23 138/82     Lipids: He has been treated with 80 mg Lipitor prescribed by other physicians  Lab Results  Component Value Date   CHOL 214 (H) 03/20/2023   HDL 51.20 03/20/2023   LDLCALC 149 (H) 03/20/2023   LDLDIRECT 135.3 06/17/2013   TRIG 67.0 03/20/2023   CHOLHDL 4 03/20/2023     Diabetes complications: None, no microalbuminuria Last eye exam 2/23 Last foot exam 10/22    Physical Examination:  BP 126/82 (BP Location: Left Arm, Patient Position: Sitting, Cuff Size: Small)   Pulse 68   Ht 6\' 3"  (1.905 m)   Wt 210 lb (95.3 kg)   SpO2 97%   BMI 26.25 kg/m          ASSESSMENT/PLAN   Diabetes type 1 with persistently poor control   A1c previously over 8% and last 6.6  He is on the OmniPod 5 insulin pump for several months now  Has been able to be in the automated mode 100 % of the time recently  He is doing fairly well and is able to manage his pump with the help of his wife He is still not able to bolus optimally with sometimes the timing of bolus not being right Also with a high carbohydrate meal in the morning blood sugars may sometimes go up significantly  With relatively low carbohydrate meals blood sugars may be only about 120 postprandially in the evening  His day-to-day blood sugar patterns and management especially boluses was discussed in detail Minimal hypoglycemia present  His settings will be continued unchanged Discussed adjustment of suppertime boluses between 4 to 7 units based on his carbohydrate content and meal size Also if he can add more protein such as walnuts to breakfast will help his hypoglycemia postprandially    There are no Patient Instructions on file for this visit.             Total visit time for evaluation and management and counseling = 30 minutes  Keileigh Vahey 06/06/2023, 1:44 PM 0

## 2023-06-12 ENCOUNTER — Encounter: Payer: Self-pay | Admitting: Endocrinology

## 2023-06-18 DIAGNOSIS — E1065 Type 1 diabetes mellitus with hyperglycemia: Secondary | ICD-10-CM | POA: Diagnosis not present

## 2023-08-06 ENCOUNTER — Ambulatory Visit: Payer: Medicare HMO | Admitting: Endocrinology

## 2023-08-06 ENCOUNTER — Encounter: Payer: Self-pay | Admitting: Endocrinology

## 2023-08-06 VITALS — BP 120/82 | HR 74 | Ht 75.0 in | Wt 215.0 lb

## 2023-08-06 DIAGNOSIS — E109 Type 1 diabetes mellitus without complications: Secondary | ICD-10-CM

## 2023-08-06 LAB — POCT GLYCOSYLATED HEMOGLOBIN (HGB A1C): Hemoglobin A1C: 6.7 % — AB (ref 4.0–5.6)

## 2023-08-06 LAB — MICROALBUMIN / CREATININE URINE RATIO
Creatinine,U: 221.1 mg/dL
Microalb Creat Ratio: 0.3 mg/g (ref 0.0–30.0)
Microalb, Ur: 0.7 mg/dL (ref 0.0–1.9)

## 2023-08-06 NOTE — Progress Notes (Signed)
Outpatient Endocrinology Note Christopher Kamaryn Grimley, MD  08/06/23  Patient's Name: Christopher [name]    DOB: November 30, 1946    MRN: [id number]                                                    REASON OF VISIT: Follow up of for type 1 diabetes mellitus  PCP: Shirline Frees, NP  HISTORY OF PRESENT ILLNESS:   Christopher [name] is a 77 y.o. old male with past medical history listed below, is here for follow up of type 2 diabetes mellitus.   Pertinent Diabetes History: Patient was diagnosed with type 1 diabetes mellitus in 2010, insulin therapy was started in 2013.  He was previously on Trulicity and Victoza as well which were stopped later.  He had tried Levemir and NPH insulin in the past.  History of DKA or diabetes related hospitalizations: yes, at least 2 times.  Previous diabetes education: Yes   Chronic Diabetes Complications : Retinopathy: no. Last ophthalmology exam was done on annually. Nephropathy: no, on lisinopril. Peripheral neuropathy: no Coronary artery disease: no Stroke: no  Relevant comorbidities and cardiovascular risk factors: Obesity: no Body mass index is 26.87 kg/m.  Hypertension: yes Hyperlipidemia. yes  Current / Home Diabetic regimen includes:  OmniPod 5 with Dexcom G6  Insulin Pump setting:  Basal MN- 1.15u/hour Bolus  CHO Ratio (1unit:CHO) MN- 1:1  He uses fixed carb count 12 for breakfast, 6 for lunch and 6 for supper.  Correction/Sensitivity: MN- 1:35  Target: 130 and 120  Active insulin time: 4 hours  Prior diabetic medications: Trulicity, Victoza, Levemir, NPH.  CONTINUOUS GLUCOSE MONITORING SYSTEM (CGMS) / INSULIN PUMP INTERPRETATION:                         OmniPod 5 Pump & Sensor Download (Reviewed and summarized below.)  Dates: August 10 to August 06, 2019 for 14 days Average BG:  161,58-high   Glucose Management Indicator: 7.2% Sensor Average: 162  CGM/Sensor usage:87.8%  Glycemic Trends:  <54: 0% 54-70: 0% 71-180: 76% 181-250:  18% 251-400: 6%   Average daily carbs entered: 22.5 Average total daily insulin:  50 units, Basal: 53%, Bolus: 47%    Trends: Mostly having acceptable blood sugar.  Overnight blood sugar acceptable. Rare hyperglycemia related with meals especially with supper, probably related with eating cake or ice cream and some time with late bolus.  Frequent suspicion of basal rate especially at night and sometimes in between the meals with 1 episode of hypoglycemia at night on August 12.  No other significant hypoglycemia.  Hypoglycemia: Patient has significant hypoglycemic episodes. Patient has hypoglycemia awareness.    Factors modifying glucose control: 1.  Diabetic diet assessment: 3 meals a day breakfast (oatmeal usually), lunch and dinner however sometimes he eats cake or ice cream.   2.  Staying active or exercising: Playing golf and active at home.  3.  Medication compliance: compliant all of the time.  Interval history 08/06/23 Pump download data and CGM data as reviewed above.  Hemoglobin A1c 6.7%.  No complaints of vision or numbness or tingling of the feet.  He does fixed carb count 12-6-6 without the adjustment with based on the meal.  No other complaints today.  REVIEW OF SYSTEMS As per history of present illness.   PAST  MEDICAL HISTORY: Past Medical History:  Diagnosis Date   Diabetes mellitus type II    GERD (gastroesophageal reflux disease)    Glaucoma    Hyperlipidemia    Hypertension     PAST SURGICAL HISTORY: Past Surgical History:  Procedure Laterality Date   COLONOSCOPY     HERNIA REPAIR     PATELLA FRACTURE SURGERY  1993    ALLERGIES: No Known Allergies  FAMILY HISTORY:  Family History  Problem Relation Age of Onset   Alcohol abuse Father    Diabetes Sister    Diabetes Brother    Diabetes Brother    Colon cancer Neg Hx    Esophageal cancer Neg Hx    Stomach cancer Neg Hx    Rectal cancer Neg Hx     SOCIAL HISTORY: Social History   Socioeconomic  History   Marital status: Married    Spouse name: Not on file   Number of children: Not on file   Years of education: Not on file   Highest education level: Not on file  Occupational History   Not on file  Tobacco Use   Smoking status: Never   Smokeless tobacco: Never  Vaping Use   Vaping status: Never Used  Substance and Sexual Activity   Alcohol use: No   Drug use: No   Sexual activity: Not on file  Other Topics Concern   Not on file  Social History Narrative   He enjoys playing golf    Social Determinants of Health   Financial Resource Strain: Not on file  Food Insecurity: No Food Insecurity (05/28/2023)   Hunger Vital Sign    Worried About Running Out of Food in the Last Year: Never true    Ran Out of Food in the Last Year: Never true  Transportation Needs: No Transportation Needs (04/24/2023)   PRAPARE - Administrator, Civil Service (Medical): No    Lack of Transportation (Non-Medical): No  Physical Activity: Sufficiently Active (05/10/2020)   Exercise Vital Sign    Days of Exercise per Week: 5 days    Minutes of Exercise per Session: 60 min  Stress: Not on file  Social Connections: Not on file    MEDICATIONS:  Current Outpatient Medications  Medication Sig Dispense Refill   atorvastatin (LIPITOR) 80 MG tablet Take 1 tablet (80 mg total) by mouth at bedtime. 90 tablet 3   brimonidine (ALPHAGAN) 0.15 % ophthalmic solution Place 1 drop into both eyes 2 (two) times daily.     Continuous Blood Gluc Receiver (DEXCOM G7 RECEIVER) DEVI Use to check blood sugar 1 each 0   Continuous Blood Gluc Sensor (DEXCOM G6 SENSOR) MISC Change every 10 days 9 each 2   Continuous Blood Gluc Transmit (DEXCOM G6 TRANSMITTER) MISC Use every 3 months 1 each 2   docusate sodium (COLACE) 100 MG capsule Take 1 capsule (100 mg total) by mouth 2 (two) times daily. (Patient taking differently: Take 100 mg by mouth as needed.) 10 capsule 0   dorzolamide-timolol (COSOPT) 22.3-6.8 MG/ML  ophthalmic solution Place 1 drop into both eyes 2 (two) times daily.      glucose blood (ONETOUCH VERIO) test strip 1 each by Other route 2 (two) times daily. And lancets 2/day 200 each 3   insulin aspart (NOVOLOG) 100 UNIT/ML injection Up to 50 units daily in insulin pump 20 mL 2   Insulin Disposable Pump (OMNIPOD 5 G6 INTRO, GEN 5,) KIT Inject into the skin.  Insulin Disposable Pump (OMNIPOD 5 G6 PODS, GEN 5,) MISC SMARTSIG:SUB-Q Every 3 Days 10 each 5   Insulin Pen Needle 32G X 4 MM MISC Use as instructed to administer insulin 4-5X daily. E10.65 400 each 1   Insulin Syringe-Needle U-100 (BD VEO INSULIN SYRINGE U/F) 31G X 15/64" 1 ML MISC USE  SYRINGE TWICE DAILY 200 each 5   Lancets (ONETOUCH ULTRASOFT) lancets Use to check blood sugar 3 times a day 100 each 12   lisinopril (ZESTRIL) 2.5 MG tablet Take 1 tablet (2.5 mg total) by mouth daily. 90 tablet 3   Multiple Vitamins-Minerals (MULTIVITAMIN ADULTS 50+) TABS Take 1 tablet by mouth daily.     polyethylene glycol (MIRALAX / GLYCOLAX) 17 g packet Take 17 g by mouth daily. (Patient taking differently: Take 17 g by mouth as needed.) 14 each 0   No current facility-administered medications for this visit.    PHYSICAL EXAM: Vitals:   08/06/23 0800  BP: 120/82  Pulse: 74  SpO2: 97%  Weight: 215 lb (97.5 kg)  Height: 6\' 3"  (1.905 m)   Body mass index is 26.87 kg/m.  Wt Readings from Last 3 Encounters:  08/06/23 215 lb (97.5 kg)  06/06/23 210 lb (95.3 kg)  04/30/23 212 lb 6.4 oz (96.3 kg)    General: Well developed, well nourished male in no apparent distress.  HEENT: AT/[place], no external lesions.  Eyes: Conjunctiva clear and no icterus. Neck: Neck supple  Lungs: Respirations not labored Neurologic: Alert, oriented, normal speech Extremities / Skin: Dry. No sores or rashes noted. No acanthosis nigricans Psychiatric: Does not appear depressed or anxious  Diabetic Foot Exam - Simple   No data filed     LABS Reviewed Lab  Results  Component Value Date   HGBA1C 6.7 (A) 08/06/2023   HGBA1C 6.6 (A) 04/30/2023   HGBA1C 8.9 (H) 12/05/2022   Lab Results  Component Value Date   FRUCTOSAMINE 427 (H) 10/04/2022   FRUCTOSAMINE 479 (H) 09/16/2016   Lab Results  Component Value Date   CHOL 214 (H) 03/20/2023   HDL 51.20 03/20/2023   LDLCALC 149 (H) 03/20/2023   LDLDIRECT 135.3 06/17/2013   TRIG 67.0 03/20/2023   CHOLHDL 4 03/20/2023   Lab Results  Component Value Date   MICRALBCREAT 0.9 05/20/2022   MICRALBCREAT 0.6 08/26/2018   Lab Results  Component Value Date   CREATININE 1.67 (H) 04/19/2023   Lab Results  Component Value Date   GFR 47.86 (L) 03/20/2023    ASSESSMENT / PLAN  1. Controlled diabetes mellitus type 1 without complications (HCC)     Diabetes Mellitus type 1, complicated by no known complications. - Diabetic status / severity: Controlled.  Lab Results  Component Value Date   HGBA1C 6.7 (A) 08/06/2023    - Hemoglobin A1c goal <7%   - Medications:  Insulin pump setting changed as follows: OmniPod 5 insulin Pump setting:   Insulin Pump setting:  Basal MN- 1.15u/hour, changed to 1.05 Bolus  CHO Ratio (1unit:CHO) MN- 1:1  He uses fixed carb count 12 for breakfast, 6 for lunch and 6 for supper. :  Advised thyroid soft tract 2 points on carb count with meals based on meal size and carbohydrate content.  Correction/Sensitivity: MN- 1:35  Target: 130 and 120  Active insulin time: 4 hours  - Home glucose testing: continue CGM and check blood glucose as needed.  - Discussed/ Gave Hypoglycemia treatment plan.  # Consult : not required at this time.   #  Annual urine for microalbuminuria/ creatinine ratio, no microalbuminuria currently, continue ACE/ARB /lisinopril.  Will check today. Last  Lab Results  Component Value Date   MICRALBCREAT 0.9 05/20/2022    # Foot check nightly.  # Annual dilated diabetic eye exams.   - Diet: Eat reasonable portion sizes to  promote a healthy weight - Life style / activity / exercise: Discussed.  2. Blood pressure  -  BP Readings from Last 1 Encounters:  08/06/23 120/82    - Control is in target.  - No change in current plans.  3. Lipid status / Hyperlipidemia - Last  Lab Results  Component Value Date   LDLCALC 149 (H) 03/20/2023   - Continue atorvastatin 80 mg daily.  Managed by primary care provider.  Christopher [name] was seen today for follow-up.  Diagnoses and all orders for this visit:  Controlled diabetes mellitus type 1 without complications (HCC) -     POCT glycosylated hemoglobin (Hb A1C) -     Microalbumin / creatinine urine ratio; Future -     Microalbumin / creatinine urine ratio    DISPOSITION Follow up in clinic in 3 months suggested.   All questions answered and patient verbalized understanding of the plan.  Christopher Christopher Mayfield, MD [facility] Endocrinology [facility] [address] [place], [address] [place], [place] [address] Phone # [phone]  At least part of this note was generated using voice recognition software. Inadvertent word errors may have occurred, which were not recognized during the proofreading process.

## 2023-08-07 ENCOUNTER — Encounter: Payer: Self-pay | Admitting: Endocrinology

## 2023-08-13 ENCOUNTER — Ambulatory Visit (INDEPENDENT_AMBULATORY_CARE_PROVIDER_SITE_OTHER): Payer: Medicare HMO | Admitting: Adult Health

## 2023-08-13 ENCOUNTER — Encounter: Payer: Self-pay | Admitting: Adult Health

## 2023-08-13 VITALS — BP 140/88 | HR 75 | Temp 98.3°F | Ht 75.0 in | Wt 212.0 lb

## 2023-08-13 DIAGNOSIS — R52 Pain, unspecified: Secondary | ICD-10-CM

## 2023-08-13 DIAGNOSIS — B349 Viral infection, unspecified: Secondary | ICD-10-CM

## 2023-08-13 LAB — POC COVID19 BINAXNOW: SARS Coronavirus 2 Ag: NEGATIVE — AB

## 2023-08-13 NOTE — Progress Notes (Signed)
Subjective:    Patient ID: FISCHER BOASE, male    DOB: 1946-07-30, 77 y.o.   MRN: [id number]  Neck Pain   Shoulder Pain     77 year old male who  has a past medical history of Diabetes mellitus type II, GERD (gastroesophageal reflux disease), Glaucoma, Hyperlipidemia, and Hypertension.  He presents to the office today for an acute visit. He reports that started about 3-4 days ago he started to develops neck pain, bilateral shoulder pain and upper back pain. He also felt fatigued and generally not feeling well. Today when he woke up he felt " a hole lot better. He did not have any fevers, chills, abd pain, n/v/d.    Review of Systems  Musculoskeletal:  Positive for neck pain.   See HPI   Past Medical History:  Diagnosis Date   Diabetes mellitus type II    GERD (gastroesophageal reflux disease)    Glaucoma    Hyperlipidemia    Hypertension     Social History   Socioeconomic History   Marital status: Married    Spouse name: Not on file   Number of children: Not on file   Years of education: Not on file   Highest education level: Not on file  Occupational History   Not on file  Tobacco Use   Smoking status: Never   Smokeless tobacco: Never  Vaping Use   Vaping status: Never Used  Substance and Sexual Activity   Alcohol use: No   Drug use: No   Sexual activity: Not on file  Other Topics Concern   Not on file  Social History Narrative   He enjoys playing golf    Social Determinants of Health   Financial Resource Strain: Not on file  Food Insecurity: No Food Insecurity (05/28/2023)   Hunger Vital Sign    Worried About Running Out of Food in the Last Year: Never true    Ran Out of Food in the Last Year: Never true  Transportation Needs: No Transportation Needs (04/24/2023)   PRAPARE - Administrator, Civil Service (Medical): No    Lack of Transportation (Non-Medical): No  Physical Activity: Sufficiently Active (05/10/2020)   Exercise Vital Sign     Days of Exercise per Week: 5 days    Minutes of Exercise per Session: 60 min  Stress: Not on file  Social Connections: Not on file  Intimate Partner Violence: Not At Risk (10/06/2022)   Humiliation, Afraid, Rape, and Kick questionnaire    Fear of Current or Ex-Partner: No    Emotionally Abused: No    Physically Abused: No    Sexually Abused: No    Past Surgical History:  Procedure Laterality Date   COLONOSCOPY     HERNIA REPAIR     PATELLA FRACTURE SURGERY  1993    Family History  Problem Relation Age of Onset   Alcohol abuse Father    Diabetes Sister    Diabetes Brother    Diabetes Brother    Colon cancer Neg Hx    Esophageal cancer Neg Hx    Stomach cancer Neg Hx    Rectal cancer Neg Hx     No Known Allergies  Current Outpatient Medications on File Prior to Visit  Medication Sig Dispense Refill   atorvastatin (LIPITOR) 80 MG tablet Take 1 tablet (80 mg total) by mouth at bedtime. 90 tablet 3   brimonidine (ALPHAGAN) 0.15 % ophthalmic solution Place 1 drop into both eyes 2 (  two) times daily.     Continuous Blood Gluc Receiver (DEXCOM G7 RECEIVER) DEVI Use to check blood sugar 1 each 0   Continuous Blood Gluc Sensor (DEXCOM G6 SENSOR) MISC Change every 10 days 9 each 2   Continuous Blood Gluc Transmit (DEXCOM G6 TRANSMITTER) MISC Use every 3 months 1 each 2   docusate sodium (COLACE) 100 MG capsule Take 1 capsule (100 mg total) by mouth 2 (two) times daily. (Patient taking differently: Take 100 mg by mouth as needed.) 10 capsule 0   dorzolamide-timolol (COSOPT) 22.3-6.8 MG/ML ophthalmic solution Place 1 drop into both eyes 2 (two) times daily.      insulin aspart (NOVOLOG) 100 UNIT/ML injection Up to 50 units daily in insulin pump 20 mL 2   Insulin Disposable Pump (OMNIPOD 5 G6 INTRO, GEN 5,) KIT Inject into the skin.     Insulin Disposable Pump (OMNIPOD 5 G6 PODS, GEN 5,) MISC SMARTSIG:SUB-Q Every 3 Days 10 each 5   Insulin Pen Needle 32G X 4 MM MISC Use as instructed to  administer insulin 4-5X daily. E10.65 400 each 1   lisinopril (ZESTRIL) 2.5 MG tablet Take 1 tablet (2.5 mg total) by mouth daily. 90 tablet 3   Multiple Vitamins-Minerals (MULTIVITAMIN ADULTS 50+) TABS Take 1 tablet by mouth daily.     polyethylene glycol (MIRALAX / GLYCOLAX) 17 g packet Take 17 g by mouth daily. (Patient taking differently: Take 17 g by mouth as needed.) 14 each 0   No current facility-administered medications on file prior to visit.    BP (!) 160/100   Pulse 75   Temp 98.3 F (36.8 C) (Oral)   Ht 6\' 3"  (1.905 m)   Wt 212 lb (96.2 kg)   SpO2 97%   BMI 26.50 kg/m       Objective:   Physical Exam Vitals and nursing note reviewed.  Constitutional:      Appearance: Normal appearance.  HENT:     Mouth/Throat:     Mouth: Mucous membranes are moist.     Pharynx: Oropharynx is clear.  Cardiovascular:     Rate and Rhythm: Normal rate and regular rhythm.     Pulses: Normal pulses.     Heart sounds: Normal heart sounds.  Pulmonary:     Effort: Pulmonary effort is normal.     Breath sounds: Normal breath sounds.  Musculoskeletal:        General: Normal range of motion.  Lymphadenopathy:     Head:     Right side of head: No submental, submandibular, tonsillar, preauricular, posterior auricular or occipital adenopathy.     Left side of head: No submental, submandibular, tonsillar, preauricular, posterior auricular or occipital adenopathy.  Skin:    General: Skin is warm and dry.     Capillary Refill: Capillary refill takes less than 2 seconds.  Neurological:     General: No focal deficit present.     Mental Status: He is alert and oriented to person, place, and time.  Psychiatric:        Mood and Affect: Mood normal.        Behavior: Behavior normal.        Thought Content: Thought content normal.       Assessment & Plan:  1. Body aches  - POC COVID-19- negative   2. Viral illness - Symptoms improving. Likely viral illness.  - Advised rest and  hydration  - Follow up if symptoms revert or get worse  Shirline Frees, NP  Time spent with patient today was 31 minutes which consisted of chart review, discussing viral illness, listening work up, treatment answering questions and documentation.

## 2023-08-15 ENCOUNTER — Telehealth: Payer: Self-pay | Admitting: Endocrinology

## 2023-08-15 MED ORDER — INSULIN ASPART 100 UNIT/ML IJ SOLN: INTRAMUSCULAR | 1 refills | Status: DC

## 2023-08-15 NOTE — Telephone Encounter (Signed)
Done

## 2023-08-15 NOTE — Telephone Encounter (Signed)
Patient advised that he need a refill on medication and pharmacy sent patient here to there office

## 2023-08-20 ENCOUNTER — Other Ambulatory Visit: Payer: Self-pay

## 2023-08-29 ENCOUNTER — Ambulatory Visit (INDEPENDENT_AMBULATORY_CARE_PROVIDER_SITE_OTHER): Payer: Medicare HMO

## 2023-08-29 VITALS — BP 120/62 | HR 67 | Temp 97.7°F | Ht 75.0 in | Wt 211.0 lb

## 2023-08-29 DIAGNOSIS — Z0000 Encounter for general adult medical examination without abnormal findings: Secondary | ICD-10-CM

## 2023-08-29 NOTE — Patient Instructions (Addendum)
Christopher [name] , Thank you for taking time to come for your Medicare Wellness Visit. I appreciate your ongoing commitment to your health goals. Please review the following plan we discussed and let me know if I can assist you in the future.   Referrals/Orders/Follow-Ups/Clinician Recommendations:   This is a list of the screening recommended for you and due dates:  Health Maintenance  Topic Date Due   Zoster (Shingles) Vaccine (1 of 2) Never done   Complete foot exam   10/05/2022   Eye exam for diabetics  01/28/2023   Flu Shot  07/17/2023   COVID-19 Vaccine (7 - 2023-24 season) 08/17/2023   Hemoglobin A1C  02/06/2024   Yearly kidney function blood test for diabetes  04/18/2024   Yearly kidney health urinalysis for diabetes  08/05/2024   Medicare Annual Wellness Visit  08/28/2024   Colon Cancer Screening  11/02/2026   DTaP/Tdap/Td vaccine (3 - Tdap) 08/26/2028   Pneumonia Vaccine  Completed   Hepatitis C Screening  Completed   HPV Vaccine  Aged Out    Advanced directives: (Declined) Advance directive discussed with you today. Even though you declined this today, please call our office should you change your mind, and we can give you the proper paperwork for you to fill out.  Next Medicare Annual Wellness Visit scheduled for next year: Yes

## 2023-08-29 NOTE — Progress Notes (Signed)
Subjective:   Christopher [name] is a 77 y.o. male who presents for Medicare Annual/Subsequent preventive examination.  Visit Complete: In person   Cardiac Risk Factors include: advanced age (>11men, >58 women);diabetes mellitus;male gender;hypertension     Objective:    Today's Vitals   08/29/23 1105  BP: 120/62  Pulse: 67  Temp: 97.7 F (36.5 C)  TempSrc: Oral  SpO2: 99%  Weight: 211 lb (95.7 kg)  Height: 6\' 3"  (1.905 m)   Body mass index is 26.37 kg/m.     08/29/2023   11:23 AM 04/19/2023   11:39 PM 12/03/2022    2:00 PM 11/29/2022    9:51 AM 11/01/2022   12:31 AM 10/06/2022    7:53 PM 10/06/2022    7:57 AM  Advanced Directives  Does Patient Have a Medical Advance Directive? No No No No No No No  Would patient like information on creating a medical advance directive? No - Patient declined  No - Guardian declined;No - Patient declined  No - Patient declined No - Patient declined     Current Medications (verified) Outpatient Encounter Medications as of 08/29/2023  Medication Sig   atorvastatin (LIPITOR) 80 MG tablet Take 1 tablet (80 mg total) by mouth at bedtime.   brimonidine (ALPHAGAN) 0.15 % ophthalmic solution Place 1 drop into both eyes 2 (two) times daily.   Continuous Blood Gluc Receiver (DEXCOM G7 RECEIVER) DEVI Use to check blood sugar   Continuous Blood Gluc Sensor (DEXCOM G6 SENSOR) MISC Change every 10 days   Continuous Blood Gluc Transmit (DEXCOM G6 TRANSMITTER) MISC Use every 3 months   docusate sodium (COLACE) 100 MG capsule Take 1 capsule (100 mg total) by mouth 2 (two) times daily. (Patient taking differently: Take 100 mg by mouth as needed.)   dorzolamide-timolol (COSOPT) 22.3-6.8 MG/ML ophthalmic solution Place 1 drop into both eyes 2 (two) times daily.    insulin aspart (NOVOLOG) 100 UNIT/ML injection Up to 50 units daily in insulin pump   Insulin Disposable Pump (OMNIPOD 5 G6 INTRO, GEN 5,) KIT Inject into the skin.   Insulin Disposable Pump  (OMNIPOD 5 G6 PODS, GEN 5,) MISC SMARTSIG:SUB-Q Every 3 Days   Insulin Pen Needle 32G X 4 MM MISC Use as instructed to administer insulin 4-5X daily. E10.65   lisinopril (ZESTRIL) 2.5 MG tablet Take 1 tablet (2.5 mg total) by mouth daily.   Multiple Vitamins-Minerals (MULTIVITAMIN ADULTS 50+) TABS Take 1 tablet by mouth daily.   polyethylene glycol (MIRALAX / GLYCOLAX) 17 g packet Take 17 g by mouth daily. (Patient taking differently: Take 17 g by mouth as needed.)   No facility-administered encounter medications on file as of 08/29/2023.    Allergies (verified) Patient has no known allergies.   History: Past Medical History:  Diagnosis Date   Diabetes mellitus type II    GERD (gastroesophageal reflux disease)    Glaucoma    Hyperlipidemia    Hypertension    Past Surgical History:  Procedure Laterality Date   COLONOSCOPY     HERNIA REPAIR     PATELLA FRACTURE SURGERY  1993   Family History  Problem Relation Age of Onset   Alcohol abuse Father    Diabetes Sister    Diabetes Brother    Diabetes Brother    Colon cancer Neg Hx    Esophageal cancer Neg Hx    Stomach cancer Neg Hx    Rectal cancer Neg Hx    Social History   Socioeconomic History  Marital status: Married    Spouse name: Not on file   Number of children: Not on file   Years of education: Not on file   Highest education level: Not on file  Occupational History   Not on file  Tobacco Use   Smoking status: Never   Smokeless tobacco: Never  Vaping Use   Vaping status: Never Used  Substance and Sexual Activity   Alcohol use: No   Drug use: No   Sexual activity: Not on file  Other Topics Concern   Not on file  Social History Narrative   He enjoys playing golf    Social Determinants of Health   Financial Resource Strain: Low Risk  (08/29/2023)   Overall Financial Resource Strain (CARDIA)    Difficulty of Paying Living Expenses: Not hard at all  Food Insecurity: No Food Insecurity (08/29/2023)    Hunger Vital Sign    Worried About Running Out of Food in the Last Year: Never true    Ran Out of Food in the Last Year: Never true  Transportation Needs: No Transportation Needs (08/29/2023)   PRAPARE - Administrator, Civil Service (Medical): No    Lack of Transportation (Non-Medical): No  Physical Activity: Inactive (08/29/2023)   Exercise Vital Sign    Days of Exercise per Week: 0 days    Minutes of Exercise per Session: 0 min  Stress: No Stress Concern Present (08/29/2023)   [name]    Feeling of Stress : Not at all  Social Connections: Socially Integrated (08/29/2023)   Social Connection and Isolation Panel [NHANES]    Frequency of Communication with Friends and Family: More than three times a week    Frequency of Social Gatherings with Friends and Family: More than three times a week    Attends Religious Services: More than 4 times per year    Active Member of [employer] or Organizations: Yes    Attends Engineer, structural: More than 4 times per year    Marital Status: Married    Tobacco Counseling Counseling given: Not Answered   Clinical Intake:  Pre-visit preparation completed: Yes  Pain : No/denies pain     BMI - recorded: 26.37 Nutritional Status: BMI 25 -29 Overweight Nutritional Risks: None Diabetes: Yes CBG done?: Yes CBG resulted in Enter/ Edit results?: Yes (CBG 152 Per patient) Did pt. bring in CBG monitor from home?: No  How often do you need to have someone help you when you read instructions, pamphlets, or other written materials from your doctor or pharmacy?: 1 - Never  Interpreter Needed?: No  Information entered by :: Theresa Mulligan LPN   Activities of Daily Living    08/29/2023   11:22 AM 03/19/2023    7:05 AM  In your present state of health, do you have any difficulty performing the following activities:  Hearing? 0 0  Vision? 0 1  Comment  wears reading  glasses  Difficulty concentrating or making decisions? 0 1  Walking or climbing stairs? 0 0  Dressing or bathing? 0 0  Doing errands, shopping? 0 0  Preparing Food and eating ? N   Using the Toilet? N   In the past six months, have you accidently leaked urine? N   Do you have problems with loss of bowel control? N   Managing your Medications? N   Managing your Finances? N   Housekeeping or managing your Housekeeping? N  Patient Care Team: Shirline Frees, NP as PCP - General (Family Medicine) Corky Crafts, MD as PCP - Cardiology (Cardiology) Romero Belling, MD (Inactive) as Consulting Physician (Endocrinology) Reather Littler, MD (Inactive) as Consulting Physician (Endocrinology) Sherrill Raring, [facility] (Pharmacist)  Indicate any recent Medical Services you may have received from other than [facility] providers in the past year (date may be approximate).     Assessment:   This is a routine wellness examination for [place].  Hearing/Vision screen Hearing Screening - Comments:: Denies hearing difficulties   Vision Screening - Comments:: Wears reading glasses - up to date with routine eye exams with  Dr Hyacinth Meeker   Goals Addressed               This Visit's Progress     Play more Golf (pt-stated)         Depression Screen    08/29/2023   11:20 AM 08/13/2023    2:31 PM 04/03/2022    9:11 AM 12/14/2021    7:47 AM 12/13/2020   11:41 AM 10/06/2019    8:11 AM 03/05/2019    8:02 AM  PHQ 2/9 Scores  PHQ - 2 Score 0 0 0 0 0 0 0  PHQ- 9 Score 0 2 4 0 0 0     Fall Risk    08/29/2023   11:22 AM 08/13/2023    2:31 PM 03/19/2023    7:05 AM 04/03/2022    9:12 AM 12/14/2021    7:38 AM  Fall Risk   Falls in the past year? 0 0 0 1 0  Number falls in past yr: 0  0 0 0  Injury with Fall? 0  0 0 0  Risk for fall due to : No Fall Risks  No Fall Risks Impaired balance/gait   Follow up Falls prevention discussed  Falls evaluation completed Falls evaluation completed     MEDICARE RISK  AT HOME: Medicare Risk at Home Any stairs in or around the home?: No If so, are there any without handrails?: No Home free of loose throw rugs in walkways, pet beds, electrical cords, etc?: Yes Adequate lighting in your home to reduce risk of falls?: Yes Life alert?: No Use of a cane, walker or w/c?: No Grab bars in the bathroom?: Yes Shower chair or bench in shower?: Yes Elevated toilet seat or a handicapped toilet?: Yes  TIMED UP AND GO:  Was the test performed?  Yes  Length of time to ambulate 10 feet: 10 sec Gait slow and steady without use of assistive device    Cognitive Function:        08/29/2023   11:24 AM 08/29/2023   11:23 AM  6CIT Screen  What Year? 0 points 0 points  What month? 0 points 0 points  What time? 0 points 0 points  Count back from 20 0 points 0 points  Months in reverse 0 points 0 points  Repeat phrase 0 points 10 points  Total Score 0 points 10 points    Immunizations Immunization History  Administered Date(s) Administered   COVID-19, mRNA, vaccine(Comirnaty)12 years and older 10/02/2022   Fluad Quad(high Dose 65+) 10/06/2019, 12/13/2020, 10/19/2021, 10/30/2022   Influenza Whole 09/13/2009, 01/01/2011   Influenza, High Dose Seasonal PF 12/12/2014, 08/22/2015, 09/17/2016, 10/27/2017, 08/26/2018   Influenza,inj,Quad PF,6+ Mos 08/25/2013   PFIZER Comirnaty(Gray Top)Covid-19 Tri-Sucrose Vaccine 04/18/2021   PFIZER(Purple Top)SARS-COV-2 Vaccination 02/18/2020, 03/15/2020, 09/30/2020   Pfizer Covid-19 Vaccine Bivalent Booster 74yrs & up 10/22/2021   Pneumococcal Conjugate-13  01/08/2016   Pneumococcal Polysaccharide-23 09/13/2009, 03/31/2017   Td 05/08/2007, 08/26/2018    TDAP status: Up to date  Flu Vaccine status: Due, Education has been provided regarding the importance of this vaccine. Advised may receive this vaccine at local pharmacy or Health Dept. Aware to provide a copy of the vaccination record if obtained from local pharmacy or Health  Dept. Verbalized acceptance and understanding.  Pneumococcal vaccine status: Up to date  Covid-19 vaccine status: Declined, Education has been provided regarding the importance of this vaccine but patient still declined. Advised may receive this vaccine at local pharmacy or Health Dept.or vaccine clinic. Aware to provide a copy of the vaccination record if obtained from local pharmacy or Health Dept. Verbalized acceptance and understanding.  Qualifies for Shingles Vaccine? Yes   Zostavax completed No   Shingrix Completed?: No.    Education has been provided regarding the importance of this vaccine. Patient has been advised to call insurance company to determine out of pocket expense if they have not yet received this vaccine. Advised may also receive vaccine at local pharmacy or Health Dept. Verbalized acceptance and understanding.  Screening Tests Health Maintenance  Topic Date Due   Zoster Vaccines- Shingrix (1 of 2) Never done   FOOT EXAM  10/05/2022   OPHTHALMOLOGY EXAM  01/28/2023   INFLUENZA VACCINE  07/17/2023   COVID-19 Vaccine (7 - 2023-24 season) 08/17/2023   HEMOGLOBIN A1C  02/06/2024   Diabetic kidney evaluation - eGFR measurement  04/18/2024   Diabetic kidney evaluation - Urine ACR  08/05/2024   Medicare Annual Wellness (AWV)  08/28/2024   Colonoscopy  11/02/2026   DTaP/Tdap/Td (3 - Tdap) 08/26/2028   Pneumonia Vaccine 38+ Years old  Completed   Hepatitis C Screening  Completed   HPV VACCINES  Aged Out    Health Maintenance  Health Maintenance Due  Topic Date Due   Zoster Vaccines- Shingrix (1 of 2) Never done   FOOT EXAM  10/05/2022   OPHTHALMOLOGY EXAM  01/28/2023   INFLUENZA VACCINE  07/17/2023   COVID-19 Vaccine (7 - 2023-24 season) 08/17/2023        Additional Screening:  Hepatitis C Screening: does qualify; Completed 03/20/23  Vision Screening: Recommended annual ophthalmology exams for early detection of glaucoma and other disorders of the eye. Is  the patient up to date with their annual eye exam?  Yes  Who is the provider or what is the name of the office in which the patient attends annual eye exams? Dr Hyacinth Meeker If pt is not established with a provider, would they like to be referred to a provider to establish care? No .   Dental Screening: Recommended annual dental exams for proper oral hygiene  Diabetic Foot Exam: Diabetic Foot Exam: Overdue, Pt has been advised about the importance in completing this exam. Pt is scheduled for diabetic foot exam on Deferred.  Community Resource Referral / Chronic Care Management:  CRR required this visit?  No   CCM required this visit?  No     Plan:     I have personally reviewed and noted the following in the patient's chart:   Medical and social history Use of alcohol, tobacco or illicit drugs  Current medications and supplements including opioid prescriptions. Patient is not currently taking opioid prescriptions. Functional ability and status Nutritional status Physical activity Advanced directives List of other physicians Hospitalizations, surgeries, and ER visits in previous 12 months Vitals Screenings to include cognitive, depression, and falls Referrals and appointments  In addition, I have reviewed and discussed with patient certain preventive protocols, quality metrics, and best practice recommendations. A written personalized care plan for preventive services as well as general preventive health recommendations were provided to patient.     Tillie Rung, LPN   03/29/2439   After Visit Summary: Given  Nurse Notes: None

## 2023-09-05 ENCOUNTER — Other Ambulatory Visit (HOSPITAL_BASED_OUTPATIENT_CLINIC_OR_DEPARTMENT_OTHER): Payer: Self-pay

## 2023-09-05 MED ORDER — INFLUENZA VAC A&B SURF ANT ADJ 0.5 ML IM SUSY
0.5000 mL | PREFILLED_SYRINGE | Freq: Once | INTRAMUSCULAR | 0 refills | Status: AC
  Filled 2023-09-05: qty 0.5, 1d supply, fill #0

## 2023-09-05 MED ORDER — COVID-19 MRNA VAC-TRIS(PFIZER) 30 MCG/0.3ML IM SUSY
0.3000 mL | PREFILLED_SYRINGE | Freq: Once | INTRAMUSCULAR | 0 refills | Status: AC
  Filled 2023-09-05: qty 0.3, 1d supply, fill #0

## 2023-09-12 ENCOUNTER — Telehealth: Payer: Self-pay

## 2023-09-12 ENCOUNTER — Encounter: Payer: Self-pay | Admitting: Endocrinology

## 2023-09-12 NOTE — Telephone Encounter (Signed)
Jerrye Beavers is calling on behalf of her husband Christopher [name] stating this his blood sugars are running low I've printed off his Omnipod readings and scanned them in to [employer] under media   Taking  Novolog 12 units at breakfast/ 6 units at lunch / 6 units at dinner   Dr Roosevelt Locks please advise on Dr Nicholos Johns behalf

## 2023-09-15 NOTE — Telephone Encounter (Signed)
I spoke to Mrs Christopher [name] and she took the new Recommendation and will see how this works

## 2023-09-15 NOTE — Telephone Encounter (Signed)
Insulin/CGM download reviewed scanned into media.  Most of the blood sugar are acceptable, noted occasional mild hypoglycemia after supper and late afternoon.  Noticed frequent suspension of basal rates on auto mode.  He has been taking carb count of 12 units with breakfast, 6 unit with lunch and 6 units with supper.  I would recommend to decrease carb count for lunch and supper with adjustment as follows.  Recommendation: Take carb count of meal 12 units with breakfast, 4 units with lunch and 4 units with supper.  [place] Jerrit Horen, MD [facility] Endocrinology [facility] [address] [place], [address] [place], [place] [address] Phone # [phone]

## 2023-09-16 DIAGNOSIS — E1065 Type 1 diabetes mellitus with hyperglycemia: Secondary | ICD-10-CM | POA: Diagnosis not present

## 2023-11-07 ENCOUNTER — Ambulatory Visit: Payer: Medicare HMO | Admitting: Endocrinology

## 2023-11-07 ENCOUNTER — Encounter: Payer: Self-pay | Admitting: Endocrinology

## 2023-11-07 VITALS — BP 110/70 | HR 60 | Resp 20 | Ht 75.0 in | Wt 212.8 lb

## 2023-11-07 DIAGNOSIS — E109 Type 1 diabetes mellitus without complications: Secondary | ICD-10-CM | POA: Diagnosis not present

## 2023-11-07 LAB — POCT GLYCOSYLATED HEMOGLOBIN (HGB A1C): Hemoglobin A1C: 6.6 % — AB (ref 4.0–5.6)

## 2023-11-07 NOTE — Progress Notes (Signed)
Outpatient Endocrinology Note Christopher Ethelwyn Gilbertson, MD  11/07/23  Patient's Name: Christopher [name]    DOB: 07-09-1946    MRN: [id number]                                                    REASON OF VISIT: Follow up of for type 1 diabetes mellitus  PCP: Shirline Frees, NP  HISTORY OF PRESENT ILLNESS:   Christopher [name] is a 77 y.o. old male with past medical history listed below, is here for follow up of type 2 diabetes mellitus.   Pertinent Diabetes History: Patient was diagnosed with type 1 diabetes mellitus in 2010, insulin therapy was started in 2013.  He was previously on Trulicity and Victoza as well which were stopped later.  He had tried Levemir and NPH insulin in the past.  History of DKA or diabetes related hospitalizations: yes, at least 2 times.  Previous diabetes education: Yes   Chronic Diabetes Complications : Retinopathy: no. Last ophthalmology exam was done on annually. Nephropathy: no, on lisinopril. Peripheral neuropathy: no Coronary artery disease: no Stroke: no  Relevant comorbidities and cardiovascular risk factors: Obesity: no Body mass index is 26.6 kg/m.  Hypertension: yes Hyperlipidemia. yes  Current / Home Diabetic regimen includes:  OmniPod 5 with Dexcom G6, uses NovoLog U100.  Insulin Pump setting:  Basal ( 25.2 units/day) MN- 1.05u/hour Bolus  CHO Ratio (1unit:CHO) MN- 1:1  He uses fixed carb count  for 12 breakfast, 4 -5 for lunch and 4 -5 for supper.  Correction/Sensitivity: MN- 1:35  Target: 130 and 120  Active insulin time: 4 hours  Prior diabetic medications: Trulicity, Victoza, Levemir, NPH.  CONTINUOUS GLUCOSE MONITORING SYSTEM (CGMS) / INSULIN PUMP INTERPRETATION:                         OmniPod 5 Pump & Dexcom G6 Sensor Download (Reviewed and summarized below.)   Glucose Management Indicator: 7.4%  Average total daily insulin:  50 units, Basal: 59%, Bolus: 41%      Trends:  Patient has variable blood sugar with meals  especially with lunch and supper and some time with breakfast, had hyperglycemia with blood sugar up to 250 range.  Few times she had hyperglycemia with breakfast, related with loose pod.  He had mostly acceptable blood sugar overnight.  No significant hypoglycemia.  Hypoglycemia: Patient has no hypoglycemic episodes. Patient has hypoglycemia awareness.    Factors modifying glucose control: 1.  Diabetic diet assessment: 3 meals a day breakfast (oatmeal usually), lunch and dinner however sometimes he eats cake or ice cream.   2.  Staying active or exercising: Playing golf and active at home.  3.  Medication compliance: compliant all of the time.  Interval history  Pump and CGM data as reviewed above.  Hemoglobin A1c today 6.6%.  He has frequent hyperglycemia related with meals.  He also snacks at times.  Denies complaints of numbness or tingling of the feet.  No other complaint today.  REVIEW OF SYSTEMS As per history of present illness.   PAST MEDICAL HISTORY: Past Medical History:  Diagnosis Date   Diabetes mellitus type II    GERD (gastroesophageal reflux disease)    Glaucoma    Hyperlipidemia    Hypertension     PAST SURGICAL HISTORY: Past Surgical History:  Procedure Laterality Date   COLONOSCOPY     HERNIA REPAIR     PATELLA FRACTURE SURGERY  1993    ALLERGIES: No Known Allergies  FAMILY HISTORY:  Family History  Problem Relation Age of Onset   Alcohol abuse Father    Diabetes Sister    Diabetes Brother    Diabetes Brother    Colon cancer Neg Hx    Esophageal cancer Neg Hx    Stomach cancer Neg Hx    Rectal cancer Neg Hx     SOCIAL HISTORY: Social History   Socioeconomic History   Marital status: Married    Spouse name: Not on file   Number of children: Not on file   Years of education: Not on file   Highest education level: Not on file  Occupational History   Not on file  Tobacco Use   Smoking status: Never   Smokeless tobacco: Never  Vaping Use    Vaping status: Never Used  Substance and Sexual Activity   Alcohol use: No   Drug use: No   Sexual activity: Not on file  Other Topics Concern   Not on file  Social History Narrative   He enjoys playing golf    Social Determinants of Health   Financial Resource Strain: Low Risk  (08/29/2023)   Overall Financial Resource Strain (CARDIA)    Difficulty of Paying Living Expenses: Not hard at all  Food Insecurity: No Food Insecurity (08/29/2023)   Hunger Vital Sign    Worried About Running Out of Food in the Last Year: Never true    Ran Out of Food in the Last Year: Never true  Transportation Needs: No Transportation Needs (08/29/2023)   PRAPARE - Administrator, Civil Service (Medical): No    Lack of Transportation (Non-Medical): No  Physical Activity: Inactive (08/29/2023)   Exercise Vital Sign    Days of Exercise per Week: 0 days    Minutes of Exercise per Session: 0 min  Stress: No Stress Concern Present (08/29/2023)   [name]    Feeling of Stress : Not at all  Social Connections: Socially Integrated (08/29/2023)   Social Connection and Isolation Panel [NHANES]    Frequency of Communication with Friends and Family: More than three times a week    Frequency of Social Gatherings with Friends and Family: More than three times a week    Attends Religious Services: More than 4 times per year    Active Member of [employer] or Organizations: Yes    Attends Engineer, structural: More than 4 times per year    Marital Status: Married    MEDICATIONS:  Current Outpatient Medications  Medication Sig Dispense Refill   atorvastatin (LIPITOR) 80 MG tablet Take 1 tablet (80 mg total) by mouth at bedtime. 90 tablet 3   brimonidine (ALPHAGAN) 0.15 % ophthalmic solution Place 1 drop into both eyes 2 (two) times daily.     Continuous Blood Gluc Receiver (DEXCOM G7 RECEIVER) DEVI Use to check blood sugar 1 each 0    Continuous Blood Gluc Sensor (DEXCOM G6 SENSOR) MISC Change every 10 days 9 each 2   Continuous Blood Gluc Transmit (DEXCOM G6 TRANSMITTER) MISC Use every 3 months 1 each 2   docusate sodium (COLACE) 100 MG capsule Take 1 capsule (100 mg total) by mouth 2 (two) times daily. (Patient taking differently: Take 100 mg by mouth as needed.) 10 capsule 0  dorzolamide-timolol (COSOPT) 22.3-6.8 MG/ML ophthalmic solution Place 1 drop into both eyes 2 (two) times daily.      insulin aspart (NOVOLOG) 100 UNIT/ML injection Up to 50 units daily in insulin pump 50 mL 1   Insulin Disposable Pump (OMNIPOD 5 G6 INTRO, GEN 5,) KIT Inject into the skin.     Insulin Disposable Pump (OMNIPOD 5 G6 PODS, GEN 5,) MISC SMARTSIG:SUB-Q Every 3 Days 10 each 5   lisinopril (ZESTRIL) 2.5 MG tablet Take 1 tablet (2.5 mg total) by mouth daily. 90 tablet 3   Multiple Vitamins-Minerals (MULTIVITAMIN ADULTS 50+) TABS Take 1 tablet by mouth daily.     polyethylene glycol (MIRALAX / GLYCOLAX) 17 g packet Take 17 g by mouth daily. (Patient taking differently: Take 17 g by mouth as needed.) 14 each 0   Insulin Pen Needle 32G X 4 MM MISC Use as instructed to administer insulin 4-5X daily. E10.65 (Patient not taking: Reported on 11/07/2023) 400 each 1   No current facility-administered medications for this visit.    PHYSICAL EXAM: Vitals:   11/07/23 0947  BP: 110/70  Pulse: 60  Resp: 20  SpO2: 99%  Weight: 212 lb 12.8 oz (96.5 kg)  Height: 6\' 3"  (1.905 m)   Body mass index is 26.6 kg/m.  Wt Readings from Last 3 Encounters:  11/07/23 212 lb 12.8 oz (96.5 kg)  08/29/23 211 lb (95.7 kg)  08/13/23 212 lb (96.2 kg)    General: Well developed, well nourished male in no apparent distress.  HEENT: AT/[place], no external lesions.  Eyes: Conjunctiva clear and no icterus. Neck: Neck supple  Lungs: Respirations not labored Neurologic: Alert, oriented, normal speech Extremities / Skin: Dry. No sores or rashes noted.  Psychiatric:  Does not appear depressed or anxious  Diabetic Foot Exam - Simple   No data filed     LABS Reviewed Lab Results  Component Value Date   HGBA1C 6.6 (A) 11/07/2023   HGBA1C 6.7 (A) 08/06/2023   HGBA1C 6.6 (A) 04/30/2023   Lab Results  Component Value Date   FRUCTOSAMINE 427 (H) 10/04/2022   FRUCTOSAMINE 479 (H) 09/16/2016   Lab Results  Component Value Date   CHOL 214 (H) 03/20/2023   HDL 51.20 03/20/2023   LDLCALC 149 (H) 03/20/2023   LDLDIRECT 135.3 06/17/2013   TRIG 67.0 03/20/2023   CHOLHDL 4 03/20/2023   Lab Results  Component Value Date   MICRALBCREAT 0.3 08/06/2023   MICRALBCREAT 0.9 05/20/2022   Lab Results  Component Value Date   CREATININE 1.67 (H) 04/19/2023   Lab Results  Component Value Date   GFR 47.86 (L) 03/20/2023    ASSESSMENT / PLAN  1. Controlled diabetes mellitus type 1 without complications (HCC)     Diabetes Mellitus type 1, complicated by no known complications. - Diabetic status / severity: Controlled.  Lab Results  Component Value Date   HGBA1C 6.6 (A) 11/07/2023    - Hemoglobin A1c goal <7%   - Medications:  Insulin pump setting changed as follows: OmniPod 5 insulin Pump setting: Using NovoLog U100.  With Dexcom G6.  Insulin Pump setting:  Basal MN- 1.05u/hour Bolus  CHO Ratio (1unit:CHO) MN- 1:1  He uses fixed carb count 12 for breakfast, 4-6 for lunch and 4-6 for supper.  He is mainly using for for lunch and supper advised to use up to 6 carb for lunch and supper can adjust based on meal size and type.  Advised to take 1-2 carb for sugary snacks like  ice cream/cracker.   Correction/Sensitivity: MN- 1:35  Target: 130 and 120  Active insulin time: 4 hours  - Home glucose testing: continue CGM and check blood glucose as needed.  - Discussed/ Gave Hypoglycemia treatment plan.  # Consult : not required at this time.   # Annual urine for microalbuminuria/ creatinine ratio, no microalbuminuria currently, continue  ACE/ARB /lisinopril.   Last  Lab Results  Component Value Date   MICRALBCREAT 0.3 08/06/2023    # Foot check nightly.  # Annual dilated diabetic eye exams.   - Diet: Eat reasonable portion sizes to promote a healthy weight - Life style / activity / exercise: Discussed.  2. Blood pressure  -  BP Readings from Last 1 Encounters:  11/07/23 110/70    - Control is in target.  - No change in current plans.  3. Lipid status / Hyperlipidemia - Last  Lab Results  Component Value Date   LDLCALC 149 (H) 03/20/2023   - Continue atorvastatin 80 mg daily.  Managed by primary care provider.  Diagnoses and all orders for this visit:  Controlled diabetes mellitus type 1 without complications (HCC) -     POCT glycosylated hemoglobin (Hb A1C)    DISPOSITION Follow up in clinic in 3 months suggested.   All questions answered and patient verbalized understanding of the plan.  Christopher Kalman Nylen, MD [facility] Endocrinology [facility] [address] [place], [address] [place], [place] [address] Phone # [phone]  At least part of this note was generated using voice recognition software. Inadvertent word errors may have occurred, which were not recognized during the proofreading process.

## 2023-11-07 NOTE — Patient Instructions (Signed)
He uses fixed carb count  for 12 breakfast, do 4 - 6 for lunch and 4 - 6  for supper.

## 2023-11-25 DIAGNOSIS — H5203 Hypermetropia, bilateral: Secondary | ICD-10-CM | POA: Diagnosis not present

## 2023-11-27 ENCOUNTER — Other Ambulatory Visit: Payer: Self-pay

## 2023-11-27 DIAGNOSIS — E109 Type 1 diabetes mellitus without complications: Secondary | ICD-10-CM

## 2023-11-27 MED ORDER — OMNIPOD 5 DEXG7G6 PODS GEN 5 MISC: 5 refills | Status: DC

## 2023-11-27 NOTE — Telephone Encounter (Signed)
Omnipods refill request complete, patient made aware via phone.

## 2023-12-02 ENCOUNTER — Other Ambulatory Visit: Payer: Self-pay

## 2023-12-02 ENCOUNTER — Encounter (HOSPITAL_BASED_OUTPATIENT_CLINIC_OR_DEPARTMENT_OTHER): Payer: Self-pay | Admitting: Emergency Medicine

## 2023-12-02 DIAGNOSIS — E1165 Type 2 diabetes mellitus with hyperglycemia: Secondary | ICD-10-CM | POA: Insufficient documentation

## 2023-12-02 DIAGNOSIS — Z794 Long term (current) use of insulin: Secondary | ICD-10-CM | POA: Diagnosis not present

## 2023-12-02 LAB — CBC
HCT: 38.6 % — ABNORMAL LOW (ref 39.0–52.0)
Hemoglobin: 13.8 g/dL (ref 13.0–17.0)
MCH: 26.2 pg (ref 26.0–34.0)
MCHC: 35.8 g/dL (ref 30.0–36.0)
MCV: 73.4 fL — ABNORMAL LOW (ref 80.0–100.0)
Platelets: 230 10*3/uL (ref 150–400)
RBC: 5.26 MIL/uL (ref 4.22–5.81)
RDW: 14.5 % (ref 11.5–15.5)
WBC: 4.8 10*3/uL (ref 4.0–10.5)
nRBC: 0 % (ref 0.0–0.2)

## 2023-12-02 LAB — BASIC METABOLIC PANEL
Anion gap: 9 (ref 5–15)
BUN: 21 mg/dL (ref 8–23)
CO2: 23 mmol/L (ref 22–32)
Calcium: 9.2 mg/dL (ref 8.9–10.3)
Chloride: 101 mmol/L (ref 98–111)
Creatinine, Ser: 1.56 mg/dL — ABNORMAL HIGH (ref 0.61–1.24)
GFR, Estimated: 46 mL/min — ABNORMAL LOW (ref >60)
Glucose, Bld: 452 mg/dL — ABNORMAL HIGH (ref 70–99)
Potassium: 4.7 mmol/L (ref 3.5–5.1)
Sodium: 133 mmol/L — ABNORMAL LOW (ref 135–145)

## 2023-12-02 LAB — CBG MONITORING, ED: Glucose-Capillary: 412 mg/dL — ABNORMAL HIGH (ref 70–99)

## 2023-12-02 NOTE — ED Triage Notes (Signed)
Patient presents with hyperglycemia. States "monitor beeped saying sugar was high".  Patient denies complaints. FSBS 412 mg/dl in triage.

## 2023-12-03 ENCOUNTER — Emergency Department (HOSPITAL_BASED_OUTPATIENT_CLINIC_OR_DEPARTMENT_OTHER)
Admission: EM | Admit: 2023-12-03 | Discharge: 2023-12-03 | Disposition: A | Discharge status: Home / Self Care | Payer: Medicare HMO | Attending: Emergency Medicine | Admitting: Emergency Medicine

## 2023-12-03 DIAGNOSIS — R739 Hyperglycemia, unspecified: Secondary | ICD-10-CM

## 2023-12-03 LAB — URINALYSIS, ROUTINE W REFLEX MICROSCOPIC
Bacteria, UA: NONE SEEN
Bilirubin Urine: NEGATIVE
Glucose, UA: >1000 mg/dL — AB
Hgb urine dipstick: NEGATIVE
Ketones, ur: 40 mg/dL — AB
Leukocytes,Ua: NEGATIVE
Nitrite: NEGATIVE
Protein, ur: NEGATIVE mg/dL
Specific Gravity, Urine: 1.028 (ref 1.005–1.030)
pH: 5 (ref 5.0–8.0)

## 2023-12-03 LAB — CBG MONITORING, ED
Glucose-Capillary: 411 mg/dL — ABNORMAL HIGH (ref 70–99)
Glucose-Capillary: 422 mg/dL — ABNORMAL HIGH (ref 70–99)
Glucose-Capillary: 340 mg/dL — ABNORMAL HIGH (ref 70–99)

## 2023-12-03 MED ORDER — INSULIN ASPART 100 UNIT/ML IJ SOLN
6.0000 [IU] | Freq: Once | INTRAMUSCULAR | Status: AC
  Administered 2023-12-03: 6 [IU] via INTRAVENOUS

## 2023-12-03 MED ORDER — SODIUM CHLORIDE 0.9 % IV BOLUS
1000.0000 mL | Freq: Once | INTRAVENOUS | Status: AC
  Administered 2023-12-03: 1000 mL via INTRAVENOUS

## 2023-12-03 MED ORDER — INSULIN REGULAR HUMAN 100 UNIT/ML IJ SOLN: 10.0000 [IU] | Freq: Once | INTRAMUSCULAR | Status: DC

## 2023-12-03 NOTE — ED Notes (Signed)
Pt. Wife states he gave him insulin through pump

## 2023-12-03 NOTE — ED Provider Notes (Signed)
[place] EMERGENCY DEPARTMENT AT [facility]  Provider Note  CSN: [id number] Arrival date & time: 12/02/23 2110  History Chief Complaint  Patient presents with   Hyperglycemia    Christopher [name] is a 77 y.o. male with history of DM managed with Dexcom and Omnipod system is here with wife for evaluation of hyperglycemia. Wife reports she changed the Dexcom earlier in the day of arrival and in the afternoon it began reading high. She is due to change his Omnipod tomorrow but notes that the monitoring app still lists 50units of insulin in the Omnipod. He is otherwise feeling well.    Home Medications Prior to Admission medications   Medication Sig Start Date End Date Taking? Authorizing Provider  atorvastatin (LIPITOR) 80 MG tablet Take 1 tablet (80 mg total) by mouth at bedtime. 12/20/22   Sharlene Dory, PA-C  brimonidine (ALPHAGAN) 0.15 % ophthalmic solution Place 1 drop into both eyes 2 (two) times daily.    [provider]  Continuous Blood Gluc Receiver (DEXCOM G7 RECEIVER) DEVI Use to check blood sugar 11/19/22   Reather Littler, MD  Continuous Blood Gluc Sensor (DEXCOM G6 SENSOR) MISC Change every 10 days 11/19/22   Reather Littler, MD  Continuous Blood Gluc Transmit (DEXCOM G6 TRANSMITTER) MISC Use every 3 months 11/19/22   Reather Littler, MD  docusate sodium (COLACE) 100 MG capsule Take 1 capsule (100 mg total) by mouth 2 (two) times daily. Patient taking differently: Take 100 mg by mouth as needed. 12/10/22   Barnetta Chapel, MD  dorzolamide-timolol (COSOPT) 22.3-6.8 MG/ML ophthalmic solution Place 1 drop into both eyes 2 (two) times daily.  03/03/11   [provider]  insulin aspart (NOVOLOG) 100 UNIT/ML injection Up to 50 units daily in insulin pump 08/15/23   Thapa, [place], MD  Insulin Disposable Pump (OMNIPOD 5 DEXG7G6 PODS GEN 5) MISC SMARTSIG:SUB-Q Every 3 DaysSMARTSIG:SUB-Q Every 3 Days 11/27/23   Thapa, [place], MD  Insulin Disposable Pump (OMNIPOD 5 G6 INTRO,  GEN 5,) KIT Inject into the skin. 12/10/22   [provider]  Insulin Pen Needle 32G X 4 MM MISC Use as instructed to administer insulin 4-5X daily. E10.65 Patient not taking: Reported on 11/07/2023 10/29/22   Reather Littler, MD  lisinopril (ZESTRIL) 2.5 MG tablet Take 1 tablet (2.5 mg total) by mouth daily. 03/19/23   Nafziger, Kandee Keen, NP  Multiple Vitamins-Minerals (MULTIVITAMIN ADULTS 50+) TABS Take 1 tablet by mouth daily.    [provider]  polyethylene glycol (MIRALAX / GLYCOLAX) 17 g packet Take 17 g by mouth daily. Patient taking differently: Take 17 g by mouth as needed. 12/11/22   Barnetta Chapel, MD     Allergies    Patient has no known allergies.   Review of Systems   Review of Systems Please see HPI for pertinent positives and negatives  Physical Exam BP 126/74   Pulse 84   Temp 98.5 F (36.9 C) (Oral)   Resp 14   SpO2 100%   Physical Exam Vitals and nursing note reviewed.  Constitutional:      Appearance: Normal appearance.  HENT:     Head: Normocephalic and atraumatic.     Nose: Nose normal.     Mouth/Throat:     Mouth: Mucous membranes are moist.  Eyes:     Extraocular Movements: Extraocular movements intact.     Conjunctiva/sclera: Conjunctivae normal.  Cardiovascular:     Rate and Rhythm: Normal rate.  Pulmonary:  Effort: Pulmonary effort is normal.     Breath sounds: Normal breath sounds.  Abdominal:     General: Abdomen is flat.     Palpations: Abdomen is soft.     Tenderness: There is no abdominal tenderness.  Musculoskeletal:        General: No swelling. Normal range of motion.     Cervical back: Neck supple.  Skin:    General: Skin is warm and dry.  Neurological:     General: No focal deficit present.     Mental Status: He is alert.  Psychiatric:        Mood and Affect: Mood normal.     ED Results / Procedures / Treatments   EKG None  Procedures Procedures  Medications Ordered in the ED Medications  sodium  chloride 0.9 % bolus 1,000 mL (0 mLs Intravenous Stopped 12/03/23 0402)  insulin aspart (novoLOG) injection 6 Units (6 Units Intravenous Given 12/03/23 0224)    Initial Impression and Plan  Patient here with hyperglycemia and an Omnipod that appears to have malfunctioned. Omnipod was removed and the catheter is at an acute angle. He had labs done in triage showing unremarkable CBC, BMP with hyperglycemia, but no acidosis and CKD at baseline.UA with glucosuria. Patient's wife reports she gave him some subcutaneous insulin while in the waiting room about an hour ago, will recheck CBG now, if improved may be able to go home and replace his Omnipod when he gets there. If still significantly elevated, will give IVF and additional insulin here.    ED Course   Clinical Course as of 12/03/23 0403  Wed Dec 03, 2023  0205 CBG remains elevated, will give a dose of IV insulin and a liter of saline here and then recheck.  [CS]  0402 CBG improved. Will d/c home with instructions to replace his Omnipod when he gets there, PCP follow up, RTED for any other concerns.   [CS]    Clinical Course User Index [CS] Pollyann Savoy, MD     MDM Rules/Calculators/A&P Medical Decision Making Given presenting complaint, I considered that admission might be necessary. After review of results from ED lab and/or imaging studies, admission to the hospital is not indicated at this time.    Problems Addressed: Hyperglycemia: acute illness or injury  Amount and/or Complexity of Data Reviewed Labs: ordered. Decision-making details documented in ED Course.  Risk Prescription drug management. Decision regarding hospitalization.     Final Clinical Impression(s) / ED Diagnoses Final diagnoses:  Hyperglycemia    Rx / DC Orders ED Discharge Orders     None        Pollyann Savoy, MD 12/03/23 0403

## 2023-12-08 ENCOUNTER — Telehealth: Payer: Self-pay

## 2023-12-08 NOTE — Transitions of Care (Post Inpatient/ED Visit) (Signed)
12/08/2023  Name: Christopher [name] MRN: [id number] DOB: 1946/11/02  Today's TOC FU Call Status: Today's TOC FU Call Status:: Successful TOC FU Call Completed TOC FU Call Complete Date: 12/08/23 Patient's Name and Date of Birth confirmed.  Transition Care Management Follow-up Telephone Call Date of Discharge: 12/03/23 Discharge Facility: [facility] ([facility]) Type of Discharge: Emergency Department Reason for ED Visit: Endocrine Endocrine Diagnosis:  (hyperglycemia) How have you been since you were released from the hospital?: Better Any questions or concerns?: No  Items Reviewed: Did you receive and understand the discharge instructions provided?: Yes Medications obtained,verified, and reconciled?: Yes (Medications Reviewed) Any new allergies since your discharge?: No Dietary orders reviewed?: NA Do you have support at home?: Yes People in Home: spouse  Medications Reviewed Today: Medications Reviewed Today     Reviewed by Ervine Witucki, Jordan Hawks, CMA (Certified Medical Assistant) on 12/08/23 at 1623  Med List Status: <None>   Medication Order Taking? Sig Documenting Provider Last Dose Status Informant  atorvastatin (LIPITOR) 80 MG tablet [id number] No Take 1 tablet (80 mg total) by mouth at bedtime. Sharlene Dory, PA-C Taking Active   brimonidine (ALPHAGAN) 0.15 % ophthalmic solution [id number] No Place 1 drop into both eyes 2 (two) times daily. [provider] Taking Active Spouse/Significant Other, Pharmacy Records  Continuous Blood Gluc Receiver (DEXCOM G7 [place]) DEVI [id number] No Use to check blood sugar Reather Littler, MD Taking Active Spouse/Significant Other, Pharmacy Records  Continuous Blood Gluc Sensor (DEXCOM G6 SENSOR) MISC [id number] No Change every 10 days Reather Littler, MD Taking Active Spouse/Significant Other, Pharmacy Records  Continuous Blood Gluc Transmit (DEXCOM G6 TRANSMITTER) MISC [id number] No Use every 3 months Reather Littler, MD Taking Active  Spouse/Significant Other, Pharmacy Records  docusate sodium (COLACE) 100 MG capsule [id number] No Take 1 capsule (100 mg total) by mouth 2 (two) times daily.  Patient taking differently: Take 100 mg by mouth as needed.   Barnetta Chapel, MD Taking Active   dorzolamide-timolol (COSOPT) 22.3-6.8 MG/ML ophthalmic solution [id number] No Place 1 drop into both eyes 2 (two) times daily.  [provider] Taking Active Spouse/Significant Other, Pharmacy Records  insulin aspart (NOVOLOG) 100 UNIT/ML injection [id number] No Up to 50 units daily in insulin pump Thapa, [place], MD Taking Active   Insulin Disposable Pump (OMNIPOD 5 DEXG7G6 PODS GEN 5) MISC [id number]  SMARTSIG:SUB-Q Every 3 DaysSMARTSIG:SUB-Q Every 3 Days Thapa, [place], MD  Active   Insulin Disposable Pump (OMNIPOD 5 G6 INTRO, GEN 5,) KIT [id number] No Inject into the skin. [provider] Taking Active   Insulin Pen Needle 32G X 4 MM MISC [id number] No Use as instructed to administer insulin 4-5X daily. E10.65  Patient not taking: Reported on 11/07/2023   Reather Littler, MD Not Taking Active Spouse/Significant Other, Pharmacy Records  lisinopril (ZESTRIL) 2.5 MG tablet [id number] No Take 1 tablet (2.5 mg total) by mouth daily. Nafziger, Kandee Keen, NP Taking Active   Multiple Vitamins-Minerals (MULTIVITAMIN ADULTS 50+) TABS [id number] No Take 1 tablet by mouth daily. [provider] Taking Active Spouse/Significant Other, Pharmacy Records  polyethylene glycol (MIRALAX / GLYCOLAX) 17 g packet [id number] No Take 17 g by mouth daily.  Patient taking differently: Take 17 g by mouth as needed.   Barnetta Chapel, MD Taking Active             Home Care and Equipment/Supplies: Were Home Health Services Ordered?: NA Any new equipment or medical supplies ordered?: NA  Functional Questionnaire: Do you need assistance with bathing/showering or  dressing?: No Do you need assistance with meal preparation?: No Do you need assistance  with eating?: No Do you have difficulty maintaining continence: No Do you need assistance with getting out of bed/getting out of a chair/moving?: No Do you have difficulty managing or taking your medications?: No  Follow up appointments reviewed: PCP Follow-up appointment confirmed?: No (pt refused to schedule ED follow up within 1 week of discharge) MD Provider Line Number:[phone] Given: Yes Specialist Hospital Follow-up appointment confirmed?: NA Do you need transportation to your follow-up appointment?: No Do you understand care options if your condition(s) worsen?: Yes-patient verbalized understanding  Patient refused to schedule ED follow up within one week of discharge  Abby Takeisha Cianci, CMA  [facility] AWV Team Direct Dial: [phone]

## 2023-12-11 ENCOUNTER — Other Ambulatory Visit: Payer: Self-pay | Admitting: Physician Assistant

## 2023-12-15 DIAGNOSIS — E109 Type 1 diabetes mellitus without complications: Secondary | ICD-10-CM | POA: Diagnosis not present

## 2023-12-24 ENCOUNTER — Encounter: Payer: Self-pay | Admitting: Adult Health

## 2023-12-24 ENCOUNTER — Ambulatory Visit: Payer: Medicare HMO | Admitting: Adult Health

## 2023-12-24 VITALS — BP 160/100 | HR 67 | Temp 97.6°F | Ht 75.0 in | Wt 211.0 lb

## 2023-12-24 DIAGNOSIS — M79673 Pain in unspecified foot: Secondary | ICD-10-CM | POA: Diagnosis not present

## 2023-12-24 DIAGNOSIS — E108 Type 1 diabetes mellitus with unspecified complications: Secondary | ICD-10-CM

## 2023-12-24 NOTE — Progress Notes (Signed)
 Subjective:    Patient ID: Christopher [name], male    DOB: 10-21-1946, 78 y.o.   MRN: [id number]  HPI 78 year old male who  has a past medical history of Diabetes mellitus type II, GERD (gastroesophageal reflux disease), Glaucoma, Hyperlipidemia, and Hypertension.  He presents to the office today for follow-up after being seen in the emergency room roughly 3 weeks ago.  He has a history of diabetes mellitus managed with Dexcom and OmniPod system and was seen in the ER for hyperglycemia.  At this time his wife reported that she changed the Dexcom earlier in the day of arrival and in the afternoon he began reading high.  She was due to change his OmniPod the next day  On evaluation it appeared that his OmniPod had malfunction.  When OmniPod was removed and the catheter was at an acute angle.  He did have labs done in triage showing unremarkable CBC, BMP and hyperglycemia but no acidosis and CKD at baseline.  He and his wife who accompanies him to this appointment today are unsure why they are here since we do not manage his diabetes.  He does report bilateral foot discomfort for an unknown amount of time that is located in his arches.  Reports that he just started noticing this but unsure of how long it has been present.  Discomfort is more annoying than anything.  He denies burning, tingling, loss of sensation.   Review of Systems See HPI   Past Medical History:  Diagnosis Date   Diabetes mellitus type II    GERD (gastroesophageal reflux disease)    Glaucoma    Hyperlipidemia    Hypertension     Social History   Socioeconomic History   Marital status: Married    Spouse name: Not on file   Number of children: Not on file   Years of education: Not on file   Highest education level: Not on file  Occupational History   Not on file  Tobacco Use   Smoking status: Never   Smokeless tobacco: Never  Vaping Use   Vaping status: Never Used  Substance and Sexual Activity   Alcohol  use: No   Drug use: No   Sexual activity: Not on file  Other Topics Concern   Not on file  Social History Narrative   He enjoys playing golf    Social Drivers of Health   Financial Resource Strain: Low Risk  (08/29/2023)   Overall Financial Resource Strain (CARDIA)    Difficulty of Paying Living Expenses: Not hard at all  Food Insecurity: No Food Insecurity (08/29/2023)   Hunger Vital Sign    Worried About Running Out of Food in the Last Year: Never true    Ran Out of Food in the Last Year: Never true  Transportation Needs: No Transportation Needs (08/29/2023)   PRAPARE - Administrator, Civil Service (Medical): No    Lack of Transportation (Non-Medical): No  Physical Activity: Inactive (08/29/2023)   Exercise Vital Sign    Days of Exercise per Week: 0 days    Minutes of Exercise per Session: 0 min  Stress: No Stress Concern Present (08/29/2023)   [name]    Feeling of Stress : Not at all  Social Connections: Socially Integrated (08/29/2023)   Social Connection and Isolation Panel [NHANES]    Frequency of Communication with Friends and Family: More than three times a week  Frequency of Social Gatherings with Friends and Family: More than three times a week    Attends Religious Services: More than 4 times per year    Active Member of [employer] or Organizations: Yes    Attends Engineer, Structural: More than 4 times per year    Marital Status: Married  Catering Manager Violence: Not At Risk (08/29/2023)   Humiliation, Afraid, Rape, and Kick questionnaire    Fear of Current or Ex-Partner: No    Emotionally Abused: No    Physically Abused: No    Sexually Abused: No    Past Surgical History:  Procedure Laterality Date   COLONOSCOPY     HERNIA REPAIR     PATELLA FRACTURE SURGERY  1993    Family History  Problem Relation Age of Onset   Alcohol abuse Father    Diabetes Sister    Diabetes  Brother    Diabetes Brother    Colon cancer Neg Hx    Esophageal cancer Neg Hx    Stomach cancer Neg Hx    Rectal cancer Neg Hx     No Known Allergies  Current Outpatient Medications on File Prior to Visit  Medication Sig Dispense Refill   atorvastatin  (LIPITOR ) 80 MG tablet TAKE 1 TABLET BY MOUTH AT BEDTIME 90 tablet 1   brimonidine  (ALPHAGAN ) 0.15 % ophthalmic solution Place 1 drop into both eyes 2 (two) times daily.     Continuous Blood Gluc Receiver (DEXCOM G7 RECEIVER) DEVI Use to check blood sugar 1 each 0   Continuous Blood Gluc Sensor (DEXCOM G6 SENSOR) MISC Change every 10 days 9 each 2   Continuous Blood Gluc Transmit (DEXCOM G6 TRANSMITTER) MISC Use every 3 months 1 each 2   docusate sodium  (COLACE) 100 MG capsule Take 1 capsule (100 mg total) by mouth 2 (two) times daily. (Patient taking differently: Take 100 mg by mouth as needed.) 10 capsule 0   dorzolamide -timolol  (COSOPT ) 22.3-6.8 MG/ML ophthalmic solution Place 1 drop into both eyes 2 (two) times daily.      insulin  aspart (NOVOLOG ) 100 UNIT/ML injection Up to 50 units daily in insulin  pump 50 mL 1   Insulin  Disposable Pump (OMNIPOD 5 DEXG7G6 PODS GEN 5) MISC SMARTSIG:SUB-Q Every 3 DaysSMARTSIG:SUB-Q Every 3 Days 10 each 5   Insulin  Disposable Pump (OMNIPOD 5 G6 INTRO, GEN 5,) KIT Inject into the skin.     Insulin  Pen Needle 32G X 4 MM MISC Use as instructed to administer insulin  4-5X daily. E10.65 400 each 1   lisinopril  (ZESTRIL ) 2.5 MG tablet Take 1 tablet (2.5 mg total) by mouth daily. 90 tablet 3   Multiple Vitamins-Minerals (MULTIVITAMIN ADULTS 50+) TABS Take 1 tablet by mouth daily.     polyethylene glycol (MIRALAX  / GLYCOLAX ) 17 g packet Take 17 g by mouth daily. (Patient taking differently: Take 17 g by mouth as needed.) 14 each 0   No current facility-administered medications on file prior to visit.    BP (!) 160/100   Pulse 67   Temp 97.6 F (36.4 C) (Oral)   Ht 6' 3 (1.905 m)   Wt 211 lb (95.7 kg)    SpO2 99%   BMI 26.37 kg/m       Objective:   Physical Exam Vitals and nursing note reviewed.  Constitutional:      Appearance: Normal appearance.  Cardiovascular:     Rate and Rhythm: Normal rate and regular rhythm.     Pulses: Normal pulses.  Heart sounds: Normal heart sounds.  Pulmonary:     Effort: Pulmonary effort is normal.     Breath sounds: Normal breath sounds.  Musculoskeletal:       Feet:  Skin:    General: Skin is warm and dry.  Neurological:     General: No focal deficit present.     Mental Status: He is alert and oriented to person, place, and time.  Psychiatric:        Mood and Affect: Mood normal.        Behavior: Behavior normal.        Thought Content: Thought content normal.        Judgment: Judgment normal.        Assessment & Plan:  1. Foot arch pain, unspecified laterality (Primary) -His discomfort seems to be related to arch support or lack of in his tennis shoes.  He was encouraged to get some insoles with arch support which should fix his problem  2. Type 1 diabetes mellitus with unspecified complications (HCC) - Follow up with endocrinology as directed - He has a follow up appointment scheduled   Darleene Shape, NP   Time spent with patient today was 31 minutes which consisted of chart review, discussing Dm Type 1 and bilateral foot pain work up, treatment answering questions and documentation.

## 2023-12-24 NOTE — Patient Instructions (Signed)
 Look at power step arch insert   Follow up with endocrinology as directed

## 2024-01-19 ENCOUNTER — Other Ambulatory Visit: Payer: Self-pay | Admitting: Endocrinology

## 2024-02-09 ENCOUNTER — Encounter: Payer: Self-pay | Admitting: Endocrinology

## 2024-02-09 ENCOUNTER — Ambulatory Visit: Payer: Medicare HMO | Admitting: Endocrinology

## 2024-02-09 VITALS — BP 132/82 | HR 71 | Resp 20 | Ht 75.0 in | Wt 212.8 lb

## 2024-02-09 DIAGNOSIS — E109 Type 1 diabetes mellitus without complications: Secondary | ICD-10-CM

## 2024-02-09 LAB — POCT GLYCOSYLATED HEMOGLOBIN (HGB A1C): Hemoglobin A1C: 6.7 % — AB (ref 4.0–5.6)

## 2024-02-09 NOTE — Progress Notes (Signed)
 Outpatient Endocrinology Note [place] Addisynn Vassell, MD  02/09/24  Patient's Name: Christopher [name]    DOB: 1946-03-18    MRN: [id number]                                                    REASON OF VISIT: Follow up of for type 1 diabetes mellitus  PCP: Shirline Frees, NP  HISTORY OF PRESENT ILLNESS:   Christopher [name] is a 78 y.o. old male with past medical history listed below, is here for follow up of type 1 diabetes mellitus.   Pertinent Diabetes History: Patient was diagnosed with type 1 diabetes mellitus in 2010, insulin therapy was started in 2013.  He was previously on Trulicity and Victoza as well which were stopped later.  He had tried Levemir and NPH insulin in the past.  History of DKA or diabetes related hospitalizations: yes, at least 2 times.  Previous diabetes education: Yes   Chronic Diabetes Complications : Retinopathy: no. Last ophthalmology exam was done on annually. Nephropathy: no, on lisinopril. Peripheral neuropathy: no Coronary artery disease: no Stroke: no  Relevant comorbidities and cardiovascular risk factors: Obesity: no Body mass index is 26.6 kg/m.  Hypertension: yes Hyperlipidemia. yes  Current / Home Diabetic regimen includes:  OmniPod 5 with Dexcom G6, uses NovoLog U100.  Insulin Pump setting:  Basal ( 25.2 units/day) MN- 1.05u/hour Bolus  CHO Ratio (1unit:CHO) MN- 1:1  He uses fixed carb count  for 12 breakfast, 2 -5 for lunch and 2 -5 for supper.  Correction/Sensitivity: MN- 1:35  Target: 130 and 120  Active insulin time: 4 hours  Prior diabetic medications: Trulicity, Victoza, Levemir, NPH.  CONTINUOUS GLUCOSE MONITORING SYSTEM (CGMS) / INSULIN PUMP INTERPRETATION:                         OmniPod 5 Pump & Dexcom G6 Sensor Download (Reviewed and summarized below.)   Glucose Management Indicator: 7.4%  Average total daily insulin: 48 units, Basal: 58%, Bolus: 42%       Trends:  Most of the blood sugar acceptable.  He has  frequent hyperglycemia especially with supper with blood sugar up to 250 range, related to not enough meal bolus and sometime late meal bolus.  No hypoglycemia.  Hypoglycemia: Patient has no hypoglycemic episodes. Patient has hypoglycemia awareness.    Factors modifying glucose control: 1.  Diabetic diet assessment: 3 meals a day breakfast (oatmeal usually), lunch and dinner however sometimes he eats cake or ice cream.   2.  Staying active or exercising: Playing golf and active at home.  3.  Medication compliance: compliant all of the time.  Interval history  Pump and CGM data as reviewed above.  Hemoglobin A1c 6.7% today.  He denies any numbness and tingling of the feet.  No other complaints today.  Patient is accompanied by wife in the clinic today.  REVIEW OF SYSTEMS As per history of present illness.   PAST MEDICAL HISTORY: Past Medical History:  Diagnosis Date   Diabetes mellitus type II    GERD (gastroesophageal reflux disease)    Glaucoma    Hyperlipidemia    Hypertension     PAST SURGICAL HISTORY: Past Surgical History:  Procedure Laterality Date   COLONOSCOPY     HERNIA REPAIR     PATELLA FRACTURE  SURGERY  1993    ALLERGIES: No Known Allergies  FAMILY HISTORY:  Family History  Problem Relation Age of Onset   Alcohol abuse Father    Diabetes Sister    Diabetes Brother    Diabetes Brother    Colon cancer Neg Hx    Esophageal cancer Neg Hx    Stomach cancer Neg Hx    Rectal cancer Neg Hx     SOCIAL HISTORY: Social History   Socioeconomic History   Marital status: Married    Spouse name: Not on file   Number of children: Not on file   Years of education: Not on file   Highest education level: Not on file  Occupational History   Not on file  Tobacco Use   Smoking status: Never   Smokeless tobacco: Never  Vaping Use   Vaping status: Never Used  Substance and Sexual Activity   Alcohol use: No   Drug use: No   Sexual activity: Not on file   Other Topics Concern   Not on file  Social History Narrative   He enjoys playing golf    Social Drivers of Health   Financial Resource Strain: Low Risk  (08/29/2023)   Overall Financial Resource Strain (CARDIA)    Difficulty of Paying Living Expenses: Not hard at all  Food Insecurity: No Food Insecurity (08/29/2023)   Hunger Vital Sign    Worried About Running Out of Food in the Last Year: Never true    Ran Out of Food in the Last Year: Never true  Transportation Needs: No Transportation Needs (08/29/2023)   PRAPARE - Administrator, Civil Service (Medical): No    Lack of Transportation (Non-Medical): No  Physical Activity: Inactive (08/29/2023)   Exercise Vital Sign    Days of Exercise per Week: 0 days    Minutes of Exercise per Session: 0 min  Stress: No Stress Concern Present (08/29/2023)   [name]    Feeling of Stress : Not at all  Social Connections: Socially Integrated (08/29/2023)   Social Connection and Isolation Panel [NHANES]    Frequency of Communication with Friends and Family: More than three times a week    Frequency of Social Gatherings with Friends and Family: More than three times a week    Attends Religious Services: More than 4 times per year    Active Member of [employer] or Organizations: Yes    Attends Engineer, structural: More than 4 times per year    Marital Status: Married    MEDICATIONS:  Current Outpatient Medications  Medication Sig Dispense Refill   atorvastatin (LIPITOR) 80 MG tablet TAKE 1 TABLET BY MOUTH AT BEDTIME 90 tablet 1   brimonidine (ALPHAGAN) 0.15 % ophthalmic solution Place 1 drop into both eyes 2 (two) times daily.     Continuous Blood Gluc Receiver (DEXCOM G7 RECEIVER) DEVI Use to check blood sugar 1 each 0   Continuous Blood Gluc Sensor (DEXCOM G6 SENSOR) MISC Change every 10 days 9 each 2   Continuous Blood Gluc Transmit (DEXCOM G6 TRANSMITTER) MISC  Use every 3 months 1 each 2   docusate sodium (COLACE) 100 MG capsule Take 1 capsule (100 mg total) by mouth 2 (two) times daily. (Patient taking differently: Take 100 mg by mouth as needed.) 10 capsule 0   dorzolamide-timolol (COSOPT) 22.3-6.8 MG/ML ophthalmic solution Place 1 drop into both eyes 2 (two) times daily.  Insulin Disposable Pump (OMNIPOD 5 DEXG7G6 PODS GEN 5) MISC SMARTSIG:SUB-Q Every 3 DaysSMARTSIG:SUB-Q Every 3 Days 10 each 5   Insulin Disposable Pump (OMNIPOD 5 G6 INTRO, GEN 5,) KIT Inject into the skin.     Insulin Pen Needle 32G X 4 MM MISC Use as instructed to administer insulin 4-5X daily. E10.65 400 each 1   lisinopril (ZESTRIL) 2.5 MG tablet Take 1 tablet (2.5 mg total) by mouth daily. 90 tablet 3   Multiple Vitamins-Minerals (MULTIVITAMIN ADULTS 50+) TABS Take 1 tablet by mouth daily.     NOVOLOG 100 UNIT/ML injection INJECT UP TO 50 UNITS SUBCUTANEOUSLY ONCE DAILY IN  INSULIN  PUMP 50 mL 3   polyethylene glycol (MIRALAX / GLYCOLAX) 17 g packet Take 17 g by mouth daily. (Patient taking differently: Take 17 g by mouth as needed.) 14 each 0   No current facility-administered medications for this visit.    PHYSICAL EXAM: Vitals:   02/09/24 0811  BP: 132/82  Pulse: 71  Resp: 20  SpO2: 99%  Weight: 212 lb 12.8 oz (96.5 kg)  Height: 6\' 3"  (1.905 m)    Body mass index is 26.6 kg/m.  Wt Readings from Last 3 Encounters:  02/09/24 212 lb 12.8 oz (96.5 kg)  12/24/23 211 lb (95.7 kg)  11/07/23 212 lb 12.8 oz (96.5 kg)    General: Well developed, well nourished male in no apparent distress.  HEENT: AT/Christopher [name], no external lesions.  Eyes: Conjunctiva clear and no icterus. Neck: Neck supple  Lungs: Respirations not labored Neurologic: Alert, oriented, normal speech Extremities / Skin: Dry.  Psychiatric: Does not appear depressed or anxious  Diabetic Foot Exam - Simple   Simple Foot Form Diabetic Foot exam was performed with the following findings: Yes 02/09/2024   8:33 AM  Visual Inspection See comments: Yes Sensation Testing See comments: Yes Pulse Check See comments: Yes Comments DP palpable bilaterally. Hammer toes deformity, dystrophic nails. No ulcer.  Monofilament sensation significantly diminished towards bilateral toes, intact towards heel.       LABS Reviewed Lab Results  Component Value Date   HGBA1C 6.7 (A) 02/09/2024   HGBA1C 6.6 (A) 11/07/2023   HGBA1C 6.7 (A) 08/06/2023   Lab Results  Component Value Date   FRUCTOSAMINE 427 (H) 10/04/2022   FRUCTOSAMINE 479 (H) 09/16/2016   Lab Results  Component Value Date   CHOL 214 (H) 03/20/2023   HDL 51.20 03/20/2023   LDLCALC 149 (H) 03/20/2023   LDLDIRECT 135.3 06/17/2013   TRIG 67.0 03/20/2023   CHOLHDL 4 03/20/2023   Lab Results  Component Value Date   MICRALBCREAT 0.3 08/06/2023   MICRALBCREAT 0.9 05/20/2022   Lab Results  Component Value Date   CREATININE 1.56 (H) 12/02/2023   Lab Results  Component Value Date   GFR 47.86 (L) 03/20/2023    ASSESSMENT / PLAN  1. Type 1 diabetes mellitus without complication (HCC)     Diabetes Mellitus type 1, complicated by no known complications. - Diabetic status / severity: Controlled.  Lab Results  Component Value Date   HGBA1C 6.7 (A) 02/09/2024    - Hemoglobin A1c goal <7%   - Medications:  Insulin pump setting changed as follows: OmniPod 5 insulin Pump setting: Using NovoLog U100.  With Dexcom G6.  Insulin Pump setting:  Basal MN- 1.05u/hour Bolus  CHO Ratio (1unit:CHO) MN- 1:1  He uses fixed carb count 12 for breakfast, 4-6 for lunch and 4-6 for supper.  He is mainly using for for lunch and  supper advised to use up to 6 carb for lunch and supper can adjust based on meal size and type.  Advised to take 1-2 carb for sugary snacks like ice cream/cracker.  Advised to increase the carb count for lunch and supper.   Correction/Sensitivity: MN- 1:35  Target: 130 and 120  Active insulin time: 4  hours  - Home glucose testing: continue CGM and check blood glucose as needed.  - Discussed/ Gave Hypoglycemia treatment plan.  # Consult : not required at this time.   # Annual urine for microalbuminuria/ creatinine ratio, no microalbuminuria currently, continue ACE/ARB /lisinopril.   Last  Lab Results  Component Value Date   MICRALBCREAT 0.3 08/06/2023    # Foot check nightly.  # Annual dilated diabetic eye exams.   - Diet: Eat reasonable portion sizes to promote a healthy weight - Life style / activity / exercise: Discussed.  2. Blood pressure  -  BP Readings from Last 1 Encounters:  02/09/24 132/82    - Control is in target.  - No change in current plans.  3. Lipid status / Hyperlipidemia - Last  Lab Results  Component Value Date   LDLCALC 149 (H) 03/20/2023   - Continue atorvastatin 80 mg daily.  Managed by primary care provider.  Diagnoses and all orders for this visit:  Type 1 diabetes mellitus without complication (HCC) -     POCT glycosylated hemoglobin (Hb A1C)     DISPOSITION Follow up in clinic in 3 months suggested.   All questions answered and patient verbalized understanding of the plan.  [place] Amalio Loe, MD [facility] Endocrinology [facility] [address] [place], [address] [place], [place] [address] Phone # [phone]  At least part of this note was generated using voice recognition software. Inadvertent word errors may have occurred, which were not recognized during the proofreading process.

## 2024-02-25 ENCOUNTER — Telehealth: Payer: Self-pay | Admitting: Endocrinology

## 2024-02-25 ENCOUNTER — Telehealth: Payer: Self-pay

## 2024-02-25 NOTE — Telephone Encounter (Signed)
 Patient wife called and said that patients blood has been going back and forth, she said the meter is reading 40 right now but that he's fine and he ate a little bit ago so she thinks it might be off and wanted to know if they can come in to have someone check it. Call back is [phone]

## 2024-02-25 NOTE — Telephone Encounter (Signed)
 Called patient spouse back, however no answer, VM left.

## 2024-02-26 ENCOUNTER — Telehealth: Payer: Self-pay | Admitting: Adult Health

## 2024-02-26 ENCOUNTER — Encounter: Attending: Endocrinology | Admitting: Dietician

## 2024-02-26 DIAGNOSIS — E108 Type 1 diabetes mellitus with unspecified complications: Secondary | ICD-10-CM

## 2024-02-26 DIAGNOSIS — N1831 Chronic kidney disease, stage 3a: Secondary | ICD-10-CM

## 2024-02-26 NOTE — Telephone Encounter (Signed)
 Referral placed. Pt aware.

## 2024-02-26 NOTE — Telephone Encounter (Signed)
 Ok for referral?

## 2024-02-26 NOTE — Telephone Encounter (Signed)
 Copied from CRM [id number]. Topic: Referral - Request for Referral >> Feb 26, 2024  2:00 PM Sim Boast F wrote: Did the patient discuss referral with their provider in the last year? Yes (If No - schedule appointment) (If Yes - send message)  Appointment offered? No  Type of order/referral and detailed reason for visit: Updated Nutrition and Diabetic Education referral for diabetes mellitus with hyperglycemia since previous referral expired 12/12/2023. Patient being seen today 02/26/24.   Preference of office, provider, location: [facility]   If referral order, have you been seen by this specialty before?  (If Yes, this issue or another issue? When? Where?  Can we respond through MyChart? No

## 2024-02-26 NOTE — Progress Notes (Signed)
 Patient is here today with his wife.  I last saw them in 2023 and know them from the support group.  They have also been followed by another diabetes educator in 2024. They arrived today but his appointment was tomorrow.  Patient's wife stated that she needs to learn how to work patient's new blood glucose monitor and lancing device.  The Dexcom sometimes does not connect and this has happened when his sensor reading was 40 and his wife understands the importance of knowing blood glucose monitoring.  Instructed patient and wife how to test his blood glucose using his meter.  Blood glucose was 167.  Type 1 Diabetes email has been going to [place].  He no longer looks at his email.  Have added his wife to the email list per her request.  They are to call for further questions.  Oran Rein, RD, LDN, CDCES, DipACLM

## 2024-02-27 ENCOUNTER — Ambulatory Visit: Admitting: Dietician

## 2024-03-07 ENCOUNTER — Other Ambulatory Visit: Payer: Self-pay

## 2024-03-07 ENCOUNTER — Emergency Department (HOSPITAL_COMMUNITY)
Admission: EM | Admit: 2024-03-07 | Discharge: 2024-03-07 | Disposition: A | Discharge status: Home / Self Care | Attending: Emergency Medicine | Admitting: Emergency Medicine

## 2024-03-07 ENCOUNTER — Encounter (HOSPITAL_COMMUNITY): Payer: Self-pay | Admitting: Emergency Medicine

## 2024-03-07 DIAGNOSIS — R404 Transient alteration of awareness: Secondary | ICD-10-CM

## 2024-03-07 DIAGNOSIS — Z79899 Other long term (current) drug therapy: Secondary | ICD-10-CM | POA: Insufficient documentation

## 2024-03-07 DIAGNOSIS — Z794 Long term (current) use of insulin: Secondary | ICD-10-CM | POA: Insufficient documentation

## 2024-03-07 DIAGNOSIS — E162 Hypoglycemia, unspecified: Secondary | ICD-10-CM | POA: Diagnosis present

## 2024-03-07 DIAGNOSIS — E11649 Type 2 diabetes mellitus with hypoglycemia without coma: Secondary | ICD-10-CM | POA: Diagnosis not present

## 2024-03-07 DIAGNOSIS — I10 Essential (primary) hypertension: Secondary | ICD-10-CM | POA: Insufficient documentation

## 2024-03-07 LAB — CBG MONITORING, ED: Glucose-Capillary: 158 mg/dL — ABNORMAL HIGH (ref 70–99)

## 2024-03-07 NOTE — Discharge Instructions (Signed)
 You were seen in the ER for hypoglycemia.  Your blood sugar now is 158. I recommend having glucose tablets or candy around in the house for times when you feel your sugar is low. Juice can be good too and more complex carbohydrates work for a longer period of time, but simple sugars may increase your sugar faster in the interim.  Continue to monitor how you're doing and return to the ED for any new or worsening symptoms.

## 2024-03-07 NOTE — ED Triage Notes (Addendum)
 Pt here POV with wife, states that he ate breakfast this morning and took 12 units of insulin. Wife states that he did not feel like eating lunch and she noticed his sugar dropped to 65. States that he drank orange juice and ate a couple cookies and it started to come up. Per wife pt started to become confused and not acting appropriately prior to eating and briefly after eating. Pt now states he feels back to normal, wife reports that he is now back to baseline. Hx of HTN, states that he is not sure if he took his medications this morning.

## 2024-03-07 NOTE — ED Provider Notes (Signed)
 [facility] EMERGENCY DEPARTMENT AT [facility] Provider Note   CSN: [id number] Arrival date & time: 03/07/24  1543     History  Chief Complaint  Patient presents with   Hypoglycemia    Christopher [name] is a 78 y.o. male with history of GERD, glaucoma, hyperlipidemia, hypertension, venous insufficiency, TIA, insulin-dependent type 2 diabetes, who presents to the emergency department with wife with concern for hypoglycemia and altered mental status.  Per wife, patient ate breakfast this morning and then took 12 units of insulin as he is prescribed.  Patient did not end up eating lunch, and his wife was alerted that his blood sugar had dropped down to 65.  She gave him some orange juice and a handful of cookies, and his blood sugar did start to come up.  She got concerned because he seemed very confused and incoherent.  Upon EMS arrival he started to seem more like himself.  Now both patient and his wife report he is back to baseline.  They have no other concerns.   Hypoglycemia Associated symptoms: speech difficulty        Home Medications Prior to Admission medications   Medication Sig Start Date End Date Taking? Authorizing Provider  atorvastatin (LIPITOR) 80 MG tablet TAKE 1 TABLET BY MOUTH AT BEDTIME 12/11/23   Swinyer, Zachary George, NP  brimonidine (ALPHAGAN) 0.15 % ophthalmic solution Place 1 drop into both eyes 2 (two) times daily.    [provider]  Continuous Blood Gluc Receiver (DEXCOM G7 RECEIVER) DEVI Use to check blood sugar 11/19/22   Reather Littler, MD  Continuous Blood Gluc Sensor (DEXCOM G6 SENSOR) MISC Change every 10 days 11/19/22   Reather Littler, MD  Continuous Blood Gluc Transmit (DEXCOM G6 TRANSMITTER) MISC Use every 3 months 11/19/22   Reather Littler, MD  docusate sodium (COLACE) 100 MG capsule Take 1 capsule (100 mg total) by mouth 2 (two) times daily. Patient taking differently: Take 100 mg by mouth as needed. 12/10/22   Barnetta Chapel, MD   dorzolamide-timolol (COSOPT) 22.3-6.8 MG/ML ophthalmic solution Place 1 drop into both eyes 2 (two) times daily.  03/03/11   [provider]  Insulin Disposable Pump (OMNIPOD 5 DEXG7G6 PODS GEN 5) MISC SMARTSIG:SUB-Q Every 3 DaysSMARTSIG:SUB-Q Every 3 Days 11/27/23   Thapa, [place], MD  Insulin Disposable Pump (OMNIPOD 5 G6 INTRO, GEN 5,) KIT Inject into the skin. 12/10/22   [provider]  Insulin Pen Needle 32G X 4 MM MISC Use as instructed to administer insulin 4-5X daily. E10.65 10/29/22   Reather Littler, MD  lisinopril (ZESTRIL) 2.5 MG tablet Take 1 tablet (2.5 mg total) by mouth daily. 03/19/23   Nafziger, Kandee Keen, NP  Multiple Vitamins-Minerals (MULTIVITAMIN ADULTS 50+) TABS Take 1 tablet by mouth daily.    [provider]  NOVOLOG 100 UNIT/ML injection INJECT UP TO 50 UNITS SUBCUTANEOUSLY ONCE DAILY IN  INSULIN  PUMP 01/19/24   Thapa, [place], MD  polyethylene glycol (MIRALAX / GLYCOLAX) 17 g packet Take 17 g by mouth daily. Patient taking differently: Take 17 g by mouth as needed. 12/11/22   Barnetta Chapel, MD      Allergies    Patient has no known allergies.    Review of Systems   Review of Systems  Neurological:  Positive for speech difficulty.  Psychiatric/Behavioral:  Positive for confusion.   All other systems reviewed and are negative.   Physical Exam Updated Vital Signs BP (!) 173/116 (BP Location: Right Arm)  Pulse 85   Temp 97.8 F (36.6 C)   Resp (!) 24   Ht 6\' 3"  (1.905 m)   Wt 96.2 kg   SpO2 100%   BMI 26.50 kg/m  Physical Exam Vitals and nursing note reviewed.  Constitutional:      Appearance: Normal appearance.  HENT:     Head: Normocephalic and atraumatic.  Eyes:     Conjunctiva/sclera: Conjunctivae normal.  Cardiovascular:     Rate and Rhythm: Normal rate and regular rhythm.  Pulmonary:     Effort: Pulmonary effort is normal. No respiratory distress.     Breath sounds: Normal breath sounds.  Abdominal:     General: There is  no distension.     Palpations: Abdomen is soft.     Tenderness: There is no abdominal tenderness.  Skin:    General: Skin is warm and dry.  Neurological:     General: No focal deficit present.     Mental Status: He is alert.     Comments: Neuro: Speech is clear, able to follow commands. CN III-XII intact grossly intact. PERRLA. EOMI. Sensation intact throughout. Str 5/5 all extremities.     ED Results / Procedures / Treatments   Labs (all labs ordered are listed, but only abnormal results are displayed) Labs Reviewed  CBG MONITORING, ED - Abnormal; Notable for the following components:      Result Value   Glucose-Capillary 158 (*)    All other components within normal limits    EKG None  Radiology No results found.  Procedures Procedures    Medications Ordered in ED Medications - No data to display  ED Course/ Medical Decision Making/ A&P                                 Medical Decision Making  This patient is a 78 y.o. male who presents to the ED for concern of episode of hypoglycemia and altered mental status.   Differential diagnoses prior to evaluation: Drug-related, hypoxia, hyper/hypoglycemia, encephalopathy, sepsis, DKA/HHS, brain lesion, CVA, seizure, environmental, psychiatric  Past Medical History / Social History / Additional history: Chart reviewed. Pertinent results include: GERD, glaucoma, hyperlipidemia, hypertension, venous insufficiency, TIA, insulin-dependent type 2 diabetes  Physical Exam: Physical exam performed. The pertinent findings include: Hypertensive, otherwise normal vital signs.  No acute distress.  Heart regular rate and rhythm, lung sounds clear.  Normal neurologic exam.  Labs: CBG 158   Disposition: After consideration of the diagnostic results and the patients response to treatment, I feel that emergency department workup does not suggest an emergent condition requiring admission or immediate intervention beyond what has been  performed at this time. The plan is: Discharge to home.  Had thorough discussion with the patient and his wife, and it seems like his symptoms were associated with an episode of hypoglycemia, after taking insulin without proper p.o. intake.  Sugar has normalized, and patient feels like himself.  He has a reassuring neurologic exam, and a very low concern for CVA/TIA as a cause of his symptoms.  I offered further lab evaluation to include potential imaging, and patient and his wife feel comfortable with going home.  I think this is reasonable.  They will monitor his sugar closely and notify his PCP.Marland Kitchen The patient is safe for discharge and has been instructed to return immediately for worsening symptoms, change in symptoms or any other concerns.  Final Clinical Impression(s) / ED Diagnoses  Final diagnoses:  Hypoglycemia  Altered awareness, transient    Rx / DC Orders ED Discharge Orders     None      Portions of this report may have been transcribed using voice recognition software. Every effort was made to ensure accuracy; however, inadvertent computerized transcription errors may be present.    Jeanella Flattery 03/07/24 1706    Maia Plan, MD 03/10/24 1358

## 2024-03-08 ENCOUNTER — Other Ambulatory Visit: Payer: Self-pay | Admitting: Adult Health

## 2024-03-08 DIAGNOSIS — I152 Hypertension secondary to endocrine disorders: Secondary | ICD-10-CM

## 2024-03-08 DIAGNOSIS — E108 Type 1 diabetes mellitus with unspecified complications: Secondary | ICD-10-CM

## 2024-03-08 DIAGNOSIS — E1159 Type 2 diabetes mellitus with other circulatory complications: Secondary | ICD-10-CM

## 2024-03-14 DIAGNOSIS — E109 Type 1 diabetes mellitus without complications: Secondary | ICD-10-CM | POA: Diagnosis not present

## 2024-04-05 IMAGING — CT CT HEAD W/O CM
4 series · 16 of 47 positions shown, 18 images · non-contrast
Comparison: 05/27/2018

CLINICAL DATA: Delirium



[Series 2: head wo · axial · 0.49mm/px · z∈[-116,+9]mm · 7 of 35 slices shown, 9 images]
[im 5/35  brain]
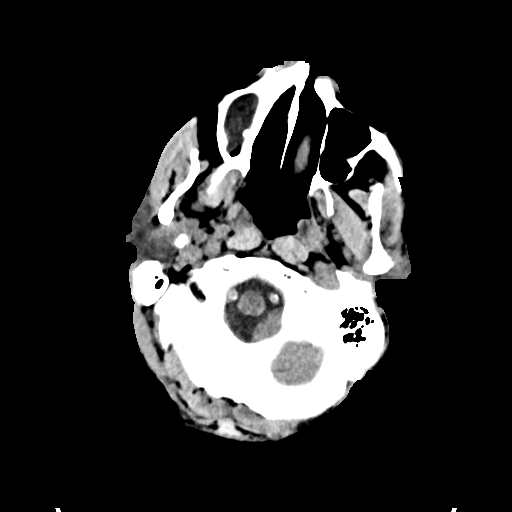
[im 5/35  bone]
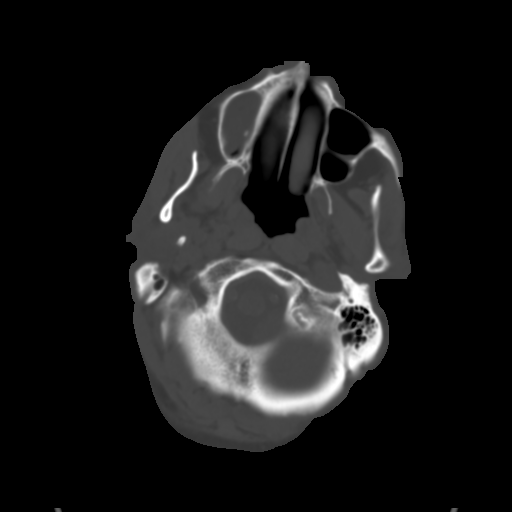
[im 9/35  brain]
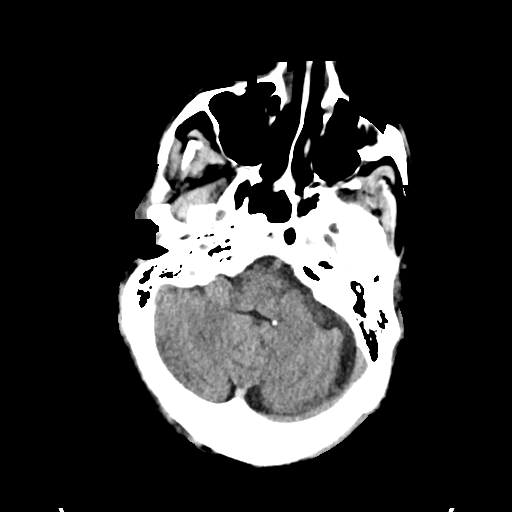
[im 13/35  brain]
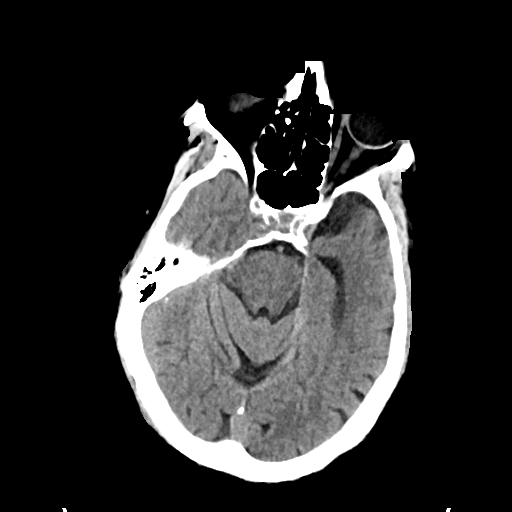
[im 18/35  brain]
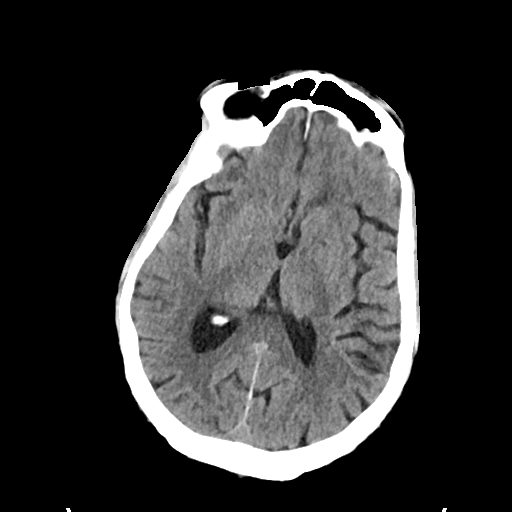
[im 22/35  brain]
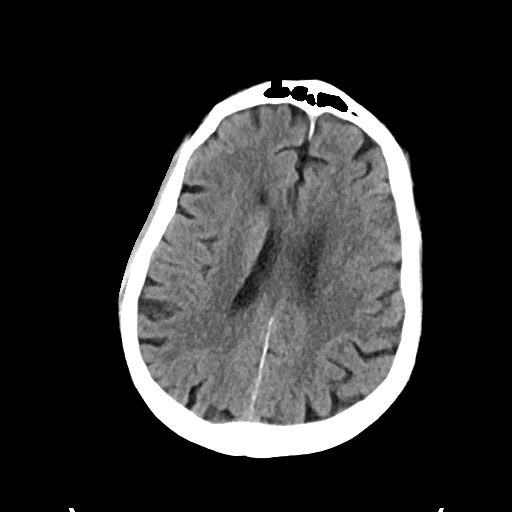
[im 22/35  bone]
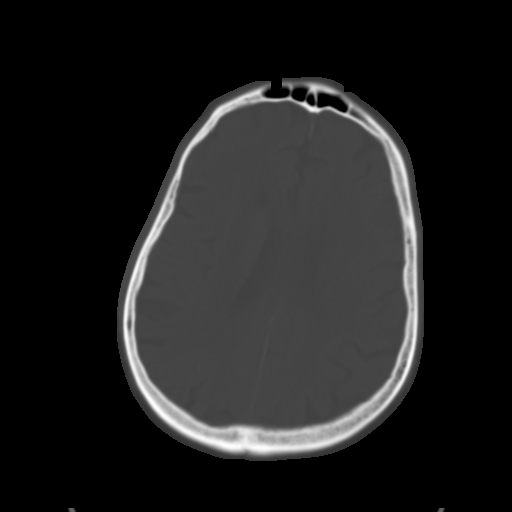
[im 26/35  brain]
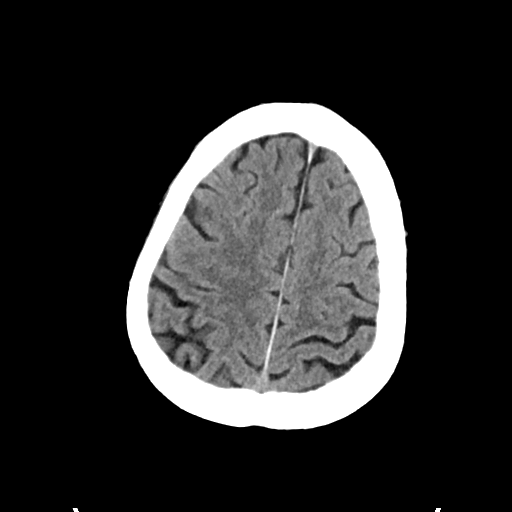
[im 30/35  brain]
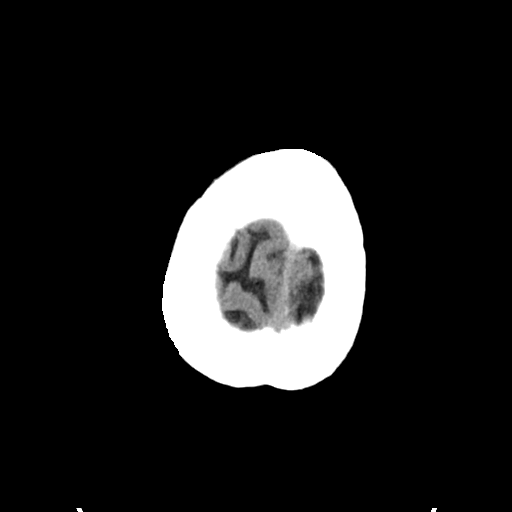

[Series 3: head bone · axial · 0.49mm/px · z∈[-120,-86]mm · 3 of 87 slices shown]
[im 9/87  bone]
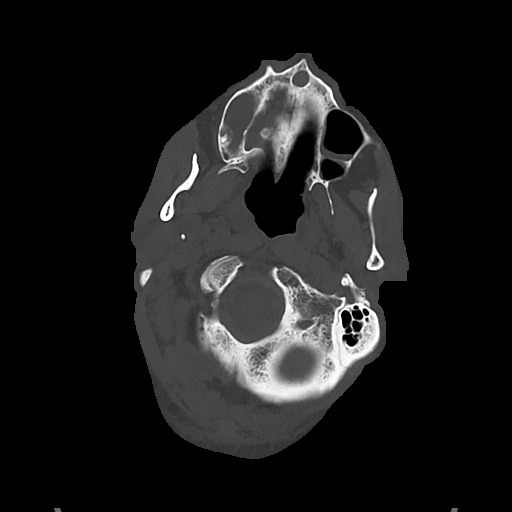
[im 18/87  bone]
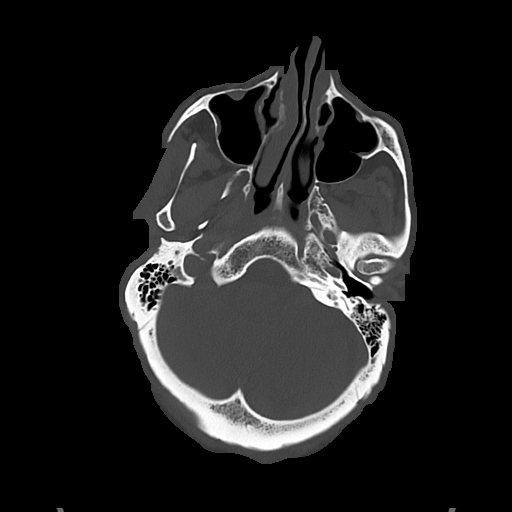
[im 26/87  bone]
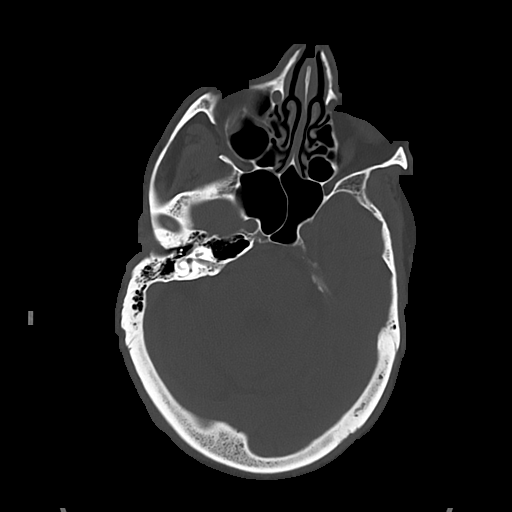

[Series 4: cor soft · coronal · 0.35mm/px · 3 of 79 slices shown]
[im 27/79  brain]
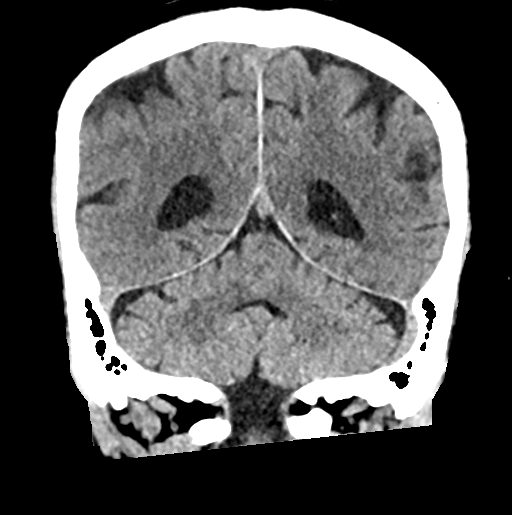
[im 35/79  brain]
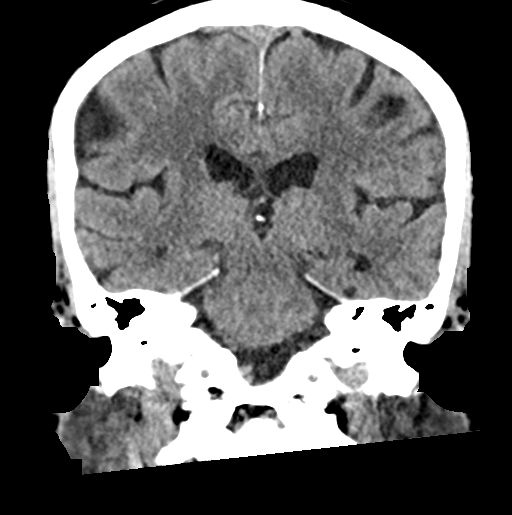
[im 44/79  brain]
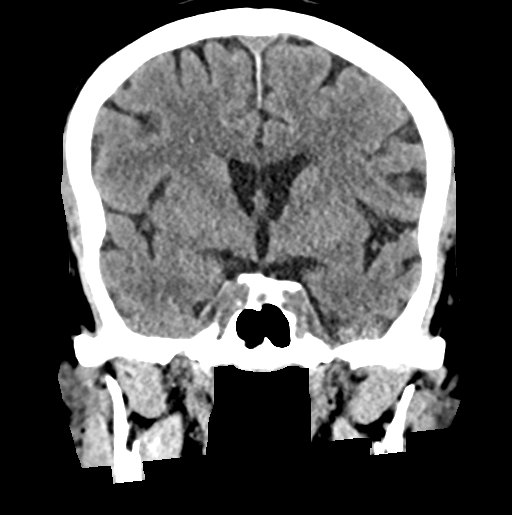

[Series 5: sag soft · sagittal · 0.35mm/px · 3 of 60 slices shown]
[im 20/60  brain]
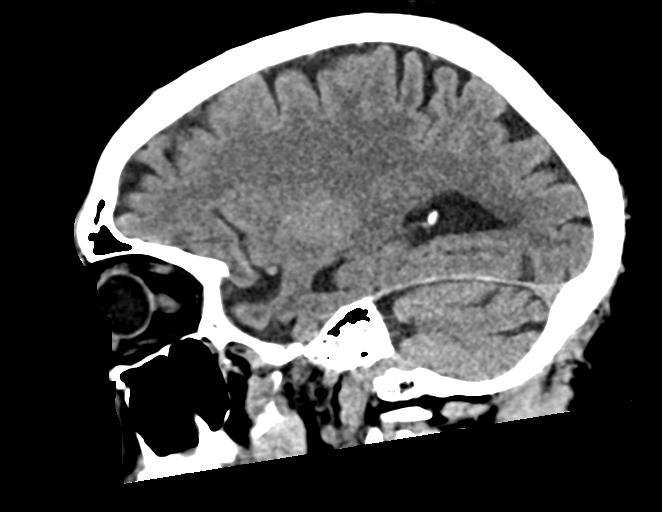
[im 30/60  brain]
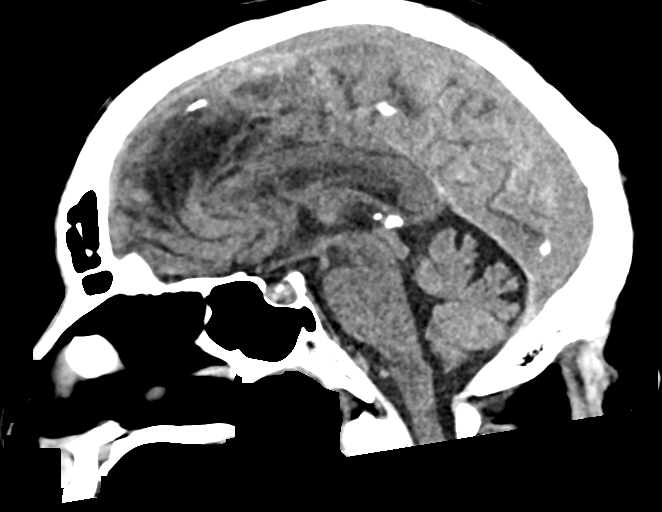
[im 40/60  brain]
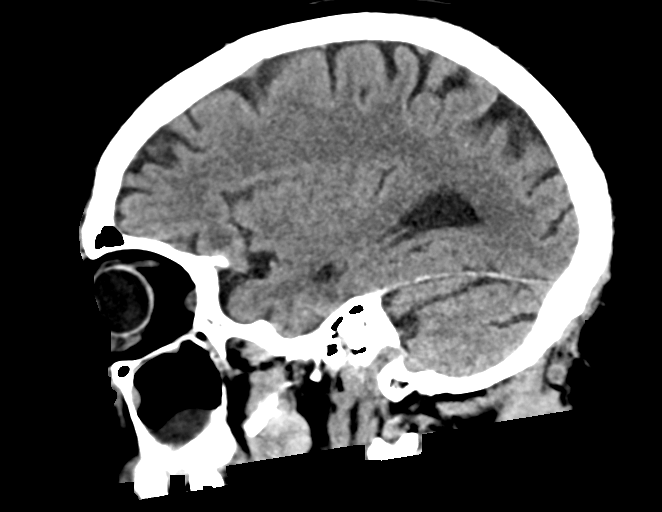

[16 of 47 positions shown; findings below may reference images not displayed]

FINDINGS: Brain: There is no evidence for acute hemorrhage, hydrocephalus,
mass lesion, or abnormal extra-axial fluid collection. No definite
CT evidence for acute infarction. Chronic tiny lacunar infarct left
basal ganglia.

Vascular: No hyperdense vessel or unexpected calcification.

Skull: No evidence for fracture. No worrisome lytic or sclerotic
lesion.

Sinuses/Orbits: The visualized paranasal sinuses and mastoid air
cells are clear. Visualized portions of the globes and intraorbital
fat are unremarkable.

Other: None.
IMPRESSION: 1. No acute intracranial abnormality.

## 2024-04-06 IMAGING — DX DG CHEST 1V PORT
1 series · 1 of 1 positions shown · non-contrast
Comparison: Prior chest x-ray 03/21/2022

CLINICAL DATA: 75-year-old female with abnormal respiration

EXAM:
PORTABLE CHEST 1 VIEW

[chest]
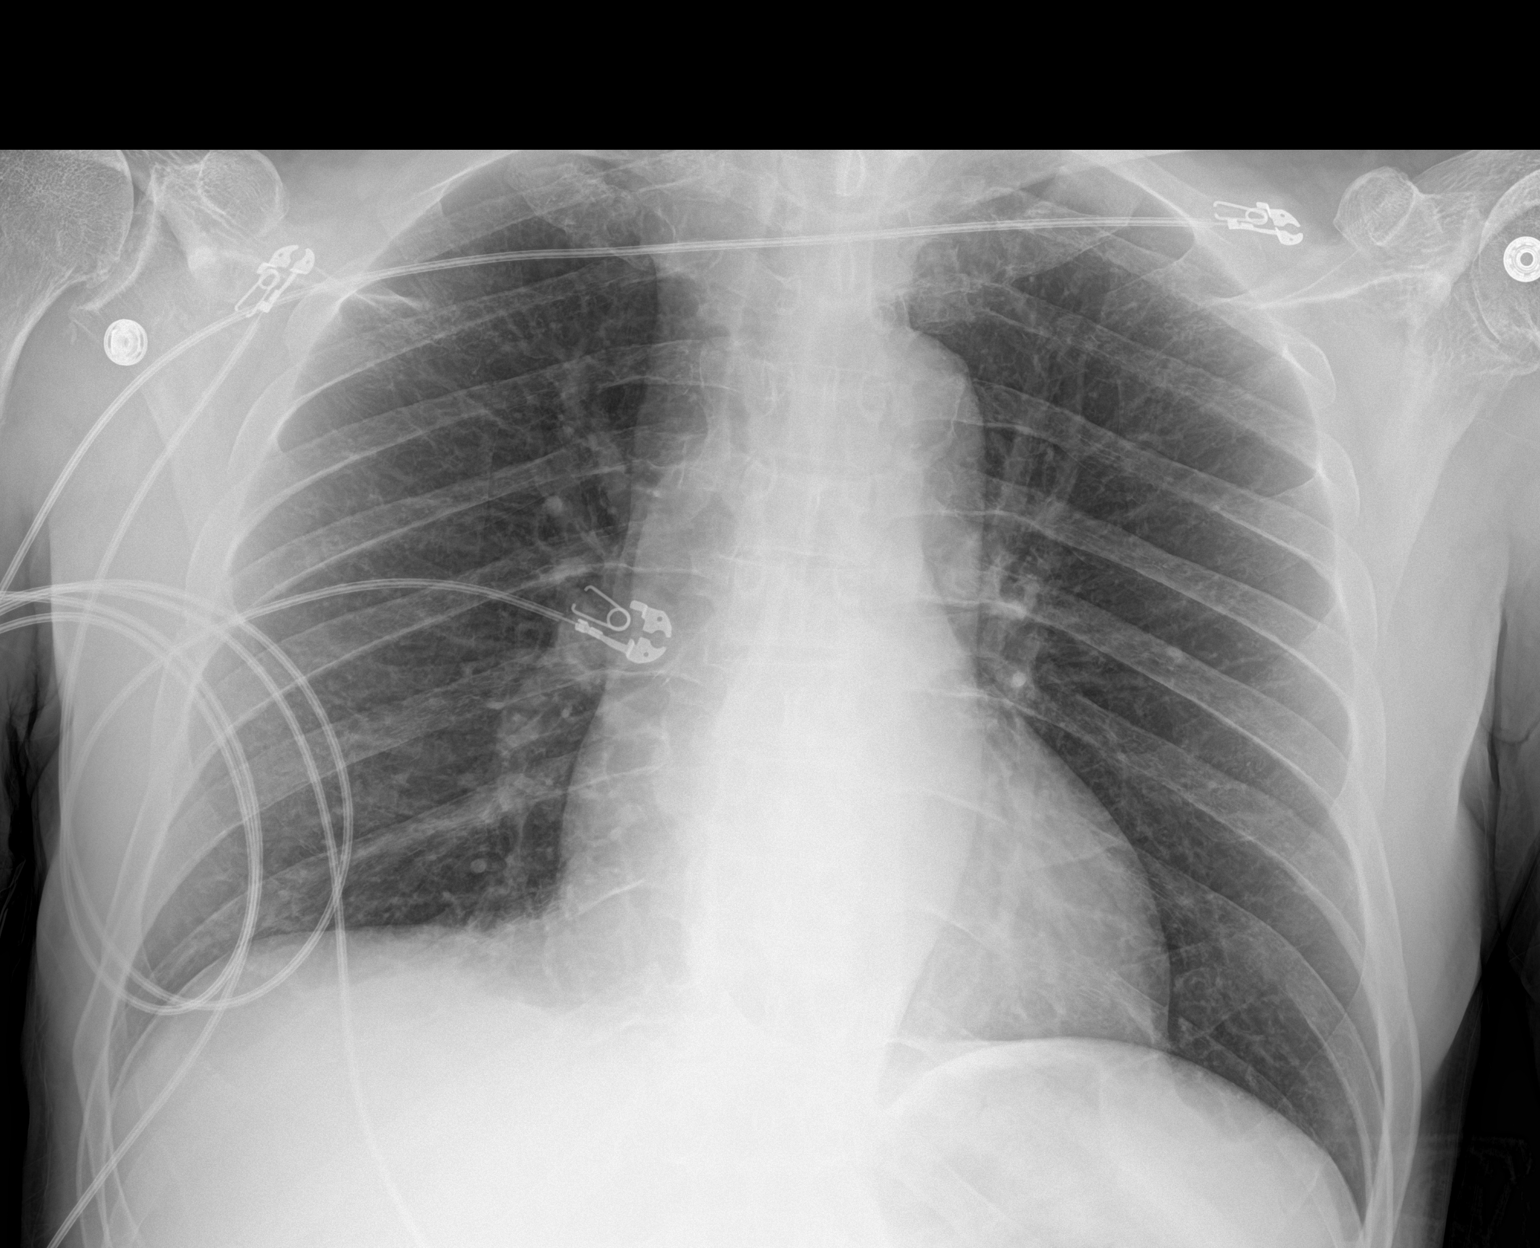

[1 of 1 positions shown; findings below may reference images not displayed]

FINDINGS: The lungs are clear and negative for focal airspace consolidation,
pulmonary edema or suspicious pulmonary nodule. No pleural effusion
or pneumothorax. Cardiac and mediastinal contours are within normal
limits. No acute fracture or lytic or blastic osseous lesions. The
visualized upper abdominal bowel gas pattern is unremarkable.
IMPRESSION: No active disease.

## 2024-05-06 ENCOUNTER — Other Ambulatory Visit: Payer: Self-pay | Admitting: Endocrinology

## 2024-05-06 ENCOUNTER — Ambulatory Visit: Payer: Medicare HMO | Admitting: Endocrinology

## 2024-05-06 DIAGNOSIS — E109 Type 1 diabetes mellitus without complications: Secondary | ICD-10-CM

## 2024-05-06 NOTE — Telephone Encounter (Signed)
 Refill request complete

## 2024-05-26 ENCOUNTER — Ambulatory Visit: Admitting: Podiatry

## 2024-05-26 ENCOUNTER — Encounter: Payer: Self-pay | Admitting: Podiatry

## 2024-05-26 DIAGNOSIS — E1140 Type 2 diabetes mellitus with diabetic neuropathy, unspecified: Secondary | ICD-10-CM

## 2024-05-26 DIAGNOSIS — M79674 Pain in right toe(s): Secondary | ICD-10-CM

## 2024-05-26 DIAGNOSIS — E1151 Type 2 diabetes mellitus with diabetic peripheral angiopathy without gangrene: Secondary | ICD-10-CM | POA: Diagnosis not present

## 2024-05-26 DIAGNOSIS — E1149 Type 2 diabetes mellitus with other diabetic neurological complication: Secondary | ICD-10-CM

## 2024-05-26 DIAGNOSIS — B351 Tinea unguium: Secondary | ICD-10-CM

## 2024-05-26 DIAGNOSIS — M79675 Pain in left toe(s): Secondary | ICD-10-CM | POA: Diagnosis not present

## 2024-05-27 NOTE — Progress Notes (Signed)
 Subjective:   Patient ID: Christopher [name], male   DOB: 78 y.o.   MRN: [id number]   HPI Patient presents with caregiver relative poor health has diabetes not sure its control but think sees around a proximate 8 on A1c.  He has thick yellow nailbeds 1-5 both feet that he cannot take care of and is not well educated on diabetes and footcare.  Does not smoke tries to be active   Review of Systems  All other systems reviewed and are negative.       Objective:  Physical Exam Vitals and nursing note reviewed.  Constitutional:      Appearance: He is well-developed.  Pulmonary:     Effort: Pulmonary effort is normal.   Musculoskeletal:        General: Normal range of motion.   Skin:    General: Skin is warm.   Neurological:     Mental Status: He is alert.     Neurovascular status indicates moderate diminishment of PT DP pulses but upon questioning does not appear to have claudication.  He has diminishment of sharp dull vibratory bilateral diminished range of motion subtalar midtarsal joint and slightly reduced muscle strength bilateral.  He is found to have severely thickened nail beds 1-5 both feet that are painful when pressed and are making shoe gear difficult.  Good digital perfusion well-oriented     Assessment:  Patient who has relative high risk with diabetes on long-term nature and moderate vascular neurological disease along with thick yellow brittle nailbeds 1-5 both feet painful     Plan:  H&P education to him and wife rendered today about daily inspections of his feet and what to look for for breaks in skin which is not present today.  Debrided nailbeds 1-5 both feet no iatrogenic bleeding reappoint routine care

## 2024-06-02 ENCOUNTER — Telehealth: Payer: Self-pay

## 2024-06-02 NOTE — Telephone Encounter (Signed)
 Copied from CRM [id number]. Topic: Clinical - Request for Lab/Test Order >> Jun 02, 2024  3:19 PM [place] wrote: Reason for CRM: Patient was told he was going to get a test for memory issues and has not heard anything since last visit with [facility]...wanting to know if this can still be done, please follow up as soon as possible with wife [place] La [phone]

## 2024-06-02 NOTE — Telephone Encounter (Deleted)
 Please advise. I don't see where this was in a note.

## 2024-06-02 NOTE — Telephone Encounter (Signed)
 Are you still pt PCP?

## 2024-06-03 NOTE — Telephone Encounter (Signed)
 Left message to return phone call on both phones.

## 2024-06-03 NOTE — Telephone Encounter (Signed)
 Pt spouse called back and made pt an appt.

## 2024-06-06 ENCOUNTER — Other Ambulatory Visit: Payer: Self-pay | Admitting: Nurse Practitioner

## 2024-06-08 ENCOUNTER — Ambulatory Visit (INDEPENDENT_AMBULATORY_CARE_PROVIDER_SITE_OTHER): Admitting: Adult Health

## 2024-06-08 ENCOUNTER — Encounter: Payer: Self-pay | Admitting: Adult Health

## 2024-06-08 ENCOUNTER — Ambulatory Visit: Payer: Self-pay | Admitting: Adult Health

## 2024-06-08 VITALS — BP 120/86 | HR 79 | Temp 97.9°F | Ht 75.0 in | Wt 209.0 lb

## 2024-06-08 DIAGNOSIS — R4189 Other symptoms and signs involving cognitive functions and awareness: Secondary | ICD-10-CM

## 2024-06-08 LAB — BASIC METABOLIC PANEL WITH GFR
BUN: 18 mg/dL (ref 6–23)
CO2: 31 meq/L (ref 19–32)
Calcium: 9.3 mg/dL (ref 8.4–10.5)
Chloride: 106 meq/L (ref 96–112)
Creatinine, Ser: 1.57 mg/dL — ABNORMAL HIGH (ref 0.40–1.50)
GFR: 42.42 mL/min — ABNORMAL LOW (ref >60.00)
Glucose, Bld: 163 mg/dL — ABNORMAL HIGH (ref 70–99)
Potassium: 4.1 meq/L (ref 3.5–5.1)
Sodium: 140 meq/L (ref 135–145)

## 2024-06-08 LAB — VITAMIN B12: Vitamin B-12: 480 pg/mL (ref 211–911)

## 2024-06-08 LAB — TSH: TSH: 1.02 u[IU]/mL (ref 0.35–5.50)

## 2024-06-08 LAB — VITAMIN D 25 HYDROXY (VIT D DEFICIENCY, FRACTURES): VITD: 28.49 ng/mL — ABNORMAL LOW (ref 30.00–100.00)

## 2024-06-08 NOTE — Patient Instructions (Signed)
 It was great seeing you today   Your initial test we did today was reassuring and I do not think you have dementia.   I am going to check some blood work on you today and also repeat the MRI of the head

## 2024-06-08 NOTE — Progress Notes (Signed)
 Subjective:    Patient ID: Christopher [name], male    DOB: March 18, 1946, 78 y.o.   MRN: [id number]  HPI 78 year old male who  has a past medical history of Diabetes mellitus type II, GERD (gastroesophageal reflux disease), Glaucoma, Hyperlipidemia, and Hypertension.   He presents to the office today to discuss memory loss that has been going on for multiple years. SABRA He reports that intermittently he will  forget who I am or where I am He is unsure of how long the confusion lasts for but  I eventually come around. This has been happening for years; he does not feel like it is getting worse but he has been noticing it more. He does not drive. He has not left the stove or sink on but has misplaced his phone from time to time. The last time this confusion happened was about a month ago.   He denies CP/SOB/UTI symptoms.   He did have an MRI of the brain in 11/2022 which showed  IMPRESSION: 1. No evidence of acute intracranial abnormality. 2. Mild chronic small vessel ischemic changes within the cerebral white matter. 3. Mild generalized parenchymal atrophy. 4. Paranasal sinus disease, as described.  Review of Systems See HPI   Past Medical History:  Diagnosis Date   Diabetes mellitus type II    GERD (gastroesophageal reflux disease)    Glaucoma    Hyperlipidemia    Hypertension     Social History   Socioeconomic History   Marital status: Married    Spouse name: Not on file   Number of children: Not on file   Years of education: Not on file   Highest education level: Not on file  Occupational History   Not on file  Tobacco Use   Smoking status: Never   Smokeless tobacco: Never  Vaping Use   Vaping status: Never Used  Substance and Sexual Activity   Alcohol use: No   Drug use: No   Sexual activity: Not on file  Other Topics Concern   Not on file  Social History Narrative   He enjoys playing golf    Social Drivers of Health   Financial Resource Strain: Low Risk   (08/29/2023)   Overall Financial Resource Strain (CARDIA)    Difficulty of Paying Living Expenses: Not hard at all  Food Insecurity: No Food Insecurity (08/29/2023)   Hunger Vital Sign    Worried About Running Out of Food in the Last Year: Never true    Ran Out of Food in the Last Year: Never true  Transportation Needs: No Transportation Needs (08/29/2023)   PRAPARE - Administrator, Civil Service (Medical): No    Lack of Transportation (Non-Medical): No  Physical Activity: Inactive (08/29/2023)   Exercise Vital Sign    Days of Exercise per Week: 0 days    Minutes of Exercise per Session: 0 min  Stress: No Stress Concern Present (08/29/2023)   [name]    Feeling of Stress : Not at all  Social Connections: Socially Integrated (08/29/2023)   Social Connection and Isolation Panel    Frequency of Communication with Friends and Family: More than three times a week    Frequency of Social Gatherings with Friends and Family: More than three times a week    Attends Religious Services: More than 4 times per year    Active Member of [employer] or Organizations: Yes    Attends Club or  Organization Meetings: More than 4 times per year    Marital Status: Married  Catering manager Violence: Not At Risk (08/29/2023)   Humiliation, Afraid, Rape, and Kick questionnaire    Fear of Current or Ex-Partner: No    Emotionally Abused: No    Physically Abused: No    Sexually Abused: No    Past Surgical History:  Procedure Laterality Date   COLONOSCOPY     HERNIA REPAIR     PATELLA FRACTURE SURGERY  1993    Family History  Problem Relation Age of Onset   Alcohol abuse Father    Diabetes Sister    Diabetes Brother    Diabetes Brother    Colon cancer Neg Hx    Esophageal cancer Neg Hx    Stomach cancer Neg Hx    Rectal cancer Neg Hx     No Known Allergies  Current Outpatient Medications on File Prior to Visit  Medication  Sig Dispense Refill   atorvastatin  (LIPITOR ) 80 MG tablet TAKE 1 TABLET BY MOUTH AT BEDTIME 90 tablet 1   brimonidine  (ALPHAGAN ) 0.15 % ophthalmic solution Place 1 drop into both eyes 2 (two) times daily.     Continuous Blood Gluc Receiver (DEXCOM G7 RECEIVER) DEVI Use to check blood sugar 1 each 0   Continuous Blood Gluc Sensor (DEXCOM G6 SENSOR) MISC Change every 10 days 9 each 2   Continuous Blood Gluc Transmit (DEXCOM G6 TRANSMITTER) MISC Use every 3 months 1 each 2   docusate sodium  (COLACE) 100 MG capsule Take 1 capsule (100 mg total) by mouth 2 (two) times daily. (Patient taking differently: Take 100 mg by mouth as needed.) 10 capsule 0   dorzolamide -timolol  (COSOPT ) 22.3-6.8 MG/ML ophthalmic solution Place 1 drop into both eyes 2 (two) times daily.      Insulin  Disposable Pump (OMNIPOD 5 DEXG7G6 PODS GEN 5) MISC CHANGE POD EVERY 3 DAYS 10 each 0   Insulin  Disposable Pump (OMNIPOD 5 G6 INTRO, GEN 5,) KIT Inject into the skin.     Insulin  Pen Needle 32G X 4 MM MISC Use as instructed to administer insulin  4-5X daily. E10.65 400 each 1   lisinopril  (ZESTRIL ) 2.5 MG tablet Take 1 tablet (2.5 mg total) by mouth daily. 90 tablet 3   Multiple Vitamins-Minerals (MULTIVITAMIN ADULTS 50+) TABS Take 1 tablet by mouth daily.     NOVOLOG  100 UNIT/ML injection INJECT UP TO 50 UNITS SUBCUTANEOUSLY ONCE DAILY IN  INSULIN   PUMP 50 mL 3   polyethylene glycol (MIRALAX  / GLYCOLAX ) 17 g packet Take 17 g by mouth daily. (Patient taking differently: Take 17 g by mouth as needed.) 14 each 0   No current facility-administered medications on file prior to visit.    BP 120/86   Pulse 79   Temp 97.9 F (36.6 C) (Oral)   Ht 6' 3 (1.905 m)   Wt 209 lb (94.8 kg)   SpO2 99%   BMI 26.12 kg/m       Objective:   Physical Exam Vitals and nursing note reviewed.  Constitutional:      Appearance: Normal appearance.   Cardiovascular:     Rate and Rhythm: Normal rate and regular rhythm.     Pulses: Normal  pulses.     Heart sounds: Normal heart sounds.  Pulmonary:     Effort: Pulmonary effort is normal.     Breath sounds: Normal breath sounds.   Musculoskeletal:        General: Normal range of motion.  Skin:    General: Skin is warm and dry.   Neurological:     General: No focal deficit present.     Mental Status: He is alert and oriented to person, place, and time.   Psychiatric:        Mood and Affect: Mood normal.        Behavior: Behavior normal.        Thought Content: Thought content normal.        Judgment: Judgment normal.        Assessment & Plan:   1. Cognitive impairment (Primary)    06/08/2024   11:14 AM  MMSE - Mini Mental State Exam  Orientation to time 4  Orientation to Place 4  Registration 3  Attention/ Calculation 5  Recall 0  Language- name 2 objects 2  Language- repeat 1  Language- follow 3 step command 3  Language- read & follow direction 1  Write a sentence 1  Copy design 1  Total score 25  - Will check blood work today and order repeat MRI of the brain.  - Consider referral to Neurology - Basic Metabolic Panel; Future - Vitamin B12; Future - VITAMIN D  25 Hydroxy (Vit-D Deficiency, Fractures); Future - TSH; Future - MR Brain Wo Contrast; Future  Darleene Shape, NP  Time spent with patient today was 31 minutes which consisted of chart review, discussing cogntive impairment, work up, treatment answering questions and documentation.

## 2024-06-10 ENCOUNTER — Other Ambulatory Visit: Payer: Self-pay | Admitting: Endocrinology

## 2024-06-10 ENCOUNTER — Telehealth: Payer: Self-pay | Admitting: *Deleted

## 2024-06-10 DIAGNOSIS — E109 Type 1 diabetes mellitus without complications: Secondary | ICD-10-CM

## 2024-06-10 NOTE — Telephone Encounter (Signed)
 Patient notified of update  and verbalized understanding.

## 2024-06-10 NOTE — Telephone Encounter (Signed)
 Reason for CRM: Patient is returning call to clinic regarding lab results please reach back out.        Christopher [name] [phone]

## 2024-06-12 ENCOUNTER — Other Ambulatory Visit: Payer: Self-pay | Admitting: Adult Health

## 2024-06-12 DIAGNOSIS — E108 Type 1 diabetes mellitus with unspecified complications: Secondary | ICD-10-CM

## 2024-06-12 DIAGNOSIS — I152 Hypertension secondary to endocrine disorders: Secondary | ICD-10-CM

## 2024-06-12 DIAGNOSIS — E109 Type 1 diabetes mellitus without complications: Secondary | ICD-10-CM | POA: Diagnosis not present

## 2024-06-17 ENCOUNTER — Ambulatory Visit
Admission: RE | Admit: 2024-06-17 | Discharge: 2024-06-17 | Disposition: A | Discharge status: Home / Self Care | Source: Ambulatory Visit | Attending: Adult Health | Admitting: Adult Health

## 2024-06-17 DIAGNOSIS — R4189 Other symptoms and signs involving cognitive functions and awareness: Secondary | ICD-10-CM

## 2024-06-17 DIAGNOSIS — G3184 Mild cognitive impairment, so stated: Secondary | ICD-10-CM | POA: Diagnosis not present

## 2024-06-30 MED ORDER — AMOXICILLIN-POT CLAVULANATE 875-125 MG PO TABS
1.0000 | ORAL_TABLET | Freq: Two times a day (BID) | ORAL | 0 refills | Status: DC
Start: 2024-06-30 — End: 2024-10-28

## 2024-07-01 ENCOUNTER — Telehealth: Payer: Self-pay

## 2024-07-01 ENCOUNTER — Telehealth: Payer: Self-pay | Admitting: Endocrinology

## 2024-07-01 DIAGNOSIS — Z0279 Encounter for issue of other medical certificate: Secondary | ICD-10-CM

## 2024-07-01 NOTE — Telephone Encounter (Signed)
 Paperwork obtained from folder

## 2024-07-01 NOTE — Telephone Encounter (Signed)
 Patient called and made aware paperwork complete. Patient states he will pick it up in the morning.

## 2024-07-01 NOTE — Telephone Encounter (Signed)
 Patient dropped off [place] DMV Paperwork for completion. Placed I'm providers box at the front desk  Call back [phone]

## 2024-07-02 ENCOUNTER — Telehealth: Payer: Self-pay | Admitting: Endocrinology

## 2024-07-02 ENCOUNTER — Telehealth: Payer: Self-pay | Admitting: Nurse Practitioner

## 2024-07-02 NOTE — Telephone Encounter (Signed)
 New Message:    Patient 's wife says patient have papers from the [facility], that needs to be filled out by his Cardiologist. She wants to know if it is alright to bring this paper by an have it filled out?

## 2024-07-02 NOTE — Telephone Encounter (Signed)
 Patient came in today 07/02/24 to pick up his DMV paperwork,paid form fee.

## 2024-07-02 NOTE — Telephone Encounter (Signed)
 Called patient back about message. Informed patient that he has not seen a provider in over a year and he would need to have his year follow-up before any provider would fill out any forms or not. Made patient an appointment to see Dr. Michele since he needs a cardiologist, he was a previous Dr. Dann patient. Patient agreed to plan and will bring paper work with him to see if Dr. Michele will sign.

## 2024-07-05 ENCOUNTER — Telehealth: Payer: Self-pay | Admitting: Adult Health

## 2024-07-05 NOTE — Telephone Encounter (Signed)
 Patient dropped off document DOT Medical review, to be filled out by provider. Patient requested to send it back via Call Patient to pick up within 5-days. Document is located in providers tray at front office.Please advise at Mobile [phone] (mobile)

## 2024-07-06 NOTE — Telephone Encounter (Signed)
 Left message on machine to inform the patient that his form is ready to be picked up.  Copy made for chart.

## 2024-07-06 NOTE — Telephone Encounter (Signed)
 Forms given to [facility].

## 2024-07-14 ENCOUNTER — Other Ambulatory Visit: Payer: Self-pay

## 2024-07-14 ENCOUNTER — Telehealth: Payer: Self-pay

## 2024-07-14 DIAGNOSIS — E109 Type 1 diabetes mellitus without complications: Secondary | ICD-10-CM

## 2024-07-14 MED ORDER — INSULIN ASPART 100 UNIT/ML IJ SOLN: INTRAMUSCULAR | 0 refills | Status: DC

## 2024-07-14 NOTE — Telephone Encounter (Signed)
 Patient called requesting a script be sent over for Novolog  using an immediate supply voucher offered by [employer]. Spoke with rep who provided the voucher info and was told that all the office needed to do was send in a prescription with this information and they could get it filled as a one time courtesy.

## 2024-07-15 ENCOUNTER — Ambulatory Visit: Attending: Cardiology | Admitting: Cardiology

## 2024-07-15 ENCOUNTER — Encounter: Payer: Self-pay | Admitting: Cardiology

## 2024-07-15 VITALS — BP 130/82 | HR 74 | Resp 16 | Ht 75.0 in | Wt 210.5 lb

## 2024-07-15 DIAGNOSIS — I10 Essential (primary) hypertension: Secondary | ICD-10-CM | POA: Diagnosis not present

## 2024-07-15 DIAGNOSIS — E785 Hyperlipidemia, unspecified: Secondary | ICD-10-CM | POA: Diagnosis not present
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
## 2024-07-15 NOTE — Patient Instructions (Signed)
 Follow-Up: At [facility], you and your health needs are our priority.  As part of our continuing mission to provide you with exceptional heart care, our providers are all part of one team.  This team includes your primary Cardiologist (physician) and Advanced Practice Providers or APPs (Physician Assistants and Nurse Practitioners) who all work together to provide you with the care you need, when you need it.  Your next appointment:   AS NEEDED   Provider:   Madonna Large, DO    We recommend signing up for the patient portal called MyChart.  Sign up information is provided on this After Visit Summary.  MyChart is used to connect with patients for Virtual Visits (Telemedicine).  Patients are able to view lab/test results, encounter notes, upcoming appointments, etc.  Non-urgent messages can be sent to your provider as well.   To learn more about what you can do with MyChart, go to [email or web address].

## 2024-08-03 ENCOUNTER — Encounter: Payer: Self-pay | Admitting: Endocrinology

## 2024-08-03 ENCOUNTER — Ambulatory Visit: Payer: Self-pay | Admitting: Endocrinology

## 2024-08-03 ENCOUNTER — Ambulatory Visit: Admitting: Endocrinology

## 2024-08-03 VITALS — BP 130/80 | HR 55 | Resp 20 | Ht 75.0 in | Wt 208.0 lb

## 2024-08-03 DIAGNOSIS — E109 Type 1 diabetes mellitus without complications: Secondary | ICD-10-CM | POA: Diagnosis not present

## 2024-08-03 DIAGNOSIS — E1065 Type 1 diabetes mellitus with hyperglycemia: Secondary | ICD-10-CM

## 2024-08-03 LAB — POCT GLYCOSYLATED HEMOGLOBIN (HGB A1C): Hemoglobin A1C: 6.7 % — AB (ref 4.0–5.6)

## 2024-08-03 MED ORDER — NOVOLOG 100 UNIT/ML IJ SOLN: INTRAMUSCULAR | 3 refills | Status: AC

## 2024-08-03 MED ORDER — DEXCOM G6 SENSOR MISC
3 refills | Status: AC
Start: 2024-08-03 — End: ?

## 2024-08-03 MED ORDER — DEXCOM G6 TRANSMITTER MISC: 3 refills | Status: AC

## 2024-08-03 NOTE — Progress Notes (Signed)
 Outpatient Endocrinology Note [place] Elzena Muston, MD  08/03/24  Patient's Name: Christopher [name]    DOB: [birth year]    MRN: [id number]                                                    REASON OF VISIT: Follow up of for type 1 diabetes mellitus  PCP: Merna Huxley, NP  HISTORY OF PRESENT ILLNESS:   NYQUAN SELBE is a 78 y.o. old male with past medical history listed below, is here for follow up of type 1 diabetes mellitus.   Pertinent Diabetes History: Patient was diagnosed with type 1 diabetes mellitus in 2010, insulin  therapy was started in 2013.  He was previously on Trulicity  and Victoza  as well which were stopped later.  He had tried Levemir  and NPH insulin  in the past.  History of DKA or diabetes related hospitalizations: yes, at least 2 times.  Previous diabetes education: Yes   Chronic Diabetes Complications : Retinopathy: no. Last ophthalmology exam was done on annually. Nephropathy: no, on lisinopril . Peripheral neuropathy: no Coronary artery disease: no Stroke: no  Relevant comorbidities and cardiovascular risk factors: Obesity: no Body mass index is 26 kg/m.  Hypertension: yes Hyperlipidemia. yes  Current / Home Diabetic regimen includes:  OmniPod 5 with Dexcom G6, uses NovoLog  U100.  Insulin  Pump setting:  Basal ( 25.2 units/day) MN- 1.05u/hour Bolus  CHO Ratio (1unit:CHO) MN- 1:1  He uses fixed carb count  for 12 breakfast, 2 - 5 for lunch and 2 -5 for supper.  Correction/Sensitivity: MN- 1:35  Target: 130 and 120  Active insulin  time: 4 hours  Prior diabetic medications: Trulicity , Victoza , Levemir , NPH.  CONTINUOUS GLUCOSE MONITORING SYSTEM (CGMS) / INSULIN  PUMP INTERPRETATION:                         OmniPod 5 Pump & Dexcom G6 Sensor Download (Reviewed and summarized below.)  Dates August 6 to August 03, 2024, 14 days Glucose Management Indicator: 7.4%  Average total daily insulin :49 units, Basal: 56%, Bolus: 44%        Trends:   Random hyperglycemia postprandially with blood sugar up to 250s range mostly with breakfast and sometimes with lunch.  Blood sugar overnight and in between the meals are acceptable.  No hypoglycemia.  Hypoglycemia: Patient has no hypoglycemic episodes. Patient has hypoglycemia awareness.    Factors modifying glucose control: 1.  Diabetic diet assessment: 3 meals a day breakfast (oatmeal usually), lunch and dinner however sometimes he eats cake or ice cream.   2.  Staying active or exercising: Playing golf and active at home.  3.  Medication compliance: compliant all of the time.  Interval history  Pump and CGM data as reviewed above.  Hemoglobin A1c today 6.7%.  He denies any complaints.  No numbness and tingling of the feet.  Reports had diabetic eye exam few months ago and was normal.  He is accompanied by wife in the clinic today.  No other complaints.  REVIEW OF SYSTEMS As per history of present illness.   PAST MEDICAL HISTORY: Past Medical History:  Diagnosis Date   Diabetes mellitus type II    GERD (gastroesophageal reflux disease)    Glaucoma    Hyperlipidemia    Hypertension     PAST SURGICAL HISTORY: Past Surgical History:  Procedure Laterality Date   COLONOSCOPY     HERNIA REPAIR     PATELLA FRACTURE SURGERY  1993    ALLERGIES: No Known Allergies  FAMILY HISTORY:  Family History  Problem Relation Age of Onset   Alcohol abuse Father    Diabetes Sister    Diabetes Brother    Diabetes Brother    Colon cancer Neg Hx    Esophageal cancer Neg Hx    Stomach cancer Neg Hx    Rectal cancer Neg Hx     SOCIAL HISTORY: Social History   Socioeconomic History   Marital status: Married    Spouse name: Not on file   Number of children: Not on file   Years of education: Not on file   Highest education level: Not on file  Occupational History   Not on file  Tobacco Use   Smoking status: Never   Smokeless tobacco: Never  Vaping Use   Vaping status: Never  Used  Substance and Sexual Activity   Alcohol use: No   Drug use: No   Sexual activity: Not on file  Other Topics Concern   Not on file  Social History Narrative   He enjoys playing golf    Social Drivers of Health   Financial Resource Strain: Low Risk  (08/29/2023)   Overall Financial Resource Strain (CARDIA)    Difficulty of Paying Living Expenses: Not hard at all  Food Insecurity: No Food Insecurity (08/29/2023)   Hunger Vital Sign    Worried About Running Out of Food in the Last Year: Never true    Ran Out of Food in the Last Year: Never true  Transportation Needs: No Transportation Needs (08/29/2023)   PRAPARE - Administrator, Civil Service (Medical): No    Lack of Transportation (Non-Medical): No  Physical Activity: Inactive (08/29/2023)   Exercise Vital Sign    Days of Exercise per Week: 0 days    Minutes of Exercise per Session: 0 min  Stress: No Stress Concern Present (08/29/2023)   [name]    Feeling of Stress : Not at all  Social Connections: Socially Integrated (08/29/2023)   Social Connection and Isolation Panel    Frequency of Communication with Friends and Family: More than three times a week    Frequency of Social Gatherings with Friends and Family: More than three times a week    Attends Religious Services: More than 4 times per year    Active Member of [employer] or Organizations: Yes    Attends Engineer, structural: More than 4 times per year    Marital Status: Married    MEDICATIONS:  Current Outpatient Medications  Medication Sig Dispense Refill   amoxicillin -clavulanate (AUGMENTIN ) 875-125 MG tablet Take 1 tablet by mouth 2 (two) times daily. 20 tablet 0   atorvastatin  (LIPITOR ) 80 MG tablet Take 1 tablet (80 mg total) by mouth at bedtime. 30 tablet 0   brimonidine  (ALPHAGAN ) 0.15 % ophthalmic solution Place 1 drop into both eyes 2 (two) times daily.     Continuous Blood  Gluc Receiver (DEXCOM G7 RECEIVER) DEVI Use to check blood sugar 1 each 0   docusate sodium  (COLACE) 100 MG capsule Take 1 capsule (100 mg total) by mouth 2 (two) times daily. (Patient taking differently: Take 100 mg by mouth as needed for mild constipation.) 10 capsule 0   dorzolamide -timolol  (COSOPT ) 22.3-6.8 MG/ML ophthalmic solution Place 1 drop into both eyes 2 (  two) times daily.      insulin  aspart (NOVOLOG ) 100 UNIT/ML injection INJECT UP TO 50 UNITS SUBCUTANEOUSLY ONCE DAILY IN INSULIN  PUMP, 20 mL 0   Insulin  Disposable Pump (OMNIPOD 5 DEXG7G6 PODS GEN 5) MISC CHANGE POD EVERY 3 DAYS 10 each 3   Insulin  Disposable Pump (OMNIPOD 5 G6 INTRO, GEN 5,) KIT Inject into the skin.     Insulin  Pen Needle 32G X 4 MM MISC Use as instructed to administer insulin  4-5X daily. E10.65 400 each 1   lisinopril  (ZESTRIL ) 2.5 MG tablet Take 1 tablet by mouth once daily 90 tablet 0   Multiple Vitamins-Minerals (MULTIVITAMIN ADULTS 50+) TABS Take 1 tablet by mouth daily.     polyethylene glycol (MIRALAX  / GLYCOLAX ) 17 g packet Take 17 g by mouth daily. (Patient taking differently: Take 17 g by mouth as needed for mild constipation.) 14 each 0   Continuous Glucose Sensor (DEXCOM G6 SENSOR) MISC Change every 10 days 9 each 3   Continuous Glucose Transmitter (DEXCOM G6 TRANSMITTER) MISC Use every 3 months 1 each 3   NOVOLOG  100 UNIT/ML injection INJECT UP TO 60 UNITS SUBCUTANEOUSLY ONCE DAILY IN  INSULIN   PUMP 50 mL 3   No current facility-administered medications for this visit.    PHYSICAL EXAM: Vitals:   08/03/24 1347  BP: 130/80  Pulse: (!) 55  Resp: 20  SpO2: 99%  Weight: 208 lb (94.3 kg)  Height: 6' 3 (1.905 m)    Body mass index is 26 kg/m.  Wt Readings from Last 3 Encounters:  08/03/24 208 lb (94.3 kg)  07/15/24 210 lb 8 oz (95.5 kg)  06/08/24 209 lb (94.8 kg)    General: Well developed, well nourished male in no apparent distress.  HEENT: AT/[place], no external lesions.  Eyes: Conjunctiva  clear and no icterus. Neck: Neck supple  Lungs: Respirations not labored Neurologic: Alert, oriented, normal speech Extremities / Skin: Dry.  Psychiatric: Does not appear depressed or anxious  Diabetic Foot Exam - Simple   No data filed     LABS Reviewed Lab Results  Component Value Date   HGBA1C 6.7 (A) 08/03/2024   HGBA1C 6.7 (A) 02/09/2024   HGBA1C 6.6 (A) 11/07/2023   Lab Results  Component Value Date   FRUCTOSAMINE 427 (H) 10/04/2022   FRUCTOSAMINE 479 (H) 09/16/2016   Lab Results  Component Value Date   CHOL 214 (H) 03/20/2023   HDL 51.20 03/20/2023   LDLCALC 149 (H) 03/20/2023   LDLDIRECT 135.3 06/17/2013   TRIG 67.0 03/20/2023   CHOLHDL 4 03/20/2023   Lab Results  Component Value Date   MICRALBCREAT 0.1 03/19/2012   MICRALBCREAT 35.5 (H) 02/06/2009   Lab Results  Component Value Date   CREATININE 1.57 (H) 06/08/2024   Lab Results  Component Value Date   GFR 42.42 (L) 06/08/2024    ASSESSMENT / PLAN  1. Type 1 diabetes mellitus without complication (HCC)   2. Uncontrolled type 1 diabetes mellitus with hyperglycemia (HCC)     Diabetes Mellitus type 1, complicated by no other known complications. - Diabetic status / severity: fair Controlled.  Lab Results  Component Value Date   HGBA1C 6.7 (A) 08/03/2024    - Hemoglobin A1c goal <6.5%   - Medications:  Insulin  pump setting as follows: No change today. OmniPod 5 insulin  Pump setting: Using NovoLog  U100.  With Dexcom G6.  Insulin  Pump setting:  Basal MN- 1.05u/hour Bolus  CHO Ratio (1unit:CHO) MN- 1:1  He uses fixed carb  count 12 for breakfast, 4-6 for lunch and 4-6 for supper.  He is mainly using for for lunch and supper advised to use up to 6 carb for lunch and supper.  Discussed in detail to adjust based on meal size and type.  Advised to take 1-2 carb for sugary snacks like ice cream/cracker.  Advised to increase the carb count for lunch and supper.  Advised to use carb count up to  14 for breakfast.   Correction/Sensitivity: MN- 1:35  Target: 130 and 120  Active insulin  time: 4 hours  - Home glucose testing: continue CGM and check blood glucose as needed.  He prefers to stay on Dexcom G6.  Discussed about trying Dexcom G7. - Discussed/ Gave Hypoglycemia treatment plan.  # Consult : not required at this time.   # Annual urine for microalbuminuria/ creatinine ratio, no microalbuminuria currently, continue ACE/ARB /lisinopril .  Will check today. Last  Lab Results  Component Value Date   MICRALBCREAT 0.1 03/19/2012    # Foot check nightly.  # Annual dilated diabetic eye exams.   - Diet: Eat reasonable portion sizes to promote a healthy weight - Life style / activity / exercise: Discussed.  2. Blood pressure  -  BP Readings from Last 1 Encounters:  08/03/24 130/80    - Control is in target.  - No change in current plans.  3. Lipid status / Hyperlipidemia - Last  Lab Results  Component Value Date   LDLCALC 149 (H) 03/20/2023   - Continue atorvastatin  80 mg daily.  Managed by primary care provider.  Diagnoses and all orders for this visit:  Type 1 diabetes mellitus without complication (HCC) -     POCT glycosylated hemoglobin (Hb A1C) -     Microalbumin / creatinine urine ratio -     NOVOLOG  100 UNIT/ML injection; INJECT UP TO 60 UNITS SUBCUTANEOUSLY ONCE DAILY IN  INSULIN   PUMP  Uncontrolled type 1 diabetes mellitus with hyperglycemia (HCC) -     Continuous Glucose Sensor (DEXCOM G6 SENSOR) MISC; Change every 10 days -     Continuous Glucose Transmitter (DEXCOM G6 TRANSMITTER) MISC; Use every 3 months    DISPOSITION Follow up in clinic in 3 - 4 months suggested.   All questions answered and patient verbalized understanding of the plan.  [place] Lanisha Stepanian, MD [facility] [address] [place], [address] [place], [place] [address] Phone # [phone]  At least part of this note was generated using voice  recognition software. Inadvertent word errors may have occurred, which were not recognized during the proofreading process.

## 2024-08-04 LAB — MICROALBUMIN / CREATININE URINE RATIO
Creatinine, Urine: 229 mg/dL (ref 20–320)
Microalb Creat Ratio: 3 mg/g{creat} (ref <30)
Microalb, Ur: 0.7 mg/dL

## 2024-08-26 ENCOUNTER — Ambulatory Visit: Admitting: Podiatry

## 2024-09-01 ENCOUNTER — Ambulatory Visit (INDEPENDENT_AMBULATORY_CARE_PROVIDER_SITE_OTHER): Payer: Medicare HMO

## 2024-09-01 VITALS — BP 124/64 | HR 76 | Temp 97.9°F | Ht 75.0 in | Wt 210.1 lb

## 2024-09-01 DIAGNOSIS — Z0000 Encounter for general adult medical examination without abnormal findings: Secondary | ICD-10-CM | POA: Diagnosis not present

## 2024-09-01 NOTE — Progress Notes (Signed)
 Subjective:   Christopher [name] is a 78 y.o. who presents for a Medicare Wellness preventive visit.  As a reminder, Annual Wellness Visits don't include a physical exam, and some assessments may be limited, especially if this visit is performed virtually. We may recommend an in-person follow-up visit with your provider if needed.  Visit Complete: In person    Persons Participating in Visit: Patient.  AWV Questionnaire: No: Patient Medicare AWV questionnaire was not completed prior to this visit.  Cardiac Risk Factors include: advanced age (>75men, >33 women);male gender;diabetes mellitus;hypertension     Objective:    Today's Vitals   09/01/24 1042  BP: 124/64  Pulse: 76  Temp: 97.9 F (36.6 C)  TempSrc: Oral  SpO2: 96%  Weight: 210 lb 1.6 oz (95.3 kg)  Height: 6' 3 (1.905 m)   Body mass index is 26.26 kg/m.     09/01/2024   10:59 AM 03/07/2024    4:08 PM 12/03/2023    1:08 AM 08/29/2023   11:23 AM 04/19/2023   11:39 PM 12/03/2022    2:00 PM 11/29/2022    9:51 AM  Advanced Directives  Does Patient Have a Medical Advance Directive? Yes No No No No No No  Type of Estate agent of [place];Living will        Copy of Healthcare Power of Attorney in Chart? No - copy requested        Would patient like information on creating a medical advance directive?   No - Patient declined No - Patient declined  No - Guardian declined;No - Patient declined     Current Medications (verified) Outpatient Encounter Medications as of 09/01/2024  Medication Sig   amoxicillin -clavulanate (AUGMENTIN ) 875-125 MG tablet Take 1 tablet by mouth 2 (two) times daily.   atorvastatin  (LIPITOR ) 80 MG tablet Take 1 tablet (80 mg total) by mouth at bedtime.   brimonidine  (ALPHAGAN ) 0.15 % ophthalmic solution Place 1 drop into both eyes 2 (two) times daily.   Continuous Blood Gluc Receiver (DEXCOM G7 RECEIVER) DEVI Use to check blood sugar   Continuous Glucose Sensor (DEXCOM G6  SENSOR) MISC Change every 10 days   Continuous Glucose Transmitter (DEXCOM G6 TRANSMITTER) MISC Use every 3 months   docusate sodium  (COLACE) 100 MG capsule Take 1 capsule (100 mg total) by mouth 2 (two) times daily. (Patient taking differently: Take 100 mg by mouth as needed for mild constipation.)   dorzolamide -timolol  (COSOPT ) 22.3-6.8 MG/ML ophthalmic solution Place 1 drop into both eyes 2 (two) times daily.    insulin  aspart (NOVOLOG ) 100 UNIT/ML injection INJECT UP TO 50 UNITS SUBCUTANEOUSLY ONCE DAILY IN INSULIN  PUMP,   Insulin  Disposable Pump (OMNIPOD 5 DEXG7G6 PODS GEN 5) MISC CHANGE POD EVERY 3 DAYS   Insulin  Disposable Pump (OMNIPOD 5 G6 INTRO, GEN 5,) KIT Inject into the skin.   Insulin  Pen Needle 32G X 4 MM MISC Use as instructed to administer insulin  4-5X daily. E10.65   lisinopril  (ZESTRIL ) 2.5 MG tablet Take 1 tablet by mouth once daily   Multiple Vitamins-Minerals (MULTIVITAMIN ADULTS 50+) TABS Take 1 tablet by mouth daily.   NOVOLOG  100 UNIT/ML injection INJECT UP TO 60 UNITS SUBCUTANEOUSLY ONCE DAILY IN  INSULIN   PUMP   polyethylene glycol (MIRALAX  / GLYCOLAX ) 17 g packet Take 17 g by mouth daily. (Patient taking differently: Take 17 g by mouth as needed for mild constipation.)   No facility-administered encounter medications on file as of 09/01/2024.    Allergies (verified) Patient  has no known allergies.   History: Past Medical History:  Diagnosis Date   Diabetes mellitus type II    GERD (gastroesophageal reflux disease)    Glaucoma    Hyperlipidemia    Hypertension    Past Surgical History:  Procedure Laterality Date   COLONOSCOPY     HERNIA REPAIR     PATELLA FRACTURE SURGERY  1993   Family History  Problem Relation Age of Onset   Alcohol abuse Father    Diabetes Sister    Diabetes Brother    Diabetes Brother    Colon cancer Neg Hx    Esophageal cancer Neg Hx    Stomach cancer Neg Hx    Rectal cancer Neg Hx    Social History   Socioeconomic History    Marital status: Married    Spouse name: Not on file   Number of children: Not on file   Years of education: Not on file   Highest education level: Not on file  Occupational History   Not on file  Tobacco Use   Smoking status: Never   Smokeless tobacco: Never  Vaping Use   Vaping status: Never Used  Substance and Sexual Activity   Alcohol use: No   Drug use: No   Sexual activity: Not on file  Other Topics Concern   Not on file  Social History Narrative   He enjoys playing golf    Social Drivers of Health   Financial Resource Strain: Low Risk  (09/01/2024)   Overall Financial Resource Strain (CARDIA)    Difficulty of Paying Living Expenses: Not hard at all  Food Insecurity: No Food Insecurity (09/01/2024)   Hunger Vital Sign    Worried About Running Out of Food in the Last Year: Never true    Ran Out of Food in the Last Year: Never true  Transportation Needs: No Transportation Needs (09/01/2024)   PRAPARE - Administrator, Civil Service (Medical): No    Lack of Transportation (Non-Medical): No  Physical Activity: Inactive (09/01/2024)   Exercise Vital Sign    Days of Exercise per Week: 0 days    Minutes of Exercise per Session: 0 min  Stress: No Stress Concern Present (09/01/2024)   [name]    Feeling of Stress: Not at all  Social Connections: Socially Integrated (09/01/2024)   Social Connection and Isolation Panel    Frequency of Communication with Friends and Family: More than three times a week    Frequency of Social Gatherings with Friends and Family: More than three times a week    Attends Religious Services: More than 4 times per year    Active Member of [employer] or Organizations: Yes    Attends Engineer, structural: More than 4 times per year    Marital Status: Married    Tobacco Counseling Counseling given: Not Answered    Clinical Intake:  Pre-visit preparation  completed: Yes  Pain : No/denies pain     BMI - recorded: 26.26 Nutritional Status: BMI 25 -29 Overweight Nutritional Risks: None Diabetes: Yes CBG done?: No Did pt. bring in CBG monitor from home?: No  Lab Results  Component Value Date   HGBA1C 6.7 (A) 08/03/2024   HGBA1C 6.7 (A) 02/09/2024   HGBA1C 6.6 (A) 11/07/2023     How often do you need to have someone help you when you read instructions, pamphlets, or other written materials from your doctor or pharmacy?: 1 -  Never  Interpreter Needed?: No  Information entered by :: Rojelio Blush LPN   Activities of Daily Living     09/01/2024   10:58 AM  In your present state of health, do you have any difficulty performing the following activities:  Hearing? 0  Vision? 0  Difficulty concentrating or making decisions? 0  Walking or climbing stairs? 0  Dressing or bathing? 0  Doing errands, shopping? 0  Preparing Food and eating ? N  Using the Toilet? N  In the past six months, have you accidently leaked urine? N  Do you have problems with loss of bowel control? N  Managing your Medications? N  Managing your Finances? N  Housekeeping or managing your Housekeeping? N    Patient Care Team: Merna Huxley, NP as PCP - General (Family Medicine) Michele Richardson, DO as PCP - Cardiology (Cardiology) Kassie Mallick, MD (Inactive) as Consulting Physician (Endocrinology) Lionell Jon DEL, [facility] (Pharmacist)  I have updated your Care Teams any recent Medical Services you may have received from other providers in the past year.     Assessment:   This is a routine wellness examination for [place].  Hearing/Vision screen Hearing Screening - Comments:: Denies hearing difficulties   Vision Screening - Comments:: Wears rx glasses - up to date with routine eye exams with [facility]   Goals Addressed               This Visit's Progress     Increase physical activity (pt-stated)        Get more active.       Depression  Screen     09/01/2024   10:44 AM 08/29/2023   11:20 AM 08/13/2023    2:31 PM 04/03/2022    9:11 AM 12/14/2021    7:47 AM 12/13/2020   11:41 AM 10/06/2019    8:11 AM  PHQ 2/9 Scores  PHQ - 2 Score 0 0 0 0 0 0 0  PHQ- 9 Score  0 2 4 0 0 0    Fall Risk     09/01/2024   10:58 AM 08/29/2023   11:22 AM 08/13/2023    2:31 PM 03/19/2023    7:05 AM 04/03/2022    9:12 AM  Fall Risk   Falls in the past year? 0 0 0 0 1  Number falls in past yr: 0 0  0 0  Injury with Fall? 0 0  0 0  Risk for fall due to : No Fall Risks No Fall Risks  No Fall Risks Impaired balance/gait  Follow up Falls evaluation completed Falls prevention discussed  Falls evaluation completed Falls evaluation completed      Data saved with a previous flowsheet row definition    MEDICARE RISK AT HOME:  Medicare Risk at Home Any stairs in or around the home?: No If so, are there any without handrails?: No Home free of loose throw rugs in walkways, pet beds, electrical cords, etc?: Yes Adequate lighting in your home to reduce risk of falls?: Yes Life alert?: No Use of a cane, walker or w/c?: No Grab bars in the bathroom?: Yes Shower chair or bench in shower?: No Elevated toilet seat or a handicapped toilet?: No  TIMED UP AND GO:  Was the test performed?  Yes  Length of time to ambulate 10 feet: 10 sec Gait slow and steady without use of assistive device  Cognitive Function: 6CIT completed    06/08/2024   11:14 AM  MMSE - Mini Mental  State Exam  Orientation to time 4  Orientation to Place 4  Registration 3  Attention/ Calculation 5  Recall 0  Language- name 2 objects 2  Language- repeat 1  Language- follow 3 step command 3  Language- read & follow direction 1  Write a sentence 1  Copy design 1  Total score 25        09/01/2024   10:59 AM 08/29/2023   11:24 AM 08/29/2023   11:23 AM  6CIT Screen  What Year? 0 points 0 points 0 points  What month? 0 points 0 points 0 points  What time? 0 points 0 points 0  points  Count back from 20 0 points 0 points 0 points  Months in reverse 4 points 0 points 0 points  Repeat phrase 10 points 0 points 10 points  Total Score 14 points 0 points 10 points    Immunizations Immunization History  Administered Date(s) Administered   Fluad  Quad(high Dose 65+) 10/06/2019, 12/13/2020, 10/19/2021, 10/30/2022   Fluad  Trivalent(High Dose 65+) 09/05/2023   INFLUENZA, HIGH DOSE SEASONAL PF 12/12/2014, 08/22/2015, 09/17/2016, 10/27/2017, 08/26/2018   Influenza Whole 09/13/2009, 01/01/2011   Influenza,inj,Quad PF,6+ Mos 08/25/2013   PFIZER Comirnaty (Gray Top)Covid-19 Tri-Sucrose Vaccine 04/18/2021   PFIZER(Purple Top)SARS-COV-2 Vaccination 02/18/2020, 03/15/2020, 09/30/2020   Pfizer Covid-19 Vaccine Bivalent Booster 74yrs & up 10/22/2021   Pfizer(Comirnaty )Fall Seasonal Vaccine 12 years and older 10/02/2022, 09/05/2023   Pneumococcal Conjugate-13 01/08/2016   Pneumococcal Polysaccharide-23 09/13/2009, 03/31/2017   Td 05/08/2007, 08/26/2018    Screening Tests Health Maintenance  Topic Date Due   Zoster Vaccines- Shingrix (1 of 2) Never done   Influenza Vaccine  07/16/2024   COVID-19 Vaccine (8 - 2024-25 season) 08/16/2024   HEMOGLOBIN A1C  02/03/2025   FOOT EXAM  02/08/2025   OPHTHALMOLOGY EXAM  02/13/2025   Diabetic kidney evaluation - eGFR measurement  06/08/2025   Diabetic kidney evaluation - Urine ACR  08/03/2025   Medicare Annual Wellness (AWV)  09/01/2025   Colonoscopy  11/02/2026   DTaP/Tdap/Td (3 - Tdap) 08/26/2028   Pneumococcal Vaccine: 50+ Years  Completed   Hepatitis C Screening  Completed   HPV VACCINES  Aged Out   Meningococcal B Vaccine  Aged Out    Health Maintenance Items Addressed:   Additional Screening:  Vision Screening: Recommended annual ophthalmology exams for early detection of glaucoma and other disorders of the eye. Is the patient up to date with their annual eye exam?  Yes  Who is the provider or what is the name of the  office in which the patient attends annual eye exams? [facility]  Dental Screening: Recommended annual dental exams for proper oral hygiene  Community Resource Referral / Chronic Care Management: CRR required this visit?  No   CCM required this visit?  No   Plan:    I have personally reviewed and noted the following in the patient's chart:   Medical and social history Use of alcohol, tobacco or illicit drugs  Current medications and supplements including opioid prescriptions. Patient is not currently taking opioid prescriptions. Functional ability and status Nutritional status Physical activity Advanced directives List of other physicians Hospitalizations, surgeries, and ER visits in previous 12 months Vitals Screenings to include cognitive, depression, and falls Referrals and appointments  In addition, I have reviewed and discussed with patient certain preventive protocols, quality metrics, and best practice recommendations. A written personalized care plan for preventive services as well as general preventive health recommendations were provided to patient.   Rojelio  LELON Blush, LPN   [id number]   After Visit Summary: (In Person-Printed) AVS printed and given to the patient  Notes: Nothing significant to report at this time.

## 2024-09-01 NOTE — Patient Instructions (Addendum)
 Mr. Bessinger,  Thank you for taking the time for your Medicare Wellness Visit. I appreciate your continued commitment to your health goals. Please review the care plan we discussed, and feel free to reach out if I can assist you further.  Medicare recommends these wellness visits once per year to help you and your care team stay ahead of potential health issues. These visits are designed to focus on prevention, allowing your provider to concentrate on managing your acute and chronic conditions during your regular appointments.  Please note that Annual Wellness Visits do not include a physical exam. Some assessments may be limited, especially if the visit was conducted virtually. If needed, we may recommend a separate in-person follow-up with your provider.  Ongoing Care Seeing your primary care provider every 3 to 6 months helps us  monitor your health and provide consistent, personalized care.   Referrals If a referral was made during today's visit and you haven't received any updates within two weeks, please contact the referred provider directly to check on the status.  Recommended Screenings:  Health Maintenance  Topic Date Due   Zoster (Shingles) Vaccine (1 of 2) Never done   Flu Shot  07/16/2024   COVID-19 Vaccine (8 - 2024-25 season) 08/16/2024   Hemoglobin A1C  02/03/2025   Complete foot exam   02/08/2025   Eye exam for diabetics  02/13/2025   Yearly kidney function blood test for diabetes  06/08/2025   Yearly kidney health urinalysis for diabetes  08/03/2025   Medicare Annual Wellness Visit  09/01/2025   Colon Cancer Screening  11/02/2026   DTaP/Tdap/Td vaccine (3 - Tdap) 08/26/2028   Pneumococcal Vaccine for age over 41  Completed   Hepatitis C Screening  Completed   HPV Vaccine  Aged Out   Meningitis B Vaccine  Aged Out       09/01/2024   10:59 AM  Advanced Directives  Does Patient Have a Medical Advance Directive? Yes  Type of Estate agent of  [place];Living will  Copy of Healthcare Power of Attorney in Chart? No - copy requested   Advance Care Planning is important because it: Ensures you receive medical care that aligns with your values, goals, and preferences. Provides guidance to your family and loved ones, reducing the emotional burden of decision-making during critical moments.  Vision: Annual vision screenings are recommended for early detection of glaucoma, cataracts, and diabetic retinopathy. These exams can also reveal signs of chronic conditions such as diabetes and high blood pressure.  Dental: Annual dental screenings help detect early signs of oral cancer, gum disease, and other conditions linked to overall health, including heart disease and diabetes.  Please see the attached documents for additional preventive care recommendations.

## 2024-09-08 ENCOUNTER — Ambulatory Visit: Admitting: Podiatry

## 2024-09-10 DIAGNOSIS — E109 Type 1 diabetes mellitus without complications: Secondary | ICD-10-CM | POA: Diagnosis not present

## 2024-09-14 ENCOUNTER — Other Ambulatory Visit: Payer: Self-pay | Admitting: Adult Health

## 2024-09-14 DIAGNOSIS — I152 Hypertension secondary to endocrine disorders: Secondary | ICD-10-CM

## 2024-09-14 DIAGNOSIS — E108 Type 1 diabetes mellitus with unspecified complications: Secondary | ICD-10-CM

## 2024-09-28 ENCOUNTER — Other Ambulatory Visit (HOSPITAL_BASED_OUTPATIENT_CLINIC_OR_DEPARTMENT_OTHER): Payer: Self-pay

## 2024-09-28 MED ORDER — FLUZONE HIGH-DOSE 0.5 ML IM SUSY
0.5000 mL | PREFILLED_SYRINGE | Freq: Once | INTRAMUSCULAR | 0 refills | Status: AC
  Filled 2024-09-28: qty 0.5, 1d supply, fill #0

## 2024-09-30 ENCOUNTER — Ambulatory Visit: Admitting: Adult Health

## 2024-09-30 VITALS — BP 170/100 | HR 81 | Temp 97.9°F | Ht 75.0 in | Wt 210.0 lb

## 2024-09-30 DIAGNOSIS — E108 Type 1 diabetes mellitus with unspecified complications: Secondary | ICD-10-CM | POA: Diagnosis not present

## 2024-09-30 DIAGNOSIS — R4189 Other symptoms and signs involving cognitive functions and awareness: Secondary | ICD-10-CM | POA: Diagnosis not present

## 2024-09-30 DIAGNOSIS — I10 Essential (primary) hypertension: Secondary | ICD-10-CM | POA: Diagnosis not present

## 2024-09-30 NOTE — Progress Notes (Signed)
 Subjective:    Patient ID: Christopher [name], male    DOB: [birth year], 78 y.o.   MRN: [id number]  HPI Discussed the use of AI scribe software for clinical note transcription with the patient, who gave verbal consent to proceed.  History of Present Illness   Christopher [name] is a 78 year old male who presents with worsening memory issues    His wife is with him today.   He experiences worsening memory issues, including difficulty recognizing familiar people and struggling with basic tasks such as using the car door handle and remote control.  He has diabetes with episodes of hyperglycemia, with blood sugar levels reaching up to 400 mg/dL. His blood sugar fluctuates based on diet and insulin  management, which his caregiver oversees. Last week, he experienced speech difficulties due to high blood sugar but recovered. His caregiver manages his diet and insulin .      His last MMSE was 25/30   He did have an MRI of the brain 3 months ago which showed Mild age-related change without acute intracranial pathology.    Review of Systems See HPI   Past Medical History:  Diagnosis Date   Diabetes mellitus type II    GERD (gastroesophageal reflux disease)    Glaucoma    Hyperlipidemia    Hypertension     Social History   Socioeconomic History   Marital status: Married    Spouse name: Not on file   Number of children: Not on file   Years of education: Not on file   Highest education level: Not on file  Occupational History   Not on file  Tobacco Use   Smoking status: Never   Smokeless tobacco: Never  Vaping Use   Vaping status: Never Used  Substance and Sexual Activity   Alcohol use: No   Drug use: No   Sexual activity: Not on file  Other Topics Concern   Not on file  Social History Narrative   He enjoys playing golf    Social Drivers of Health   Financial Resource Strain: Low Risk  (09/01/2024)   Overall Financial Resource Strain (CARDIA)    Difficulty of Paying Living  Expenses: Not hard at all  Food Insecurity: No Food Insecurity (09/01/2024)   Hunger Vital Sign    Worried About Running Out of Food in the Last Year: Never true    Ran Out of Food in the Last Year: Never true  Transportation Needs: No Transportation Needs (09/01/2024)   PRAPARE - Administrator, Civil Service (Medical): No    Lack of Transportation (Non-Medical): No  Physical Activity: Inactive (09/01/2024)   Exercise Vital Sign    Days of Exercise per Week: 0 days    Minutes of Exercise per Session: 0 min  Stress: No Stress Concern Present (09/01/2024)   [name]    Feeling of Stress: Not at all  Social Connections: Socially Integrated (09/01/2024)   Social Connection and Isolation Panel    Frequency of Communication with Friends and Family: More than three times a week    Frequency of Social Gatherings with Friends and Family: More than three times a week    Attends Religious Services: More than 4 times per year    Active Member of [employer] or Organizations: Yes    Attends Engineer, structural: More than 4 times per year    Marital Status: Married  Catering manager Violence: Not At Risk (  09/01/2024)   Humiliation, Afraid, Rape, and Kick questionnaire    Fear of Current or Ex-Partner: No    Emotionally Abused: No    Physically Abused: No    Sexually Abused: No    Past Surgical History:  Procedure Laterality Date   COLONOSCOPY     HERNIA REPAIR     PATELLA FRACTURE SURGERY  1993    Family History  Problem Relation Age of Onset   Alcohol abuse Father    Diabetes Sister    Diabetes Brother    Diabetes Brother    Colon cancer Neg Hx    Esophageal cancer Neg Hx    Stomach cancer Neg Hx    Rectal cancer Neg Hx     No Known Allergies  Current Outpatient Medications on File Prior to Visit  Medication Sig Dispense Refill   atorvastatin  (LIPITOR ) 80 MG tablet Take 1 tablet (80 mg total) by  mouth at bedtime. 30 tablet 0   Continuous Blood Gluc Receiver (DEXCOM G7 RECEIVER) DEVI Use to check blood sugar 1 each 0   Continuous Glucose Sensor (DEXCOM G6 SENSOR) MISC Change every 10 days 9 each 3   Continuous Glucose Transmitter (DEXCOM G6 TRANSMITTER) MISC Use every 3 months 1 each 3   docusate sodium  (COLACE) 100 MG capsule Take 1 capsule (100 mg total) by mouth 2 (two) times daily. 10 capsule 0   dorzolamide -timolol  (COSOPT ) 22.3-6.8 MG/ML ophthalmic solution Place 1 drop into both eyes 2 (two) times daily.      insulin  aspart (NOVOLOG ) 100 UNIT/ML injection INJECT UP TO 50 UNITS SUBCUTANEOUSLY ONCE DAILY IN INSULIN  PUMP, 20 mL 0   Insulin  Disposable Pump (OMNIPOD 5 DEXG7G6 PODS GEN 5) MISC CHANGE POD EVERY 3 DAYS 10 each 3   Insulin  Disposable Pump (OMNIPOD 5 G6 INTRO, GEN 5,) KIT Inject into the skin.     Insulin  Pen Needle 32G X 4 MM MISC Use as instructed to administer insulin  4-5X daily. E10.65 400 each 1   lisinopril  (ZESTRIL ) 2.5 MG tablet Take 1 tablet by mouth once daily 90 tablet 0   Multiple Vitamins-Minerals (MULTIVITAMIN ADULTS 50+) TABS Take 1 tablet by mouth daily.     polyethylene glycol (MIRALAX  / GLYCOLAX ) 17 g packet Take 17 g by mouth daily. 14 each 0   amoxicillin -clavulanate (AUGMENTIN ) 875-125 MG tablet Take 1 tablet by mouth 2 (two) times daily. 20 tablet 0   brimonidine  (ALPHAGAN ) 0.15 % ophthalmic solution Place 1 drop into both eyes 2 (two) times daily. (Patient not taking: Reported on 09/30/2024)     NOVOLOG  100 UNIT/ML injection INJECT UP TO 60 UNITS SUBCUTANEOUSLY ONCE DAILY IN  INSULIN   PUMP 50 mL 3   No current facility-administered medications on file prior to visit.    BP (!) 170/100   Pulse 81   Temp 97.9 F (36.6 C) (Oral)   Ht 6' 3 (1.905 m)   Wt 210 lb (95.3 kg)   SpO2 97%   BMI 26.25 kg/m       Objective:   Physical Exam Vitals and nursing note reviewed.  Constitutional:      Appearance: Normal appearance.  Cardiovascular:      Rate and Rhythm: Normal rate and regular rhythm.     Pulses: Normal pulses.     Heart sounds: Normal heart sounds.  Pulmonary:     Effort: Pulmonary effort is normal.     Breath sounds: Normal breath sounds.  Musculoskeletal:        General: Normal  range of motion.  Skin:    General: Skin is warm and dry.  Neurological:     General: No focal deficit present.     Mental Status: He is alert and oriented to person, place, and time.  Psychiatric:        Mood and Affect: Mood normal.        Behavior: Behavior normal.        Thought Content: Thought content normal.        Judgment: Judgment normal.        Assessment & Plan:  1. Cognitive impairment (Primary) - Do to worsenign symptoms will refer to neurology as well and Neuropsych for formal testing  - Ambulatory referral to Neurology - Ambulatory referral to Neuropsychology  2. Hypertension, unspecified type - Not at goal today. He is unsure if he took his medication today.   3. Type 1 diabetes mellitus with unspecified complications (HCC) - Follow up with endocrinology as directed  Darleene Shape, NP

## 2024-10-01 ENCOUNTER — Other Ambulatory Visit: Payer: Self-pay

## 2024-10-01 ENCOUNTER — Other Ambulatory Visit (HOSPITAL_BASED_OUTPATIENT_CLINIC_OR_DEPARTMENT_OTHER): Payer: Self-pay

## 2024-10-01 ENCOUNTER — Emergency Department (HOSPITAL_BASED_OUTPATIENT_CLINIC_OR_DEPARTMENT_OTHER)
Admission: EM | Admit: 2024-10-01 | Discharge: 2024-10-01 | Disposition: A | Discharge status: Home / Self Care | Attending: Emergency Medicine | Admitting: Emergency Medicine

## 2024-10-01 ENCOUNTER — Encounter (HOSPITAL_BASED_OUTPATIENT_CLINIC_OR_DEPARTMENT_OTHER): Payer: Self-pay

## 2024-10-01 DIAGNOSIS — Z794 Long term (current) use of insulin: Secondary | ICD-10-CM | POA: Diagnosis not present

## 2024-10-01 DIAGNOSIS — E1165 Type 2 diabetes mellitus with hyperglycemia: Secondary | ICD-10-CM | POA: Diagnosis not present

## 2024-10-01 DIAGNOSIS — E11621 Type 2 diabetes mellitus with foot ulcer: Secondary | ICD-10-CM | POA: Diagnosis not present

## 2024-10-01 DIAGNOSIS — L97521 Non-pressure chronic ulcer of other part of left foot limited to breakdown of skin: Secondary | ICD-10-CM | POA: Insufficient documentation

## 2024-10-01 DIAGNOSIS — I10 Essential (primary) hypertension: Secondary | ICD-10-CM | POA: Diagnosis not present

## 2024-10-01 DIAGNOSIS — L97529 Non-pressure chronic ulcer of other part of left foot with unspecified severity: Secondary | ICD-10-CM | POA: Diagnosis not present

## 2024-10-01 LAB — CBG MONITORING, ED: Glucose-Capillary: 120 mg/dL — ABNORMAL HIGH (ref 70–99)

## 2024-10-01 MED ORDER — CEPHALEXIN 500 MG PO CAPS: 500.0000 mg | ORAL_CAPSULE | Freq: Four times a day (QID) | ORAL | 0 refills | Status: DC

## 2024-10-01 MED ORDER — CEPHALEXIN 500 MG PO CAPS
500.0000 mg | ORAL_CAPSULE | Freq: Four times a day (QID) | ORAL | 0 refills | Status: AC
  Filled 2024-10-01: qty 40, 10d supply, fill #0

## 2024-10-01 MED ORDER — SILVER SULFADIAZINE 1 % EX CREA
1.0000 | TOPICAL_CREAM | Freq: Every day | CUTANEOUS | 0 refills | Status: DC
  Filled 2024-10-01: qty 25, 25d supply, fill #0

## 2024-10-01 MED ORDER — SILVER SULFADIAZINE 1 % EX CREA: 1.0000 | TOPICAL_CREAM | Freq: Every day | CUTANEOUS | 0 refills | Status: DC

## 2024-10-01 NOTE — ED Provider Notes (Signed)
 Christopher [name] EMERGENCY DEPARTMENT AT [facility] Provider Note   CSN: [id number] Arrival date & time: 10/01/24  9163     Patient presents with: Wound Check   Christopher [name] is a 78 y.o. male who primarily presents today out of concern for a ulceration on the plantar surface of his left foot.  He has a history of type 2 diabetes and notes that he does have decree sensation on the plantar surface of the feet bilaterally.  Has had no prior history of diabetic foot ulcers, wife is cleaning this site with antiseptics at home and applied basic dressing, concerned that this may develop into worsening wound and brought him for evaluation today.  He denies having any chest pain, shortness of breath, weakness or dizziness, denies any nausea or vomiting.  Denies noting any purulent exudate from the wound.  Primary concern was that they noticed bleeding on the plantar surface of the left foot as he was walking after getting out of bed this morning.    Wound Check       Prior to Admission medications   Medication Sig Start Date End Date Taking? Authorizing Provider  amoxicillin -clavulanate (AUGMENTIN ) 875-125 MG tablet Take 1 tablet by mouth 2 (two) times daily. 06/30/24   Nafziger, Darleene, NP  atorvastatin  (LIPITOR ) 80 MG tablet Take 1 tablet (80 mg total) by mouth at bedtime. 06/09/24   Swinyer, Rosaline HERO, NP  brimonidine  (ALPHAGAN ) 0.15 % ophthalmic solution Place 1 drop into both eyes 2 (two) times daily. Patient not taking: Reported on 09/30/2024    [provider]  cephALEXin  (KEFLEX ) 500 MG capsule Take 1 capsule (500 mg total) by mouth 4 (four) times daily for 10 days. 10/01/24 10/11/24  Myriam Dorn BROCKS, PA  Continuous Blood Gluc Receiver (DEXCOM G7 RECEIVER) DEVI Use to check blood sugar 11/19/22   Von Pacific, MD  Continuous Glucose Sensor (DEXCOM G6 SENSOR) MISC Change every 10 days 08/03/24   Thapa, [place], MD  Continuous Glucose Transmitter (DEXCOM G6 TRANSMITTER) MISC  Use every 3 months 08/03/24   Thapa, [place], MD  docusate sodium  (COLACE) 100 MG capsule Take 1 capsule (100 mg total) by mouth 2 (two) times daily. 12/10/22   Rosario Leatrice FERNS, MD  dorzolamide -timolol  (COSOPT ) 22.3-6.8 MG/ML ophthalmic solution Place 1 drop into both eyes 2 (two) times daily.  03/03/11   [provider]  insulin  aspart (NOVOLOG ) 100 UNIT/ML injection INJECT UP TO 50 UNITS SUBCUTANEOUSLY ONCE DAILY IN INSULIN  PUMP, 07/14/24   Thapa, [place], MD  Insulin  Disposable Pump (OMNIPOD 5 DEXG7G6 PODS GEN 5) MISC CHANGE POD EVERY 3 DAYS 06/10/24   Thapa, [place], MD  Insulin  Disposable Pump (OMNIPOD 5 G6 INTRO, GEN 5,) KIT Inject into the skin. 12/10/22   [provider]  Insulin  Pen Needle 32G X 4 MM MISC Use as instructed to administer insulin  4-5X daily. E10.65 10/29/22   Von Pacific, MD  lisinopril  (ZESTRIL ) 2.5 MG tablet Take 1 tablet by mouth once daily 09/15/24   Nafziger, Darleene, NP  Multiple Vitamins-Minerals (MULTIVITAMIN ADULTS 50+) TABS Take 1 tablet by mouth daily.    [provider]  NOVOLOG  100 UNIT/ML injection INJECT UP TO 60 UNITS SUBCUTANEOUSLY ONCE DAILY IN  INSULIN   PUMP 08/03/24   Thapa, [place], MD  polyethylene glycol (MIRALAX  / GLYCOLAX ) 17 g packet Take 17 g by mouth daily. 12/11/22   Ogbata, Sylvester I, MD  silver sulfADIAZINE (SILVADENE) 1 % cream Apply 1 Application topically daily. 10/01/24   Myriam Dorn BROCKS,  PA    Allergies: Patient has no known allergies.    Review of Systems  Skin:  Positive for wound.  All other systems reviewed and are negative.   Updated Vital Signs BP (!) 148/81   Pulse 72   Temp 97.9 F (36.6 C) (Oral)   Resp 18   SpO2 99%   Physical Exam Vitals and nursing note reviewed.  Constitutional:      General: He is awake. He is not in acute distress.    Appearance: Normal appearance. He is well-developed, well-groomed and normal weight. He is not ill-appearing or toxic-appearing.  HENT:     Head:  Normocephalic and atraumatic.     Mouth/Throat:     Mouth: Mucous membranes are moist.     Pharynx: Oropharynx is clear.  Eyes:     Extraocular Movements: Extraocular movements intact.     Conjunctiva/sclera: Conjunctivae normal.     Pupils: Pupils are equal, round, and reactive to light.  Cardiovascular:     Rate and Rhythm: Normal rate and regular rhythm. Frequent Extrasystoles are present.    Pulses:          Dorsalis pedis pulses are 1+ on the right side and 1+ on the left side.       Posterior tibial pulses are 2+ on the right side and 2+ on the left side.     Heart sounds: Normal heart sounds. No murmur heard.    No friction rub. No gallop.  Pulmonary:     Effort: Pulmonary effort is normal.     Breath sounds: Normal breath sounds.  Abdominal:     General: Abdomen is flat. Bowel sounds are normal.     Palpations: Abdomen is soft.  Musculoskeletal:        General: Normal range of motion.     Cervical back: Normal range of motion and neck supple.     Right lower leg: No edema.     Left lower leg: No edema.  Feet:     Right foot:     Skin integrity: Skin integrity normal.     Left foot:     Skin integrity: Ulcer present.  Skin:    General: Skin is warm and dry.     Capillary Refill: Capillary refill takes less than 2 seconds.  Neurological:     General: No focal deficit present.     Mental Status: He is alert and oriented to person, place, and time. Mental status is at baseline.     GCS: GCS eye subscore is 4. GCS verbal subscore is 5. GCS motor subscore is 6.     Sensory: Sensory deficit present.     Comments: Noted decrease sensation to the plantar surface of the feet bilaterally, does not have to point discrimination, does not have sharp or dull sensation bilaterally to the plantar surface of the feet, does have intact sensation to the dorsum of the bilateral feet.  Psychiatric:        Mood and Affect: Mood normal.        Behavior: Behavior is cooperative.     (all  labs ordered are listed, but only abnormal results are displayed) Labs Reviewed  CBG MONITORING, ED - Abnormal; Notable for the following components:      Result Value   Glucose-Capillary 120 (*)    All other components within normal limits    EKG: EKG Interpretation Date/Time:  Friday October 01 2024 09:33:11 EDT Ventricular Rate:  84 PR Interval:  187 QRS Duration:  97 QT Interval:  425 QTC Calculation: 429 R Axis:   -64  Text Interpretation: Sinus rhythm Ventricular bigeminy Left anterior fascicular block Consider anterior infarct Nonspecific T abnormalities, inferior leads No significant change since last tracing Confirmed by Patsey Lot [phone]) on 10/01/2024 9:46:57 AM  Radiology: No results found.   Procedures   Medications Ordered in the ED - No data to display                                  Medical Decision Making Risk Prescription drug management.   Medical Decision Making:   Christopher [name] is a 78 y.o. male who presented to the ED today with concerns for foot wound detailed above.    Additional history discussed with patient's family/caregivers.  External chart has been reviewed including previous labs, imaging, outpatient cardiology and primary care notes. Patient placed on continuous vitals and telemetry monitoring while in ED which was reviewed periodically.  Complete initial physical exam performed, notably the patient  was alert and oriented in no apparent distress.  Notable physical exam finding of a small, approximately 1 cm round ulceration to the plantar surface of the left foot.   Also noted frequent PVCs on cardiac monitoring.  Reviewed and confirmed nursing documentation for past medical history, family history, social history.    Initial Assessment:   With the patient's presentation of foot wound, most likely diagnosis is diabetic foot wound. Other diagnoses were considered including (but not limited to) osteomyelitis, cellulitis, deep  space infection. These are considered less likely due to history of present illness and physical exam findings.     Initial Plan:  With history of type 2 diabetes, obtain spot glucose. With initial bradycardia noted on presentation to triage obtain twelve-lead EKG. As at present there are no concerning signs of deeper infection, defer imaging at this time. Objective evaluation as below reviewed   Initial Study Results:   Laboratory  All laboratory results reviewed without evidence of clinically relevant pathology.   Exceptions include: None  EKG EKG was reviewed independently. Rate, rhythm, axis, intervals all examined. ST segments without concerns for elevations.  Noted frequent PVCs, reviewed previous EKG and shows present in July 2025, reviewed by cardiology at that time.  Reassessment and Plan:   Given history of type 2 diabetes, also likely diabetic neuropathy, this is consistent with a diabetic foot ulceration.  Will begin management with a course of cephalexin  as well as applying Surgicel dressing to the wound, referred to podiatry which patient is already following, provided with silver sulfadiazine for wound care.  Regarding findings on EKG, this is consistent with previous findings that has been previously reviewed with cardiology.  He does not have any weakness, dizziness, chest pain, or any other concerning cardiac symptoms at this time.  As this is consistent with his previous history and does not represent any significant change, I have referred him to follow-up with his cardiology and primary care regarding this finding.  At time of discharge heart rate within normal limits as was blood pressure.       Final diagnoses:  Foot ulcer, left, limited to breakdown of skin [facility])    ED Discharge Orders          Ordered    cephALEXin  (KEFLEX ) 500 MG capsule  4 times daily,   Status:  Discontinued        10/01/24 9048  silver sulfADIAZINE (SILVADENE) 1 % cream  Daily,    Status:  Discontinued        10/01/24 0952    cephALEXin  (KEFLEX ) 500 MG capsule  4 times daily        10/01/24 1000    silver sulfADIAZINE (SILVADENE) 1 % cream  Daily        10/01/24 1000               Myriam Dorn BROCKS, [place] 10/01/24 1045    Patsey Lot, MD 10/02/24 [phone]

## 2024-10-01 NOTE — ED Triage Notes (Addendum)
 Patient woke up with left foot bleeding. It is currently under control with bandage from home. Very slight shadowing noted. He is diabetic.   Patients HR is 37, this was confirmed with radial pulse for full minute. He says it runs low and it was low when he was at the doctor yesterday.   Blood glucose is 120

## 2024-10-01 NOTE — Discharge Instructions (Addendum)
 Also follow-up with podiatry for monitoring of diabetic foot wound.

## 2024-10-01 NOTE — ED Notes (Signed)

## 2024-10-02 DIAGNOSIS — L97521 Non-pressure chronic ulcer of other part of left foot limited to breakdown of skin: Secondary | ICD-10-CM | POA: Diagnosis not present

## 2024-10-04 ENCOUNTER — Ambulatory Visit

## 2024-10-04 ENCOUNTER — Ambulatory Visit (INDEPENDENT_AMBULATORY_CARE_PROVIDER_SITE_OTHER)

## 2024-10-04 ENCOUNTER — Encounter: Payer: Self-pay | Admitting: Podiatry

## 2024-10-04 DIAGNOSIS — E08621 Diabetes mellitus due to underlying condition with foot ulcer: Secondary | ICD-10-CM

## 2024-10-04 DIAGNOSIS — L97422 Non-pressure chronic ulcer of left heel and midfoot with fat layer exposed: Secondary | ICD-10-CM

## 2024-10-04 DIAGNOSIS — E1140 Type 2 diabetes mellitus with diabetic neuropathy, unspecified: Secondary | ICD-10-CM

## 2024-10-04 DIAGNOSIS — E1149 Type 2 diabetes mellitus with other diabetic neurological complication: Secondary | ICD-10-CM

## 2024-10-04 NOTE — Progress Notes (Deleted)
 Cardiology Clinic Note   Patient Name: GORGE ALMANZA Date of Encounter: 10/04/2024  Primary Care Provider:  Merna Huxley, NP Primary Cardiologist:  Madonna Large, DO  Patient Profile    WINSON EICHORN 78 year old male presents to the clinic today for evaluation of his low heart rates.  Past Medical History    Past Medical History:  Diagnosis Date   Diabetes mellitus type II    GERD (gastroesophageal reflux disease)    Glaucoma    Hyperlipidemia    Hypertension    Past Surgical History:  Procedure Laterality Date   COLONOSCOPY     HERNIA REPAIR     PATELLA FRACTURE SURGERY  1993    Allergies  No Known Allergies  History of Present Illness    ARVELL PULSIFER has a PMH of hypertension, TAA, GERD, type 2 diabetes, DKA, AKI, hyperlipidemia, chest pain, elevated troponins, and glaucoma.  Patient is formerly a patient of Dr.Varanasi.  12/23 he had an episode of DKA and AKI.  His serum creatinine increased to 3.3.  At that time his cardiac troponins were checked and were mildly elevated.  Demand ischemia was felt to be the cause.  He was referred to cardiology for further evaluation and management.  He underwent echocardiogram and stress testing which showed low risk and low normal LVEF.  He was last seen by Dr.Varanasi 4/20.    He was seen in follow-up by Dr. Large on 07/14/2024.  During that time his blood pressure was well-controlled.  He denied chest pain and heart failure symptoms.  He denied hospitalizations or urgent care visits.  He had been compliant with his medications.  He denied significant weight gain.  He continued to be somewhat physically active.  He followed up with his PCP who instructed him to follow-up with cardiology due to his low heart rate.  He presents to the clinic today for follow-up evaluation and states***.  *** denies chest pain, shortness of breath, lower extremity edema, fatigue, palpitations, melena, hematuria, hemoptysis, diaphoresis,  weakness, presyncope, syncope, orthopnea, and PND.   Decreased heart rate-EKG today shows***.  Denies recent episodes of lightheadedness, presyncope or syncope. Order 14-day cardiac event monitor Order CBC, BMP, magnesium  Essential hypertension-BP today***. Maintain blood pressure log Heart healthy low-sodium diet Continue lisinopril   Hyperlipidemia-LDL***. Continue atorvastatin  High-fiber diet Increase physical activity as tolerated  Disposition: Follow-up with Dr. Large or me in around 8 weeks.   Home Medications    Prior to Admission medications   Medication Sig Start Date End Date Taking? Authorizing Provider  amoxicillin -clavulanate (AUGMENTIN ) 875-125 MG tablet Take 1 tablet by mouth 2 (two) times daily. 06/30/24   Nafziger, Huxley, NP  atorvastatin  (LIPITOR ) 80 MG tablet Take 1 tablet (80 mg total) by mouth at bedtime. 06/09/24   Swinyer, Rosaline HERO, NP  brimonidine  (ALPHAGAN ) 0.15 % ophthalmic solution Place 1 drop into both eyes 2 (two) times daily. Patient not taking: Reported on 09/30/2024    [provider]  cephALEXin  (KEFLEX ) 500 MG capsule Take 1 capsule (500 mg total) by mouth 4 (four) times daily for 10 days. 10/01/24 10/11/24  Myriam Dorn BROCKS, PA  Continuous Blood Gluc Receiver (DEXCOM G7 RECEIVER) DEVI Use to check blood sugar 11/19/22   Von [place], MD  Continuous Glucose Sensor (DEXCOM G6 SENSOR) MISC Change every 10 days 08/03/24   Thapa, [place], MD  Continuous Glucose Transmitter (DEXCOM G6 TRANSMITTER) MISC Use every 3 months 08/03/24   Thapa, [place], MD  docusate sodium  (COLACE)  100 MG capsule Take 1 capsule (100 mg total) by mouth 2 (two) times daily. 12/10/22   Rosario Leatrice FERNS, MD  dorzolamide -timolol  (COSOPT ) 22.3-6.8 MG/ML ophthalmic solution Place 1 drop into both eyes 2 (two) times daily.  03/03/11   [provider]  insulin  aspart (NOVOLOG ) 100 UNIT/ML injection INJECT UP TO 50 UNITS SUBCUTANEOUSLY ONCE DAILY IN INSULIN  PUMP, 07/14/24    Thapa, [place], MD  Insulin  Disposable Pump (OMNIPOD 5 DEXG7G6 PODS GEN 5) MISC CHANGE POD EVERY 3 DAYS 06/10/24   Thapa, [place], MD  Insulin  Disposable Pump (OMNIPOD 5 G6 INTRO, GEN 5,) KIT Inject into the skin. 12/10/22   [provider]  Insulin  Pen Needle 32G X 4 MM MISC Use as instructed to administer insulin  4-5X daily. E10.65 10/29/22   Von Pacific, MD  lisinopril  (ZESTRIL ) 2.5 MG tablet Take 1 tablet by mouth once daily 09/15/24   Nafziger, Darleene, NP  Multiple Vitamins-Minerals (MULTIVITAMIN ADULTS 50+) TABS Take 1 tablet by mouth daily.    [provider]  NOVOLOG  100 UNIT/ML injection INJECT UP TO 60 UNITS SUBCUTANEOUSLY ONCE DAILY IN  INSULIN   PUMP 08/03/24   Thapa, [place], MD  polyethylene glycol (MIRALAX  / GLYCOLAX ) 17 g packet Take 17 g by mouth daily. 12/11/22   Ogbata, Sylvester I, MD  silver sulfADIAZINE (SILVADENE) 1 % cream Apply 1 Application topically daily. 10/01/24   Myriam Dorn BROCKS, PA    Family History    Family History  Problem Relation Age of Onset   Alcohol abuse Father    Diabetes Sister    Diabetes Brother    Diabetes Brother    Colon cancer Neg Hx    Esophageal cancer Neg Hx    Stomach cancer Neg Hx    Rectal cancer Neg Hx    He indicated that his mother is deceased. He indicated that his father is deceased. He indicated that his sister is alive. He indicated that both of his brothers are deceased. He indicated that his daughter is alive. He indicated that the status of his neg hx is unknown.  Social History    Social History   Socioeconomic History   Marital status: Married    Spouse name: Not on file   Number of children: Not on file   Years of education: Not on file   Highest education level: Not on file  Occupational History   Not on file  Tobacco Use   Smoking status: Never   Smokeless tobacco: Never  Vaping Use   Vaping status: Never Used  Substance and Sexual Activity   Alcohol use: No   Drug use: No   Sexual activity:  Not on file  Other Topics Concern   Not on file  Social History Narrative   He enjoys playing golf    Social Drivers of Health   Financial Resource Strain: Low Risk  (09/01/2024)   Overall Financial Resource Strain (CARDIA)    Difficulty of Paying Living Expenses: Not hard at all  Food Insecurity: No Food Insecurity (09/01/2024)   Hunger Vital Sign    Worried About Running Out of Food in the Last Year: Never true    Ran Out of Food in the Last Year: Never true  Transportation Needs: No Transportation Needs (09/01/2024)   PRAPARE - Administrator, Civil Service (Medical): No    Lack of Transportation (Non-Medical): No  Physical Activity: Inactive (09/01/2024)   Exercise Vital Sign    Days of Exercise per Week: 0 days  Minutes of Exercise per Session: 0 min  Stress: No Stress Concern Present (09/01/2024)   [name]    Feeling of Stress: Not at all  Social Connections: Socially Integrated (09/01/2024)   Social Connection and Isolation Panel    Frequency of Communication with Friends and Family: More than three times a week    Frequency of Social Gatherings with Friends and Family: More than three times a week    Attends Religious Services: More than 4 times per year    Active Member of [employer] or Organizations: Yes    Attends Engineer, structural: More than 4 times per year    Marital Status: Married  Catering manager Violence: Not At Risk (09/01/2024)   Humiliation, Afraid, Rape, and Kick questionnaire    Fear of Current or Ex-Partner: No    Emotionally Abused: No    Physically Abused: No    Sexually Abused: No     Review of Systems    General:  No chills, fever, night sweats or weight changes.  Cardiovascular:  No chest pain, dyspnea on exertion, edema, orthopnea, palpitations, paroxysmal nocturnal dyspnea. Dermatological: No rash, lesions/masses Respiratory: No cough, dyspnea Urologic: No  hematuria, dysuria Abdominal:   No nausea, vomiting, diarrhea, bright red blood per rectum, melena, or hematemesis Neurologic:  No visual changes, wkns, changes in mental status. All other systems reviewed and are otherwise negative except as noted above.  Physical Exam    VS:  There were no vitals taken for this visit. , BMI There is no height or weight on file to calculate BMI. GEN: Well nourished, well developed, in no acute distress. HEENT: normal. Neck: Supple, no JVD, carotid bruits, or masses. Cardiac: RRR, no murmurs, rubs, or gallops. No clubbing, cyanosis, edema.  Radials/DP/PT 2+ and equal bilaterally.  Respiratory:  Respirations regular and unlabored, clear to auscultation bilaterally. GI: Soft, nontender, nondistended, BS + x 4. MS: no deformity or atrophy. Skin: warm and dry, no rash. Neuro:  Strength and sensation are intact. Psych: Normal affect.  Accessory Clinical Findings    Recent Labs: 12/02/2023: Hemoglobin 13.8; Platelets 230 06/08/2024: BUN 18; Creatinine, Ser 1.57; Potassium 4.1; Sodium 140; TSH 1.02   Recent Lipid Panel    Component Value Date/Time   CHOL 214 (H) 03/20/2023 0832   TRIG 67.0 03/20/2023 0832   HDL 51.20 03/20/2023 0832   CHOLHDL 4 03/20/2023 0832   VLDL 13.4 03/20/2023 0832   LDLCALC 149 (H) 03/20/2023 0832   LDLDIRECT 135.3 06/17/2013 0838    No BP recorded.  {Refresh Note OR Click here to enter BP  :1}***    ECG personally reviewed by me today- ***     Echocardiogram: 12/04/22 1. Left ventricular ejection fraction, by estimation, is 50 to 55%. The  left ventricle has low normal function. The left ventricle has no regional  wall motion abnormalities. There is moderate left ventricular hypertrophy.  Left ventricular diastolic  parameters are consistent with Grade I diastolic dysfunction (impaired  relaxation).   2. Right ventricular systolic function is mildly reduced. The right  ventricular size is normal. There is normal  pulmonary artery systolic  pressure. The estimated right ventricular systolic pressure is 21.4 mmHg.   3. The mitral valve is abnormal. Mild mitral valve regurgitation.   4. The aortic valve is tricuspid. Aortic valve regurgitation is not  visualized. Aortic valve sclerosis is present, with no evidence of aortic  valve stenosis.   5. The inferior  vena cava is dilated in size with >50% respiratory  variability, suggesting right atrial pressure of 8 mmHg.    Stress Testing: Lexiscan  Myoview  12/17/22   Findings are consistent with no prior ischemia and no prior myocardial infarction. The study is low risk.   No ST deviation was noted.   Left ventricular function is abnormal. Global function is mildly reduced. Nuclear stress EF: 50 %. The left ventricular ejection fraction is mildly decreased (45-54%). End diastolic cavity size is mildly enlarged. End systolic cavity size is normal.   Prior study not available for comparison.   Negative stress test. Low normal ejection fraction.     Assessment & Plan   1.  ***   Josefa HERO. Rilyn Upshaw NP-C     10/04/2024, 1:20 PM [facility] [address] [place], [place] [address] Office [phone]    Notice: This dictation was prepared with Dragon dictation along with smaller phrase technology. Any transcriptional errors that result from this process are unintentional and may not be corrected upon review.   I spent***minutes examining this patient, reviewing medications, and using patient centered shared decision making involving their cardiac care.   I spent  20 minutes reviewing past medical history,  medications, and prior cardiac tests.

## 2024-10-04 NOTE — Progress Notes (Signed)
   Chief Complaint  Patient presents with   Diabetic Ulcer    Pt is here due to wound on the bottom of the left foot, states noticed it a week ago, was seen at the ER for this issue, states it does not hurt and recent drainage from site.    Subjective:  78 y.o. male with PMHx of diabetes mellitus presenting today for evaluation of ulcer to the left foot.  Seen at the emergency department and referred here for outpatient follow-up.  Lab Results  Component Value Date   HGBA1C 6.7 (A) 08/03/2024   HGBA1C 6.7 (A) 02/09/2024   HGBA1C 6.6 (A) 11/07/2023    Past Medical History:  Diagnosis Date   Diabetes mellitus type II    GERD (gastroesophageal reflux disease)    Glaucoma    Hyperlipidemia    Hypertension     Past Surgical History:  Procedure Laterality Date   COLONOSCOPY     HERNIA REPAIR     PATELLA FRACTURE SURGERY  1993    No Known Allergies   LT foot 10/04/2024   Objective/Physical Exam General: The patient is alert and oriented x3 in no acute distress.  Dermatology:  Wound #1 noted to the plantar aspect of the left foot measuring approximately 0.7 x 0.7 x 0.2 cm (LxWxD).   To the noted ulceration(s), there is no eschar. There is a moderate amount of slough, fibrin, and necrotic tissue noted. Granulation tissue and wound base is red. There is a minimal amount of serosanguineous drainage noted. There is no exposed bone muscle-tendon ligament or joint. There is no malodor. Periwound integrity is intact. Skin is warm, dry and supple bilateral lower extremities.  Vascular: Palpable pedal pulses bilaterally. No edema or erythema noted. Capillary refill within normal limits.  Neurological: Light touch and protective threshold absent  Musculoskeletal Exam: Range of motion within normal limits to all pedal and ankle joints bilateral. Muscle strength 5/5 in all groups bilateral.   Radiographic exam LT foot 10/04/2024: Normal osseous mineralization.  No erosions concerning  for underlying osteomyelitis.  Vascular calcifications noted diffusely throughout the forefoot and digits  Assessment: 1.  Ulcer left foot secondary to diabetes mellitus 2. diabetes mellitus w/ peripheral neuropathy   Plan of Care:  -Patient was evaluated.  X-rays reviewed -Medically necessary excisional debridement including subcutaneous tissue was performed using a tissue nipper and a chisel blade. Excisional debridement of all the necrotic nonviable tissue down to healthy bleeding viable tissue was performed with post-debridement measurements same as pre-. -The wound was cleansed and dry sterile dressing applied. -He has a current prescription for Keflex  500 mg QID x 10 days.  Continue until complete.  End date 10/11/2024 -Continue Silvadene cream with a light dressing -Return to clinic 2 weeks   Thresa EMERSON Sar, DPM [facility]  Dr. Thresa EMERSON Sar, DPM    [address] [place], [place] [address]                Office [phone]  Fax [phone]

## 2024-10-05 ENCOUNTER — Ambulatory Visit: Admitting: General Practice

## 2024-10-05 NOTE — Progress Notes (Deleted)
   Cardiology Office Note    Date:  10/05/2024  ID:  Christopher [name], Christopher [name] 09/14/1946, MRN [id number] PCP:  Merna Huxley, NP  Cardiologist:  Madonna Large, DO  Electrophysiologist:  None   Chief Complaint: ***  History of Present Illness: .    Christopher [name] is a 78 y.o. male with visit-pertinent history of HTN, HLD managed by PCP, CKD 3a, DM type 1?, prior DKA seen for evaluation of low heart rates. The patient was previously seen by Dr. Dann then transitioned to Dr. Large. He had a prior elevated troponin peak of 4,389 while in the hospital 11/2022 in setting of acute renal failure and DKA. Echocardiogram had shown EF 50-55%, moderate LVH, G1DD, mildly reduced RV function, mild MR, dilated IVC. Stress test 12/2022 showed no prior ischemia and no prior myocardial infarction, low risk study.  Elevated troponin felt to be demand ischemia. He was recently in the ER 10/01/24 with diabetic foot ulceration, also found to have frequent PVCs.    Frequent PVCs Essential HTN HLD Elevated troponin   Labwork independently reviewed: 05/2024 TSH ok, K 4.1, Cr 1.57 11/2023 Hgb 13.8, plt ok 03/2023 LDL 149, trig 67 (PCP)  ROS: .    Please see the history of present illness. Otherwise, review of systems is positive for ***.  All other systems are reviewed and otherwise negative.  Studies Reviewed: SABRA    EKG:  EKG is ordered today, personally reviewed, demonstrating ***  CV Studies: Cardiac studies reviewed are outlined and summarized above. Otherwise please see EMR for full report.   Current Reported Medications:.    No outpatient medications have been marked as taking for the 10/06/24 encounter (Appointment) with Brach Birdsall N, PA-C.    Physical Exam:    VS:  There were no vitals taken for this visit.   Wt Readings from Last 3 Encounters:  10/04/24 210 lb (95.3 kg)  09/30/24 210 lb (95.3 kg)  09/01/24 210 lb 1.6 oz (95.3 kg)    GEN: Well nourished, well developed in no acute  distress NECK: No JVD; No carotid bruits CARDIAC: ***RRR, no murmurs, rubs, gallops RESPIRATORY:  Clear to auscultation without rales, wheezing or rhonchi  ABDOMEN: Soft, non-tender, non-distended EXTREMITIES:  No edema; No acute deformity   Asessement and Plan:.     ***     Disposition: F/u with ***  Signed, Temima Kutsch N Hanako Tipping, PA-C

## 2024-10-06 ENCOUNTER — Ambulatory Visit: Admitting: Physician Assistant

## 2024-10-06 ENCOUNTER — Telehealth: Payer: Self-pay

## 2024-10-06 ENCOUNTER — Other Ambulatory Visit: Payer: Self-pay | Admitting: Endocrinology

## 2024-10-06 DIAGNOSIS — E109 Type 1 diabetes mellitus without complications: Secondary | ICD-10-CM

## 2024-10-06 DIAGNOSIS — I493 Ventricular premature depolarization: Secondary | ICD-10-CM

## 2024-10-06 DIAGNOSIS — I10 Essential (primary) hypertension: Secondary | ICD-10-CM

## 2024-10-06 DIAGNOSIS — E785 Hyperlipidemia, unspecified: Secondary | ICD-10-CM

## 2024-10-06 DIAGNOSIS — R7989 Other specified abnormal findings of blood chemistry: Secondary | ICD-10-CM

## 2024-10-06 NOTE — Telephone Encounter (Signed)
 Copied from CRM [id number]. Topic: Referral - Question >> Oct 05, 2024  2:45 PM Nessti S wrote: Reason for CRM: patient wife hazel called to see if he is able to receive referral to [facility] [phone]. Previous location dont have appts until February 2026. [name] call back number [phone]

## 2024-10-06 NOTE — Telephone Encounter (Signed)
 Advised hazel to make sure they have new pt appt. For neuro earlier than Feb. Before any changes are made. [name] will contact them and call us  back with update.

## 2024-10-15 ENCOUNTER — Ambulatory Visit: Admitting: Podiatry

## 2024-10-18 ENCOUNTER — Telehealth: Payer: Self-pay | Admitting: Adult Health

## 2024-10-18 ENCOUNTER — Ambulatory Visit: Admitting: Podiatry

## 2024-10-18 ENCOUNTER — Encounter: Payer: Self-pay | Admitting: Podiatry

## 2024-10-18 VITALS — Ht 75.0 in | Wt 210.0 lb

## 2024-10-18 DIAGNOSIS — B351 Tinea unguium: Secondary | ICD-10-CM | POA: Diagnosis not present

## 2024-10-18 DIAGNOSIS — M79674 Pain in right toe(s): Secondary | ICD-10-CM | POA: Diagnosis not present

## 2024-10-18 DIAGNOSIS — M79675 Pain in left toe(s): Secondary | ICD-10-CM | POA: Diagnosis not present

## 2024-10-18 NOTE — Telephone Encounter (Unsigned)
 Copied from CRM [id number]. Topic: Referral - Request for Referral >> Oct 18, 2024  9:02 AM Zy'onna H wrote: Did the patient discuss referral with their provider in the last year? Yes (If No - schedule appointment) (If Yes - send message)  Appointment offered? Yes  Type of order/referral and detailed reason for visit: Wife called in stating the patient is having trouble remembering her, remembering certain things.  **A previous referral was already sent for Neurology but the patient stated it was sent to the incorrect Clinic.** (Referral [id number])  ** They have requested to be seen prior to February - requesting an appt. ASAP**   Preference of office, provider, location: [facility] [address], [place], [place] [address] Phone: [phone]    If referral order, have you been seen by this specialty before? No (If Yes, this issue or another issue? When? Where?  Can we respond through MyChart? No Phone Call Requested to follow-up regarding this referral request. (Wife)

## 2024-10-18 NOTE — Progress Notes (Signed)
   Chief Complaint  Patient presents with   Diabetic Ulcer    Pt is here to f/u on diabetic ulcer to the left foot, states no issues from that and [facility].    Subjective:  78 y.o. male with PMHx of diabetes mellitus presenting today for follow-up evaluation to the left plantar forefoot.  Significant improvement.  Also requesting routine nail debridement today  Lab Results  Component Value Date   HGBA1C 6.7 (A) 08/03/2024   HGBA1C 6.7 (A) 02/09/2024   HGBA1C 6.6 (A) 11/07/2023    Past Medical History:  Diagnosis Date   Diabetes mellitus type II    GERD (gastroesophageal reflux disease)    Glaucoma    Hyperlipidemia    Hypertension     Past Surgical History:  Procedure Laterality Date   COLONOSCOPY     HERNIA REPAIR     PATELLA FRACTURE SURGERY  1993    No Known Allergies   LT foot 10/04/2024   Objective/Physical Exam General: The patient is alert and oriented x3 in no acute distress.  Dermatology:  The ulcer to the plantar aspect of the left foot has healed.  Complete reepithelialization has occurred.  No open wounds noted  Hyperkeratotic dystrophic nails noted 1-5 bilateral Skin is warm, dry and supple bilateral lower extremities.  Vascular: Palpable pedal pulses bilaterally. No edema or erythema noted. Capillary refill within normal limits.  Neurological: Light touch and protective threshold absent  Musculoskeletal Exam: Range of motion within normal limits to all pedal and ankle joints bilateral. Muscle strength 5/5 in all groups bilateral.   Radiographic exam LT foot 10/04/2024: Normal osseous mineralization.  No erosions concerning for underlying osteomyelitis.  Vascular calcifications noted diffusely throughout the forefoot and digits  Assessment: 1.  Ulcer left foot secondary to diabetes mellitus; healed 2. diabetes mellitus w/ peripheral neuropathy 3.  Pain due to onychomycosis of toenails both   Plan of Care:  -Patient was evaluated.   - Light  debridement of the callus tissue around the plantar wound performed today.  No open wound noted -Mechanical debridement of nails 1-5 bilateral performed using a nail nipper without incident or bleeding -Continue wearing good supportive tennis shoes and sneakers -Return to clinic 3 months routine footcare  Thresa EMERSON Sar, DPM [facility]  Dr. Thresa EMERSON Sar, DPM    [address] [place], [place] [address]                Office [phone]  Fax [phone]

## 2024-10-20 ENCOUNTER — Telehealth: Payer: Self-pay | Admitting: Adult Health

## 2024-10-20 NOTE — Telephone Encounter (Signed)
 Pt spouse notified that referral coordinator is working on this.

## 2024-10-20 NOTE — Telephone Encounter (Signed)
 Copied from CRM [id number]. Topic: Referral - Status >> Oct 20, 2024  9:41 AM Alfonso ORN wrote: Reason for CRM: pt wife calling back to check on referral status ( asked for nuerology referral to be sent to [facility] as was told can be seen sooner there and pt wife believes it is an emergency situation due to memory loss) please contact pt wife  with update [phone]

## 2024-10-21 DIAGNOSIS — H5203 Hypermetropia, bilateral: Secondary | ICD-10-CM | POA: Diagnosis not present

## 2024-10-25 NOTE — Telephone Encounter (Signed)
 Christopher [name]   10/25/2024  9:36 AM  Patient wife [name] calling asking for update on referral for Neurology wanting it sent to [facility]  phone [phone] who can see patient soon. [name] would like to speak with a supervisor regarding why it is taking so long to resolve this, [name] has called several times.              Patient memory is getting worse    Per crm

## 2024-10-27 ENCOUNTER — Ambulatory Visit: Admitting: Adult Health

## 2024-10-27 NOTE — Telephone Encounter (Signed)
 Per referral coordinator and [facility] they are not accepting new pt for cognitive impairment. Pt spouse notified of update and stated they will stay with [facility].

## 2024-10-28 ENCOUNTER — Encounter: Payer: Self-pay | Admitting: Neurology

## 2024-10-28 ENCOUNTER — Ambulatory Visit: Admitting: Neurology

## 2024-10-28 VITALS — BP 162/102 | Ht 76.0 in | Wt 207.0 lb

## 2024-10-28 DIAGNOSIS — F02A0 Dementia in other diseases classified elsewhere, mild, without behavioral disturbance, psychotic disturbance, mood disturbance, and anxiety: Secondary | ICD-10-CM | POA: Diagnosis not present

## 2024-10-28 DIAGNOSIS — G301 Alzheimer's disease with late onset: Secondary | ICD-10-CM | POA: Diagnosis not present

## 2024-10-28 MED ORDER — DONEPEZIL HCL 5 MG PO TABS: 5.0000 mg | ORAL_TABLET | Freq: Every day | ORAL | 0 refills | Status: DC
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
## 2024-10-29 NOTE — Patient Instructions (Addendum)
 Will obtain dementia lab including B12, TSH and Lumipulse test to look for presence of Alzheimer disease biomarkers.  I will contact you to go over the results Continue current medications Start Aricept 5 mg nightly, side effect of the medication include diarrhea, vivid dreams and dizziness Advised against driving Increase exercise, at least 20 minutes a day 5 days a week Continue the medication Return in a year or sooner if worse   There are well-accepted and sensible ways to reduce risk for Alzheimers disease and other degenerative brain disorders .  Exercise Daily Walk A daily 20 minute walk should be part of your routine. Disease related apathy can be a significant roadblock to exercise and the only way to overcome this is to make it a daily routine and perhaps have a reward at the end (something your loved one loves to eat or drink perhaps) or a personal trainer coming to the home can also be very useful. Most importantly, the patient is much more likely to exercise if the caregiver / spouse does it with him/her. In general a structured, repetitive schedule is best.  General Health: Any diseases which effect your body will effect your brain such as a pneumonia, urinary infection, blood clot, heart attack or stroke. Keep contact with your primary care doctor for regular follow ups.  Sleep. A good nights sleep is healthy for the brain. Seven hours is recommended. If you have insomnia or poor sleep habits we can give you some instructions. If you have sleep apnea wear your mask.  Diet: Eating a heart healthy diet is also a good idea; fish and poultry instead of red meat, nuts (mostly non-peanuts), vegetables, fruits, olive oil or canola oil (instead of butter), minimal salt (use other spices to flavor foods), whole grain rice, bread, cereal and pasta and wine in moderation.Research is now showing that the MIND diet, which is a combination of The Mediterranean diet and the DASH diet, is beneficial  for cognitive processing and longevity. Information about this diet can be found in The MIND Diet, a book by Annitta Feeling, MS, RDN, and online at wildwildscience.es  Finances, Power of [address] and Advance Directives: You should consider putting legal safeguards in place with regard to financial and medical decision making. While the spouse always has power of attorney for medical and financial issues in the absence of any form, you should consider what you want in case the spouse / caregiver is no longer around or capable of making decisions.

## 2024-10-31 NOTE — Progress Notes (Unsigned)
 Cardiology Office Note    Date:  11/01/2024  ID:  Christopher [name], DOB 1946-11-04, MRN [id number] PCP:  Merna Huxley, NP  Cardiologist:  Madonna Large, DO  Electrophysiologist:  None   Chief Complaint: evaluate abnormal heart rate  History of Present Illness: .    AEDAN [name] is a 78 y.o. male with visit-pertinent history of HTN, HLD managed by PCP, mild dementia, CKD 3a, DM type 1?, prior DKA seen for evaluation of abnormal heart rate. The patient was previously seen by Dr. Dann then transitioned to Dr. Large. He had a prior elevated troponin peak of 4,389 while in the hospital 11/2022 in setting of acute renal failure and DKA. Echocardiogram had shown EF 50-55%, moderate LVH, G1DD, mildly reduced RV function, mild MR, dilated IVC. Stress test 12/2022 showed no prior ischemia and no prior myocardial infarction, low risk study.  Elevated troponin felt to be demand ischemia. He was recently in the ER 10/01/24 with diabetic foot ulceration. While in the ED, intake HR was 30s-40s but confirmed to be NSR with frequent PVCs HR in the 80s. He was advised to f/u cardiology. He has also been dealing with high blood pressure over the last month, 170/100 at PCP OV 10/16, 148/81 at ED visit 10/17, 162/102 at neurology OV 11/13 for cognitive impairment. He is seen today with his wife who aids in the history. He has not had any cardiac symptoms at all. No CP, SOB, palpitations, syncope, dizziness, falls, or bleeding. He is in atrial flutter today with frequent PVCs versus abberrant conduction, unaware of this rhythm. Initial BP 181/100, recheck by me 170/96.  Labwork independently reviewed: 05/2024 TSH ok, K 4.1, Cr 1.57 11/2023 Hgb 13.8, plt ok 03/2023 LDL 149, trig 67 (PCP)  ROS: .    Please see the history of present illness.  All other systems are reviewed and otherwise negative.  Studies Reviewed: SABRA    EKG:  EKG is ordered today, personally reviewed, demonstrating    CV Studies: Cardiac  studies reviewed are outlined and summarized above. Otherwise please see EMR for full report.   Current Reported Medications:.    Current Meds  Medication Sig   atorvastatin  (LIPITOR ) 80 MG tablet Take 1 tablet (80 mg total) by mouth at bedtime.   Continuous Glucose Sensor (DEXCOM G6 SENSOR) MISC Change every 10 days   Continuous Glucose Transmitter (DEXCOM G6 TRANSMITTER) MISC Use every 3 months   donepezil (ARICEPT) 5 MG tablet Take 1 tablet (5 mg total) by mouth at bedtime.   insulin  aspart (NOVOLOG ) 100 UNIT/ML injection INJECT UP TO 50 UNITS SUBCUTANEOUSLY ONCE DAILY IN INSULIN  PUMP,   Insulin  Disposable Pump (OMNIPOD 5 DEXG7G6 PODS GEN 5) MISC CHANGE POD EVERY 3 DAYS   Insulin  Disposable Pump (OMNIPOD 5 G6 INTRO, GEN 5,) KIT Inject into the skin.   lisinopril  (ZESTRIL ) 2.5 MG tablet Take 1 tablet by mouth once daily   Multiple Vitamins-Minerals (MULTIVITAMIN ADULTS 50+) TABS Take 1 tablet by mouth daily.   NOVOLOG  100 UNIT/ML injection INJECT UP TO 60 UNITS SUBCUTANEOUSLY ONCE DAILY IN  INSULIN   PUMP    Physical Exam:    VS:  BP (!) 181/101 (BP Location: Right Arm, Patient Position: Sitting, Cuff Size: Normal)   Pulse 92   Resp 16   Ht 6' 4 (1.93 m)   Wt 208 lb 3.2 oz (94.4 kg)   SpO2 98%   BMI 25.34 kg/m    Wt Readings from Last 3 Encounters:  11/01/24  208 lb 3.2 oz (94.4 kg)  10/28/24 207 lb (93.9 kg)  10/18/24 210 lb (95.3 kg)    GEN: Well nourished, well developed in no acute distress NECK: No JVD; No carotid bruits CARDIAC: irregularly irregular, no murmurs, rubs, gallops RESPIRATORY:  Clear to auscultation without rales, wheezing or rhonchi  ABDOMEN: Soft, non-tender, non-distended EXTREMITIES:  No edema; No acute deformity   Asessement and Plan:.    1. Atrial flutter - patient sent to cardiology initially for concern for elevated HR on BP monitor while in ED, confirmed to be NSR 84bpm with frequent PVCs at that time, a phenomenon called bradysphygmia where a  pulse ox or blood pressure monitor may not accurately reflect the HR due to frequent ectopy. However, upon presentation today, he is actually in newly recognized atrial flutter. He has no cardiac awareness of this. I confirmed EKG with Dr. Wonda (DOD). I will arrange 14 day monitor to assess whether atrial flutter is persistent or paroxysmal to help guide strategy for treatment. This will also allow us  to accurately observe true HR trend and PVC burden as well. Check basic labs including CBC, CMET, TSH rfx to free T4, Mg. Arrange 2d echocardiogram. Will start carvedilol 6.25mg  BID (given that low HR was crosschecked with EKG showing normal HR) and Eliquis 5mg  BID. Discussed bleeding precautions with patient and wife. Will have him f/u here in 5-6 weeks to review monitor and echo and discuss strategy for next steps, potentially visit the issue of cardioversion if monitor confirms 100% a-flutter. Addendum: Notified CMA to correct AVS instructions that say to take carvedilol once a day to twice a day. Wife/patient aware this is twice a day.  2. Frequent PVCs - these were noted on EKG even before he developed atrial flutter. Plan monitor, echo, and labs as above. If PVC burden is significant, anticipate referral to EP. Note the monitor will reflect HR trends AFTER starting carvedilol.  3. Essential HTN - 181/100, recheck by me 170/96 - consistent with recent trends even back to a month ago. On low dose lisinopril  2.5mg  daily, no change today. Follow with addition of carvedilol. Asymptomatic.  4. HLD - followed by PCP.  5. Prior elevated troponin - as above during hospitalization in 2023. Stress test reassuring at that time. No chest pain. F/u echo.    Disposition: F/u with me in 5-6 weeks.  Signed, Amauri Medellin N Magdelena Kinsella, PA-C

## 2024-11-01 ENCOUNTER — Encounter: Payer: Self-pay | Admitting: Physician Assistant

## 2024-11-01 ENCOUNTER — Ambulatory Visit: Attending: Physician Assistant | Admitting: Physician Assistant

## 2024-11-01 ENCOUNTER — Ambulatory Visit (INDEPENDENT_AMBULATORY_CARE_PROVIDER_SITE_OTHER)

## 2024-11-01 ENCOUNTER — Other Ambulatory Visit (HOSPITAL_COMMUNITY): Payer: Self-pay

## 2024-11-01 VITALS — BP 170/96 | HR 92 | Resp 16 | Ht 76.0 in | Wt 208.2 lb

## 2024-11-01 DIAGNOSIS — I4892 Unspecified atrial flutter: Secondary | ICD-10-CM

## 2024-11-01 DIAGNOSIS — E785 Hyperlipidemia, unspecified: Secondary | ICD-10-CM

## 2024-11-01 DIAGNOSIS — R7989 Other specified abnormal findings of blood chemistry: Secondary | ICD-10-CM | POA: Diagnosis not present

## 2024-11-01 DIAGNOSIS — I10 Essential (primary) hypertension: Secondary | ICD-10-CM

## 2024-11-01 DIAGNOSIS — I493 Ventricular premature depolarization: Secondary | ICD-10-CM

## 2024-11-01 MED ORDER — APIXABAN 5 MG PO TABS: 5.0000 mg | ORAL_TABLET | Freq: Two times a day (BID) | ORAL | 1 refills | Status: DC

## 2024-11-01 MED ORDER — CARVEDILOL 6.25 MG PO TABS: 6.2500 mg | ORAL_TABLET | Freq: Two times a day (BID) | ORAL | 3 refills | Status: DC

## 2024-11-01 MED ORDER — APIXABAN 5 MG PO TABS
5.0000 mg | ORAL_TABLET | Freq: Two times a day (BID) | ORAL | 0 refills | Status: DC
  Filled 2024-11-01: qty 60, 30d supply, fill #0

## 2024-11-01 NOTE — Patient Instructions (Addendum)
 Medication Instructions:  START Coreg (carvedilol) 6.25mg  Take 1 tablet twice a day  START Eliquis (apixaban) 5mg  Take 1 tablet twice a day  *If you need a refill on your cardiac medications before your next appointment, please call your pharmacy*  Lab Work: TODAY-CBC, CMET, TSH + FREE T4 & MAG If you have labs (blood work) drawn today and your tests are completely normal, you will receive your results only by: MyChart Message (if you have MyChart) OR A paper copy in the mail If you have any lab test that is abnormal or we need to change your treatment, we will call you to review the results.  Testing/Procedures: Your physician has requested that you have an echocardiogram. Echocardiography is a painless test that uses sound waves to create images of your heart. It provides your doctor with information about the size and shape of your heart and how well your heart's chambers and valves are working. This procedure takes approximately one hour. There are no restrictions for this procedure. Please do NOT wear cologne, perfume, aftershave, or lotions (deodorant is allowed). Please arrive 15 minutes prior to your appointment time.  Please note: We ask at that you not bring children with you during ultrasound (echo/ vascular) testing. Due to room size and safety concerns, children are not allowed in the ultrasound rooms during exams. Our front office staff cannot provide observation of children in our lobby area while testing is being conducted. An adult accompanying a patient to their appointment will only be allowed in the ultrasound room at the discretion of the ultrasound technician under special circumstances. We apologize for any inconvenience.  ZIO XT- Long Term Monitor Instructions  Your physician has requested you wear a ZIO patch monitor for 14 days.  This is a single patch monitor. [employer] supplies one patch monitor per enrollment. Additional stickers are not available. Please do not  apply patch if you will be having a Nuclear Stress Test,  Echocardiogram, Cardiac CT, MRI, or Chest Xray during the period you would be wearing the  monitor. The patch cannot be worn during these tests. You cannot remove and re-apply the  ZIO XT patch monitor.  Your ZIO patch monitor will be mailed 3 day USPS to your address on file. It may take 3-5 days  to receive your monitor after you have been enrolled.  Once you have received your monitor, please review the enclosed instructions. Your monitor  has already been registered assigning a specific monitor serial # to you.  Billing and Patient Assistance Program Information  We have supplied [employer] with any of your insurance information on file for billing purposes. [employer] offers a sliding scale Patient Assistance Program for patients that do not have  insurance, or whose insurance does not completely cover the cost of the ZIO monitor.  You must apply for the Patient Assistance Program to qualify for this discounted rate.  To apply, please call [employer] at [phone], select option 4, select option 2, ask to apply for  Patient Assistance Program. Meredeth will ask your household income, and how many people  are in your household. They will quote your out-of-pocket cost based on that information.  [employer] will also be able to set up a 15-month, interest-free payment plan if needed.  Applying the monitor   Shave hair from upper left chest.  Hold abrader disc by orange tab. Rub abrader in 40 strokes over the upper left chest as  indicated in your monitor instructions.  Clean area with 4  enclosed alcohol pads. Let dry.  Apply patch as indicated in monitor instructions. Patch will be placed under collarbone on left  side of chest with arrow pointing upward.  Rub patch adhesive wings for 2 minutes. Remove white label marked 1. Remove the white  label marked 2. Rub patch adhesive wings for 2 additional minutes.  While looking in a mirror,  press and release button in center of patch. A small green light will  flash 3-4 times. This will be your only indicator that the monitor has been turned on.  Do not shower for the first 24 hours. You may shower after the first 24 hours.  Press the button if you feel a symptom. You will hear a small click. Record Date, Time and  Symptom in the Patient Logbook.  When you are ready to remove the patch, follow instructions on the last 2 pages of Patient  Logbook. Stick patch monitor onto the last page of Patient Logbook.  Place Patient Logbook in the blue and white box. Use locking tab on box and tape box closed  securely. The blue and white box has prepaid postage on it. Please place it in the mailbox as  soon as possible. Your physician should have your test results approximately 7 days after the  monitor has been mailed back to [facility].  Call [facility] Customer Care at [phone] if you have questions regarding  your ZIO XT patch monitor. Call them immediately if you see an orange light blinking on your  monitor.  If your monitor falls off in less than 4 days, contact our Monitor department at [phone].  If your monitor becomes loose or falls off after 4 days call [employer] at [phone] for  suggestions on securing your monitor   Follow-Up: At [facility], you and your health needs are our priority.  As part of our continuing mission to provide you with exceptional heart care, our providers are all part of one team.  This team includes your primary Cardiologist (physician) and Advanced Practice Providers or APPs (Physician Assistants and Nurse Practitioners) who all work together to provide you with the care you need, when you need it.  Your next appointment:   6 week(s)  Provider:   Dayna Dunn, PA-C         We recommend signing up for the patient portal called MyChart.  Sign up information is provided on this After Visit Summary.  MyChart is used to  connect with patients for Virtual Visits (Telemedicine).  Patients are able to view lab/test results, encounter notes, upcoming appointments, etc.  Non-urgent messages can be sent to your provider as well.   To learn more about what you can do with MyChart, go to [email or web address].   Other Instructions

## 2024-11-01 NOTE — Progress Notes (Unsigned)
 Enrolled for [employer] to mail a ZIO XT long term holter monitor to the patients address on file.   Dr. Odis Hollingshead to read.

## 2024-11-03 ENCOUNTER — Ambulatory Visit: Payer: Self-pay | Admitting: Neurology

## 2024-11-03 ENCOUNTER — Other Ambulatory Visit: Payer: Self-pay

## 2024-11-03 ENCOUNTER — Telehealth: Payer: Self-pay

## 2024-11-03 ENCOUNTER — Encounter (HOSPITAL_COMMUNITY): Payer: Self-pay | Admitting: Emergency Medicine

## 2024-11-03 ENCOUNTER — Emergency Department (HOSPITAL_COMMUNITY)
Admission: EM | Admit: 2024-11-03 | Discharge: 2024-11-03 | Discharge status: Against Medical Advice | Attending: Emergency Medicine | Admitting: Emergency Medicine

## 2024-11-03 DIAGNOSIS — R739 Hyperglycemia, unspecified: Secondary | ICD-10-CM | POA: Diagnosis not present

## 2024-11-03 DIAGNOSIS — Z5321 Procedure and treatment not carried out due to patient leaving prior to being seen by health care provider: Secondary | ICD-10-CM | POA: Diagnosis not present

## 2024-11-03 DIAGNOSIS — E109 Type 1 diabetes mellitus without complications: Secondary | ICD-10-CM

## 2024-11-03 DIAGNOSIS — E1165 Type 2 diabetes mellitus with hyperglycemia: Secondary | ICD-10-CM | POA: Diagnosis not present

## 2024-11-03 DIAGNOSIS — E1065 Type 1 diabetes mellitus with hyperglycemia: Secondary | ICD-10-CM

## 2024-11-03 LAB — CBC WITH DIFFERENTIAL/PLATELET
Abs Immature Granulocytes: 0.01 K/uL (ref 0.00–0.07)
Basophils Absolute: 0 K/uL (ref 0.0–0.1)
Basophils Relative: 1 %
Eosinophils Absolute: 0.2 K/uL (ref 0.0–0.5)
Eosinophils Relative: 4 %
HCT: 39.3 % (ref 39.0–52.0)
Hemoglobin: 13.9 g/dL (ref 13.0–17.0)
Immature Granulocytes: 0 %
Lymphocytes Relative: 37 %
Lymphs Abs: 1.9 K/uL (ref 0.7–4.0)
MCH: 26.5 pg (ref 26.0–34.0)
MCHC: 35.4 g/dL (ref 30.0–36.0)
MCV: 74.9 fL — ABNORMAL LOW (ref 80.0–100.0)
Monocytes Absolute: 0.6 K/uL (ref 0.1–1.0)
Monocytes Relative: 11 %
Neutro Abs: 2.4 K/uL (ref 1.7–7.7)
Neutrophils Relative %: 47 %
Platelets: 243 K/uL (ref 150–400)
RBC: 5.25 MIL/uL (ref 4.22–5.81)
RDW: 14.6 % (ref 11.5–15.5)
WBC: 5.2 K/uL (ref 4.0–10.5)
nRBC: 0 % (ref 0.0–0.2)

## 2024-11-03 LAB — BASIC METABOLIC PANEL WITH GFR
Anion gap: 9 (ref 5–15)
BUN: 20 mg/dL (ref 8–23)
CO2: 22 mmol/L (ref 22–32)
Calcium: 8.8 mg/dL — ABNORMAL LOW (ref 8.9–10.3)
Chloride: 105 mmol/L (ref 98–111)
Creatinine, Ser: 1.63 mg/dL — ABNORMAL HIGH (ref 0.61–1.24)
GFR, Estimated: 43 mL/min — ABNORMAL LOW (ref >60)
Glucose, Bld: 277 mg/dL — ABNORMAL HIGH (ref 70–99)
Potassium: 4.2 mmol/L (ref 3.5–5.1)
Sodium: 136 mmol/L (ref 135–145)

## 2024-11-03 LAB — CBG MONITORING, ED
Glucose-Capillary: 285 mg/dL — ABNORMAL HIGH (ref 70–99)
Glucose-Capillary: 249 mg/dL — ABNORMAL HIGH (ref 70–99)

## 2024-11-03 LAB — URINALYSIS, ROUTINE W REFLEX MICROSCOPIC
Bacteria, UA: NONE SEEN
Bilirubin Urine: NEGATIVE
Glucose, UA: 50 mg/dL — AB
Hgb urine dipstick: NEGATIVE
Ketones, ur: NEGATIVE mg/dL
Leukocytes,Ua: NEGATIVE
Nitrite: NEGATIVE
Protein, ur: 100 mg/dL — AB
Specific Gravity, Urine: 1.021 (ref 1.005–1.030)
pH: 5 (ref 5.0–8.0)

## 2024-11-03 MED ORDER — NOVOLOG FLEXPEN 100 UNIT/ML ~~LOC~~ SOPN: 5.0000 [IU] | PEN_INJECTOR | Freq: Three times a day (TID) | SUBCUTANEOUS | 2 refills | Status: AC

## 2024-11-03 MED ORDER — LANTUS SOLOSTAR 100 UNIT/ML ~~LOC~~ SOPN
20.0000 [IU] | PEN_INJECTOR | Freq: Every day | SUBCUTANEOUS | 99 refills | Status: AC
Start: 2024-11-03 — End: ?

## 2024-11-03 MED ORDER — INSULIN PEN NEEDLE 32G X 4 MM MISC: 3 refills | Status: AC

## 2024-11-03 NOTE — ED Triage Notes (Signed)
 Patient states his omnipod (insulin  pump) stopped working around noon, patient took lantus  around 1700 but CBG is 373 at this time and he has no basal rate control d/t omnipod not working. Patient is A&Ox4, ambulatory to registration.

## 2024-11-03 NOTE — Addendum Note (Signed)
 Addended by: Marajade Lei on: 11/03/2024 03:17 PM   Modules accepted: Orders

## 2024-11-03 NOTE — ED Provider Triage Note (Signed)
 Emergency Medicine Provider Triage Evaluation Note  HARVIS MABUS , a 78 y.o. male  was evaluated in triage.  Pt complains of hyperglycemia.  History of a flutter.  Review of Systems  Positive: Polyuria Negative: Chest pain, shortness of breath, fever, chills  Physical Exam  BP (!) 184/104 (BP Location: Left Arm)   Pulse (!) 42   Temp 98.1 F (36.7 C)   Resp (!) 22   SpO2 99%  Gen:   Awake, no distress   Resp:  Normal effort  MSK:   Moves extremities without difficulty  Other:    Medical Decision Making  Medically screening exam initiated at 8:24 PM.  Appropriate orders placed.  JERRY CLYNE was informed that the remainder of the evaluation will be completed by another provider, this initial triage assessment does not replace that evaluation, and the importance of remaining in the ED until their evaluation is complete.  Labs ordered   Francis Ileana SAILOR, PA-C 11/03/24 2025

## 2024-11-03 NOTE — ED Triage Notes (Signed)
 States he was watching TV tonight and his sugar was high. His omnipod stopped working around noon today, so they called his PCP and they prescribed Lantus  pens. The wife states she gave 20 units at 5 pm tonight. CBG was 373 when they arrived.  Denies abdominal pain, chest pain, shortness of breath. Endorses increased thirst.

## 2024-11-03 NOTE — ED Notes (Signed)
 Pt left due to wait time, made sort staff aware before departure.

## 2024-11-03 NOTE — Telephone Encounter (Signed)
 Advisement given for Insulin  administration as directed by MD

## 2024-11-03 NOTE — Telephone Encounter (Signed)
 Spouse called office stating his omnipod receiver is no longer holding a charge. She called omnipod and they will be sending a new cord tomorrow however spouse wants to know what to do about his insulin  until the cord is received. Sent to MD for advisement.

## 2024-11-03 NOTE — Progress Notes (Signed)
 Please call and advise the patient that the recent labs were positive for presence of Alzheimer disease biomarker. Please continue with current medications. Please remind patient to keep any upcoming appointments or tests and to call us  with any interim questions, concerns, problems or updates. Thanks,   Pastor Falling, MD

## 2024-11-03 NOTE — Telephone Encounter (Signed)
 When not using insulin  pump he needs to use insulin  injections:  Take Lantus  20 units daily. Take NovoLog  5 units with meals 3 times a day.  Sent prescriptions.  Once Lantus  injection is given with around 24 hours to restart her insulin  pump.  Eternity Dexter, MD [facility] [address] [place], [address] [place], [place] [address] Phone # [phone]

## 2024-11-04 ENCOUNTER — Encounter: Attending: Adult Health | Admitting: Dietician

## 2024-11-04 ENCOUNTER — Telehealth: Payer: Self-pay

## 2024-11-04 ENCOUNTER — Encounter: Admitting: Dietician

## 2024-11-04 ENCOUNTER — Telehealth: Payer: Self-pay | Admitting: Dietician

## 2024-11-04 NOTE — Telephone Encounter (Signed)
 1st attempt, no answer and left message to return call

## 2024-11-04 NOTE — Telephone Encounter (Signed)
 Spouse called stating patient blood glucose elevated in 300s. Stated she gave him 6 units of Novolog  but patient generally takes 74. States that patient likes to eat oatmeal in the a.m. with a little sugar and currently he is at 328.

## 2024-11-04 NOTE — Telephone Encounter (Signed)
 If the blood sugar is below 200 after 2 hours of eating no additional insulin  is required.  However if it is still high okay to take NovoLog  based on a scale of 1 unit for every 50 glucose for blood sugar more than 200.  Christopher Arguijo, MD [facility] Endocrinology [facility] [address] [place], [address] [place], [place] [address] Phone # [phone]

## 2024-11-04 NOTE — Telephone Encounter (Signed)
-----   Message from [facility] sent at 11/03/2024  9:23 AM EST ----- Please call and advise the patient that the recent labs were positive for presence of Alzheimer disease biomarker. Please continue with current medications. Please remind patient to keep any upcoming  appointments or tests and to call us  with any interim questions, concerns, problems or updates. Thanks,   Pastor Falling, MD   ----- Message ----- From: Interface, [facility] Lab Results In Sent: 10/29/2024   7:37 AM EST To: Pastor Falling, MD

## 2024-11-04 NOTE — Progress Notes (Signed)
 Patient is here today with his wife. Patient's Omnipod 5 PDM had to be replaced and the new one needs programmed.  The following settings were entered in his new PDM (from MD note 08/03/2024):  Max Basal:  2.1  Basal 1.05 12 AM - 12 AM  ICR 1:1 as patient is not carbohydrate counting.  Reminded patient and wife the amounts to bolus for each meal.  ISF 1:35  Target 120  Correct above 130  Duration of Insulin  Action 4 hours  Max bolus 16 untis  Dexcom G7 transmitter number was also entered in the new PDM.  They were instructed to start a POD tomorrow morning 24 hours after his last lantus  dose.  Wife is to call for further questions.  Leita Constable, RD, LDN, CDCES, DipACLM

## 2024-11-04 NOTE — Addendum Note (Signed)
 Addended by: JOSHUA IZETTA CROME on: 11/04/2024 03:09 PM   Modules accepted: Orders

## 2024-11-04 NOTE — Telephone Encounter (Signed)
 Wife returned call and I reviewed results with her and patient. They verbalized understanding. She states that since starting the aricpet, patient has been unsteady on his feet  and has had some hallucinations. They are going to hold the medication nad see if symptoms resolve. Advised I would inform Dr. Gregg.

## 2024-11-04 NOTE — Telephone Encounter (Signed)
 Wife has called and needs a new Omnipod 5 PDM programed as he other one has stopped working.  Will make an appointment to see them today at 4:30 provided the PDM is shipped on time.  Leita Constable, RD, LDN, CDCES, DipACLM

## 2024-11-06 ENCOUNTER — Other Ambulatory Visit: Payer: Self-pay

## 2024-11-06 ENCOUNTER — Encounter (HOSPITAL_COMMUNITY): Payer: Self-pay | Admitting: Radiology

## 2024-11-06 ENCOUNTER — Emergency Department (HOSPITAL_COMMUNITY)
Admission: EM | Admit: 2024-11-06 | Discharge: 2024-11-06 | Discharge status: Against Medical Advice | Attending: Emergency Medicine | Admitting: Emergency Medicine

## 2024-11-06 DIAGNOSIS — F0390 Unspecified dementia without behavioral disturbance: Secondary | ICD-10-CM | POA: Diagnosis not present

## 2024-11-06 DIAGNOSIS — Z5321 Procedure and treatment not carried out due to patient leaving prior to being seen by health care provider: Secondary | ICD-10-CM | POA: Diagnosis not present

## 2024-11-06 HISTORY — DX: Unspecified dementia, unspecified severity, without behavioral disturbance, psychotic disturbance, mood disturbance, and anxiety: F03.90

## 2024-11-06 LAB — URINALYSIS, ROUTINE W REFLEX MICROSCOPIC
Bilirubin Urine: NEGATIVE
Glucose, UA: NEGATIVE mg/dL
Hgb urine dipstick: NEGATIVE
Ketones, ur: NEGATIVE mg/dL
Leukocytes,Ua: NEGATIVE
Nitrite: NEGATIVE
Protein, ur: 30 mg/dL — AB
Specific Gravity, Urine: 1.013 (ref 1.005–1.030)
pH: 5 (ref 5.0–8.0)

## 2024-11-06 LAB — COMPREHENSIVE METABOLIC PANEL WITH GFR
ALT: 132 U/L — ABNORMAL HIGH (ref 0–44)
AST: 118 U/L — ABNORMAL HIGH (ref 15–41)
Albumin: 3.4 g/dL — ABNORMAL LOW (ref 3.5–5.0)
Alkaline Phosphatase: 114 U/L (ref 38–126)
Anion gap: 11 (ref 5–15)
BUN: 23 mg/dL (ref 8–23)
CO2: 23 mmol/L (ref 22–32)
Calcium: 8.8 mg/dL — ABNORMAL LOW (ref 8.9–10.3)
Chloride: 107 mmol/L (ref 98–111)
Creatinine, Ser: 1.74 mg/dL — ABNORMAL HIGH (ref 0.61–1.24)
GFR, Estimated: 40 mL/min — ABNORMAL LOW (ref >60)
Glucose, Bld: 144 mg/dL — ABNORMAL HIGH (ref 70–99)
Potassium: 3.8 mmol/L (ref 3.5–5.1)
Sodium: 141 mmol/L (ref 135–145)
Total Bilirubin: 0.8 mg/dL (ref 0.0–1.2)
Total Protein: 5.9 g/dL — ABNORMAL LOW (ref 6.5–8.1)

## 2024-11-06 LAB — RAPID URINE DRUG SCREEN, HOSP PERFORMED
Amphetamines: NOT DETECTED
Barbiturates: NOT DETECTED
Benzodiazepines: NOT DETECTED
Cocaine: NOT DETECTED
Opiates: NOT DETECTED
Tetrahydrocannabinol: NOT DETECTED

## 2024-11-06 LAB — CBC
HCT: 39.1 % (ref 39.0–52.0)
Hemoglobin: 13.8 g/dL (ref 13.0–17.0)
MCH: 26.3 pg (ref 26.0–34.0)
MCHC: 35.3 g/dL (ref 30.0–36.0)
MCV: 74.6 fL — ABNORMAL LOW (ref 80.0–100.0)
Platelets: 229 K/uL (ref 150–400)
RBC: 5.24 MIL/uL (ref 4.22–5.81)
RDW: 14.9 % (ref 11.5–15.5)
WBC: 5 K/uL (ref 4.0–10.5)
nRBC: 0 % (ref 0.0–0.2)

## 2024-11-06 LAB — ETHANOL: Alcohol, Ethyl (B): <15 mg/dL (ref <15)

## 2024-11-06 NOTE — ED Provider Triage Note (Signed)
 Emergency Medicine Provider Triage Evaluation Note  Christopher [name] , a 78 y.o. male  was evaluated in triage.  Pt complains of increased confusion.  Patient's wife has concerns of increased dementia symptoms.  Patient's wife states that she had to get [employer] involved today and he was taken here for the increased confusion.  Patient was recently in the hospital for his dementia, and wife states that the confusion has been progressively worsening.   Review of Systems  Positive: Confusion, dementia Negative: Fever, nausea, vomiting, diarrhea, chest pain, shortness of breath  Physical Exam  BP (!) 156/107 (BP Location: Right Arm)   Pulse (!) 103   Temp 98.1 F (36.7 C)   Resp 20   Ht 6' 4 (1.93 m)   Wt 94.3 kg   SpO2 96%   BMI 25.32 kg/m  Gen:   Awake, no distress   Resp:  Normal effort  MSK:   Moves extremities without difficulty  Other:    Medical Decision Making  Medically screening exam initiated at 9:10 PM.  Appropriate orders placed.  HAILE BOSLER was informed that the remainder of the evaluation will be completed by another provider, this initial triage assessment does not replace that evaluation, and the importance of remaining in the ED until their evaluation is complete.     Willma Duwaine CROME, [place] 11/06/24 2114

## 2024-11-06 NOTE — ED Triage Notes (Signed)
 Patient recently in the hospital. Was diagnosed with dementia. The wife states that he has been getting progressively more confused lately. He was brought in by [facility] tonight after he walked out of his home and wouldn't go back with his wife. When [employer] asked him if they could take him home he stated he would rather die. He believes he is divorced from his wife.

## 2024-11-06 NOTE — ED Triage Notes (Signed)
 Pt states that he believes he is divorced from his wife for the past 3 years. He refused to go home with her so [employer] had to be called. He can't recall why he was brought here. Wife has no medical concerns other than his worsening dementia.

## 2024-11-06 NOTE — ED Notes (Signed)
 Pt wife agreed to take patient home. She understood that it would be against medical advise but she didn't want to stay longer and the patient was agreeable to go home. Pt wife will follow up with him doctor.

## 2024-11-08 ENCOUNTER — Telehealth: Payer: Self-pay | Admitting: Nutrition

## 2024-11-08 ENCOUNTER — Ambulatory Visit: Payer: Self-pay | Admitting: Student

## 2024-11-08 ENCOUNTER — Ambulatory Visit (HOSPITAL_COMMUNITY)
Admission: RE | Admit: 2024-11-08 | Discharge: 2024-11-08 | Disposition: A | Discharge status: Home / Self Care | Source: Ambulatory Visit | Attending: Cardiovascular Disease | Admitting: Cardiovascular Disease

## 2024-11-08 DIAGNOSIS — I4892 Unspecified atrial flutter: Secondary | ICD-10-CM | POA: Diagnosis not present

## 2024-11-08 DIAGNOSIS — E785 Hyperlipidemia, unspecified: Secondary | ICD-10-CM | POA: Diagnosis not present

## 2024-11-08 DIAGNOSIS — R7989 Other specified abnormal findings of blood chemistry: Secondary | ICD-10-CM | POA: Diagnosis not present

## 2024-11-08 DIAGNOSIS — I10 Essential (primary) hypertension: Secondary | ICD-10-CM | POA: Insufficient documentation

## 2024-11-08 DIAGNOSIS — I493 Ventricular premature depolarization: Secondary | ICD-10-CM | POA: Insufficient documentation

## 2024-11-08 LAB — ECHOCARDIOGRAM COMPLETE
Calc EF: 45.8 %
MV M vel: 5.24 m/s
MV Peak grad: 109.8 mmHg
Radius: 0.3 cm
S' Lateral: 3.4 cm
Single Plane A2C EF: 47.3 %
Single Plane A4C EF: 45.9 %

## 2024-11-08 NOTE — Telephone Encounter (Signed)
 Wife says everything is working fine oh his pump.  Reports that blood sugars are down andFBS wass 122 this morning

## 2024-11-10 ENCOUNTER — Inpatient Hospital Stay: Admitting: Adult Health

## 2024-11-10 DIAGNOSIS — I493 Ventricular premature depolarization: Secondary | ICD-10-CM | POA: Diagnosis not present

## 2024-11-10 DIAGNOSIS — R7989 Other specified abnormal findings of blood chemistry: Secondary | ICD-10-CM | POA: Diagnosis not present

## 2024-11-10 DIAGNOSIS — E785 Hyperlipidemia, unspecified: Secondary | ICD-10-CM | POA: Diagnosis not present

## 2024-11-10 DIAGNOSIS — I10 Essential (primary) hypertension: Secondary | ICD-10-CM | POA: Diagnosis not present

## 2024-11-10 NOTE — Progress Notes (Deleted)
   Subjective:    Patient ID: Christopher [name], male    DOB: [birth year], 78 y.o.   MRN: [id number]  HPI Patient presents for yearly preventative medicine examination. Her is a 78 year old male who  has a past medical history of Dementia (HCC), Diabetes mellitus type II, GERD (gastroesophageal reflux disease), Glaucoma, Hyperlipidemia, and Hypertension.  Type 1 DM - uncontrolled - managed by endocrinology.  He has been on insulin  therapy since 2013.  He has had a history of DKA x2  in 2023. He has recently been switched to an insulin  pump with pump settings at   BASAL rate: 1.15, I/C ratio 1: 1 with mealtime doses 12 units breakfast and 6 units before lunch and dinner  ISF: 40, timing 4 hours, target: 120 with correction over 130  His wife reports that his blood sugars have been 130 or below fasting and overall his blood sugars have been much better controlled. He is no longer having spikes.  Lab Results  Component Value Date   HGBA1C 6.7 (A) 08/03/2024   HGBA1C 6.7 (A) 02/09/2024   HGBA1C 6.6 (A) 11/07/2023   Mild Alzheimer's disease with late onset -he has been experiencing progressive memory loss and confusion over the last 2 to 3 years.  In October 2025 he noted to have had difficulty recognizing familiar people and places such as mistaking his wife for his deceased sister, not recognizing his wife but not knowing that they are married.  He is able to still perform some activities of daily living such as dressing and showering but requires assistance with managing his own pump and cooking.  He also has difficulty locating items such as food in the refrigerator, trouble with tasks like paying bills which often has resulted in weight please he was seen earlier this month by neurology and had a MMSE score of 18 with clinical symptoms suggesting Alzheimer's disease.  He was started on Aricept  5 mg daily at night.  He has some cognition issues and felt unsteady on his feet, Aricept  was  held  Hyperlipidemia due to type 1 DM -currently managed with atorvastatin  80 mg daily. He denies myalgia or fatigue Lab Results  Component Value Date   CHOL 214 (H) 03/20/2023   HDL 51.20 03/20/2023   LDLCALC 149 (H) 03/20/2023   LDLDIRECT 135.3 06/17/2013   TRIG 67.0 03/20/2023   CHOLHDL 4 03/20/2023   Glaucoma -  is seen by [facility] - reports pressures are well controlled. He takes Cosopt  and Alphagan    Acutely   All immunizations and health maintenance protocols were reviewed with the patient and needed orders were placed.  Appropriate screening laboratory values were ordered for the patient including screening of hyperlipidemia, renal function and hepatic function. If indicated by BPH, a PSA was ordered.  Medication reconciliation,  past medical history, social history, problem list and allergies were reviewed in detail with the patient  Goals were established with regard to weight loss, exercise, and  diet in compliance with medications  End of life planning was discussed.    Review of Systems     Objective:   Physical Exam        Assessment & Plan:

## 2024-11-11 LAB — TSH+FREE T4
Free T4: 1.4 ng/dL (ref 0.82–1.77)
TSH: 1.7 u[IU]/mL (ref 0.450–4.500)

## 2024-11-11 LAB — COMPREHENSIVE METABOLIC PANEL WITH GFR
ALT: 119 IU/L — ABNORMAL HIGH (ref 0–44)
AST: 65 IU/L — ABNORMAL HIGH (ref 0–40)
Albumin: 3.8 g/dL (ref 3.8–4.8)
Alkaline Phosphatase: 124 IU/L — ABNORMAL HIGH (ref 47–123)
BUN/Creatinine Ratio: 10 (ref 10–24)
BUN: 18 mg/dL (ref 8–27)
Bilirubin Total: 0.8 mg/dL (ref 0.0–1.2)
CO2: 23 mmol/L (ref 20–29)
Calcium: 9.1 mg/dL (ref 8.6–10.2)
Chloride: 108 mmol/L — ABNORMAL HIGH (ref 96–106)
Creatinine, Ser: 1.78 mg/dL — ABNORMAL HIGH (ref 0.76–1.27)
Globulin, Total: 2.1 g/dL (ref 1.5–4.5)
Glucose: 133 mg/dL — ABNORMAL HIGH (ref 70–99)
Potassium: 4.1 mmol/L (ref 3.5–5.2)
Sodium: 144 mmol/L (ref 134–144)
Total Protein: 5.9 g/dL — ABNORMAL LOW (ref 6.0–8.5)
eGFR: 39 mL/min/1.73 — ABNORMAL LOW (ref >59)

## 2024-11-11 LAB — MAGNESIUM: Magnesium: 1.9 mg/dL (ref 1.6–2.3)

## 2024-11-17 DIAGNOSIS — H34832 Tributary (branch) retinal vein occlusion, left eye, with macular edema: Secondary | ICD-10-CM | POA: Diagnosis not present

## 2024-11-17 DIAGNOSIS — H43813 Vitreous degeneration, bilateral: Secondary | ICD-10-CM | POA: Diagnosis not present

## 2024-11-17 DIAGNOSIS — H2513 Age-related nuclear cataract, bilateral: Secondary | ICD-10-CM | POA: Diagnosis not present

## 2024-11-17 DIAGNOSIS — H35033 Hypertensive retinopathy, bilateral: Secondary | ICD-10-CM | POA: Diagnosis not present

## 2024-11-17 DIAGNOSIS — E113313 Type 2 diabetes mellitus with moderate nonproliferative diabetic retinopathy with macular edema, bilateral: Secondary | ICD-10-CM | POA: Diagnosis not present

## 2024-11-17 DIAGNOSIS — H401132 Primary open-angle glaucoma, bilateral, moderate stage: Secondary | ICD-10-CM | POA: Diagnosis not present

## 2024-11-18 ENCOUNTER — Encounter: Payer: Self-pay | Admitting: Endocrinology

## 2024-11-18 ENCOUNTER — Ambulatory Visit: Admitting: Endocrinology

## 2024-11-18 ENCOUNTER — Ambulatory Visit: Payer: Self-pay | Admitting: Endocrinology

## 2024-11-18 ENCOUNTER — Ambulatory Visit: Admitting: "Endocrinology

## 2024-11-18 VITALS — BP 124/82 | HR 50 | Resp 16 | Ht 76.0 in | Wt 211.2 lb

## 2024-11-18 DIAGNOSIS — E108 Type 1 diabetes mellitus with unspecified complications: Secondary | ICD-10-CM | POA: Diagnosis not present

## 2024-11-18 DIAGNOSIS — E1065 Type 1 diabetes mellitus with hyperglycemia: Secondary | ICD-10-CM | POA: Diagnosis not present

## 2024-11-18 LAB — POCT GLYCOSYLATED HEMOGLOBIN (HGB A1C): Hemoglobin A1C: 6.6 % — AB (ref 4.0–5.6)

## 2024-11-18 NOTE — Progress Notes (Signed)
 Outpatient Endocrinology Note Christopher Roger, MD  11/18/24  Patient's Name: Christopher [name]    DOB: 08-01-1946    MRN: [id number]                                                    REASON OF VISIT: Follow up of for type 1 diabetes mellitus  PCP: Merna Huxley, NP  HISTORY OF PRESENT ILLNESS:   Christopher [name] is a 78 y.o. old male with past medical history listed below, is here for follow up of type 1 diabetes mellitus.   Pertinent Diabetes History: Patient was diagnosed with type 1 diabetes mellitus in 2010, insulin  therapy was started in 2013.  He was previously on Trulicity  and Victoza  as well which were stopped later.  He had tried Levemir  and NPH insulin  in the past.  History of DKA or diabetes related hospitalizations: yes, at least 2 times.  Previous diabetes education: Yes   Chronic Diabetes Complications : Retinopathy: no. Last ophthalmology exam was done on annually. Nephropathy: no, on lisinopril . Peripheral neuropathy: no Coronary artery disease: no Stroke: no  Relevant comorbidities and cardiovascular risk factors: Obesity: no Body mass index is 25.71 kg/m.  Hypertension: yes Hyperlipidemia. yes  Current / Home Diabetic regimen includes:  OmniPod 5 with Dexcom G6, uses NovoLog  U100.  Insulin  Pump setting:  Basal ( 25.2 units/day) MN- 1.05u/hour Bolus  CHO Ratio (1unit:CHO) MN- 1:1  He uses fixed carb count  for 12 breakfast, 2 - 5 for lunch and 2 -5 for supper.  Correction/Sensitivity: MN- 1:35  Target: 130 and 120  Active insulin  time: 4 hours  Prior diabetic medications: Trulicity , Victoza , Levemir , NPH.  CONTINUOUS GLUCOSE MONITORING SYSTEM (CGMS) / INSULIN  PUMP INTERPRETATION:                         OmniPod 5 Pump & Dexcom G6 Sensor Download (Reviewed and summarized below.)  Dates: November 20 to December 3 , 2025, 14 days Glucose Management Indicator: %  Average total daily insulin :33 units, Basal: 64%, Bolus: 36%        Trends:   Mostly acceptable blood sugar overnight and in the afternoon.  Occasional hyperglycemia with blood sugar up to 250 range related to supper postprandially due to not enough meal boluses has been using 1.5 or 2 as carb count.  There has been frequent suspension of basal rate, no concerning hypoglycemia.  Noted transient blood sugar in 50s overnight seems to be sensor related due to compression.  Hypoglycemia: Patient has no hypoglycemic episodes. Patient has hypoglycemia awareness.    Factors modifying glucose control: 1.  Diabetic diet assessment: 3 meals a day breakfast (oatmeal usually), lunch and dinner however sometimes he eats cake or ice cream.   2.  Staying active or exercising: Playing golf and active at home.  3.  Medication compliance: compliant all of the time.  Interval history  Pump and CGM data as reviewed above.  Hemoglobin A1c 6.6% today.  Mostly acceptable blood sugar.  Patient is accompanied by wife in the clinic today.  Reports appetite has been down and does not like some of the food lately and has not been eating much mostly eating supper in the late afternoon or in the evening. No numbness and ting of the feet.  No vision problem.  No other complaints today.  REVIEW OF SYSTEMS As per history of present illness.   PAST MEDICAL HISTORY: Past Medical History:  Diagnosis Date   Dementia (HCC)    Diabetes mellitus type II    GERD (gastroesophageal reflux disease)    Glaucoma    Hyperlipidemia    Hypertension     PAST SURGICAL HISTORY: Past Surgical History:  Procedure Laterality Date   COLONOSCOPY     HERNIA REPAIR     PATELLA FRACTURE SURGERY  1993    ALLERGIES: No Known Allergies  FAMILY HISTORY:  Family History  Problem Relation Age of Onset   Alcohol abuse Father    Diabetes Sister    Diabetes Brother    Diabetes Brother    Colon cancer Neg Hx    Esophageal cancer Neg Hx    Stomach cancer Neg Hx    Rectal cancer Neg Hx     SOCIAL  HISTORY: Social History   Socioeconomic History   Marital status: Married    Spouse name: Not on file   Number of children: Not on file   Years of education: Not on file   Highest education level: Not on file  Occupational History   Not on file  Tobacco Use   Smoking status: Never   Smokeless tobacco: Never  Vaping Use   Vaping status: Never Used  Substance and Sexual Activity   Alcohol use: No   Drug use: No   Sexual activity: Not Currently  Other Topics Concern   Not on file  Social History Narrative   He enjoys playing golf    Social Drivers of Health   Financial Resource Strain: Low Risk  (09/01/2024)   Overall Financial Resource Strain (CARDIA)    Difficulty of Paying Living Expenses: Not hard at all  Food Insecurity: No Food Insecurity (09/01/2024)   Hunger Vital Sign    Worried About Running Out of Food in the Last Year: Never true    Ran Out of Food in the Last Year: Never true  Transportation Needs: No Transportation Needs (09/01/2024)   PRAPARE - Administrator, Civil Service (Medical): No    Lack of Transportation (Non-Medical): No  Physical Activity: Inactive (09/01/2024)   Exercise Vital Sign    Days of Exercise per Week: 0 days    Minutes of Exercise per Session: 0 min  Stress: No Stress Concern Present (09/01/2024)   [name]    Feeling of Stress: Not at all  Social Connections: Socially Integrated (09/01/2024)   Social Connection and Isolation Panel    Frequency of Communication with Friends and Family: More than three times a week    Frequency of Social Gatherings with Friends and Family: More than three times a week    Attends Religious Services: More than 4 times per year    Active Member of [employer] or Organizations: Yes    Attends Engineer, Structural: More than 4 times per year    Marital Status: Married    MEDICATIONS:  Current Outpatient Medications   Medication Sig Dispense Refill   apixaban  (ELIQUIS ) 5 MG TABS tablet Take 1 tablet (5 mg total) by mouth 2 (two) times daily. 60 tablet 1   atorvastatin  (LIPITOR ) 80 MG tablet Take 1 tablet (80 mg total) by mouth at bedtime. 30 tablet 0   carvedilol  (COREG ) 6.25 MG tablet Take 1 tablet (6.25 mg total) by mouth 2 (two) times daily. 180 tablet  3   Continuous Glucose Sensor (DEXCOM G6 SENSOR) MISC Change every 10 days 9 each 3   Continuous Glucose Transmitter (DEXCOM G6 TRANSMITTER) MISC Use every 3 months 1 each 3   insulin  aspart (NOVOLOG  FLEXPEN) 100 UNIT/ML FlexPen Inject 5 Units into the skin 3 (three) times daily with meals. To use as a backup when insulin  pump is not working. 15 mL 2   insulin  aspart (NOVOLOG ) 100 UNIT/ML injection INJECT UP TO 50 UNITS SUBCUTANEOUSLY ONCE DAILY IN INSULIN  PUMP, 20 mL 0   Insulin  Disposable Pump (OMNIPOD 5 DEXG7G6 PODS GEN 5) MISC CHANGE POD EVERY 3 DAYS 30 each 3   Insulin  Disposable Pump (OMNIPOD 5 G6 INTRO, GEN 5,) KIT Inject into the skin.     insulin  glargine (LANTUS  SOLOSTAR) 100 UNIT/ML Solostar Pen Inject 20 Units into the skin daily. To use when insulin  pump is not working, as a backup. 15 mL PRN   Insulin  Pen Needle 32G X 4 MM MISC Use 4x a day with insulin  pens to administer insulin  100 each 3   lisinopril  (ZESTRIL ) 2.5 MG tablet Take 1 tablet by mouth once daily 90 tablet 0   Multiple Vitamins-Minerals (MULTIVITAMIN ADULTS 50+) TABS Take 1 tablet by mouth daily.     NOVOLOG  100 UNIT/ML injection INJECT UP TO 60 UNITS SUBCUTANEOUSLY ONCE DAILY IN  INSULIN   PUMP 50 mL 3   No current facility-administered medications for this visit.    PHYSICAL EXAM: Vitals:   11/18/24 0838  BP: 124/82  Pulse: (!) 50  Resp: 16  SpO2: 97%  Weight: 211 lb 3.2 oz (95.8 kg)  Height: 6' 4 (1.93 m)     Body mass index is 25.71 kg/m.  Wt Readings from Last 3 Encounters:  11/18/24 211 lb 3.2 oz (95.8 kg)  11/06/24 208 lb (94.3 kg)  11/01/24 208 lb 3.2 oz  (94.4 kg)    General: Well developed, well nourished male in no apparent distress.  HEENT: AT/[place], no external lesions.  Eyes: Conjunctiva clear and no icterus. Neck: Neck supple  Lungs: Respirations not labored Neurologic: Alert, oriented, normal speech Extremities / Skin: Dry.  Psychiatric: Does not appear depressed or anxious  Diabetic Foot Exam - Simple   No data filed     LABS Reviewed Lab Results  Component Value Date   HGBA1C 6.6 (A) 11/18/2024   HGBA1C 6.7 (A) 08/03/2024   HGBA1C 6.7 (A) 02/09/2024   Lab Results  Component Value Date   FRUCTOSAMINE 427 (H) 10/04/2022   FRUCTOSAMINE 479 (H) 09/16/2016   Lab Results  Component Value Date   CHOL 214 (H) 03/20/2023   HDL 51.20 03/20/2023   LDLCALC 149 (H) 03/20/2023   LDLDIRECT 135.3 06/17/2013   TRIG 67.0 03/20/2023   CHOLHDL 4 03/20/2023   Lab Results  Component Value Date   MICRALBCREAT 3 08/03/2024   MICRALBCREAT 0.1 03/19/2012   Lab Results  Component Value Date   CREATININE 1.78 (H) 11/10/2024   Lab Results  Component Value Date   GFR 42.42 (L) 06/08/2024    ASSESSMENT / PLAN  1. Type 1 diabetes mellitus with complications (HCC)   2. Uncontrolled type 1 diabetes mellitus with hyperglycemia (HCC)     Diabetes Mellitus type 1, complicated by no other known complications. - Diabetic status / severity: fair Controlled.  Lab Results  Component Value Date   HGBA1C 6.6 (A) 11/18/2024    - Hemoglobin A1c goal <6.5%   - Medications:  Insulin  pump setting as follows: Changed  as follows. OmniPod 5 insulin  Pump setting: Using NovoLog  U100.  With Dexcom G6.  Insulin  Pump setting:  Basal MN- 1.05u/hour, changed to 0.95 Bolus  CHO Ratio (1unit:CHO) MN- 1:1  He uses fixed carb count 12 for breakfast, 4-6 for lunch and 4-6 for supper.  He is mainly using 4 for lunch and supper advised to use up to 6 carb for lunch and supper.  Discussed in detail to adjust based on meal size and type.  Advised  to take 1-2 carb for sugary snacks like ice cream/cracker.  Advised to increase the carb count for lunch and supper.  Lately using less carb 1.5-2 range, advised to use 4-6 range for regular meals.   Correction/Sensitivity: MN- 1:35  Target: 130 and 120  Active insulin  time: 4 hours  - Home glucose testing: continue CGM and check blood glucose as needed.  He prefers to stay on Dexcom G6.  Discussed about trying Dexcom G7. - Discussed/ Gave Hypoglycemia treatment plan.  # Consult : Refer to dietitian.  Patient requested to see dietitian due to recent change in appetite, not eating much and would like to loan food choices.  # Annual urine for microalbuminuria/ creatinine ratio, no microalbuminuria currently, continue ACE/ARB /lisinopril .  Last  Lab Results  Component Value Date   MICRALBCREAT 3 08/03/2024    # Foot check nightly.  # Annual dilated diabetic eye exams.   - Diet: Eat reasonable portion sizes to promote a healthy weight.  Lately low appetite.  Referred to dietitian. - Life style / activity / exercise: Discussed.  2. Blood pressure  -  BP Readings from Last 1 Encounters:  11/18/24 124/82    - Control is in target.  - No change in current plans.  3. Lipid status / Hyperlipidemia - Last  Lab Results  Component Value Date   LDLCALC 149 (H) 03/20/2023   - Continue atorvastatin  80 mg daily.  Managed by primary care provider.  Diagnoses and all orders for this visit:  Type 1 diabetes mellitus with complications (HCC) -     POCT glycosylated hemoglobin (Hb A1C) -     Amb Referral to Nutrition and Diabetic Education  Uncontrolled type 1 diabetes mellitus with hyperglycemia (HCC)   DISPOSITION Follow up in clinic in 4 months suggested.   All questions answered and patient verbalized understanding of the plan.  Jerred Zaremba, MD [facility] Endocrinology [facility] [address] [place], [address] [place], [place] [address] Phone # [phone]  At  least part of this note was generated using voice recognition software. Inadvertent word errors may have occurred, which were not recognized during the proofreading process.

## 2024-11-19 ENCOUNTER — Ambulatory Visit: Payer: Self-pay | Admitting: Adult Health

## 2024-11-19 ENCOUNTER — Other Ambulatory Visit: Payer: Self-pay | Admitting: Adult Health

## 2024-11-19 ENCOUNTER — Ambulatory Visit: Admitting: Adult Health

## 2024-11-19 ENCOUNTER — Encounter: Payer: Self-pay | Admitting: Endocrinology

## 2024-11-19 ENCOUNTER — Encounter: Payer: Self-pay | Admitting: Adult Health

## 2024-11-19 VITALS — BP 120/80 | HR 61 | Temp 97.9°F | Ht 76.0 in | Wt 211.0 lb

## 2024-11-19 DIAGNOSIS — R748 Abnormal levels of other serum enzymes: Secondary | ICD-10-CM

## 2024-11-19 DIAGNOSIS — F02A3 Dementia in other diseases classified elsewhere, mild, with mood disturbance: Secondary | ICD-10-CM

## 2024-11-19 DIAGNOSIS — I10 Essential (primary) hypertension: Secondary | ICD-10-CM | POA: Diagnosis not present

## 2024-11-19 DIAGNOSIS — G301 Alzheimer's disease with late onset: Secondary | ICD-10-CM | POA: Diagnosis not present

## 2024-11-19 LAB — COMPREHENSIVE METABOLIC PANEL WITH GFR
ALT: 95 U/L — ABNORMAL HIGH (ref 0–53)
AST: 61 U/L — ABNORMAL HIGH (ref 0–37)
Albumin: 3.7 g/dL (ref 3.5–5.2)
Alkaline Phosphatase: 89 U/L (ref 39–117)
BUN: 21 mg/dL (ref 6–23)
CO2: 28 meq/L (ref 19–32)
Calcium: 8.9 mg/dL (ref 8.4–10.5)
Chloride: 107 meq/L (ref 96–112)
Creatinine, Ser: 1.63 mg/dL — ABNORMAL HIGH (ref 0.40–1.50)
GFR: 40.42 mL/min — ABNORMAL LOW (ref >60.00)
Glucose, Bld: 112 mg/dL — ABNORMAL HIGH (ref 70–99)
Potassium: 4.2 meq/L (ref 3.5–5.1)
Sodium: 143 meq/L (ref 135–145)
Total Bilirubin: 1.3 mg/dL — ABNORMAL HIGH (ref 0.2–1.2)
Total Protein: 5.6 g/dL — ABNORMAL LOW (ref 6.0–8.3)

## 2024-11-19 NOTE — Progress Notes (Signed)
 Subjective:    Patient ID: Christopher [name], male    DOB: 03-20-1946, 78 y.o.   MRN: [id number]  HPI  78 year old male who  has a past medical history of Dementia (HCC), Diabetes mellitus type II, GERD (gastroesophageal reflux disease), Glaucoma, Hyperlipidemia, and Hypertension.  He presents to the office with his wife for follow-up.   He has had to go to the emergency room twice but both times he left prior to being seen. The first episode was on 11/03/2024 at which time his insulin  pump stopped working and his blood sugar was elevated in the upper 300's.   The second time was on 11/06/2024 due to agitation from dementia.  He believes that he was divorced from his wife and refused to go home with her so [facility] police were called in and he was taken to the emergency department.  His wife ended up taking him home before he could be formally evaluated.  He did have blood work done which showed elevated liver enzymes and cardiology advised him to stop his statin which his wife has done.  Repeat labs done on 07/26/2025 showed some minor improvement in his liver enzymes. AST 118 ---->65 and ALT 132 --->119.   He denies abdominal pain, nausea, vomiting, diarrhea or yellowing of skin.   His wife also reports that he was started on Aricept  5 mg by neurology on October 28, 2024 but this was stopped after he had increased confusion and weird dreams with dizziness.  His wife reports today that he has returned to his baseline in regards to dementia ( will occasionally call his wife by his sisters name) but has not been agitated since taken to the emergency room roughly two weeks ago.  They do plan on following up with Neurology.   It was noted that his blood pressure was elevated during the ER visit on 11/06/2024.  Blood pressure is managed with Coreg  6.25 mg twice daily and lisinopril  2.5 mg daily.  His wife is making sure that he is taking his medications as directed       Review of  Systems See HPI   Past Medical History:  Diagnosis Date   Dementia (HCC)    Diabetes mellitus type II    GERD (gastroesophageal reflux disease)    Glaucoma    Hyperlipidemia    Hypertension     Social History   Socioeconomic History   Marital status: Married    Spouse name: Not on file   Number of children: Not on file   Years of education: Not on file   Highest education level: Not on file  Occupational History   Not on file  Tobacco Use   Smoking status: Never   Smokeless tobacco: Never  Vaping Use   Vaping status: Never Used  Substance and Sexual Activity   Alcohol use: No   Drug use: No   Sexual activity: Not Currently  Other Topics Concern   Not on file  Social History Narrative   He enjoys playing golf    Social Drivers of Health   Financial Resource Strain: Low Risk  (09/01/2024)   Overall Financial Resource Strain (CARDIA)    Difficulty of Paying Living Expenses: Not hard at all  Food Insecurity: No Food Insecurity (09/01/2024)   Hunger Vital Sign    Worried About Running Out of Food in the Last Year: Never true    Ran Out of Food in the Last Year: Never true  Transportation Needs:  No Transportation Needs (09/01/2024)   PRAPARE - Administrator, Civil Service (Medical): No    Lack of Transportation (Non-Medical): No  Physical Activity: Inactive (09/01/2024)   Exercise Vital Sign    Days of Exercise per Week: 0 days    Minutes of Exercise per Session: 0 min  Stress: No Stress Concern Present (09/01/2024)   [name]    Feeling of Stress: Not at all  Social Connections: Socially Integrated (09/01/2024)   Social Connection and Isolation Panel    Frequency of Communication with Friends and Family: More than three times a week    Frequency of Social Gatherings with Friends and Family: More than three times a week    Attends Religious Services: More than 4 times per year    Active  Member of [employer] or Organizations: Yes    Attends Engineer, Structural: More than 4 times per year    Marital Status: Married  Catering Manager Violence: Not At Risk (09/01/2024)   Humiliation, Afraid, Rape, and Kick questionnaire    Fear of Current or Ex-Partner: No    Emotionally Abused: No    Physically Abused: No    Sexually Abused: No    Past Surgical History:  Procedure Laterality Date   COLONOSCOPY     HERNIA REPAIR     PATELLA FRACTURE SURGERY  1993    Family History  Problem Relation Age of Onset   Alcohol abuse Father    Diabetes Sister    Diabetes Brother    Diabetes Brother    Colon cancer Neg Hx    Esophageal cancer Neg Hx    Stomach cancer Neg Hx    Rectal cancer Neg Hx     No Known Allergies  Current Outpatient Medications on File Prior to Visit  Medication Sig Dispense Refill   apixaban  (ELIQUIS ) 5 MG TABS tablet Take 1 tablet (5 mg total) by mouth 2 (two) times daily. 60 tablet 1   atorvastatin  (LIPITOR ) 80 MG tablet Take 1 tablet (80 mg total) by mouth at bedtime. 30 tablet 0   carvedilol  (COREG ) 6.25 MG tablet Take 1 tablet (6.25 mg total) by mouth 2 (two) times daily. 180 tablet 3   Continuous Glucose Sensor (DEXCOM G6 SENSOR) MISC Change every 10 days 9 each 3   Continuous Glucose Transmitter (DEXCOM G6 TRANSMITTER) MISC Use every 3 months 1 each 3   insulin  aspart (NOVOLOG  FLEXPEN) 100 UNIT/ML FlexPen Inject 5 Units into the skin 3 (three) times daily with meals. To use as a backup when insulin  pump is not working. 15 mL 2   insulin  aspart (NOVOLOG ) 100 UNIT/ML injection INJECT UP TO 50 UNITS SUBCUTANEOUSLY ONCE DAILY IN INSULIN  PUMP, 20 mL 0   Insulin  Disposable Pump (OMNIPOD 5 DEXG7G6 PODS GEN 5) MISC CHANGE POD EVERY 3 DAYS 30 each 3   Insulin  Disposable Pump (OMNIPOD 5 G6 INTRO, GEN 5,) KIT Inject into the skin.     insulin  glargine (LANTUS  SOLOSTAR) 100 UNIT/ML Solostar Pen Inject 20 Units into the skin daily. To use when insulin  pump is  not working, as a backup. 15 mL PRN   Insulin  Pen Needle 32G X 4 MM MISC Use 4x a day with insulin  pens to administer insulin  100 each 3   latanoprost (XALATAN) 0.005 % ophthalmic solution 1 drop at bedtime.     lisinopril  (ZESTRIL ) 2.5 MG tablet Take 1 tablet by mouth once daily 90 tablet 0  Multiple Vitamins-Minerals (MULTIVITAMIN ADULTS 50+) TABS Take 1 tablet by mouth daily.     NOVOLOG  100 UNIT/ML injection INJECT UP TO 60 UNITS SUBCUTANEOUSLY ONCE DAILY IN  INSULIN   PUMP 50 mL 3   No current facility-administered medications on file prior to visit.    BP 120/80   Pulse 61   Temp 97.9 F (36.6 C) (Oral)   Ht 6' 4 (1.93 m)   Wt 211 lb (95.7 kg)   SpO2 97%   BMI 25.68 kg/m       Objective:   Physical Exam Vitals and nursing note reviewed.  Constitutional:      Appearance: Normal appearance.  Cardiovascular:     Rate and Rhythm: Normal rate and regular rhythm.     Pulses: Normal pulses.     Heart sounds: Normal heart sounds.  Pulmonary:     Effort: Pulmonary effort is normal.     Breath sounds: Normal breath sounds.  Abdominal:     General: Abdomen is flat. Bowel sounds are normal. There is no distension.     Palpations: Abdomen is soft. There is no mass.     Tenderness: There is no abdominal tenderness.     Hernia: No hernia is present.  Musculoskeletal:     Right lower leg: No edema.     Left lower leg: No edema.  Neurological:     Mental Status: He is alert.  Psychiatric:        Attention and Perception: Attention normal.        Mood and Affect: Mood normal.        Speech: Speech normal.        Behavior: Behavior is slowed. Behavior is cooperative.        Cognition and Memory: Cognition is impaired. Memory is impaired.        Judgment: Judgment normal.        Assessment & Plan:  1. 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 Some mild confusion noted on exam today  - Follow up with  Neurology as directed  2. Elevated liver enzymes - Likely in the setting of statin therapy that he has stopped. Will recheck liver enzymes today. If still elevated will order RUQ US  - Comprehensive metabolic panel with GFR; Future - Comprehensive metabolic panel with GFR  3. Primary hypertension - At goal. No change in therapy   Teresia Myint, NP   I personally spent a total of 40 minutes in the care of the patient today including preparing to see the patient, getting/reviewing separately obtained history, performing a medically appropriate exam/evaluation, counseling and educating, placing orders, documenting clinical information in the EHR, and communicating results.

## 2024-11-24 DIAGNOSIS — I4892 Unspecified atrial flutter: Secondary | ICD-10-CM | POA: Diagnosis not present

## 2024-11-24 DIAGNOSIS — I493 Ventricular premature depolarization: Secondary | ICD-10-CM | POA: Diagnosis not present

## 2024-11-25 ENCOUNTER — Telehealth: Payer: Self-pay | Admitting: Cardiology

## 2024-11-25 DIAGNOSIS — R7989 Other specified abnormal findings of blood chemistry: Secondary | ICD-10-CM | POA: Diagnosis not present

## 2024-11-25 DIAGNOSIS — I493 Ventricular premature depolarization: Secondary | ICD-10-CM

## 2024-11-25 DIAGNOSIS — I4891 Unspecified atrial fibrillation: Secondary | ICD-10-CM

## 2024-11-25 DIAGNOSIS — E785 Hyperlipidemia, unspecified: Secondary | ICD-10-CM

## 2024-11-25 DIAGNOSIS — I4892 Unspecified atrial flutter: Secondary | ICD-10-CM | POA: Diagnosis not present

## 2024-11-25 DIAGNOSIS — I10 Essential (primary) hypertension: Secondary | ICD-10-CM | POA: Diagnosis not present

## 2024-11-25 NOTE — Telephone Encounter (Signed)
 I received the call from [facility] for Zio heart monitor results. The report is also in the patient's chart. The report showed 100% Afib burden and had 3 ventricular tachycardia runs.  I called the patient's wife. She states that the patient has some dementia and it is better to speak with her. I let her know of the heart monitor results and she stated that the patient was asymptomatic. The patient has an appointment with an APP on 12/15/24. I will forward this to his provider and the wife said she will reach out if needed.

## 2024-11-25 NOTE — Telephone Encounter (Signed)
 Triage: The monitor was performed to evaluate for burden. Patient is already on anticoagulation.  Christopher [name]:  FYI-his burden is 100%. Couple nonsustained episodes of NSVT, auto triggered. He had a nuclear stress test which was low risk back in 2024. Consider getting him scheduled for cardioversion+/- A-fib clinic to help coordinate care. Whether he follows up with you or Damien (tentatively scheduled for 12/15/2024) consider either coronary CTA or stress test given his PVC burden (ideal once in SR or sooner if needed clinically).   Dr. Catalino Plascencia

## 2024-11-25 NOTE — Telephone Encounter (Signed)
 Caller Susette) called to report abnormal Zio monitor results.

## 2024-11-26 MED ORDER — MAGNESIUM OXIDE 400 MG PO TABS: 400.0000 mg | ORAL_TABLET | Freq: Every day | ORAL | Status: DC

## 2024-11-26 NOTE — Telephone Encounter (Signed)
 Spoke with patient's wife [name] (OK per DPR).  Shared recommendations from Dr. Tolia and Dayna Dunn, PA-C.  Christopher [name] verbalized understanding and agrees with plan.  Magnesium oxide 400 mg daily added to med list. Referral for Afib Clinic and EP ordered, scheduler to call and schedule appts. Christopher [name] aware to keep appt with Damien on 12/31.

## 2024-11-26 NOTE — Telephone Encounter (Signed)
 Thank you Dr. Michele! Patient has had recent ER visits for increased confusion, trying to get [employer] involved, in the setting of his dementia, will need to take this into context. Agree that afib clinic would be helpful!  Triage: 1) Can we please refer the patient to afib clinic but his persistent atrial flutter burden and see if they can get him in to discuss next steps with him and his wife? She helps care for him. 2) Start MagOx 400mg  daily for recent magnesium level 1.9 3) Place referral for EP due to high PVC burden/NSVT. Will continue present regimen pending afib/EP eval. 4) Keep f/u with Damien as scheduled 12/15/24 (I did not have any clinic days in timeframe needed for general cardiology f/u), would suggest f/u magnesium at that visit - put in appt comments for consideration  Thank you!

## 2024-11-26 NOTE — Telephone Encounter (Signed)
 Damien,   Just looping you in as you are going to see him.   Thanks everyone.   Dr. Eriko Economos

## 2024-11-29 ENCOUNTER — Telehealth: Payer: Self-pay

## 2024-11-29 ENCOUNTER — Telehealth: Payer: Self-pay | Admitting: Neurology

## 2024-11-29 NOTE — Telephone Encounter (Signed)
 Pt wife called to request to speak to MD about Pt next steps. Pt wife wants  to know since Pt has been off Aricept  . And Pt is still experiencing  the  same symptoms he had before ,. Pt wife will like to discuss  maybe next step or different treatment .

## 2024-11-29 NOTE — Telephone Encounter (Signed)
 Insulin  pump and Dexcom data downloaded and reviewed as follows -December 15 today blood sugar overnight and until around 10 AM acceptable, no hypo and hyperglycemia. - December 14 Sunday mostly acceptable blood sugar with blood sugar up to 200s around 11 AM and around 6 to 10 PM likely related to meals postprandially, no significant hyperglycemia.  On Saturday, November 27, 2024 : Patient had significant hyperglycemia with blood sugar 300-350 range in the evening starting around 5 PM seems to be related to meals, he had given meal bolus with 5 and 2, noted that blood sugar was trending down and came down to the acceptable range by 2 AM.  Plan: - Other than hyperglycemia on Saturday night most of the blood sugars are in the acceptable range. -If patient has changed the pump in last 2 days, probably hyperglycemia on Saturday evening was related to pump issue however today and yesterday, blood sugar were in the acceptable range. -If patient has concern about heavy breathing, or if still with significantly high blood sugar, something else probably happening, I would recommend to go to ER.  Naika Noto, MD [facility] Endocrinology [facility] [address], [address] [place], [place] [address] Phone # [phone]

## 2024-11-29 NOTE — Telephone Encounter (Signed)
 Spouse called  stating  that patient's blood glucose has been extremely elevated stating it does not give a number just stating high. She states for last 2 days after he has had labored breathing. Patient calling for advisement form MD.  Spouse called back. Phone went to VM.

## 2024-11-30 ENCOUNTER — Telehealth: Payer: Self-pay | Admitting: Cardiology

## 2024-11-30 NOTE — Telephone Encounter (Signed)
 Left message to call back.

## 2024-11-30 NOTE — Telephone Encounter (Signed)
 SOB started Friday afternoon.  Today might be somewhat improving, but yesterday pt did not feel good.  Denies CP, edema/wt gain.  Pt does have dizziness when standing after prolonged sitting.  Pt reports that he feels alright.  Wife states SOB is slightly improving. Pt unsure if in afib/afl, but denies racing heart/skipped beats. Confirms compliant with Eliquis . Advised that since pt is improving, if only slightly, and feeling ok it should be ok to wait until appt next week with the afib clinic.   Advised to monitor BP.  Pt does not have a way to monitor HR, but wife has an apple watch and informed that she can check pt's HR using her watch if needed. Aware forwarding to Dayna Dunn, who last saw pt, if any other/different advisement than monitoring  and keep appt next week. Wife agreeable to plan and aware office will only call back if provider has other instruction.

## 2024-11-30 NOTE — Telephone Encounter (Signed)
 Pt c/o Shortness Of Breath: STAT if SOB developed within the last 24 hours or pt is noticeably SOB on the phone  1. Are you currently SOB (can you hear that pt is SOB on the phone)? Yes   2. How long have you been experiencing SOB? Since Friday   3. Are you SOB when sitting or when up moving around? Both   4. Are you currently experiencing any other symptoms? A little off balance   Pts wife calling due to pt being SOB since Friday evening. She states it has gotten a little better, but still ongoing. She also states the patient has been off balance. Please advise.

## 2024-12-01 ENCOUNTER — Telehealth: Payer: Self-pay | Admitting: Internal Medicine

## 2024-12-01 ENCOUNTER — Encounter (HOSPITAL_COMMUNITY): Payer: Self-pay | Admitting: *Deleted

## 2024-12-01 ENCOUNTER — Other Ambulatory Visit: Payer: Self-pay

## 2024-12-01 ENCOUNTER — Observation Stay (HOSPITAL_COMMUNITY)
Admission: EM | Admit: 2024-12-01 | Discharge: 2024-12-02 | Disposition: A | Discharge status: Home / Self Care | Source: Home / Self Care | Attending: Family Medicine | Admitting: Family Medicine

## 2024-12-01 ENCOUNTER — Inpatient Hospital Stay: Admission: RE | Admit: 2024-12-01 | Discharge: 2024-12-01 | Attending: Adult Health

## 2024-12-01 ENCOUNTER — Emergency Department (HOSPITAL_COMMUNITY)

## 2024-12-01 DIAGNOSIS — I5022 Chronic systolic (congestive) heart failure: Principal | ICD-10-CM | POA: Insufficient documentation

## 2024-12-01 DIAGNOSIS — K219 Gastro-esophageal reflux disease without esophagitis: Secondary | ICD-10-CM | POA: Insufficient documentation

## 2024-12-01 DIAGNOSIS — H409 Unspecified glaucoma: Secondary | ICD-10-CM | POA: Insufficient documentation

## 2024-12-01 DIAGNOSIS — R748 Abnormal levels of other serum enzymes: Secondary | ICD-10-CM

## 2024-12-01 DIAGNOSIS — N1832 Chronic kidney disease, stage 3b: Secondary | ICD-10-CM | POA: Insufficient documentation

## 2024-12-01 DIAGNOSIS — E108 Type 1 diabetes mellitus with unspecified complications: Secondary | ICD-10-CM

## 2024-12-01 DIAGNOSIS — I2510 Atherosclerotic heart disease of native coronary artery without angina pectoris: Secondary | ICD-10-CM

## 2024-12-01 DIAGNOSIS — J90 Pleural effusion, not elsewhere classified: Secondary | ICD-10-CM | POA: Insufficient documentation

## 2024-12-01 DIAGNOSIS — E1159 Type 2 diabetes mellitus with other circulatory complications: Secondary | ICD-10-CM

## 2024-12-01 DIAGNOSIS — I2699 Other pulmonary embolism without acute cor pulmonale: Secondary | ICD-10-CM | POA: Insufficient documentation

## 2024-12-01 DIAGNOSIS — J810 Acute pulmonary edema: Secondary | ICD-10-CM

## 2024-12-01 DIAGNOSIS — E785 Hyperlipidemia, unspecified: Secondary | ICD-10-CM | POA: Insufficient documentation

## 2024-12-01 DIAGNOSIS — R7989 Other specified abnormal findings of blood chemistry: Secondary | ICD-10-CM

## 2024-12-01 DIAGNOSIS — I4891 Unspecified atrial fibrillation: Secondary | ICD-10-CM | POA: Insufficient documentation

## 2024-12-01 DIAGNOSIS — Z794 Long term (current) use of insulin: Secondary | ICD-10-CM | POA: Diagnosis not present

## 2024-12-01 DIAGNOSIS — I130 Hypertensive heart and chronic kidney disease with heart failure and stage 1 through stage 4 chronic kidney disease, or unspecified chronic kidney disease: Secondary | ICD-10-CM | POA: Diagnosis not present

## 2024-12-01 DIAGNOSIS — I21A1 Myocardial infarction type 2: Secondary | ICD-10-CM | POA: Insufficient documentation

## 2024-12-01 DIAGNOSIS — R7401 Elevation of levels of liver transaminase levels: Secondary | ICD-10-CM | POA: Insufficient documentation

## 2024-12-01 DIAGNOSIS — E109 Type 1 diabetes mellitus without complications: Secondary | ICD-10-CM

## 2024-12-01 DIAGNOSIS — Z79899 Other long term (current) drug therapy: Secondary | ICD-10-CM | POA: Diagnosis not present

## 2024-12-01 DIAGNOSIS — I509 Heart failure, unspecified: Secondary | ICD-10-CM

## 2024-12-01 DIAGNOSIS — I3139 Other pericardial effusion (noninflammatory): Principal | ICD-10-CM

## 2024-12-01 DIAGNOSIS — R0602 Shortness of breath: Secondary | ICD-10-CM | POA: Diagnosis present

## 2024-12-01 HISTORY — DX: Unspecified atrial fibrillation: I48.91

## 2024-12-01 LAB — BASIC METABOLIC PANEL WITH GFR
Anion gap: 9 (ref 5–15)
BUN: 25 mg/dL — ABNORMAL HIGH (ref 8–23)
CO2: 24 mmol/L (ref 22–32)
Calcium: 8.9 mg/dL (ref 8.9–10.3)
Chloride: 111 mmol/L (ref 98–111)
Creatinine, Ser: 1.7 mg/dL — ABNORMAL HIGH (ref 0.61–1.24)
GFR, Estimated: 41 mL/min — ABNORMAL LOW (ref >60)
Glucose, Bld: 141 mg/dL — ABNORMAL HIGH (ref 70–99)
Potassium: 4.3 mmol/L (ref 3.5–5.1)
Sodium: 144 mmol/L (ref 135–145)

## 2024-12-01 LAB — CBC
HCT: 39.3 % (ref 39.0–52.0)
Hemoglobin: 13.7 g/dL (ref 13.0–17.0)
MCH: 26 pg (ref 26.0–34.0)
MCHC: 34.9 g/dL (ref 30.0–36.0)
MCV: 74.7 fL — ABNORMAL LOW (ref 80.0–100.0)
Platelets: 213 K/uL (ref 150–400)
RBC: 5.26 MIL/uL (ref 4.22–5.81)
RDW: 16.2 % — ABNORMAL HIGH (ref 11.5–15.5)
WBC: 6.7 K/uL (ref 4.0–10.5)
nRBC: 0 % (ref 0.0–0.2)

## 2024-12-01 LAB — HEPATIC FUNCTION PANEL
ALT: 72 U/L — ABNORMAL HIGH (ref 0–44)
AST: 59 U/L — ABNORMAL HIGH (ref 15–41)
Albumin: 3.6 g/dL (ref 3.5–5.0)
Alkaline Phosphatase: 127 U/L — ABNORMAL HIGH (ref 38–126)
Bilirubin, Direct: 0.3 mg/dL — ABNORMAL HIGH (ref 0.0–0.2)
Indirect Bilirubin: 0.3 mg/dL (ref 0.3–0.9)
Total Bilirubin: 0.7 mg/dL (ref 0.0–1.2)
Total Protein: 5.6 g/dL — ABNORMAL LOW (ref 6.5–8.1)

## 2024-12-01 LAB — TROPONIN T, HIGH SENSITIVITY: Troponin T High Sensitivity: 71 ng/L — ABNORMAL HIGH (ref 0–19)

## 2024-12-01 NOTE — Telephone Encounter (Signed)
 Spoke with pt wife, aware of the recommendations. She reports he will not go to the ER and they will wait for his follow up appointment.

## 2024-12-01 NOTE — ED Triage Notes (Signed)
 Pt sent here by doctor due to US  earlier today showing acute chole. Pt c.o sob at this time, no abd pain. Wife states pt has been hallucinating.

## 2024-12-01 NOTE — Telephone Encounter (Signed)
 I would not restart any other acetylcholine esterase inhibitor like Aricept . The hallucinations might be part of his dementia. Continue with reorientation but if hallucinations are bothersome to patient and causing anxiety, we should see them sooner. Thanks

## 2024-12-01 NOTE — Telephone Encounter (Signed)
 FYI - Received call from on call nurse with results of ultrasound. Abdominal ultrasound today revealed changes that appeared to be c/w acute cholecystitis. Called and spoke to Ms Tsutsui and Mr Whidbee. He has not been feeling well the last couple of days. Wife reports some increased sob and pt not himself. Discussed the need for ER evaluation. Pt and wife in agreement. Wife wants to transport. Discussed if any problems, to call 911 for transport. ER ([facility]) notified.

## 2024-12-01 NOTE — Telephone Encounter (Signed)
 Spoke to pts wife and relayed message.  She verbalized understanding.  She will call back as needed.  I made yearly appt as well.  She appreciated call back.

## 2024-12-01 NOTE — Telephone Encounter (Signed)
 I called wife of pt.  He took the donepezil  5mg  po at bedtime for about a week.  Noted  had vivid dreams, unsteadiness, having daily non scary hallucinations.  Since stopped donepezil  continues with unsteadiness, daily hallucinations.  Questioning what next?

## 2024-12-01 NOTE — ED Notes (Addendum)
 Patient transported to X-ray, will be brought to 27 when scan completed.

## 2024-12-02 ENCOUNTER — Emergency Department (HOSPITAL_COMMUNITY)

## 2024-12-02 ENCOUNTER — Inpatient Hospital Stay (HOSPITAL_COMMUNITY)

## 2024-12-02 ENCOUNTER — Other Ambulatory Visit (HOSPITAL_COMMUNITY): Payer: Self-pay

## 2024-12-02 ENCOUNTER — Telehealth: Payer: Self-pay | Admitting: Dietician

## 2024-12-02 ENCOUNTER — Other Ambulatory Visit: Payer: Self-pay

## 2024-12-02 DIAGNOSIS — I509 Heart failure, unspecified: Secondary | ICD-10-CM | POA: Diagnosis not present

## 2024-12-02 DIAGNOSIS — J810 Acute pulmonary edema: Secondary | ICD-10-CM

## 2024-12-02 LAB — COMPREHENSIVE METABOLIC PANEL WITH GFR
ALT: 71 U/L — ABNORMAL HIGH (ref 0–44)
AST: 57 U/L — ABNORMAL HIGH (ref 15–41)
Albumin: 3.4 g/dL — ABNORMAL LOW (ref 3.5–5.0)
Alkaline Phosphatase: 106 U/L (ref 38–126)
Anion gap: 9 (ref 5–15)
BUN: 24 mg/dL — ABNORMAL HIGH (ref 8–23)
CO2: 22 mmol/L (ref 22–32)
Calcium: 8.6 mg/dL — ABNORMAL LOW (ref 8.9–10.3)
Chloride: 111 mmol/L (ref 98–111)
Creatinine, Ser: 1.57 mg/dL — ABNORMAL HIGH (ref 0.61–1.24)
GFR, Estimated: 45 mL/min — ABNORMAL LOW (ref >60)
Glucose, Bld: 104 mg/dL — ABNORMAL HIGH (ref 70–99)
Potassium: 4.1 mmol/L (ref 3.5–5.1)
Sodium: 142 mmol/L (ref 135–145)
Total Bilirubin: 0.8 mg/dL (ref 0.0–1.2)
Total Protein: 5.2 g/dL — ABNORMAL LOW (ref 6.5–8.1)

## 2024-12-02 LAB — RESP PANEL BY RT-PCR (RSV, FLU A&B, COVID)  RVPGX2
Influenza A by PCR: NEGATIVE
Influenza B by PCR: NEGATIVE
Resp Syncytial Virus by PCR: NEGATIVE
SARS Coronavirus 2 by RT PCR: NEGATIVE

## 2024-12-02 LAB — TROPONIN T, HIGH SENSITIVITY: Troponin T High Sensitivity: 66 ng/L — ABNORMAL HIGH (ref 0–19)

## 2024-12-02 LAB — CBC
HCT: 37.9 % — ABNORMAL LOW (ref 39.0–52.0)
Hemoglobin: 13.4 g/dL (ref 13.0–17.0)
MCH: 26.4 pg (ref 26.0–34.0)
MCHC: 35.4 g/dL (ref 30.0–36.0)
MCV: 74.6 fL — ABNORMAL LOW (ref 80.0–100.0)
Platelets: 191 K/uL (ref 150–400)
RBC: 5.08 MIL/uL (ref 4.22–5.81)
RDW: 15.8 % — ABNORMAL HIGH (ref 11.5–15.5)
WBC: 5.4 K/uL (ref 4.0–10.5)
nRBC: 0 % (ref 0.0–0.2)

## 2024-12-02 LAB — CBG MONITORING, ED: Glucose-Capillary: 205 mg/dL — ABNORMAL HIGH (ref 70–99)

## 2024-12-02 LAB — PRO BRAIN NATRIURETIC PEPTIDE: Pro Brain Natriuretic Peptide: 3538 pg/mL — ABNORMAL HIGH (ref <300.0)

## 2024-12-02 MED ORDER — LORATADINE 10 MG PO TABS
10.0000 mg | ORAL_TABLET | Freq: Once | ORAL | Status: AC
  Administered 2024-12-02: 03:00:00 10 mg via ORAL
  Filled 2024-12-02: qty 1

## 2024-12-02 MED ORDER — ALBUTEROL SULFATE (2.5 MG/3ML) 0.083% IN NEBU: 2.5000 mg | INHALATION_SOLUTION | RESPIRATORY_TRACT | Status: DC | PRN

## 2024-12-02 MED ORDER — INSULIN ASPART 100 UNIT/ML IJ SOLN: 0.0000 [IU] | Freq: Every day | INTRAMUSCULAR | Status: DC

## 2024-12-02 MED ORDER — POTASSIUM CHLORIDE CRYS ER 20 MEQ PO TBCR
20.0000 meq | EXTENDED_RELEASE_TABLET | Freq: Every day | ORAL | 0 refills | Status: DC
  Filled 2024-12-02: qty 30, 30d supply, fill #0

## 2024-12-02 MED ORDER — FUROSEMIDE 10 MG/ML IJ SOLN: 40.0000 mg | Freq: Two times a day (BID) | INTRAMUSCULAR | Status: DC

## 2024-12-02 MED ORDER — FUROSEMIDE 40 MG PO TABS
40.0000 mg | ORAL_TABLET | Freq: Every day | ORAL | 0 refills | Status: DC
  Filled 2024-12-02: qty 30, 30d supply, fill #0

## 2024-12-02 MED ORDER — INSULIN GLARGINE 100 UNIT/ML ~~LOC~~ SOLN
10.0000 [IU] | Freq: Every day | SUBCUTANEOUS | Status: DC
  Administered 2024-12-02: 11:00:00 10 [IU] via SUBCUTANEOUS
  Filled 2024-12-02: qty 0.1

## 2024-12-02 MED ORDER — ACETAMINOPHEN 650 MG RE SUPP: 650.0000 mg | Freq: Four times a day (QID) | RECTAL | Status: DC | PRN

## 2024-12-02 MED ORDER — HALOPERIDOL LACTATE 5 MG/ML IJ SOLN
2.0000 mg | Freq: Four times a day (QID) | INTRAMUSCULAR | Status: DC | PRN
  Administered 2024-12-02: 11:00:00 2 mg via INTRAVENOUS
  Filled 2024-12-02: qty 1

## 2024-12-02 MED ORDER — INSULIN ASPART 100 UNIT/ML IJ SOLN
0.0000 [IU] | Freq: Three times a day (TID) | INTRAMUSCULAR | Status: DC
  Administered 2024-12-02: 12:00:00 3 [IU] via SUBCUTANEOUS
  Filled 2024-12-02: qty 3

## 2024-12-02 MED ORDER — FUROSEMIDE 10 MG/ML IJ SOLN
60.0000 mg | Freq: Two times a day (BID) | INTRAMUSCULAR | Status: DC
  Administered 2024-12-02: 06:00:00 60 mg via INTRAVENOUS
  Filled 2024-12-02: qty 6

## 2024-12-02 MED ORDER — ONDANSETRON HCL 4 MG/2ML IJ SOLN: 4.0000 mg | Freq: Four times a day (QID) | INTRAMUSCULAR | Status: DC | PRN

## 2024-12-02 MED ORDER — CARVEDILOL 12.5 MG PO TABS
12.5000 mg | ORAL_TABLET | Freq: Two times a day (BID) | ORAL | 0 refills | Status: DC
  Filled 2024-12-02: qty 60, 30d supply, fill #0
  Filled 2024-12-02: qty 120, 30d supply, fill #0

## 2024-12-02 MED ORDER — HALOPERIDOL 1 MG PO TABS: 2.0000 mg | ORAL_TABLET | Freq: Four times a day (QID) | ORAL | Status: DC | PRN

## 2024-12-02 MED ORDER — IOHEXOL 350 MG/ML SOLN
75.0000 mL | Freq: Once | INTRAVENOUS | Status: AC | PRN
  Administered 2024-12-02: 75 mL via INTRAVENOUS

## 2024-12-02 MED ORDER — ACETAMINOPHEN 325 MG PO TABS: 650.0000 mg | ORAL_TABLET | Freq: Four times a day (QID) | ORAL | Status: DC | PRN

## 2024-12-02 MED ORDER — ONDANSETRON HCL 4 MG PO TABS: 4.0000 mg | ORAL_TABLET | Freq: Four times a day (QID) | ORAL | Status: DC | PRN

## 2024-12-02 MED ADMIN — Lisinopril Tab 2.5 MG: 2.5 mg | ORAL | @ 12:00:00 | NDC 43547035110

## 2024-12-02 MED ADMIN — Carvedilol Tab 3.125 MG: 6.25 mg | ORAL | @ 12:00:00 | NDC 00904730561

## 2024-12-02 MED ADMIN — Apixaban Tab 5 MG: 5 mg | ORAL | @ 12:00:00 | NDC 00003089431

## 2024-12-02 MED FILL — Lisinopril Tab 2.5 MG: 2.5000 mg | ORAL | Qty: 1 | Status: AC

## 2024-12-02 MED FILL — Medication: SUBCUTANEOUS | Qty: 1 | Status: AC

## 2024-12-02 MED FILL — Apixaban Tab 5 MG: 5.0000 mg | ORAL | Qty: 1 | Status: AC

## 2024-12-02 MED FILL — Carvedilol Tab 3.125 MG: 6.2500 mg | ORAL | Qty: 2 | Status: AC

## 2024-12-02 NOTE — H&P (Addendum)
 History and Physical    Christopher [name] FMW:[id number] DOB: [birth year] DOA: 12/01/2024  PCP: Merna Huxley, NP  Patient coming from: home  I have personally briefly reviewed patient's old medical records in [facility]  Chief Complaint: abnormal imaging , SOB  HPI: Christopher [name] is a 78 y.o. male with medical history significant of  Atrial fibrillation on Eliquis , Dementia, DMII,GERD, CKDIIIB,Glaucoma , HLD, HTN who presents to ED after follow up with his pcp for complaints of sob. It appears at that visit a RUQ was performed and patient was found to have ? Abnormality of the Gall bladder in setting of abnormal lfts and due to to this was sent to ED. Patient however noted increase sob . Per wife patient sob has gotten worse in the last 3 days. NO no fever/chills / n/v .   ED Course:   Patient in ED was found to have Acute CHF as well as trace pericardial effusion. Patient admitted for acute CHF exacerbation.   Labs wbc 6.7/ hgb 13.7, plt 213  Na 144, K 4.3, Cl 111, glu 141, cr 1.7( at baseline) CE 71,66 BNP 3538 Ast 59, alt 72, alkphos 127  CXR IMPRESSION: 1. Mild vascular congestion without significant interstitial edema. 2. Mild bibasilar atelectasis with a small right-sided pleural effusion.   RUQ IMPRESSION: 1. Gallbladder wall thickening with pericholecystic fluid, without sonographic Murphy sign. Findings suggestive of acalculous acute cholecystitis. PRA call report. 2. Heterogeneous starry sky appearance of the hepatic parenchyma. Findings can be seen in the setting of hepatitis.   CTPE IMPRESSION: 1. No pulmonary embolism. 2. Right perihilar ground-glass pulmonary infiltrate and mild peribronchovascular interstitial thickening, favoring asymmetric perihilar pulmonary edema in the setting of early cardiogenic failure. 3. Moderate right and small left pleural effusions with associated bibasilar atelectasis. 4. Mild global cardiomegaly and trace pericardial  effusion. 5. Extensive multivessel coronary artery calcification. 6. Small hiatal hernia. Patient in ED found to have Acute CHF as well as tracepericardial effusion Review of Systems: As per HPI otherwise 10 point review of systems negative.   Past Medical History:  Diagnosis Date   Atrial fibrillation (HCC)    Dementia (HCC)    Diabetes mellitus type II    GERD (gastroesophageal reflux disease)    Glaucoma    Hyperlipidemia    Hypertension     Past Surgical History:  Procedure Laterality Date   COLONOSCOPY     HERNIA REPAIR     PATELLA FRACTURE SURGERY  1993     reports that he has never smoked. He has never used smokeless tobacco. He reports that he does not drink alcohol and does not use drugs.  Allergies[1]  Family History  Problem Relation Age of Onset   Alcohol abuse Father    Diabetes Sister    Diabetes Brother    Diabetes Brother    Colon cancer Neg Hx    Esophageal cancer Neg Hx    Stomach cancer Neg Hx    Rectal cancer Neg Hx     Prior to Admission medications  Medication Sig Start Date End Date Taking? Authorizing Provider  apixaban  (ELIQUIS ) 5 MG TABS tablet Take 1 tablet (5 mg total) by mouth 2 (two) times daily. 11/01/24  Yes Dunn, Dayna N, PA-C  carvedilol  (COREG ) 6.25 MG tablet Take 1 tablet (6.25 mg total) by mouth 2 (two) times daily. 11/01/24  Yes Dunn, Dayna N, PA-C  Continuous Glucose Sensor (DEXCOM G6 SENSOR) MISC Change every 10 days 08/03/24  Yes Thapa,  Sudan, MD  insulin  aspart (NOVOLOG ) 100 UNIT/ML injection INJECT UP TO 50 UNITS SUBCUTANEOUSLY ONCE DAILY IN INSULIN  PUMP, 07/14/24  Yes Thapa, [place], MD  Insulin  Disposable Pump (OMNIPOD 5 DEXG7G6 PODS GEN 5) MISC CHANGE POD EVERY 3 DAYS 10/06/24  Yes Thapa, Sudan, MD  latanoprost (XALATAN) 0.005 % ophthalmic solution Place 1 drop into both eyes at bedtime. 11/13/24  Yes [provider]  lisinopril  (ZESTRIL ) 2.5 MG tablet Take 1 tablet by mouth once daily 09/15/24  Yes Nafziger, Cory, NP   magnesium  oxide (MAG-OX) 400 MG tablet Take 1 tablet (400 mg total) by mouth daily. 11/26/24  Yes Dunn, Dayna N, PA-C  Multiple Vitamins-Minerals (MULTIVITAMIN ADULTS 50+) TABS Take 1 tablet by mouth daily.   Yes [provider]  atorvastatin  (LIPITOR ) 80 MG tablet Take 1 tablet (80 mg total) by mouth at bedtime. Patient not taking: Reported on 12/02/2024 06/09/24   Swinyer, Rosaline HERO, NP  Continuous Glucose Transmitter (DEXCOM G6 TRANSMITTER) MISC Use every 3 months 08/03/24   Thapa, Sudan, MD  insulin  aspart (NOVOLOG  FLEXPEN) 100 UNIT/ML FlexPen Inject 5 Units into the skin 3 (three) times daily with meals. To use as a backup when insulin  pump is not working. 11/03/24   Thapa, Sudan, MD  Insulin  Disposable Pump (OMNIPOD 5 G6 INTRO, GEN 5,) KIT Inject into the skin. 12/10/22   [provider]  insulin  glargine (LANTUS  SOLOSTAR) 100 UNIT/ML Solostar Pen Inject 20 Units into the skin daily. To use when insulin  pump is not working, as a backup. 11/03/24   Thapa, Sudan, MD  Insulin  Pen Needle 32G X 4 MM MISC Use 4x a day with insulin  pens to administer insulin  11/03/24   Thapa, Sudan, MD  NOVOLOG  100 UNIT/ML injection INJECT UP TO 60 UNITS SUBCUTANEOUSLY ONCE DAILY IN  INSULIN   PUMP 08/03/24   Thapa, Sudan, MD    Physical Exam: Vitals:   12/02/24 0155 12/02/24 0200 12/02/24 0203 12/02/24 0215  BP:      Pulse:  (!) 31 80 (!) 43  Resp: 16 16 (!) 27 (!) 21  Temp:      SpO2:  95% 100% 100%  Weight:      Height:        Constitutional: NAD, calm, comfortable Vitals:   12/02/24 0155 12/02/24 0200 12/02/24 0203 12/02/24 0215  BP:      Pulse:  (!) 31 80 (!) 43  Resp: 16 16 (!) 27 (!) 21  Temp:      SpO2:  95% 100% 100%  Weight:      Height:       Eyes: PERRL, lids and conjunctivae normal ENMT: Mucous membranes are moist. Posterior pharynx clear of any exudate or lesions.Normal dentition.  Neck: normal, supple, no masses, no thyromegaly Respiratory: clear to auscultation  bilaterally, no wheezing, no crackles. Normal respiratory effort. No accessory muscle use.  Cardiovascular: Regular rate and rhythm, no murmurs / rubs / gallops. No extremity edema. 2+ pedal pulses. Abdomen: no tenderness, no masses palpated. No hepatosplenomegaly. Bowel sounds positive.  Musculoskeletal: no clubbing / cyanosis. No joint deformity upper and lower extremities. Good ROM, no contractures. Normal muscle tone.  Skin: no rashes, lesions, ulcers. No induration Neurologic: CN 2-12 grossly intact. Sensation intact, Strength 5/5 in all 4.  Psychiatric: not able to assess at this time due to lethargy  Labs on Admission: I have personally reviewed following labs and imaging studies  CBC: Recent Labs  Lab 12/01/24 2244  WBC 6.7  HGB 13.7  HCT 39.3  MCV 74.7*  PLT 213   Basic Metabolic Panel: Recent Labs  Lab 12/01/24 2244  NA 144  K 4.3  CL 111  CO2 24  GLUCOSE 141*  BUN 25*  CREATININE 1.70*  CALCIUM  8.9   GFR: Estimated Creatinine Clearance: 44.7 mL/min (A) (by C-G formula based on SCr of 1.7 mg/dL (H)). Liver Function Tests: Recent Labs  Lab 12/01/24 2244  AST 59*  ALT 72*  ALKPHOS 127*  BILITOT 0.7  PROT 5.6*  ALBUMIN 3.6   No results for input(s): LIPASE, AMYLASE in the last 168 hours. No results for input(s): AMMONIA in the last 168 hours. Coagulation Profile: No results for input(s): INR, PROTIME in the last 168 hours. Cardiac Enzymes: No results for input(s): CKTOTAL, CKMB, CKMBINDEX, TROPONINI in the last 168 hours. BNP (last 3 results) Recent Labs    12/02/24 0035  PROBNP 3,538.0*   HbA1C: No results for input(s): HGBA1C in the last 72 hours. CBG: No results for input(s): GLUCAP in the last 168 hours. Lipid Profile: No results for input(s): CHOL, HDL, LDLCALC, TRIG, CHOLHDL, LDLDIRECT in the last 72 hours. Thyroid  Function Tests: No results for input(s): TSH, T4TOTAL, FREET4, T3FREE, THYROIDAB  in the last 72 hours. Anemia Panel: No results for input(s): VITAMINB12, FOLATE, FERRITIN, TIBC, IRON, RETICCTPCT in the last 72 hours. Urine analysis:    Component Value Date/Time   COLORURINE YELLOW 11/06/2024 1929   APPEARANCEUR CLEAR 11/06/2024 1929   LABSPEC 1.013 11/06/2024 1929   PHURINE 5.0 11/06/2024 1929   GLUCOSEU NEGATIVE 11/06/2024 1929   GLUCOSEU 500 08/25/2013 1450   HGBUR NEGATIVE 11/06/2024 1929   HGBUR negative 02/06/2009 1156   BILIRUBINUR NEGATIVE 11/06/2024 1929   BILIRUBINUR N 03/25/2017 1053   KETONESUR NEGATIVE 11/06/2024 1929   PROTEINUR 30 (A) 11/06/2024 1929   UROBILINOGEN 0.2 03/25/2017 1053   UROBILINOGEN 0.2 08/25/2013 1450   NITRITE NEGATIVE 11/06/2024 1929   LEUKOCYTESUR NEGATIVE 11/06/2024 1929    Radiological Exams on Admission: CT Angio Chest PE W and/or Wo Contrast Result Date: 12/02/2024 EXAM: CTA CHEST 12/02/2024 12:12:00 AM TECHNIQUE: CTA of the chest was performed after the administration of intravenous contrast. Multiplanar reformatted images are provided for review. MIP images are provided for review. Automated exposure control, iterative reconstruction, and/or weight based adjustment of the mA/kV was utilized to reduce the radiation dose to as low as reasonably achievable. COMPARISON: None available. CLINICAL HISTORY: Syncope/presyncope, cerebrovascular cause suspected. FINDINGS: PULMONARY ARTERIES: Pulmonary arteries are adequately opacified for evaluation. Central pulmonary arteries are of normal caliber. No pulmonary embolism. MEDIASTINUM: Extensive multivessel coronary artery calcification. Mild global cardiomegaly. Trace pericardial effusion. Mild atherosclerotic calcification within the thoracic aorta. The esophagus is unremarkable. Small hiatal hernia. Visualized thyroid  is unremarkable. LYMPH NODES: No mediastinal, hilar or axillary lymphadenopathy. LUNGS AND PLEURA: Moderate right and small left pleural effusions are present  with associated bibasilar atelectasis. Right perihilar ground-glass pulmonary infiltrate in mild thickening of the peribronchovascular interstitium, best appreciated within the lung bases can be seen in the setting of a typical infection and airway inflammation for asymmetric perihilar pulmonary edema, which is favored given the constellation of findings suggesting early cardiogenic failure. No pneumothorax. UPPER ABDOMEN: Limited images of the upper abdomen are unremarkable. SOFT TISSUES AND BONES: Osseous structures are age appropriate. No acute bone abnormality. No lytic or blastic bone lesion. No acute soft tissue abnormality. IMPRESSION: 1. No pulmonary embolism. 2. Right perihilar ground-glass pulmonary infiltrate and mild peribronchovascular interstitial thickening, favoring asymmetric perihilar pulmonary edema in  the setting of early cardiogenic failure. 3. Moderate right and small left pleural effusions with associated bibasilar atelectasis. 4. Mild global cardiomegaly and trace pericardial effusion. 5. Extensive multivessel coronary artery calcification. 6. Small hiatal hernia. Electronically signed by: Dorethia Molt MD 12/02/2024 12:21 AM EST RP Workstation: [id number]   DG Chest 2 View Result Date: 12/01/2024 EXAM: 2 VIEW(S) XRAY OF THE CHEST 12/01/2024 11:21:35 PM COMPARISON: 04/20/2023 CLINICAL HISTORY: shortness of breath since friday FINDINGS: LUNGS AND PLEURA: Mild vascular congestion is noted without significant interstitial edema. Mild bibasilar atelectasis is noted. Small right-sided pleural effusion. No pneumothorax. HEART AND MEDIASTINUM: Cardiomegaly. Calcified aorta. BONES AND SOFT TISSUES: No acute osseous abnormality. IMPRESSION: 1. Mild vascular congestion without significant interstitial edema. 2. Mild bibasilar atelectasis with a small right-sided pleural effusion. Electronically signed by: Oneil Devonshire MD 12/01/2024 11:30 PM EST RP Workstation: [id number]   US  Abdomen Limited RUQ  (LIVER/GB) Result Date: 12/01/2024 EXAM: Right Upper Quadrant Abdominal Ultrasound 12/01/2024 08:56:48 AM TECHNIQUE: Real-time ultrasonography of the right upper quadrant of the abdomen was performed. COMPARISON: None available. CLINICAL HISTORY: Elevated liver enzymes. FINDINGS: LIVER: Heterogeneous, starry sky appearance of the hepatic parenchyma. No intrahepatic biliary ductal dilatation. No evidence of mass. BILIARY SYSTEM: Gallbladder wall thickening and pericholecystic fluid. Negative sonographic Murphy sign. The gallbladder wall measures up to 8 mm. No cholelithiasis. Common bile duct is within normal limits measuring 4.6 mm. OTHER: No right upper quadrant ascites. Hepatopetal flow in the portal vein. IMPRESSION: 1. Gallbladder wall thickening with pericholecystic fluid, without sonographic Murphy sign. Findings suggestive of acalculous acute cholecystitis. PRA call report. 2. Heterogeneous starry sky appearance of the hepatic parenchyma. Findings can be seen in the setting of hepatitis. Electronically signed by: Morgane Naveau MD 12/01/2024 09:04 PM EST RP Workstation: [id number]    EKG: Independently reviewed.   Assessment/Plan Acute CHFref ( 45% ) exacerbation  -lasix  60 mg iv bid  -cycle ce, check tsh, echo in am  -place on chf protocol  -ekg without hyperacute st -twave changes  -please consult cardiology in am  Elevated LFTs  - possible multifactorial, due to hepato congestion in setting of borderline gall bladder abnormality  -surgery DR Redmond as been consulted  - MRCP and HIDA ordered  -check inflammatory markers   CKD IIIb - at around baseline 1.5- 1.7  Abn CE  - demand in the setting of Chf as well as uncontrolled HTN  - will f/u on echo  - cycle ce  -f/u on cardiology recs   Uncontrolled HTN  - resume home regimen  - prn hydralazine     Atrial fibrillation  -continue on Eliquis   DMII - resume insulin  pump  - monitor poc tid at bedtime   Dementia -not  on treatment   GERD -ppi   Glaucoma  -resume eye drops    HLD -diet controlled      DVT prophylaxis: Eliquis  Code Status: full/ as discussed per patient wishes in event of cardiac arrest  Family Communication:  Disposition Plan: full/ as discussed per patient wishes in event of cardiac arrest  Consults called: Cardiology, surgery- Dr Rubin  Admission status: progressive care    Camila DELENA Ned MD [facility]   If 7PM-7AM, please contact night-coverage [email or web address] Password [email or web address]  12/02/2024, 2:22 AM        [1] No Known Allergies

## 2024-12-02 NOTE — Consult Note (Signed)
 Reason for Consult: Cholecystitis Referring Physician: Dr. Johanna Pastor LERONE ONDER is an 78 y.o. male.  HPI: Patient is a 78 year old male, with history of A-fib-on Eliquis , hypertension, hyperlipidemia, CHF who comes in secondary to 1 week history of right-sided abdominal pain.  Patient and his wife states that he was seen by his PCP and underwent ultrasound.  There was concern for possible thickening of the gallbladder and cholecystitis.  Patient states he had no pain with eating during this week.  Denies any nausea or vomiting.  Patient was seen in the ER and underwent CT scan.  There was concern for cholecystitis.  There is also some elevated LFTs.    General surgery was consulted for further evaluation.  Patient currently has a HIDA scan pending  Past Medical History:  Diagnosis Date   Atrial fibrillation (HCC)    Dementia (HCC)    Diabetes mellitus type II    GERD (gastroesophageal reflux disease)    Glaucoma    Hyperlipidemia    Hypertension     Past Surgical History:  Procedure Laterality Date   COLONOSCOPY     HERNIA REPAIR     PATELLA FRACTURE SURGERY  1993    Family History  Problem Relation Age of Onset   Alcohol abuse Father    Diabetes Sister    Diabetes Brother    Diabetes Brother    Colon cancer Neg Hx    Esophageal cancer Neg Hx    Stomach cancer Neg Hx    Rectal cancer Neg Hx     Social History:  reports that he has never smoked. He has never used smokeless tobacco. He reports that he does not drink alcohol and does not use drugs.  Allergies: Allergies[1]  Medications: I have reviewed the patient's current medications.  Results for orders placed or performed during the hospital encounter of 12/01/24 (from the past 48 hours)  Basic metabolic panel     Status: Abnormal   Collection Time: 12/01/24 10:44 PM  Result Value Ref Range   Sodium 144 135 - 145 mmol/L   Potassium 4.3 3.5 - 5.1 mmol/L   Chloride 111 98 - 111 mmol/L   CO2 24 22 - 32 mmol/L    Glucose, Bld 141 (H) 70 - 99 mg/dL    Comment: Glucose reference range applies only to samples taken after fasting for at least 8 hours.   BUN 25 (H) 8 - 23 mg/dL   Creatinine, Ser 8.29 (H) 0.61 - 1.24 mg/dL   Calcium  8.9 8.9 - 10.3 mg/dL   GFR, Estimated 41 (L) >60 mL/min    Comment: (NOTE) Calculated using the CKD-EPI Creatinine Equation (2021)    Anion gap 9 5 - 15    Comment: Performed at [facility], [address], [place], [place] [address]  CBC     Status: Abnormal   Collection Time: 12/01/24 10:44 PM  Result Value Ref Range   WBC 6.7 4.0 - 10.5 K/uL   RBC 5.26 4.22 - 5.81 MIL/uL   Hemoglobin 13.7 13.0 - 17.0 g/dL   HCT 60.6 60.9 - 47.9 %   MCV 74.7 (L) 80.0 - 100.0 fL   MCH 26.0 26.0 - 34.0 pg   MCHC 34.9 30.0 - 36.0 g/dL   RDW 83.7 (H) 88.4 - 84.4 %   Platelets 213 150 - 400 K/uL   nRBC 0.0 0.0 - 0.2 %    Comment: Performed at [facility], [address] [place], [place]  [address]  Troponin T, High Sensitivity     Status: Abnormal   Collection Time: 12/01/24 10:44 PM  Result Value Ref Range   Troponin T High Sensitivity 71 (H) 0 - 19 ng/L    Comment: (NOTE) Biotin concentrations > 1000 ng/mL falsely decrease TnT results.  Serial cardiac troponin measurements are suggested.  Refer to the Links section for chest pain algorithms and additional  guidance. Performed at [facility], [address] [place], [place] [address]   Hepatic function panel     Status: Abnormal   Collection Time: 12/01/24 10:44 PM  Result Value Ref Range   Total Protein 5.6 (L) 6.5 - 8.1 g/dL   Albumin 3.6 3.5 - 5.0 g/dL   AST 59 (H) 15 - 41 U/L   ALT 72 (H) 0 - 44 U/L   Alkaline Phosphatase 127 (H) 38 - 126 U/L   Total Bilirubin 0.7 0.0 - 1.2 mg/dL   Bilirubin, Direct 0.3 (H) 0.0 - 0.2 mg/dL   Indirect Bilirubin 0.3 0.3 - 0.9 mg/dL    Comment: Performed at [facility], [address] [place], [place] [address]  Resp panel by RT-PCR (RSV, Flu A&B, Covid)  Anterior Nasal Swab     Status: None   Collection Time: 12/01/24 11:12 PM   Specimen: Anterior Nasal Swab  Result Value Ref Range   SARS Coronavirus 2 by RT PCR NEGATIVE NEGATIVE   Influenza A by PCR NEGATIVE NEGATIVE   Influenza B by PCR NEGATIVE NEGATIVE    Comment: (NOTE) The Xpert Xpress SARS-CoV-2/FLU/RSV plus assay is intended as an aid in the diagnosis of influenza from Nasopharyngeal swab specimens and should not be used as a sole basis for treatment. Nasal washings and aspirates are unacceptable for Xpert Xpress SARS-CoV-2/FLU/RSV testing.  Fact Sheet for Patients: bloggercourse.com  Fact Sheet for Healthcare Providers: seriousbroker.it  This test is not yet approved or cleared by the [place]  FDA and has been authorized for detection and/or diagnosis of SARS-CoV-2 by FDA under an Emergency Use Authorization (EUA). This EUA will remain in effect (meaning this test can be used) for the duration of the COVID-19 declaration under Section 564(b)(1) of the Act, 21 U.S.C. section 360bbb-3(b)(1), unless the authorization is terminated or revoked.     Resp Syncytial Virus by PCR NEGATIVE NEGATIVE    Comment: (NOTE) Fact Sheet for Patients: bloggercourse.com  Fact Sheet for Healthcare Providers: seriousbroker.it  This test is not yet approved or cleared by the [place]  FDA and has been authorized for detection and/or diagnosis of SARS-CoV-2 by FDA under an Emergency Use Authorization (EUA). This EUA will remain in effect (meaning this test can be used) for the duration of the COVID-19 declaration under Section 564(b)(1) of the Act, 21 U.S.C. section 360bbb-3(b)(1), unless the authorization is terminated or revoked.  Performed at [facility], [address] [place], [place] [address]   Pro Brain natriuretic peptide     Status: Abnormal   Collection Time:  12/02/24 12:35 AM  Result Value Ref Range   Pro Brain Natriuretic Peptide 3,538.0 (H) <300.0 pg/mL    Comment: (NOTE) Age Group        Cut-Points    Interpretation  < 50 years     450 pg/mL       NT-proBNP > 450 pg/mL indicates  ADHF is likely              50 to 75 years  900 pg/mL      NT-proBNP > 900 pg/mL indicates          ADHF is likely  > 75 years      1800 pg/mL     NT-proBNP > 1800 pg/mL indicates          ADHF is likely                           All ages    Results between       Indeterminate. Further clinical             300 and the cut-   information is needed to determine            point for age group   if ADHF is present.                                                             Elecsys proBNP II/ Elecsys proBNP II STAT           Cut-Point                       Interpretation  300 pg/mL                    NT-proBNP <300pg/mL indicates                             ADHF is not likely  Performed at [facility], [address] [place], [place] [address]   Troponin T, High Sensitivity     Status: Abnormal   Collection Time: 12/02/24 12:35 AM  Result Value Ref Range   Troponin T High Sensitivity 66 (H) 0 - 19 ng/L    Comment: (NOTE) Biotin concentrations > 1000 ng/mL falsely decrease TnT results.  Serial cardiac troponin measurements are suggested.  Refer to the Links section for chest pain algorithms and additional  guidance. Performed at [facility], [address], [place], [place] [address]   CBC     Status: Abnormal   Collection Time: 12/02/24  4:24 AM  Result Value Ref Range   WBC 5.4 4.0 - 10.5 K/uL   RBC 5.08 4.22 - 5.81 MIL/uL   Hemoglobin 13.4 13.0 - 17.0 g/dL   HCT 62.0 (L) 60.9 - 47.9 %   MCV 74.6 (L) 80.0 - 100.0 fL   MCH 26.4 26.0 - 34.0 pg   MCHC 35.4 30.0 - 36.0 g/dL   RDW 84.1 (H) 88.4 - 84.4 %   Platelets 191 150 - 400 K/uL   nRBC 0.0 0.0 - 0.2 %    Comment: Performed at [facility], [address], [place], [place] [address]  Comprehensive metabolic panel     Status: Abnormal   Collection Time: 12/02/24  4:24 AM  Result Value Ref Range   Sodium 142 135 - 145 mmol/L   Potassium 4.1 3.5 - 5.1 mmol/L   Chloride 111 98 - 111 mmol/L   CO2 22 22 - 32 mmol/L   Glucose,  Bld 104 (H) 70 - 99 mg/dL    Comment: Glucose reference range applies only to samples taken after fasting for at least 8 hours.   BUN 24 (H) 8 - 23 mg/dL   Creatinine, Ser 8.42 (H) 0.61 - 1.24 mg/dL   Calcium  8.6 (L) 8.9 - 10.3 mg/dL   Total Protein 5.2 (L) 6.5 - 8.1 g/dL   Albumin 3.4 (L) 3.5 - 5.0 g/dL   AST 57 (H) 15 - 41 U/L   ALT 71 (H) 0 - 44 U/L   Alkaline Phosphatase 106 38 - 126 U/L   Total Bilirubin 0.8 0.0 - 1.2 mg/dL   GFR, Estimated 45 (L) >60 mL/min    Comment: (NOTE) Calculated using the CKD-EPI Creatinine Equation (2021)    Anion gap 9 5 - 15    Comment: Performed at [facility], [address] [place], [place] [address]    CT Angio Chest PE W and/or Wo Contrast Result Date: 12/02/2024 EXAM: CTA CHEST 12/02/2024 12:12:00 AM TECHNIQUE: CTA of the chest was performed after the administration of intravenous contrast. Multiplanar reformatted images are provided for review. MIP images are provided for review. Automated exposure control, iterative reconstruction, and/or weight based adjustment of the mA/kV was utilized to reduce the radiation dose to as low as reasonably achievable. COMPARISON: None available. CLINICAL HISTORY: Syncope/presyncope, cerebrovascular cause suspected. FINDINGS: PULMONARY ARTERIES: Pulmonary arteries are adequately opacified for evaluation. Central pulmonary arteries are of normal caliber. No pulmonary embolism. MEDIASTINUM: Extensive multivessel coronary artery calcification. Mild global cardiomegaly. Trace pericardial effusion. Mild atherosclerotic calcification within the thoracic aorta. The esophagus is unremarkable. Small hiatal hernia. Visualized  thyroid  is unremarkable. LYMPH NODES: No mediastinal, hilar or axillary lymphadenopathy. LUNGS AND PLEURA: Moderate right and small left pleural effusions are present with associated bibasilar atelectasis. Right perihilar ground-glass pulmonary infiltrate in mild thickening of the peribronchovascular interstitium, best appreciated within the lung bases can be seen in the setting of a typical infection and airway inflammation for asymmetric perihilar pulmonary edema, which is favored given the constellation of findings suggesting early cardiogenic failure. No pneumothorax. UPPER ABDOMEN: Limited images of the upper abdomen are unremarkable. SOFT TISSUES AND BONES: Osseous structures are age appropriate. No acute bone abnormality. No lytic or blastic bone lesion. No acute soft tissue abnormality. IMPRESSION: 1. No pulmonary embolism. 2. Right perihilar ground-glass pulmonary infiltrate and mild peribronchovascular interstitial thickening, favoring asymmetric perihilar pulmonary edema in the setting of early cardiogenic failure. 3. Moderate right and small left pleural effusions with associated bibasilar atelectasis. 4. Mild global cardiomegaly and trace pericardial effusion. 5. Extensive multivessel coronary artery calcification. 6. Small hiatal hernia. Electronically signed by: Dorethia Molt MD 12/02/2024 12:21 AM EST RP Workstation: [id number]   DG Chest 2 View Result Date: 12/01/2024 EXAM: 2 VIEW(S) XRAY OF THE CHEST 12/01/2024 11:21:35 PM COMPARISON: 04/20/2023 CLINICAL HISTORY: shortness of breath since friday FINDINGS: LUNGS AND PLEURA: Mild vascular congestion is noted without significant interstitial edema. Mild bibasilar atelectasis is noted. Small right-sided pleural effusion. No pneumothorax. HEART AND MEDIASTINUM: Cardiomegaly. Calcified aorta. BONES AND SOFT TISSUES: No acute osseous abnormality. IMPRESSION: 1. Mild vascular congestion without significant interstitial edema. 2. Mild bibasilar  atelectasis with a small right-sided pleural effusion. Electronically signed by: Oneil Devonshire MD 12/01/2024 11:30 PM EST RP Workstation: [id number]   US  Abdomen Limited RUQ (LIVER/GB) Result Date: 12/01/2024 EXAM: Right Upper Quadrant Abdominal Ultrasound 12/01/2024 08:56:48 AM TECHNIQUE: Real-time ultrasonography of the right upper quadrant of the abdomen was performed. COMPARISON: None available. CLINICAL  HISTORY: Elevated liver enzymes. FINDINGS: LIVER: Heterogeneous, starry sky appearance of the hepatic parenchyma. No intrahepatic biliary ductal dilatation. No evidence of mass. BILIARY SYSTEM: Gallbladder wall thickening and pericholecystic fluid. Negative sonographic Murphy sign. The gallbladder wall measures up to 8 mm. No cholelithiasis. Common bile duct is within normal limits measuring 4.6 mm. OTHER: No right upper quadrant ascites. Hepatopetal flow in the portal vein. IMPRESSION: 1. Gallbladder wall thickening with pericholecystic fluid, without sonographic Murphy sign. Findings suggestive of acalculous acute cholecystitis. PRA call report. 2. Heterogeneous starry sky appearance of the hepatic parenchyma. Findings can be seen in the setting of hepatitis. Electronically signed by: Morgane Naveau MD 12/01/2024 09:04 PM EST RP Workstation: [id number]    Review of Systems  Constitutional:  Negative for chills and fever.  HENT:  Negative for ear discharge, hearing loss and sore throat.   Eyes:  Negative for discharge.  Respiratory:  Negative for cough and shortness of breath.   Cardiovascular:  Negative for chest pain and leg swelling.  Gastrointestinal:  Negative for abdominal pain, constipation, diarrhea, nausea and vomiting.  Musculoskeletal:  Negative for myalgias and neck pain.  Skin:  Negative for rash.  Allergic/Immunologic: Negative for environmental allergies.  Neurological:  Negative for dizziness and seizures.  Hematological:  Does not bruise/bleed easily.   Psychiatric/Behavioral:  Negative for suicidal ideas.   All other systems reviewed and are negative.  Blood pressure (!) 139/123, pulse (!) 43, temperature 98.2 F (36.8 C), temperature source Oral, resp. rate 13, height 6' 4 (1.93 m), weight 93 kg, SpO2 100%. Physical Exam Constitutional:      Appearance: He is well-developed.     Comments: Conversant No acute distress  HENT:     Head: Normocephalic and atraumatic.  Eyes:     General: Lids are normal. No scleral icterus.    Pupils: Pupils are equal, round, and reactive to light.     Comments: Pupils are equal round and reactive No lid lag Moist conjunctiva  Neck:     Thyroid : No thyromegaly.     Trachea: No tracheal tenderness.     Comments: No cervical lymphadenopathy Cardiovascular:     Rate and Rhythm: Normal rate and regular rhythm.     Heart sounds: No murmur heard. Pulmonary:     Effort: Pulmonary effort is normal.     Breath sounds: Normal breath sounds. No wheezing or rales.  Abdominal:     Tenderness: There is no abdominal tenderness.     Hernia: No hernia is present.  Musculoskeletal:     Cervical back: Normal range of motion and neck supple.  Skin:    General: Skin is warm.     Findings: No rash.     Nails: There is no clubbing.     Comments: Normal skin turgor  Neurological:     Mental Status: He is alert and oriented to person, place, and time.     Comments: Normal gait and station  Psychiatric:        Mood and Affect: Mood normal.        Thought Content: Thought content normal.        Judgment: Judgment normal.     Comments: Appropriate affect     Assessment/Plan: 78 year old male with elevated transaminases History of A-fib on Eliquis  Hypertension Diabetes CHF Right pleural effusion  1.  At this time I do not feel that he likely has cholecystitis.  Patient currently has a HIDA scan pending.  Will follow this up. 2.  Would  hold Eliquis  at this time in case patient is in need of surgery. 3.   Will follow along.  Lynda Leos 12/02/2024, 6:08 AM         [1] No Known Allergies

## 2024-12-02 NOTE — Telephone Encounter (Signed)
 Patient's wife called.  Patient was admitted to the hospital yesterday with CHF.  They had him remove his POD for his pump.  Wife states that patient reports being given 5 units of Lantus .  She asks when she should put on the pump and if she should do anything different with the pump if patient has been given the long acting insulin .  Patient will be discharged today.  Advised patient to confirm last insulin  doses with RN at time of discharge. If the Lantus  was given in the last 24 hours, then a POD can be placed and we can temporarily stop the basal rate and I can teach her to do this. If the Novolog  was given, he can have further Novolog  dose before his next meal if this is >3 hours after the last dose.  A POD also can be placed.  Wife states she may wait to put on another POD until morning.  Instructed her that if she decides to do this, patient should take Novolog  injections per hospital instructions before each meal.  She has my office and cell number for questions.  Leita Constable, RD, LDN, CDCES, DipACLM

## 2024-12-02 NOTE — ED Notes (Signed)
 Wife educated on discharge paperwork and prescriptions. Denies any questions at this time.

## 2024-12-02 NOTE — ED Provider Notes (Signed)
 [place] EMERGENCY DEPARTMENT AT [facility] Provider Note   CSN: [id number] Arrival date & time: 12/01/24  2221     Patient presents with: Shortness of Breath   Christopher [name] [name] is Christopher [name] 78 y.o. male.   The history is provided by the patient, the spouse and Christopher [name] relative (granddaughter Benton, patient has given permission to speak to family). The history is limited by the condition of the patient (level 5 caveat dementia).  Shortness of Breath Severity:  Moderate Onset quality:  Gradual Timing:  Constant Progression:  Worsening Chronicity:  New Context: not URI   Relieved by:  Nothing Worsened by:  Nothing Ineffective treatments:  None tried Associated symptoms: no abdominal pain, no chest pain, no fever, no PND and no wheezing   Associated symptoms comment:  BLE edema Risk factors: no recent surgery and no tobacco use   Patient with HTN and Afib and EF of 40-45% on recent echo presents due to Christopher [name] positive US  of the GB by PMD.  Patient's complaint is not actually abdominal pain but SOB that is progressive with BLE edema.      Past Medical History:  Diagnosis Date   Atrial fibrillation (HCC)    Dementia (HCC)    Diabetes mellitus type II    GERD (gastroesophageal reflux disease)    Glaucoma    Hyperlipidemia    Hypertension      Prior to Admission medications  Medication Sig Start Date End Date Taking? Authorizing Provider  apixaban  (ELIQUIS ) 5 MG TABS tablet Take 1 tablet (5 mg total) by mouth 2 (two) times daily. 11/01/24  Yes Dunn, Dayna N, PA-C  carvedilol  (COREG ) 6.25 MG tablet Take 1 tablet (6.25 mg total) by mouth 2 (two) times daily. 11/01/24  Yes Dunn, Dayna N, PA-C  Continuous Glucose Sensor (DEXCOM G6 SENSOR) MISC Change every 10 days 08/03/24  Yes Thapa, Sudan, MD  insulin  aspart (NOVOLOG  FLEXPEN) 100 UNIT/ML FlexPen Inject 5 Units into the skin 3 (three) times daily with meals. To use as Christopher [name] backup when insulin  pump is not working. 11/03/24  Yes Thapa,  [place], MD  insulin  glargine (LANTUS  SOLOSTAR) 100 UNIT/ML Solostar Pen Inject 20 Units into the skin daily. To use when insulin  pump is not working, as Christopher [name] backup. 11/03/24  Yes Thapa, [place], MD  latanoprost (XALATAN) 0.005 % ophthalmic solution Place 1 drop into both eyes at bedtime. 11/13/24  Yes [provider]  lisinopril  (ZESTRIL ) 2.5 MG tablet Take 1 tablet by mouth once daily 09/15/24  Yes Nafziger, Darleene, NP  magnesium  oxide (MAG-OX) 400 MG tablet Take 1 tablet (400 mg total) by mouth daily. 11/26/24  Yes Dunn, Dayna N, PA-C  Multiple Vitamins-Minerals (MULTIVITAMIN ADULTS 50+) TABS Take 1 tablet by mouth daily.   Yes [provider]  NOVOLOG  100 UNIT/ML injection INJECT UP TO 60 UNITS SUBCUTANEOUSLY ONCE DAILY IN  INSULIN   PUMP 08/03/24  Yes Thapa, [place], MD  Continuous Glucose Transmitter (DEXCOM G6 TRANSMITTER) MISC Use every 3 months 08/03/24   Thapa, Sudan, MD  Insulin  Disposable Pump (OMNIPOD 5 DEXG7G6 PODS GEN 5) MISC CHANGE POD EVERY 3 DAYS 10/06/24   Thapa, Sudan, MD  Insulin  Pen Needle 32G X 4 MM MISC Use 4x Christopher [name] day with insulin  pens to administer insulin  11/03/24   Thapa, Sudan, MD    Allergies: Patient has no known allergies.    Review of Systems  Unable to perform ROS: Dementia  Constitutional:  Negative for fever.  Respiratory:  Positive for shortness  of breath. Negative for wheezing.   Cardiovascular:  Positive for leg swelling. Negative for chest pain and PND.  Gastrointestinal:  Negative for abdominal pain.    Updated Vital Signs BP (!) 139/123   Pulse (!) 43   Temp 98.2 F (36.8 C) (Oral)   Resp 13   Ht 6' 4 (1.93 m)   Wt 93 kg   SpO2 100%   BMI 24.95 kg/m   Physical Exam Vitals and nursing note reviewed.  Constitutional:      General: He is not in acute distress.    Appearance: Normal appearance. He is well-developed. He is not diaphoretic.  HENT:     Head: Normocephalic and atraumatic.     Nose: Nose normal.  Eyes:      Conjunctiva/sclera: Conjunctivae normal.     Pupils: Pupils are equal, round, and reactive to light.  Cardiovascular:     Rate and Rhythm: Normal rate and regular rhythm.     Pulses: Normal pulses.     Heart sounds: Normal heart sounds.  Pulmonary:     Effort: Pulmonary effort is normal.     Breath sounds: Rhonchi present. No wheezing or rales.  Abdominal:     General: Bowel sounds are normal.     Palpations: Abdomen is soft.     Tenderness: There is no abdominal tenderness. There is no guarding or rebound.  Musculoskeletal:        General: Normal range of motion.     Cervical back: Normal range of motion and neck supple.     Right lower leg: Edema present.     Left lower leg: Edema present.  Skin:    General: Skin is warm and dry.     Capillary Refill: Capillary refill takes less than 2 seconds.  Neurological:     General: No focal deficit present.     Mental Status: He is alert and oriented to person, place, and time.     Deep Tendon Reflexes: Reflexes normal.  Psychiatric:        Mood and Affect: Mood normal.     (all labs ordered are listed, but only abnormal results are displayed) Results for orders placed or performed during the hospital encounter of 12/01/24  Basic metabolic panel   Collection Time: 12/01/24 10:44 PM  Result Value Ref Range   Sodium 144 135 - 145 mmol/L   Potassium 4.3 3.5 - 5.1 mmol/L   Chloride 111 98 - 111 mmol/L   CO2 24 22 - 32 mmol/L   Glucose, Bld 141 (H) 70 - 99 mg/dL   BUN 25 (H) 8 - 23 mg/dL   Creatinine, Ser 8.29 (H) 0.61 - 1.24 mg/dL   Calcium  8.9 8.9 - 10.3 mg/dL   GFR, Estimated 41 (L) >60 mL/min   Anion gap 9 5 - 15  CBC   Collection Time: 12/01/24 10:44 PM  Result Value Ref Range   WBC 6.7 4.0 - 10.5 K/uL   RBC 5.26 4.22 - 5.81 MIL/uL   Hemoglobin 13.7 13.0 - 17.0 g/dL   HCT 60.6 60.9 - 47.9 %   MCV 74.7 (L) 80.0 - 100.0 fL   MCH 26.0 26.0 - 34.0 pg   MCHC 34.9 30.0 - 36.0 g/dL   RDW 83.7 (H) 88.4 - 84.4 %   Platelets 213  150 - 400 K/uL   nRBC 0.0 0.0 - 0.2 %  Hepatic function panel   Collection Time: 12/01/24 10:44 PM  Result Value Ref Range   Total  Protein 5.6 (L) 6.5 - 8.1 g/dL   Albumin 3.6 3.5 - 5.0 g/dL   AST 59 (H) 15 - 41 U/L   ALT 72 (H) 0 - 44 U/L   Alkaline Phosphatase 127 (H) 38 - 126 U/L   Total Bilirubin 0.7 0.0 - 1.2 mg/dL   Bilirubin, Direct 0.3 (H) 0.0 - 0.2 mg/dL   Indirect Bilirubin 0.3 0.3 - 0.9 mg/dL  Troponin T, High Sensitivity   Collection Time: 12/01/24 10:44 PM  Result Value Ref Range   Troponin T High Sensitivity 71 (H) 0 - 19 ng/L  Resp panel by RT-PCR (RSV, Flu Christopher [name]&B, Covid) Anterior Nasal Swab   Collection Time: 12/01/24 11:12 PM   Specimen: Anterior Nasal Swab  Result Value Ref Range   SARS Coronavirus 2 by RT PCR NEGATIVE NEGATIVE   Influenza Christopher [name] by PCR NEGATIVE NEGATIVE   Influenza B by PCR NEGATIVE NEGATIVE   Resp Syncytial Virus by PCR NEGATIVE NEGATIVE  Pro Brain natriuretic peptide   Collection Time: 12/02/24 12:35 AM  Result Value Ref Range   Pro Brain Natriuretic Peptide 3,538.0 (H) <300.0 pg/mL  Troponin T, High Sensitivity   Collection Time: 12/02/24 12:35 AM  Result Value Ref Range   Troponin T High Sensitivity 66 (H) 0 - 19 ng/L  CBC   Collection Time: 12/02/24  4:24 AM  Result Value Ref Range   WBC 5.4 4.0 - 10.5 K/uL   RBC 5.08 4.22 - 5.81 MIL/uL   Hemoglobin 13.4 13.0 - 17.0 g/dL   HCT 62.0 (L) 60.9 - 47.9 %   MCV 74.6 (L) 80.0 - 100.0 fL   MCH 26.4 26.0 - 34.0 pg   MCHC 35.4 30.0 - 36.0 g/dL   RDW 84.1 (H) 88.4 - 84.4 %   Platelets 191 150 - 400 K/uL   nRBC 0.0 0.0 - 0.2 %  Comprehensive metabolic panel   Collection Time: 12/02/24  4:24 AM  Result Value Ref Range   Sodium 142 135 - 145 mmol/L   Potassium 4.1 3.5 - 5.1 mmol/L   Chloride 111 98 - 111 mmol/L   CO2 22 22 - 32 mmol/L   Glucose, Bld 104 (H) 70 - 99 mg/dL   BUN 24 (H) 8 - 23 mg/dL   Creatinine, Ser 8.42 (H) 0.61 - 1.24 mg/dL   Calcium  8.6 (L) 8.9 - 10.3 mg/dL   Total Protein  5.2 (L) 6.5 - 8.1 g/dL   Albumin 3.4 (L) 3.5 - 5.0 g/dL   AST 57 (H) 15 - 41 U/L   ALT 71 (H) 0 - 44 U/L   Alkaline Phosphatase 106 38 - 126 U/L   Total Bilirubin 0.8 0.0 - 1.2 mg/dL   GFR, Estimated 45 (L) >60 mL/min   Anion gap 9 5 - 15   CT Angio Chest PE W and/or Wo Contrast Result Date: 12/02/2024 EXAM: CTA CHEST 12/02/2024 12:12:00 AM TECHNIQUE: CTA of the chest was performed after the administration of intravenous contrast. Multiplanar reformatted images are provided for review. MIP images are provided for review. Automated exposure control, iterative reconstruction, and/or weight based adjustment of the mA/kV was utilized to reduce the radiation dose to as low as reasonably achievable. COMPARISON: None available. CLINICAL HISTORY: Syncope/presyncope, cerebrovascular cause suspected. FINDINGS: PULMONARY ARTERIES: Pulmonary arteries are adequately opacified for evaluation. Central pulmonary arteries are of normal caliber. No pulmonary embolism. MEDIASTINUM: Extensive multivessel coronary artery calcification. Mild global cardiomegaly. Trace pericardial effusion. Mild atherosclerotic calcification within the thoracic aorta. The esophagus is unremarkable.  Small hiatal hernia. Visualized thyroid  is unremarkable. LYMPH NODES: No mediastinal, hilar or axillary lymphadenopathy. LUNGS AND PLEURA: Moderate right and small left pleural effusions are present with associated bibasilar atelectasis. Right perihilar ground-glass pulmonary infiltrate in mild thickening of the peribronchovascular interstitium, best appreciated within the lung bases can be seen in the setting of Christopher [name] typical infection and airway inflammation for asymmetric perihilar pulmonary edema, which is favored given the constellation of findings suggesting early cardiogenic failure. No pneumothorax. UPPER ABDOMEN: Limited images of the upper abdomen are unremarkable. SOFT TISSUES AND BONES: Osseous structures are age appropriate. No acute bone  abnormality. No lytic or blastic bone lesion. No acute soft tissue abnormality. IMPRESSION: 1. No pulmonary embolism. 2. Right perihilar ground-glass pulmonary infiltrate and mild peribronchovascular interstitial thickening, favoring asymmetric perihilar pulmonary edema in the setting of early cardiogenic failure. 3. Moderate right and small left pleural effusions with associated bibasilar atelectasis. 4. Mild global cardiomegaly and trace pericardial effusion. 5. Extensive multivessel coronary artery calcification. 6. Small hiatal hernia. Electronically signed by: Dorethia Molt MD 12/02/2024 12:21 AM EST RP Workstation: [id number]   DG Chest 2 View Result Date: 12/01/2024 EXAM: 2 VIEW(S) XRAY OF THE CHEST 12/01/2024 11:21:35 PM COMPARISON: 04/20/2023 CLINICAL HISTORY: shortness of breath since friday FINDINGS: LUNGS AND PLEURA: Mild vascular congestion is noted without significant interstitial edema. Mild bibasilar atelectasis is noted. Small right-sided pleural effusion. No pneumothorax. HEART AND MEDIASTINUM: Cardiomegaly. Calcified aorta. BONES AND SOFT TISSUES: No acute osseous abnormality. IMPRESSION: 1. Mild vascular congestion without significant interstitial edema. 2. Mild bibasilar atelectasis with Christopher [name] small right-sided pleural effusion. Electronically signed by: Oneil Devonshire MD 12/01/2024 11:30 PM EST RP Workstation: [id number]   US  Abdomen Limited RUQ (LIVER/GB) Result Date: 12/01/2024 EXAM: Right Upper Quadrant Abdominal Ultrasound 12/01/2024 08:56:48 AM TECHNIQUE: Real-time ultrasonography of the right upper quadrant of the abdomen was performed. COMPARISON: None available. CLINICAL HISTORY: Elevated liver enzymes. FINDINGS: LIVER: Heterogeneous, starry sky appearance of the hepatic parenchyma. No intrahepatic biliary ductal dilatation. No evidence of mass. BILIARY SYSTEM: Gallbladder wall thickening and pericholecystic fluid. Negative sonographic Murphy sign. The gallbladder wall measures up to  8 mm. No cholelithiasis. Common bile duct is within normal limits measuring 4.6 mm. OTHER: No right upper quadrant ascites. Hepatopetal flow in the portal vein. IMPRESSION: 1. Gallbladder wall thickening with pericholecystic fluid, without sonographic Murphy sign. Findings suggestive of acalculous acute cholecystitis. PRA call report. 2. Heterogeneous starry sky appearance of the hepatic parenchyma. Findings can be seen in the setting of hepatitis. Electronically signed by: Morgane Naveau MD 12/01/2024 09:04 PM EST RP Workstation: [id number]   LONG TERM MONITOR (3-14 DAYS) Result Date: 11/25/2024 Cardiac monitor (Zio Patch): 11/05/2024 - 11/18/2024 Dominant rhythm atrial fibrillation, 100% burden. Heart rate 49-199 bpm.  Avg HR 85 bpm. No high grade AV block, pauses (3 seconds or longer). Total supraventricular ectopic burden 0% Total ventricular ectopic burden 28.4% (isolated 26.6%, couplets 1.8%). 3 auto triggered events noted NSVT. Longest episode of NSVT: 11/17/2024 at 4:22 PM, 12 beats, 4.4 seconds, average HR 169 bpm. Patient triggered events: 0.   ECHOCARDIOGRAM COMPLETE Result Date: 11/08/2024    ECHOCARDIOGRAM REPORT   Patient Name:   PAL SHELL Date of Exam: 11/08/2024 Medical Rec #:  [id number]      Height:       76.0 in Accession #:    [id number]     Weight:       208.0 lb Date of Birth:  Jul 25, 1946      BSA:  2.252 m Patient Age:    78 years       BP:           170/96 mmHg Patient Gender: M              HR:           76 bpm. Exam Location:  [address] Procedure: 2D Echo, Cardiac Doppler and Color Doppler (Both Spectral and Color            Flow Doppler were utilized during procedure). 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 TIA; Risk Factors:Hypertension and                 Dyslipidemia.  Sonographer:    Powell Saras Referring  Phys: 19 DAYNA N DUNN IMPRESSIONS  1. Left ventricular ejection fraction, by estimation, is 45 to 50%. The left ventricle has mildly decreased function. The left ventricle has no regional wall motion abnormalities. There is moderate concentric left ventricular hypertrophy. Left ventricular diastolic function could not be evaluated.  2. Right ventricular systolic function is mildly reduced. The right ventricular size is normal. There is moderately elevated pulmonary artery systolic pressure.  3. Left atrial size was severely dilated.  4. Right atrial size was severely dilated.  5. The mitral valve is normal in structure. Mild mitral valve regurgitation. No evidence of mitral stenosis.  6. Tricuspid valve regurgitation is mild to moderate.  7. The aortic valve is tricuspid. Aortic valve regurgitation is not visualized. No aortic stenosis is present.  8. Aortic dilatation noted. There is mild dilatation of the aortic root, measuring 42 mm.  9. The inferior vena cava is dilated in size with <50% respiratory variability, suggesting right atrial pressure of 15 mmHg. FINDINGS  Left Ventricle: Left ventricular ejection fraction, by estimation, is 45 to 50%. The left ventricle has mildly decreased function. The left ventricle has no regional wall motion abnormalities. The left ventricular internal cavity size was normal in size. There is moderate concentric left ventricular hypertrophy. Left ventricular diastolic function could not be evaluated due to atrial fibrillation. Left ventricular diastolic function could not be evaluated. Right Ventricle: The right ventricular size is normal. No increase in right ventricular wall thickness. Right ventricular systolic function is mildly reduced. There is moderately elevated pulmonary artery systolic pressure. The tricuspid regurgitant velocity is 2.96 m/s, and with an assumed right atrial pressure of 15 mmHg, the estimated right ventricular systolic pressure is 50.0 mmHg. Left  Atrium: Left atrial size was severely dilated. Right Atrium: Right atrial size was severely dilated. Pericardium: Trivial pericardial effusion is present. Mitral Valve: The mitral valve is normal in structure. Mild mitral valve regurgitation. No evidence of mitral valve stenosis. Tricuspid Valve: The tricuspid valve is normal in structure. Tricuspid valve regurgitation is mild to moderate. No evidence of tricuspid stenosis. Aortic Valve: The aortic valve is tricuspid. Aortic valve regurgitation is not visualized. No aortic stenosis is present. Pulmonic Valve: The pulmonic valve was normal in structure. Pulmonic valve regurgitation is trivial. No evidence of pulmonic stenosis. Aorta: Aortic dilatation noted. There is mild dilatation of the aortic root, measuring 42 mm. Venous: The inferior vena cava is dilated in size with  less than 50% respiratory variability, suggesting right atrial pressure of 15 mmHg. IAS/Shunts: No atrial level shunt detected by color flow Doppler.  LEFT VENTRICLE PLAX 2D LVIDd:         4.50 cm LVIDs:         3.40 cm LV PW:         1.50 cm LV IVS:        1.50 cm LVOT diam:     2.50 cm LV SV:         70 LV SV Index:   31 LVOT Area:     4.91 cm  LV Volumes (MOD) LV vol d, MOD A2C: 142.0 ml LV vol d, MOD A4C: 151.0 ml LV vol s, MOD A2C: 74.9 ml LV vol s, MOD A4C: 81.7 ml LV SV MOD A2C:     67.1 ml LV SV MOD A4C:     151.0 ml LV SV MOD BP:      68.1 ml RIGHT VENTRICLE            IVC RV Basal diam:  4.00 cm    IVC diam: 2.50 cm RV S prime:     8.07 cm/s TAPSE (M-mode): 1.3 cm LEFT ATRIUM              Index        RIGHT ATRIUM           Index LA diam:        4.90 cm  2.18 cm/m   RA Area:     34.10 cm LA Vol (A2C):   97.8 ml  43.42 ml/m  RA Volume:   130.00 ml 57.71 ml/m LA Vol (A4C):   109.0 ml 48.39 ml/m LA Biplane Vol: 108.0 ml 47.95 ml/m  AORTIC VALVE LVOT Vmax:   78.20 cm/s LVOT Vmean:  55.600 cm/s LVOT VTI:    0.142 m  AORTA Ao Root diam: 4.20 cm Ao Asc diam:  3.90 cm MR Peak grad:     109.8 mmHg   TRICUSPID VALVE MR Mean grad:    73.5 mmHg    TR Peak grad:   35.0 mmHg MR Vmax:         524.00 cm/s  TR Vmax:        296.00 cm/s MR Vmean:        399.5 cm/s MR PISA:         0.57 cm     SHUNTS MR PISA Eff ROA: 4 mm        Systemic VTI:  0.14 m MR PISA Radius:  0.30 cm      Systemic Diam: 2.50 cm Annabella Scarce MD Electronically signed by Annabella Scarce MD Signature Date/Time: 11/08/2024/11:58:00 AM    Final     EKG: EKG did not cross over   Radiology: CT Angio Chest PE W and/or Wo Contrast Result Date: 12/02/2024 EXAM: CTA CHEST 12/02/2024 12:12:00 AM TECHNIQUE: CTA of the chest was performed after the administration of intravenous contrast. Multiplanar reformatted images are provided for review. MIP images are provided for review. Automated exposure control, iterative reconstruction, and/or weight based adjustment of the mA/kV was utilized to reduce the radiation dose to as low as reasonably achievable. COMPARISON: None available. CLINICAL HISTORY: Syncope/presyncope, cerebrovascular cause suspected. FINDINGS: PULMONARY ARTERIES: Pulmonary arteries are adequately opacified for evaluation. Central pulmonary arteries are of normal caliber. No pulmonary embolism. MEDIASTINUM: Extensive multivessel coronary artery calcification. Mild global cardiomegaly. Trace pericardial effusion. Mild atherosclerotic calcification within the thoracic aorta. The  esophagus is unremarkable. Small hiatal hernia. Visualized thyroid  is unremarkable. LYMPH NODES: No mediastinal, hilar or axillary lymphadenopathy. LUNGS AND PLEURA: Moderate right and small left pleural effusions are present with associated bibasilar atelectasis. Right perihilar ground-glass pulmonary infiltrate in mild thickening of the peribronchovascular interstitium, best appreciated within the lung bases can be seen in the setting of Christopher [name] typical infection and airway inflammation for asymmetric perihilar pulmonary edema, which is favored given  the constellation of findings suggesting early cardiogenic failure. No pneumothorax. UPPER ABDOMEN: Limited images of the upper abdomen are unremarkable. SOFT TISSUES AND BONES: Osseous structures are age appropriate. No acute bone abnormality. No lytic or blastic bone lesion. No acute soft tissue abnormality. IMPRESSION: 1. No pulmonary embolism. 2. Right perihilar ground-glass pulmonary infiltrate and mild peribronchovascular interstitial thickening, favoring asymmetric perihilar pulmonary edema in the setting of early cardiogenic failure. 3. Moderate right and small left pleural effusions with associated bibasilar atelectasis. 4. Mild global cardiomegaly and trace pericardial effusion. 5. Extensive multivessel coronary artery calcification. 6. Small hiatal hernia. Electronically signed by: Dorethia Molt MD 12/02/2024 12:21 AM EST RP Workstation: [id number]   DG Chest 2 View Result Date: 12/01/2024 EXAM: 2 VIEW(S) XRAY OF THE CHEST 12/01/2024 11:21:35 PM COMPARISON: 04/20/2023 CLINICAL HISTORY: shortness of breath since friday FINDINGS: LUNGS AND PLEURA: Mild vascular congestion is noted without significant interstitial edema. Mild bibasilar atelectasis is noted. Small right-sided pleural effusion. No pneumothorax. HEART AND MEDIASTINUM: Cardiomegaly. Calcified aorta. BONES AND SOFT TISSUES: No acute osseous abnormality. IMPRESSION: 1. Mild vascular congestion without significant interstitial edema. 2. Mild bibasilar atelectasis with Christopher [name] small right-sided pleural effusion. Electronically signed by: Oneil Devonshire MD 12/01/2024 11:30 PM EST RP Workstation: [id number]   US  Abdomen Limited RUQ (LIVER/GB) Result Date: 12/01/2024 EXAM: Right Upper Quadrant Abdominal Ultrasound 12/01/2024 08:56:48 AM TECHNIQUE: Real-time ultrasonography of the right upper quadrant of the abdomen was performed. COMPARISON: None available. CLINICAL HISTORY: Elevated liver enzymes. FINDINGS: LIVER: Heterogeneous, starry sky appearance  of the hepatic parenchyma. No intrahepatic biliary ductal dilatation. No evidence of mass. BILIARY SYSTEM: Gallbladder wall thickening and pericholecystic fluid. Negative sonographic Murphy sign. The gallbladder wall measures up to 8 mm. No cholelithiasis. Common bile duct is within normal limits measuring 4.6 mm. OTHER: No right upper quadrant ascites. Hepatopetal flow in the portal vein. IMPRESSION: 1. Gallbladder wall thickening with pericholecystic fluid, without sonographic Murphy sign. Findings suggestive of acalculous acute cholecystitis. PRA call report. 2. Heterogeneous starry sky appearance of the hepatic parenchyma. Findings can be seen in the setting of hepatitis. Electronically signed by: Morgane Naveau MD 12/01/2024 09:04 PM EST RP Workstation: [id number]     Procedures   Medications Ordered in the ED  carvedilol  (COREG ) tablet 6.25 mg (has no administration in time range)  lisinopril  (ZESTRIL ) tablet 2.5 mg (has no administration in time range)  insulin  aspart (novoLOG ) injection 5 Units (has no administration in time range)  apixaban  (ELIQUIS ) tablet 5 mg (has no administration in time range)  insulin  pump (has no administration in time range)  furosemide  (LASIX ) injection 60 mg (has no administration in time range)  acetaminophen  (TYLENOL ) tablet 650 mg (has no administration in time range)    Or  acetaminophen  (TYLENOL ) suppository 650 mg (has no administration in time range)  ondansetron  (ZOFRAN ) tablet 4 mg (has no administration in time range)    Or  ondansetron  (ZOFRAN ) injection 4 mg (has no administration in time range)  albuterol  (PROVENTIL ) (2.5 MG/3ML) 0.083% nebulizer solution 2.5 mg (has no administration in time range)  iohexol  (  OMNIPAQUE ) 350 MG/ML injection 75 mL (75 mLs Intravenous Contrast Given 12/02/24 0013)  loratadine  (CLARITIN ) tablet 10 mg (10 mg Oral Given 12/02/24 0233)                                    Medical Decision Making Patient with SOB  sent in for positive US   Amount and/or Complexity of Data Reviewed Independent Historian: spouse    Details: See above  External Data Reviewed: notes.    Details: Previous notes  Labs: ordered.    Details: Normal sodium 144, normal potassium 4.3, elevated creatinine 1.7, elevated troponins 60, 66.  Elevated BNP 3538, negative covid and flu.  Normal white count 6.7,  Radiology: ordered and independent interpretation performed.    Details: Pulmonary edema on CTA ECG/medicine tests: ordered and independent interpretation performed. Discussion of management or test interpretation with external provider(s): Surgery consulted via chat for US  reading, Dr. Rubin to see patient has acknowledged chat   Risk OTC drugs. Prescription drug management. Decision regarding hospitalization. Risk Details: Will admit for CHF      Final diagnoses:  Pericardial effusion  Pleural effusion  Acute pulmonary edema (HCC)   The patient appears reasonably stabilized for admission considering the current resources, flow, and capabilities available in the ED at this time, and I doubt any other [facility] requiring further screening and/or treatment in the ED prior to admission.  ED Discharge Orders     None          Sadako Cegielski, MD 12/02/24 [id number]

## 2024-12-02 NOTE — Progress Notes (Signed)
 RNCM went to pt bedside to deliver Code 44, pt sedated and restrained.  Will deliver when spouse comes to pick up.  Avanish Cerullo J. Debarah, BSN, RN, [facility]  Inpatient Care Management  Nurse Case Manager  [facility] Emergency Departments  Operative Services  [phone]

## 2024-12-02 NOTE — Progress Notes (Signed)
° °  Brief Progress Note   _____________________________________________________________________________________________________________  Patient Name: Christopher [name] Patient DOB: [birth year] Date: @TODAY @      Data: Reviewed vital signs, labs, and clinical notes.    Action: No action required at this time.    Response:  Pt downgraded to tele.  _____________________________________________________________________________________________________________  [facility] RN Expeditor Dura Mccormack S Deeann Servidio Please contact us  directly via secure chat (search for [facility]) or by calling us  at [phone] [facility]).

## 2024-12-02 NOTE — Care Management CC44 (Signed)
 Condition Code 44 Documentation Completed  Patient Details  Name: Christopher [name] MRN: [id number] Date of Birth: [birth year]   Condition Code 44 given:  Yes Patient signature on Condition Code 44 notice:  Yes Documentation of 2 MD's agreement:  Yes Code 44 added to claim:  Yes    Debarah Saunas, RN 12/02/2024, 2:47 PM

## 2024-12-02 NOTE — Progress Notes (Signed)
 Heart Failure Navigator Progress Note  Assessed for Heart & Vascular TOC clinic readiness.  Patient does not meet criteria due to per MD note patient with a history of Dementia, No HF TOC. .   Navigator will sign off at this time.   Stephane Haddock, BSN, Scientist, clinical (histocompatibility and immunogenetics) Only

## 2024-12-02 NOTE — ED Notes (Signed)
 Patient discharged to home with x3 e-scripts,  breathing is even and unlabored. Skin warm,dry, and natural in color. PT wife educated on follow up.PT to follow up as directed. Denies any further questions at this time.

## 2024-12-02 NOTE — Progress Notes (Signed)
 Dr. Perri reached out to arrange closer f/u. [address] has no availability. I am not in clinic the next 2 weeks nor is Dr. Michele. Found appt at [facility] location 12/22, have scheduled with APP Reche Finder. Dr. Perri will make patient's family aware of location specifically being at [facility]. We will leave the other f/u's as planned for now - [facility] clinic 12/26 and [facility] 12/31 - contingent on eval at that time. Updated AVS with appt info.

## 2024-12-02 NOTE — ED Notes (Signed)
 Pt to MRI

## 2024-12-02 NOTE — ED Notes (Signed)
 Patient transported to CT

## 2024-12-02 NOTE — Discharge Summary (Addendum)
 Physician Discharge Summary  Christopher [name] FMW:[id number] DOB: 16-Jul-1946 DOA: 12/01/2024  PCP: Merna Huxley, NP  Admit date: 12/01/2024 Discharge date: 12/02/2024  Time spent: 40 minutes  Recommendations for Outpatient Follow-up:  Follow outpatient CBC/CMP  Has follow up appt with cards 12/22 at 2:45 Follow volume status outpatient with PCP/Cardiology - discharged on lasix  40 mg daily (attention to lytes, renal function, volume status) Abnormal RUQ US  suspect due to his volume overload, consider follow up if he's symptomatic (HIDA and MRCP canceled due to his inability to participate with exam without sedation and lack of abdominal symptoms) Follow HR on 12.5 mg coreg  BID Dementia limited our ability to treat here in hospital - we discharged him rather than keep him here in restraints + antipsychotics after conversation with his wife.  Had brief discussion with wife regarding goals of care, likely appropriate to have these conversations outpatient.   Consider follow up chest imaging outpatient for his bilateral effusions, pulmonary edema.  Consider abdominal imaging given his late onset diabetes, but would consider based on his goc.  Discharge Diagnoses:  Principal Problem:   CHF (congestive heart failure) (HCC) Active Problems:   Heart failure (HCC)   Acute pulmonary edema (HCC)   Discharge Condition: stable  Diet recommendation: heart healthy, diabetic  Filed Weights   12/01/24 2233  Weight: 93 kg    History of present illness:   Christopher [name] is Christopher [name] 78 y.o. male  with medical history significant of atrial fibrillation, dementia, T1DM, CKD IIIb and other medical issues here with several weeks of SOB directed to the ED with abnormal RUQ US  findings.     He's being treated for Christopher [name] HF exacerbation.  Gen surgery c/s for abnormal gallbladder findings.  Hospitalization was complicated by delirium/agitation requiring security to be called, restraints, and Christopher [name] dose of haldol .  After  Christopher [name] conversation, we've decided to discharge Christopher [name] home due to the risks of hospitalization/need for sedation/restraints and his relatively stable clinical status (satting well on RA, clinically with less SOB per discussion with his wife).    Discussed outpatient follow up with cardiology.     Hospital Course:  Assessment and Plan:  Dementia - at baseline, confused, calls wife his sister at times.  Able to dress, bathe, walks without assistive device.  Worse hallucinations/delirium in past few weeks it seems. - delirium precautions - b12 wnl, TSH wnl - follows with neurology outpatient - he'd benefit from Patsye Sullivant short hospital stay as he's at high risk for delirium - discussed briefly likely need for goals of care discussions in the near future - with his inability to meaningfully participate with care - issues with agitation per discussion with nursing.  He was getting up, locked himself in bathroom.  Wasn't listening to his wife.  Required haldol  and restraints.  His wife mentioned the possibility of discharge.  Vitals notable for mild tachycardia, he's not hypoxic on RA, blood pressures ok.  His wife notes his SOB has significantly improved.  Discussed would prefer to diurese in the hospital setting, but with his dementia, I do worry about the risk of hospitalization and risk of restraints/sedation which he would require for safety.  Will defer the HIDA scan and MRCP given lack of abdominal symptoms, suspect findings related to HF.  Given his improved SOB per discussion with his wife and the risks of hospital delirium in this gentleman with dementia (already requiring Azad Calame call to security, haldol , + restraints), I think discharge home is reasonable (discussed  return precautions).    Acute HFmrEF Bilateral Pleural Effusions Pulmonary Edema Shortness of breath Demand Ischemia in setting of Volume Overload - CT with R perihilar ground glass pulm infiltrate and mild peribronchovascular interstitial  thickening - asymmetric perihilar pulm edema.  Moderate R and small L effusions.   - elevated BNP, elvated and flat troponins - EKG without concerning ST-T wave changes - mild LE edema  - normal TSH 10/2024 - recent echo 10/2024 with midrange EF 45-50%, mildly reduced RVSF, moderately elevated PASP, IVC dilated with <50% resp variability - will defer repeat echo at this time - remains on RA - SOB improved per his wife.  While not ideal given his overload, with his inability to reliably participate with hospitalization, will discharge home with lasix  + klor con.  Messaged cardiology regarding an outpatient appointment Monday, appreciative of their assistance with this close follow up.     Elevated LFTs - suspect related to R sided HF above - may explain US  findings - acute hepatitis panel (pending) - trend with diuresis outpatient   Gallbladder wall thickening with pericholecystic fluid - appreciate general surgery eval - he'd require sedation for HIDA and MRCP - will defer imaging given his lack of symptoms - suspect related to volume overload   Type 1 Diabetes - resume home regimen on discharge, his wife has discussed plan with RD from endocrine - could consider abdominal imaging outpatient   CKD IIIb - at around baseline 1.5- 1.7 - follow with diuresis    Uncontrolled HTN  - lisinopril , coreg    Atrial fibrillation  -mild RVR to 110's -continue on Eliquis  -coreg  increased to 12.5 mg BID    GERD -ppi    Glaucoma  -resume eye drops     HLD -diet controlled       Procedures:  none  Consultations: General surgery Communicated with his PCP regarding d/c plan and rationale Communicated with cardiology regarding follow up appt  Discharge Exam: Vitals:   12/02/24 1430 12/02/24 1432  BP: (!) 152/113   Pulse: 85 92  Resp: 10 19  Temp:    SpO2: 100% 100%   See progress note from earlier today Discussed again with wife at the end of the day - she's comfortable  bringing him home - thinks she'll be able to care for him.  Discussed return precautions.  Sending with coreg  + lasix  + klor con.  Needs follow up within Christopher [name] few days.  Discussed need to possibly discuss goals of care.    Seen in follow up, he was being fed by nurse tech while in restraints.  No complaints.  Appeared comfortable.  HR in 110's.    Discharge Instructions   Discharge Instructions     Call MD for:  difficulty breathing, headache or visual disturbances   Complete by: As directed    Call MD for:  extreme fatigue   Complete by: As directed    Call MD for:  hives   Complete by: As directed    Call MD for:  persistant dizziness or light-headedness   Complete by: As directed    Call MD for:  persistant nausea and vomiting   Complete by: As directed    Call MD for:  redness, tenderness, or signs of infection (pain, swelling, redness, odor or green/yellow discharge around incision site)   Complete by: As directed    Call MD for:  severe uncontrolled pain   Complete by: As directed    Call MD for:  temperature >  100.4   Complete by: As directed    Discharge instructions   Complete by: As directed    You were seen for new onset heart failure.  We've started you on 40 mg lasix  daily.  You should weigh yourself everyday.  If your weight increases by 2-3 lbs in Joas Motton day or 5 lbs in Adiah Guereca week, call your PCP or cardiology for instructions on your lasix  dose.  We've increased your coreg  to 12.5 mg twice Aniyla Harling day.   Your exam is not consistent with cholecystitis.  I think the risks of sedation and restraint required to get Christopher [name] HIDA scan or MRCP outweigh the benefits.  We'll let you go home.  You should follow up with your outpatient doctors.  They may consider additional imaging if needed.  Return for abdominal symptoms including abdominal pain.   Your ultrasound is abnormal.  I think this might reflect your heart failure or fluid overload.  Follow repeat labs as an outpatient with your PCP.      Your outpatient doctor can follow up your abnormal liver enzymes and abnormal ultrasound.     Return for new, recurrent, or worsening symptoms.  Please ask your PCP to request records from this hospitalization so they know what was done and what the next steps will be.   Increase activity slowly   Complete by: As directed       Allergies as of 12/02/2024   No Known Allergies      Medication List     TAKE these medications    apixaban  5 MG Tabs tablet Commonly known as: ELIQUIS  Take 1 tablet (5 mg total) by mouth 2 (two) times daily.   carvedilol  6.25 MG tablet Commonly known as: COREG  Take 2 tablets (12.5 mg total) by mouth 2 (two) times daily. What changed: how much to take   Dexcom G6 Sensor Misc Change every 10 days   Dexcom G6 Transmitter Misc Use every 3 months   furosemide  40 MG tablet Commonly known as: Lasix  Take 1 tablet (40 mg total) by mouth daily. Weigh yourself daily (keep Dejia Ebron log).  Follow up with cardiology or your PCP within 1 week for repeat labs and exam.   Insulin  Pen Needle 32G X 4 MM Misc Use 4x Danilyn Cocke day with insulin  pens to administer insulin    Lantus  SoloStar 100 UNIT/ML Solostar Pen Generic drug: insulin  glargine Inject 20 Units into the skin daily. To use when insulin  pump is not working, as Kelvin Burpee backup.   latanoprost 0.005 % ophthalmic solution Commonly known as: XALATAN Place 1 drop into both eyes at bedtime.   lisinopril  2.5 MG tablet Commonly known as: ZESTRIL  Take 1 tablet by mouth once daily   magnesium  oxide 400 MG tablet Commonly known as: MAG-OX Take 1 tablet (400 mg total) by mouth daily.   Multivitamin Adults 50+ Tabs Take 1 tablet by mouth daily.   NovoLOG  100 UNIT/ML injection Generic drug: insulin  aspart INJECT UP TO 60 UNITS SUBCUTANEOUSLY ONCE DAILY IN  INSULIN   PUMP   NovoLOG  FlexPen 100 UNIT/ML FlexPen Generic drug: insulin  aspart Inject 5 Units into the skin 3 (three) times daily with meals. To use as Christopher [name] backup  when insulin  pump is not working.   Omnipod 5 DexG7G6 Pods Gen 5 Misc CHANGE POD EVERY 3 DAYS   potassium chloride  SA 20 MEQ tablet Commonly known as: KLOR-CON  M Take 1 tablet (20 mEq total) by mouth daily. Take with lasix .  Repeat labs within 1 week with your  PCP or cardiology       Allergies[1]  Follow-up Information     Nafziger, Darleene, NP Follow up.   Specialty: Family Medicine Contact information: [address] [place] [address] [phone]                  The results of significant diagnostics from this hospitalization (including imaging, microbiology, ancillary and laboratory) are listed below for reference.    Significant Diagnostic Studies: CT Angio Chest PE W and/or Wo Contrast Result Date: 12/02/2024 EXAM: CTA CHEST 12/02/2024 12:12:00 AM TECHNIQUE: CTA of the chest was performed after the administration of intravenous contrast. Multiplanar reformatted images are provided for review. MIP images are provided for review. Automated exposure control, iterative reconstruction, and/or weight based adjustment of the mA/kV was utilized to reduce the radiation dose to as low as reasonably achievable. COMPARISON: None available. CLINICAL HISTORY: Syncope/presyncope, cerebrovascular cause suspected. FINDINGS: PULMONARY ARTERIES: Pulmonary arteries are adequately opacified for evaluation. Central pulmonary arteries are of normal caliber. No pulmonary embolism. MEDIASTINUM: Extensive multivessel coronary artery calcification. Mild global cardiomegaly. Trace pericardial effusion. Mild atherosclerotic calcification within the thoracic aorta. The esophagus is unremarkable. Small hiatal hernia. Visualized thyroid  is unremarkable. LYMPH NODES: No mediastinal, hilar or axillary lymphadenopathy. LUNGS AND PLEURA: Moderate right and small left pleural effusions are present with associated bibasilar atelectasis. Right perihilar ground-glass pulmonary infiltrate in mild  thickening of the peribronchovascular interstitium, best appreciated within the lung bases can be seen in the setting of Mayford Alberg typical infection and airway inflammation for asymmetric perihilar pulmonary edema, which is favored given the constellation of findings suggesting early cardiogenic failure. No pneumothorax. UPPER ABDOMEN: Limited images of the upper abdomen are unremarkable. SOFT TISSUES AND BONES: Osseous structures are age appropriate. No acute bone abnormality. No lytic or blastic bone lesion. No acute soft tissue abnormality. IMPRESSION: 1. No pulmonary embolism. 2. Right perihilar ground-glass pulmonary infiltrate and mild peribronchovascular interstitial thickening, favoring asymmetric perihilar pulmonary edema in the setting of early cardiogenic failure. 3. Moderate right and small left pleural effusions with associated bibasilar atelectasis. 4. Mild global cardiomegaly and trace pericardial effusion. 5. Extensive multivessel coronary artery calcification. 6. Small hiatal hernia. Electronically signed by: Dorethia Molt MD 12/02/2024 12:21 AM EST RP Workstation: [id number]   DG Chest 2 View Result Date: 12/01/2024 EXAM: 2 VIEW(S) XRAY OF THE CHEST 12/01/2024 11:21:35 PM COMPARISON: 04/20/2023 CLINICAL HISTORY: shortness of breath since friday FINDINGS: LUNGS AND PLEURA: Mild vascular congestion is noted without significant interstitial edema. Mild bibasilar atelectasis is noted. Small right-sided pleural effusion. No pneumothorax. HEART AND MEDIASTINUM: Cardiomegaly. Calcified aorta. BONES AND SOFT TISSUES: No acute osseous abnormality. IMPRESSION: 1. Mild vascular congestion without significant interstitial edema. 2. Mild bibasilar atelectasis with Christopher [name] small right-sided pleural effusion. Electronically signed by: Oneil Devonshire MD 12/01/2024 11:30 PM EST RP Workstation: [id number]   US  Abdomen Limited RUQ (LIVER/GB) Result Date: 12/01/2024 EXAM: Right Upper Quadrant Abdominal Ultrasound 12/01/2024  08:56:48 AM TECHNIQUE: Real-time ultrasonography of the right upper quadrant of the abdomen was performed. COMPARISON: None available. CLINICAL HISTORY: Elevated liver enzymes. FINDINGS: LIVER: Heterogeneous, starry sky appearance of the hepatic parenchyma. No intrahepatic biliary ductal dilatation. No evidence of mass. BILIARY SYSTEM: Gallbladder wall thickening and pericholecystic fluid. Negative sonographic Murphy sign. The gallbladder wall measures up to 8 mm. No cholelithiasis. Common bile duct is within normal limits measuring 4.6 mm. OTHER: No right upper quadrant ascites. Hepatopetal flow in the portal vein. IMPRESSION: 1. Gallbladder wall thickening with pericholecystic fluid, without sonographic Murphy sign.  Findings suggestive of acalculous acute cholecystitis. PRA call report. 2. Heterogeneous starry sky appearance of the hepatic parenchyma. Findings can be seen in the setting of hepatitis. Electronically signed by: Morgane Naveau MD 12/01/2024 09:04 PM EST RP Workstation: [id number]   LONG TERM MONITOR (3-14 DAYS) Result Date: 11/25/2024 Cardiac monitor (Zio Patch): 11/05/2024 - 11/18/2024 Dominant rhythm atrial fibrillation, 100% burden. Heart rate 49-199 bpm.  Avg HR 85 bpm. No high grade AV block, pauses (3 seconds or longer). Total supraventricular ectopic burden 0% Total ventricular ectopic burden 28.4% (isolated 26.6%, couplets 1.8%). 3 auto triggered events noted NSVT. Longest episode of NSVT: 11/17/2024 at 4:22 PM, 12 beats, 4.4 seconds, average HR 169 bpm. Patient triggered events: 0.   ECHOCARDIOGRAM COMPLETE Result Date: 11/08/2024    ECHOCARDIOGRAM REPORT   Patient Name:   THERRON SELLS Date of Exam: 11/08/2024 Medical Rec #:  [id number]      Height:       76.0 in Accession #:    [id number]     Weight:       208.0 lb Date of Birth:  May 25, 1946      BSA:          2.252 m Patient Age:    78 years       BP:           170/96 mmHg Patient Gender: M              HR:           76 bpm.  Exam Location:  [address] Procedure: 2D Echo, Cardiac Doppler and Color Doppler (Both Spectral and Color            Flow Doppler were utilized during procedure). 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 TIA; Risk Factors:Hypertension and                 Dyslipidemia.  Sonographer:    Powell Saras Referring Phys: [id number] DAYNA N DUNN IMPRESSIONS  1. Left ventricular ejection fraction, by estimation, is 45 to 50%. The left ventricle has mildly decreased function. The left ventricle has no regional wall motion abnormalities. There is moderate concentric left ventricular hypertrophy. Left ventricular diastolic function could not be evaluated.  2. Right ventricular systolic function is mildly reduced. The right ventricular size is normal. There is moderately elevated pulmonary artery systolic pressure.  3. Left atrial size was severely dilated.  4. Right atrial size was severely dilated.  5. The mitral valve is normal in structure. Mild mitral valve regurgitation. No evidence of mitral stenosis.  6. Tricuspid valve regurgitation is mild to moderate.  7. The aortic valve is tricuspid. Aortic valve regurgitation is not visualized. No aortic stenosis is present.  8. Aortic dilatation noted. There is mild dilatation of the aortic root, measuring 42 mm.  9. The inferior vena cava is dilated in size with <50% respiratory variability, suggesting right atrial pressure of 15 mmHg. FINDINGS  Left Ventricle: Left ventricular ejection fraction, by estimation, is 45 to 50%. The left ventricle has mildly decreased function. The left ventricle has no regional wall motion abnormalities. The left ventricular internal cavity size was normal in size. There is moderate  concentric left ventricular hypertrophy. Left ventricular diastolic function could not be evaluated due to atrial  fibrillation. Left ventricular diastolic function could not be evaluated. Right Ventricle: The right ventricular size is normal. No increase in right ventricular wall thickness. Right ventricular systolic function is mildly reduced. There is moderately elevated pulmonary artery systolic pressure. The tricuspid regurgitant velocity is 2.96 m/s, and with an assumed right atrial pressure of 15 mmHg, the estimated right ventricular systolic pressure is 50.0 mmHg. Left Atrium: Left atrial size was severely dilated. Right Atrium: Right atrial size was severely dilated. Pericardium: Trivial pericardial effusion is present. Mitral Valve: The mitral valve is normal in structure. Mild mitral valve regurgitation. No evidence of mitral valve stenosis. Tricuspid Valve: The tricuspid valve is normal in structure. Tricuspid valve regurgitation is mild to moderate. No evidence of tricuspid stenosis. Aortic Valve: The aortic valve is tricuspid. Aortic valve regurgitation is not visualized. No aortic stenosis is present. Pulmonic Valve: The pulmonic valve was normal in structure. Pulmonic valve regurgitation is trivial. No evidence of pulmonic stenosis. Aorta: Aortic dilatation noted. There is mild dilatation of the aortic root, measuring 42 mm. Venous: The inferior vena cava is dilated in size with less than 50% respiratory variability, suggesting right atrial pressure of 15 mmHg. IAS/Shunts: No atrial level shunt detected by color flow Doppler.  LEFT VENTRICLE PLAX 2D LVIDd:         4.50 cm LVIDs:         3.40 cm LV PW:         1.50 cm LV IVS:        1.50 cm LVOT diam:     2.50 cm LV SV:         70 LV SV Index:   31 LVOT Area:     4.91 cm  LV Volumes (MOD) LV vol d, MOD A2C: 142.0 ml LV vol d, MOD A4C: 151.0 ml LV vol s, MOD A2C: 74.9 ml LV vol s, MOD A4C: 81.7 ml LV SV MOD A2C:     67.1 ml LV SV MOD A4C:     151.0 ml LV SV MOD BP:      68.1 ml RIGHT VENTRICLE            IVC RV Basal diam:  4.00 cm    IVC diam: 2.50 cm RV S prime:      8.07 cm/s TAPSE (M-mode): 1.3 cm LEFT ATRIUM              Index        RIGHT ATRIUM           Index LA diam:        4.90 cm  2.18 cm/m   RA Area:     34.10 cm LA Vol (A2C):   97.8 ml  43.42 ml/m  RA Volume:   130.00 ml 57.71 ml/m LA Vol (A4C):   109.0 ml 48.39 ml/m LA Biplane Vol: 108.0 ml 47.95 ml/m  AORTIC VALVE LVOT Vmax:   78.20 cm/s LVOT Vmean:  55.600 cm/s LVOT VTI:    0.142 m  AORTA Ao Root diam: 4.20 cm Ao Asc diam:  3.90 cm MR Peak grad:    109.8 mmHg   TRICUSPID VALVE MR Mean grad:    73.5 mmHg    TR Peak grad:   35.0 mmHg MR Vmax:         524.00 cm/s  TR Vmax:        296.00 cm/s MR Vmean:  399.5 cm/s MR PISA:         0.57 cm     SHUNTS MR PISA Eff ROA: 4 mm        Systemic VTI:  0.14 m MR PISA Radius:  0.30 cm      Systemic Diam: 2.50 cm Christopher Scarce MD Electronically signed by Christopher Scarce MD Signature Date/Time: 11/08/2024/11:58:00 AM    Final     Microbiology: Recent Results (from the past 240 hours)  Resp panel by RT-PCR (RSV, Flu Ginna Schuur&B, Covid) Anterior Nasal Swab     Status: None   Collection Time: 12/01/24 11:12 PM   Specimen: Anterior Nasal Swab  Result Value Ref Range Status   SARS Coronavirus 2 by RT PCR NEGATIVE NEGATIVE Final   Influenza Prynce Jacober by PCR NEGATIVE NEGATIVE Final   Influenza B by PCR NEGATIVE NEGATIVE Final    Comment: (NOTE) The Xpert Xpress SARS-CoV-2/FLU/RSV plus assay is intended as an aid in the diagnosis of influenza from Nasopharyngeal swab specimens and should not be used as Christopher [name] sole basis for treatment. Nasal washings and aspirates are unacceptable for Xpert Xpress SARS-CoV-2/FLU/RSV testing.  Fact Sheet for Patients: bloggercourse.com  Fact Sheet for Healthcare Providers: seriousbroker.it  This test is not yet approved or cleared by the [place]  FDA and has been authorized for detection and/or diagnosis of SARS-CoV-2 by FDA under an Emergency Use Authorization (EUA). This EUA  will remain in effect (meaning this test can be used) for the duration of the COVID-19 declaration under Section 564(b)(1) of the Act, 21 U.S.C. section 360bbb-3(b)(1), unless the authorization is terminated or revoked.     Resp Syncytial Virus by PCR NEGATIVE NEGATIVE Final    Comment: (NOTE) Fact Sheet for Patients: bloggercourse.com  Fact Sheet for Healthcare Providers: seriousbroker.it  This test is not yet approved or cleared by the [place]  FDA and has been authorized for detection and/or diagnosis of SARS-CoV-2 by FDA under an Emergency Use Authorization (EUA). This EUA will remain in effect (meaning this test can be used) for the duration of the COVID-19 declaration under Section 564(b)(1) of the Act, 21 U.S.C. section 360bbb-3(b)(1), unless the authorization is terminated or revoked.  Performed at [facility], [address], [place], [place] [address]      Labs: [name] Metabolic Panel: Recent Labs  Lab 12/01/24 2244 12/02/24 0424  NA 144 142  K 4.3 4.1  CL 111 111  CO2 24 22  GLUCOSE 141* 104*  BUN 25* 24*  CREATININE 1.70* 1.57*  CALCIUM  8.9 8.6*   Liver Function Tests: Recent Labs  Lab 12/01/24 2244 12/02/24 0424  AST 59* 57*  ALT 72* 71*  ALKPHOS 127* 106  BILITOT 0.7 0.8  PROT 5.6* 5.2*  ALBUMIN 3.6 3.4*   No results for input(s): LIPASE, AMYLASE in the last 168 hours. No results for input(s): AMMONIA in the last 168 hours. CBC: Recent Labs  Lab 12/01/24 2244 12/02/24 0424  WBC 6.7 5.4  HGB 13.7 13.4  HCT 39.3 37.9*  MCV 74.7* 74.6*  PLT 213 191   Cardiac Enzymes: No results for input(s): CKTOTAL, CKMB, CKMBINDEX, TROPONINI in the last 168 hours. BNP: BNP (last 3 results) No results for input(s): BNP in the last 8760 hours.  ProBNP (last 3 results) Recent Labs    12/02/24 0035  PROBNP 3,538.0*    CBG: Recent Labs  Lab 12/02/24 1211  GLUCAP 205*        Signed:  Meliton Monte MD.  [facility]  12/02/2024, 2:40 PM       [1] No Known Allergies

## 2024-12-02 NOTE — Progress Notes (Signed)
 Christopher [name] was very pleasant but he was unable to cooperate with exam to get diagnostic imaging. He kept falling asleep and not participating in breath holds. Normally we can work around this somewhat with sequences designed for free breathing but it would appear Christopher [name] may suffer from sleep apnea as he would go long periods without breathing and our equipment could not calibrate to a non rhythmic breathing pattern. Pt sent back to ED.

## 2024-12-02 NOTE — ED Notes (Signed)
Non-violent restraints removed. 

## 2024-12-02 NOTE — ED Notes (Signed)
 Checked patient cbg it was 26 notified RN of blood sugar patient is resting with sitter at bedside

## 2024-12-02 NOTE — ED Notes (Signed)
 CBG 132 via pt's dexcom

## 2024-12-02 NOTE — ED Notes (Signed)
 Wife declined patients vitals upon discharge.

## 2024-12-02 NOTE — Progress Notes (Addendum)
 PROGRESS NOTE    Christopher [name]  FMW:[id number] DOB: 10-May-1946 DOA: 12/01/2024 PCP: Christopher Huxley, NP  Chief Complaint  Patient presents with   Shortness of Breath    Brief Narrative:   Christopher [name] is Christopher [name] 78 y.o. male  with medical history significant of atrial fibrillation, dementia, T1DM, CKD IIIb and other medical issues here with several weeks of SOB directed to the ED with abnormal RUQ US  findings.    He's being treated for Christopher [name] HF exacerbation.  Gen surgery c/s for abnormal gallbladder findings.      Assessment & Plan:   Principal Problem:   CHF (congestive heart failure) (HCC) Active Problems:   Heart failure (HCC)  Acute HFmrEF Bilateral Pleural Effusions Pulmonary Edema Shortness of breath Demand Ischemia in setting of Volume Overload - CT with R perihilar ground glass pulm infiltrate and mild peribronchovascular interstitial thickening - asymmetric perihilar pulm edema.  Moderate R and small L effusions.   - elevated BNP, elvated and flat troponins - EKG without concerning ST-T wave changes - mild LE edema  - normal TSH 10/2024 - recent echo 10/2024 with midrange EF 45-50%, mildly reduced RVSF, moderately elevated PASP, IVC dilated with <50% resp variability - will defer repeat echo at this time - lasix  40 mg IV BID - strict I/O, daily weights - will hold off on cards consult at this time - consider if issues with diuresis   Elevated LFTs - suspect related to R sided HF above - may explain US  findings - acute hepatitis panel  - trend with diuresis  Gallbladder wall thickening with pericholecystic fluid - appreciate general surgery eval - HIDA scan pending  - MRCP pending  Type 1 Diabetes - 12/4 endocrine note, uses 33 units insulin  daily via pump on average (basal 64%, bolus 36%) - will d/c insulin  pump  - start 10 units basal + SSI for now while he's NPO  - sounds like developed diabetes late in life, I think pancreatic imaging via planned MRCP  reasonable   Dementia - at baseline, confused, calls wife his sister at times.  Able to dress, bathe, walks without assistive device.  Worse hallucinations/delirium in past few weeks it seems. - delirium precautions - b12 wnl, TSH wnl - follows with neurology outpatient - he'd benefit from Christopher [name] short hospital stay as he's at high risk for delirium   10:27 AM Called by RN, pt agitated, locked himself in bathroom, trying to leave.  Orders for restraints, haldol  placed.  RN to get security to help.  His wife is present, but that hasn't helped yet.  CKD IIIb - at around baseline 1.5- 1.7 - follow with diuresis    Uncontrolled HTN  - lisinopril , coreg    Atrial fibrillation  -continue on Eliquis  -coreg    GERD -ppi    Glaucoma  -resume eye drops     HLD -diet controlled     DVT prophylaxis: eliquis  Code Status: full Family Communication: wife at bedside Disposition:   Status is: Inpatient Remains inpatient appropriate because: need for continued inpatinet care   Consultants:  General surgery  Procedures:  none  Antimicrobials:  Anti-infectives (From admission, onward)    None       Subjective: Notes SOB History limited due to his dementia Wife at bedside - notes several weeks of DOE, more confusion/hallucinations  Objective: Vitals:   12/02/24 0237 12/02/24 0238 12/02/24 0615 12/02/24 0657  BP:  (!) 139/123 (!) 141/122   Pulse: (!) 35 (!) 43 90  Resp: 20 13 16    Temp:    98.4 F (36.9 C)  TempSrc:    Oral  SpO2: 94% 100% 96%   Weight:      Height:       No intake or output data in the 24 hours ending 12/02/24 0818 Filed Weights   12/01/24 2233  Weight: 93 kg    Examination:  General exam: Appears calm and comfortable - able to walk to bathroom Respiratory system: unlabored on RA, no adventitious lung sounds appreciated Cardiovascular system: irregularly irregular, frequent PVC's on telemetry Gastrointestinal system: Abdomen is nondistended,  soft and nontender.  Central nervous system: Alert and disoriented. No focal neurological deficits. Extremities: bilateral LE edema   Data Reviewed: I have personally reviewed following labs and imaging studies  CBC: Recent Labs  Lab 12/01/24 2244 12/02/24 0424  WBC 6.7 5.4  HGB 13.7 13.4  HCT 39.3 37.9*  MCV 74.7* 74.6*  PLT 213 191    Basic Metabolic Panel: Recent Labs  Lab 12/01/24 2244 12/02/24 0424  NA 144 142  K 4.3 4.1  CL 111 111  CO2 24 22  GLUCOSE 141* 104*  BUN 25* 24*  CREATININE 1.70* 1.57*  CALCIUM  8.9 8.6*    GFR: Estimated Creatinine Clearance: 48.4 mL/min (Christopher [name]) (by C-G formula based on SCr of 1.57 mg/dL (H)).  Liver Function Tests: Recent Labs  Lab 12/01/24 2244 12/02/24 0424  AST 59* 57*  ALT 72* 71*  ALKPHOS 127* 106  BILITOT 0.7 0.8  PROT 5.6* 5.2*  ALBUMIN 3.6 3.4*    CBG: No results for input(s): GLUCAP in the last 168 hours.   Recent Results (from the past 240 hours)  Resp panel by RT-PCR (RSV, Flu Christopher [name]&B, Covid) Anterior Nasal Swab     Status: None   Collection Time: 12/01/24 11:12 PM   Specimen: Anterior Nasal Swab  Result Value Ref Range Status   SARS Coronavirus 2 by RT PCR NEGATIVE NEGATIVE Final   Influenza Christopher [name] by PCR NEGATIVE NEGATIVE Final   Influenza B by PCR NEGATIVE NEGATIVE Final    Comment: (NOTE) The Xpert Xpress SARS-CoV-2/FLU/RSV plus assay is intended as an aid in the diagnosis of influenza from Nasopharyngeal swab specimens and should not be used as Christopher [name] sole basis for treatment. Nasal washings and aspirates are unacceptable for Xpert Xpress SARS-CoV-2/FLU/RSV testing.  Fact Sheet for Patients: bloggercourse.com  Fact Sheet for Healthcare Providers: seriousbroker.it  This test is not yet approved or cleared by the [place]  FDA and has been authorized for detection and/or diagnosis of SARS-CoV-2 by FDA under an Emergency Use Authorization (EUA). This EUA  will remain in effect (meaning this test can be used) for the duration of the COVID-19 declaration under Section 564(b)(1) of the Act, 21 U.S.C. section 360bbb-3(b)(1), unless the authorization is terminated or revoked.     Resp Syncytial Virus by PCR NEGATIVE NEGATIVE Final    Comment: (NOTE) Fact Sheet for Patients: bloggercourse.com  Fact Sheet for Healthcare Providers: seriousbroker.it  This test is not yet approved or cleared by the [place]  FDA and has been authorized for detection and/or diagnosis of SARS-CoV-2 by FDA under an Emergency Use Authorization (EUA). This EUA will remain in effect (meaning this test can be used) for the duration of the COVID-19 declaration under Section 564(b)(1) of the Act, 21 U.S.C. section 360bbb-3(b)(1), unless the authorization is terminated or revoked.  Performed at [facility], [address], [place], [place] [address]  Radiology Studies: CT Angio Chest PE W and/or Wo Contrast Result Date: 12/02/2024 EXAM: CTA CHEST 12/02/2024 12:12:00 AM TECHNIQUE: CTA of the chest was performed after the administration of intravenous contrast. Multiplanar reformatted images are provided for review. MIP images are provided for review. Automated exposure control, iterative reconstruction, and/or weight based adjustment of the mA/kV was utilized to reduce the radiation dose to as low as reasonably achievable. COMPARISON: None available. CLINICAL HISTORY: Syncope/presyncope, cerebrovascular cause suspected. FINDINGS: PULMONARY ARTERIES: Pulmonary arteries are adequately opacified for evaluation. Central pulmonary arteries are of normal caliber. No pulmonary embolism. MEDIASTINUM: Extensive multivessel coronary artery calcification. Mild global cardiomegaly. Trace pericardial effusion. Mild atherosclerotic calcification within the thoracic aorta. The esophagus is unremarkable. Small hiatal  hernia. Visualized thyroid  is unremarkable. LYMPH NODES: No mediastinal, hilar or axillary lymphadenopathy. LUNGS AND PLEURA: Moderate right and small left pleural effusions are present with associated bibasilar atelectasis. Right perihilar ground-glass pulmonary infiltrate in mild thickening of the peribronchovascular interstitium, best appreciated within the lung bases can be seen in the setting of Christopher [name] typical infection and airway inflammation for asymmetric perihilar pulmonary edema, which is favored given the constellation of findings suggesting early cardiogenic failure. No pneumothorax. UPPER ABDOMEN: Limited images of the upper abdomen are unremarkable. SOFT TISSUES AND BONES: Osseous structures are age appropriate. No acute bone abnormality. No lytic or blastic bone lesion. No acute soft tissue abnormality. IMPRESSION: 1. No pulmonary embolism. 2. Right perihilar ground-glass pulmonary infiltrate and mild peribronchovascular interstitial thickening, favoring asymmetric perihilar pulmonary edema in the setting of early cardiogenic failure. 3. Moderate right and small left pleural effusions with associated bibasilar atelectasis. 4. Mild global cardiomegaly and trace pericardial effusion. 5. Extensive multivessel coronary artery calcification. 6. Small hiatal hernia. Electronically signed by: Christopher Molt MD 12/02/2024 12:21 AM EST RP Workstation: [id number]   DG Chest 2 View Result Date: 12/01/2024 EXAM: 2 VIEW(S) XRAY OF THE CHEST 12/01/2024 11:21:35 PM COMPARISON: 04/20/2023 CLINICAL HISTORY: shortness of breath since friday FINDINGS: LUNGS AND PLEURA: Mild vascular congestion is noted without significant interstitial edema. Mild bibasilar atelectasis is noted. Small right-sided pleural effusion. No pneumothorax. HEART AND MEDIASTINUM: Cardiomegaly. Calcified aorta. BONES AND SOFT TISSUES: No acute osseous abnormality. IMPRESSION: 1. Mild vascular congestion without significant interstitial edema. 2. Mild  bibasilar atelectasis with Christopher [name] small right-sided pleural effusion. Electronically signed by: Christopher Devonshire MD 12/01/2024 11:30 PM EST RP Workstation: [id number]   US  Abdomen Limited RUQ (LIVER/GB) Result Date: 12/01/2024 EXAM: Right Upper Quadrant Abdominal Ultrasound 12/01/2024 08:56:48 AM TECHNIQUE: Real-time ultrasonography of the right upper quadrant of the abdomen was performed. COMPARISON: None available. CLINICAL HISTORY: Elevated liver enzymes. FINDINGS: LIVER: Heterogeneous, starry sky appearance of the hepatic parenchyma. No intrahepatic biliary ductal dilatation. No evidence of mass. BILIARY SYSTEM: Gallbladder wall thickening and pericholecystic fluid. Negative sonographic Murphy sign. The gallbladder wall measures up to 8 mm. No cholelithiasis. Common bile duct is within normal limits measuring 4.6 mm. OTHER: No right upper quadrant ascites. Hepatopetal flow in the portal vein. IMPRESSION: 1. Gallbladder wall thickening with pericholecystic fluid, without sonographic Murphy sign. Findings suggestive of acalculous acute cholecystitis. PRA call report. 2. Heterogeneous starry sky appearance of the hepatic parenchyma. Findings can be seen in the setting of hepatitis. Electronically signed by: Christopher Naveau MD 12/01/2024 09:04 PM EST RP Workstation: [id number]        Scheduled Meds:  apixaban   5 mg Oral BID   carvedilol   6.25 mg Oral BID   furosemide   40 mg Intravenous BID   insulin  aspart  0-5 Units Subcutaneous QHS   insulin  aspart  0-9 Units Subcutaneous TID WC   insulin  glargine-yfgn  10 Units Subcutaneous Daily   lisinopril   2.5 mg Oral Daily   Continuous Infusions:   LOS: 0 days    Time spent: over 30 min     Meliton Monte, MD [facility]   To contact the attending provider between 7A-7P or the covering provider during after hours 7P-7A, please log into the web site [email or web address] and access using universal [place] password for that web site. If you do not  have the password, please call the hospital operator.  12/02/2024, 8:18 AM

## 2024-12-02 NOTE — Care Management Obs Status (Signed)
 MEDICARE OBSERVATION STATUS NOTIFICATION   Patient Details  Name: NYMIR RINGLER MRN: [id number] Date of Birth: 1946/10/26   Medicare Observation Status Notification Given:  Yes    Debarah Saunas, RN 12/02/2024, 2:47 PM

## 2024-12-02 NOTE — Treatment Plan (Addendum)
 Patient with agitation/disorientation in the setting of dementia.  We're now planning to discharge him home when his wife returns after conversation.  Discussed with RN.  Please do not transfer upstairs.

## 2024-12-02 NOTE — Telephone Encounter (Signed)
 Patient called and states that patient was given 10 units of Lantus  this morning at 1030.  Patient will give a Novolog  injection before dinner tonight and put on a new POD and start the pump tomorrow morning about 8:30 before breakfast.  No further questions at this time.  Leita Constable, RD, LDN, CDCES, DipACLM

## 2024-12-03 ENCOUNTER — Other Ambulatory Visit (HOSPITAL_BASED_OUTPATIENT_CLINIC_OR_DEPARTMENT_OTHER)

## 2024-12-06 ENCOUNTER — Encounter (HOSPITAL_BASED_OUTPATIENT_CLINIC_OR_DEPARTMENT_OTHER): Payer: Self-pay

## 2024-12-06 ENCOUNTER — Ambulatory Visit (HOSPITAL_BASED_OUTPATIENT_CLINIC_OR_DEPARTMENT_OTHER): Admitting: Family

## 2024-12-06 VITALS — BP 132/64 | HR 87 | Ht 76.0 in | Wt 211.9 lb

## 2024-12-06 DIAGNOSIS — E108 Type 1 diabetes mellitus with unspecified complications: Secondary | ICD-10-CM

## 2024-12-06 DIAGNOSIS — I10 Essential (primary) hypertension: Secondary | ICD-10-CM

## 2024-12-06 DIAGNOSIS — I719 Aortic aneurysm of unspecified site, without rupture: Secondary | ICD-10-CM

## 2024-12-06 DIAGNOSIS — I4891 Unspecified atrial fibrillation: Secondary | ICD-10-CM

## 2024-12-06 DIAGNOSIS — E782 Mixed hyperlipidemia: Secondary | ICD-10-CM | POA: Diagnosis not present

## 2024-12-06 DIAGNOSIS — N1831 Chronic kidney disease, stage 3a: Secondary | ICD-10-CM | POA: Diagnosis not present

## 2024-12-06 DIAGNOSIS — I5020 Unspecified systolic (congestive) heart failure: Secondary | ICD-10-CM

## 2024-12-06 LAB — PRO B NATRIURETIC PEPTIDE: NT-Pro BNP: 2878 pg/mL — ABNORMAL HIGH (ref 0–486)

## 2024-12-06 LAB — COMPREHENSIVE METABOLIC PANEL WITH GFR
ALT: 61 IU/L — ABNORMAL HIGH (ref 0–44)
AST: 53 IU/L — ABNORMAL HIGH (ref 0–40)
Albumin: 3.7 g/dL — ABNORMAL LOW (ref 3.8–4.8)
Alkaline Phosphatase: 118 IU/L (ref 47–123)
BUN/Creatinine Ratio: 13 (ref 10–24)
BUN: 22 mg/dL (ref 8–27)
Bilirubin Total: 1.1 mg/dL (ref 0.0–1.2)
CO2: 23 mmol/L (ref 20–29)
Calcium: 8.8 mg/dL (ref 8.6–10.2)
Chloride: 105 mmol/L (ref 96–106)
Creatinine, Ser: 1.76 mg/dL — ABNORMAL HIGH (ref 0.76–1.27)
Globulin, Total: 1.8 g/dL (ref 1.5–4.5)
Glucose: 102 mg/dL — ABNORMAL HIGH (ref 70–99)
Potassium: 4.6 mmol/L (ref 3.5–5.2)
Sodium: 142 mmol/L (ref 134–144)
Total Protein: 5.5 g/dL — ABNORMAL LOW (ref 6.0–8.5)
eGFR: 39 mL/min/1.73 — ABNORMAL LOW

## 2024-12-06 LAB — CBC
Hematocrit: 48.5 % (ref 37.5–51.0)
Hemoglobin: 15.7 g/dL (ref 13.0–17.7)
MCH: 26.4 pg — ABNORMAL LOW (ref 26.6–33.0)
MCHC: 32.4 g/dL (ref 31.5–35.7)
MCV: 82 fL (ref 79–97)
Platelets: 229 x10E3/uL (ref 150–450)
RBC: 5.95 x10E6/uL — ABNORMAL HIGH (ref 4.14–5.80)
RDW: 17.5 % — ABNORMAL HIGH (ref 11.6–15.4)
WBC: 5.6 x10E3/uL (ref 3.4–10.8)

## 2024-12-06 LAB — MAGNESIUM: Magnesium: 1.9 mg/dL (ref 1.6–2.3)

## 2024-12-06 MED ORDER — FUROSEMIDE 40 MG PO TABS: ORAL_TABLET | ORAL | 2 refills | Status: DC

## 2024-12-06 NOTE — Patient Instructions (Addendum)
 Medication Instructions:   For 2 days, increase Furosemide  (Lasix ) to two tablets in the morning. Thereafter, return to Furosemide  (Lasix ) one tablet in the morning.  *If you need a refill on your cardiac medications before your next appointment, please call your pharmacy*  Lab Work: Your physician recommends that you return for lab work today for CBC, CMP, ProBNP, magnesium   If you have labs (blood work) drawn today and your tests are completely normal, you will receive your results only by: MyChart Message (if you have MyChart) OR A paper copy in the mail If you have any lab test that is abnormal or we need to change your treatment, we will call you to review the results.  Follow-Up: At [facility], you and your health needs are our priority.  As part of our continuing mission to provide you with exceptional heart care, our providers are all part of one team.  This team includes your primary Cardiologist (physician) and Advanced Practice Providers or APPs (Physician Assistants and Nurse Practitioners) who all work together to provide you with the care you need, when you need it.  Your next appointment:   As scheduled  We recommend signing up for the patient portal called MyChart.  Sign up information is provided on this After Visit Summary.  MyChart is used to connect with patients for Virtual Visits (Telemedicine).  Patients are able to view lab/test results, encounter notes, upcoming appointments, etc.  Non-urgent messages can be sent to your provider as well.   To learn more about what you can do with MyChart, go to [email or web address].   Other Instructions  To prevent or reduce lower extremity swelling: Eat a low salt diet. Salt makes the body hold onto extra fluid which causes swelling. Sit with legs elevated. For example, in the recliner or on an ottoman.  Wear knee-high compression stockings during the daytime. Ones labeled 15-20 mmHg provide good  compression.  To help prevent excess fluid: Recommend limiting to 64 oz (2 liters of fluid per day) Follow a low sodium diet  Recommend weighing daily and keeping a log. Please call our office if you have weight gain of 2 pounds overnight or 5 pounds in 1 week.   Date  Time Weight

## 2024-12-06 NOTE — Progress Notes (Signed)
 " Cardiology Office Note   Date:  12/06/2024  ID:  Riddick, Nuon 13-May-1946, MRN [id number] PCP: Merna Huxley, NP  [facility] Providers Cardiologist:  Madonna Large, DO     History of Present Illness Christopher [name] is a 78 y.o. male with history of CHF, hypertension, hyperlipidemia, TIA, venous insufficiency, dementia, and type 1 diabetes.    He was a former patient of Dr. Dann and transition to Dr. Large.  11/2022 hospitalized with elevated troponin peak of 4389 in the setting of acute renal failure and DKA.  Elevated troponin felt to be demand ischemia. Stress test 12/2022 showed no prior ischemia no prior myocardial infarction, low risk study.  10/01/2024 went to the ER with diabetic foot ulceration.  Heart rate was 30s-40s but confirmed to be NSR with frequent PVCs heart rate in the 80s.  He was last seen in office 11/01/2024 with reports of high blood pressure and appeared to be in atrial flutter.  Echo 11/04/2024 LVEF 45 to 50%, no RWMA, moderate LVH, RV systolic function mildly reduced, LA and RA severely dilated, mild MR, and mild dilation of aortic root measuring 42 mm.  Heart monitor results 11/25/2024 show atrial fibrillation 100% burden with average heart rate of 85 bpm.  Total ventricular ectopic burden of 28.4%, and longest episode of NSVT of 12 beats.  Referral to A-fib clinic and EP was placed for atrial fibrillation and high PVC burden.  He recently presented to the ED 12/01/2024 with shortness of breath and BLE edema.  He was admitted for CHF exacerbation, bilateral pleural effusions, and trace pericardial effusion.     He presents today with his wife for hospital follow-up in the setting of heart failure exacerbation.  He is noted to be a poor historian.  His wife reports his shortness of breath has improved upon being home from the hospital.  He is able to take care of himself.  He does wake up during the day gasping for air. He is not active and typically takes  minimal walking on a day-to-day basis.  He denies chest pain, palpitations, orthopnea.  He has bilateral lower extremity swelling.  He denies falls and syncopal episodes.  ROS: All systems negative unless otherwise indicated in HPI.  Studies Reviewed     Cardiac Studies & Procedures   ______________________________________________________________________________________________   STRESS TESTS  MYOCARDIAL PERFUSION IMAGING 12/17/2022  Interpretation Summary   Findings are consistent with no prior ischemia and no prior myocardial infarction. The study is low risk.   No ST deviation was noted.   Left ventricular function is abnormal. Global function is mildly reduced. Nuclear stress EF: 50 %. The left ventricular ejection fraction is mildly decreased (45-54%). End diastolic cavity size is mildly enlarged. End systolic cavity size is normal.   Prior study not available for comparison.  Negative stress test. Low normal ejection fraction.   ECHOCARDIOGRAM  ECHOCARDIOGRAM COMPLETE 11/08/2024  Narrative ECHOCARDIOGRAM REPORT    Patient Name:   Christopher [name] Date of Exam: 11/08/2024 Medical Rec #:  [id number]      Height:       76.0 in Accession #:    [id number]     Weight:       208.0 lb Date of Birth:  [birth year]      BSA:          2.252 m Patient Age:    78 years       BP:  170/96 mmHg Patient Gender: M              HR:           76 bpm. Exam Location:  [address]  Procedure: 2D Echo, Cardiac Doppler and Color Doppler (Both Spectral and Color Flow Doppler were utilized during procedure).  Indications:    I48.92 Atrial Flutter I49.3 Frequent PVC's  History:        Patient has prior history of Echocardiogram examinations, most recent 12/04/2022. TIA; Risk Factors:Hypertension and Dyslipidemia.  Sonographer:    Powell Saras Referring Phys: 5 DAYNA N DUNN  IMPRESSIONS   1. Left ventricular ejection fraction, by estimation, is 45 to 50%. The left ventricle  has mildly decreased function. The left ventricle has no regional wall motion abnormalities. There is moderate concentric left ventricular hypertrophy. Left ventricular diastolic function could not be evaluated. 2. Right ventricular systolic function is mildly reduced. The right ventricular size is normal. There is moderately elevated pulmonary artery systolic pressure. 3. Left atrial size was severely dilated. 4. Right atrial size was severely dilated. 5. The mitral valve is normal in structure. Mild mitral valve regurgitation. No evidence of mitral stenosis. 6. Tricuspid valve regurgitation is mild to moderate. 7. The aortic valve is tricuspid. Aortic valve regurgitation is not visualized. No aortic stenosis is present. 8. Aortic dilatation noted. There is mild dilatation of the aortic root, measuring 42 mm. 9. The inferior vena cava is dilated in size with <50% respiratory variability, suggesting right atrial pressure of 15 mmHg.  FINDINGS Left Ventricle: Left ventricular ejection fraction, by estimation, is 45 to 50%. The left ventricle has mildly decreased function. The left ventricle has no regional wall motion abnormalities. The left ventricular internal cavity size was normal in size. There is moderate concentric left ventricular hypertrophy. Left ventricular diastolic function could not be evaluated due to atrial fibrillation. Left ventricular diastolic function could not be evaluated.  Right Ventricle: The right ventricular size is normal. No increase in right ventricular wall thickness. Right ventricular systolic function is mildly reduced. There is moderately elevated pulmonary artery systolic pressure. The tricuspid regurgitant velocity is 2.96 m/s, and with an assumed right atrial pressure of 15 mmHg, the estimated right ventricular systolic pressure is 50.0 mmHg.  Left Atrium: Left atrial size was severely dilated.  Right Atrium: Right atrial size was severely  dilated.  Pericardium: Trivial pericardial effusion is present.  Mitral Valve: The mitral valve is normal in structure. Mild mitral valve regurgitation. No evidence of mitral valve stenosis.  Tricuspid Valve: The tricuspid valve is normal in structure. Tricuspid valve regurgitation is mild to moderate. No evidence of tricuspid stenosis.  Aortic Valve: The aortic valve is tricuspid. Aortic valve regurgitation is not visualized. No aortic stenosis is present.  Pulmonic Valve: The pulmonic valve was normal in structure. Pulmonic valve regurgitation is trivial. No evidence of pulmonic stenosis.  Aorta: Aortic dilatation noted. There is mild dilatation of the aortic root, measuring 42 mm.  Venous: The inferior vena cava is dilated in size with less than 50% respiratory variability, suggesting right atrial pressure of 15 mmHg.  IAS/Shunts: No atrial level shunt detected by color flow Doppler.   LEFT VENTRICLE PLAX 2D LVIDd:         4.50 cm LVIDs:         3.40 cm LV PW:         1.50 cm LV IVS:        1.50 cm LVOT diam:  2.50 cm LV SV:         70 LV SV Index:   31 LVOT Area:     4.91 cm  LV Volumes (MOD) LV vol d, MOD A2C: 142.0 ml LV vol d, MOD A4C: 151.0 ml LV vol s, MOD A2C: 74.9 ml LV vol s, MOD A4C: 81.7 ml LV SV MOD A2C:     67.1 ml LV SV MOD A4C:     151.0 ml LV SV MOD BP:      68.1 ml  RIGHT VENTRICLE            IVC RV Basal diam:  4.00 cm    IVC diam: 2.50 cm RV S prime:     8.07 cm/s TAPSE (M-mode): 1.3 cm  LEFT ATRIUM              Index        RIGHT ATRIUM           Index LA diam:        4.90 cm  2.18 cm/m   RA Area:     34.10 cm LA Vol (A2C):   97.8 ml  43.42 ml/m  RA Volume:   130.00 ml 57.71 ml/m LA Vol (A4C):   109.0 ml 48.39 ml/m LA Biplane Vol: 108.0 ml 47.95 ml/m AORTIC VALVE LVOT Vmax:   78.20 cm/s LVOT Vmean:  55.600 cm/s LVOT VTI:    0.142 m  AORTA Ao Root diam: 4.20 cm Ao Asc diam:  3.90 cm  MR Peak grad:    109.8 mmHg   TRICUSPID  VALVE MR Mean grad:    73.5 mmHg    TR Peak grad:   35.0 mmHg MR Vmax:         524.00 cm/s  TR Vmax:        296.00 cm/s MR Vmean:        399.5 cm/s MR PISA:         0.57 cm     SHUNTS MR PISA Eff ROA: 4 mm        Systemic VTI:  0.14 m MR PISA Radius:  0.30 cm      Systemic Diam: 2.50 cm  Annabella Scarce MD Electronically signed by Annabella Scarce MD Signature Date/Time: 11/08/2024/11:58:00 AM    Final    MONITORS  LONG TERM MONITOR (3-14 DAYS) 11/25/2024  Narrative Cardiac monitor (Zio Patch): 11/05/2024 - 11/18/2024 Dominant rhythm atrial fibrillation, 100% burden. Heart rate 49-199 bpm.  Avg HR 85 bpm. No high grade AV block, pauses (3 seconds or longer). Total supraventricular ectopic burden 0% Total ventricular ectopic burden 28.4% (isolated 26.6%, couplets 1.8%). 3 auto triggered events noted NSVT. Longest episode of NSVT: 11/17/2024 at 4:22 PM, 12 beats, 4.4 seconds, average HR 169 bpm. Patient triggered events: 0.       ______________________________________________________________________________________________      Risk Assessment/Calculations  CHA2DS2-VASc Score = 5   This indicates a 7.2% annual risk of stroke. The patient's score is based upon: CHF History: 1 HTN History: 1 Diabetes History: 1 Stroke History: 0 Vascular Disease History: 0 Age Score: 2 Gender Score: 0            Physical Exam VS:  BP 132/64   Pulse 87   Ht 6' 4 (1.93 m)   Wt 211 lb 14.4 oz (96.1 kg)   SpO2 99%   BMI 25.79 kg/m        Wt Readings from Last 3 Encounters:  12/06/24 211 lb  14.4 oz (96.1 kg)  12/01/24 205 lb (93 kg)  11/19/24 211 lb (95.7 kg)    GEN: Well nourished, well developed in no acute distress NECK: No JVD; No carotid bruits CARDIAC: IRIR, no murmurs, rubs, gallops RESPIRATORY:  Clear to auscultation without rales, wheezing or rhonchi  ABDOMEN: Soft, non-tender, non-distended EXTREMITIES:  BLE +2 edema,  No deformity   ASSESSMENT AND  PLAN  HFmrEF-echo 11/08/2024 LV EF 45 to 50%, no RWMA, moderate LVH, RV systolic function mildly reduced, severely dilated LA and RA, and mild dilation of aortic root measuring 42 mm.  Today his wife reports improvement in SOB since returning from hospital.  He is able to complete his daily activities of living.  He does have BLE +2 edema noted on exam.  Will increase Lasix  to 80 mg for 2 days then return to 40 mg daily. Repeat c-Met, CBC, and proBNP. Recommend weighing daily and keeping a log. Please call our office if you have weight gain of 2 pounds overnight or 5 pounds in 1 week.  Continue carvedilol  12.5 mg, furosemide  40 mg, potassium 20, and lisinopril  2.5 mg.  Could consider adding spironolactone.  Hypertension- Blood pressure in hospital SBP 130s-160s.  Upon rechecking BP today, he was noted to be 132/64.  Continue current blood pressure regimen of lisinopril  2.5 mg.  Atrial Fibrillation- No EKG today.  Continue Eliquis  5 mg and carvedilol  12.5 mg twice daily.  Follow-up with A-fib clinic as scheduled. CHA2DS2-VASc Score = 5 [CHF History: 1, HTN History: 1, Diabetes History: 1, Stroke History: 0, Vascular Disease History: 0, Age Score: 2, Gender Score: 0].  Therefore, the patient's annual risk of stroke is 7.2 %.     Aortic aneurysm - Mild dilation of the aortic root, measuring 42 mm. Will continue yearly surveillance.  Hyperlipidemia- Last LDL 149 4//2024.  He was not fasting today.  He is currently not on statin therapy due to previous elevated liver enzymes.  Type 1 diabetes - Continue follow-up with endocrinology.  CKD stage 3a - Creatinine baseline around 1.4. Continue to monitor.       Dispo: Follow up with as scheduled 12/31.   Signed, Mardy KATHEE Pizza, FNP  "

## 2024-12-07 ENCOUNTER — Ambulatory Visit (HOSPITAL_BASED_OUTPATIENT_CLINIC_OR_DEPARTMENT_OTHER): Payer: Self-pay

## 2024-12-10 ENCOUNTER — Ambulatory Visit (HOSPITAL_COMMUNITY)
Admission: RE | Admit: 2024-12-10 | Discharge: 2024-12-10 | Disposition: A | Discharge status: Home / Self Care | Source: Ambulatory Visit | Attending: Physician Assistant | Admitting: Physician Assistant

## 2024-12-10 VITALS — BP 120/80 | HR 76 | Ht 76.0 in | Wt 207.4 lb

## 2024-12-10 DIAGNOSIS — D6869 Other thrombophilia: Secondary | ICD-10-CM | POA: Diagnosis not present

## 2024-12-10 DIAGNOSIS — I4819 Other persistent atrial fibrillation: Secondary | ICD-10-CM | POA: Diagnosis not present

## 2024-12-10 DIAGNOSIS — I4891 Unspecified atrial fibrillation: Secondary | ICD-10-CM

## 2024-12-10 NOTE — Progress Notes (Signed)
 "   Primary Care Physician: Merna Huxley, NP Primary Cardiologist: Madonna Large, DO Electrophysiologist: None  Referring Physician: Raphael Bring PA   Christopher [name] is a 78 y.o. male with a history of CHF, HTN, HLD, TIA, venous insufficiency, dementia, DM type 1, atrial fibrillation who presents for follow up in the [facility]. He was seen in office 11/01/2024 with reports of high blood pressure and appeared to be in atrial flutter. Echo 11/04/2024 LVEF 45 to 50%, LA and RA severely dilated, mild MR. Heart monitor results 11/25/2024 show atrial fibrillation 100% burden with average heart rate of 85 bpm. He presented to the ED 12/01/2024 with shortness of breath and BLE edema. He was admitted for CHF exacerbation. Patient is on Eliquis  for stroke prevention.    Patient presents today for follow up for atrial fibrillation. He remains in rate controlled afib and is unaware of his arrhythmia. His wife helps to provide some history today. No bleeding issues on anticoagulation.   Today, he denies symptoms of palpitations, chest pain, shortness of breath, orthopnea, PND, lower extremity edema, dizziness, presyncope, syncope, snoring, daytime somnolence, bleeding, or neurologic sequela. The patient is tolerating medications without difficulties and is otherwise without complaint today.    Atrial Fibrillation Risk Factors:  he does not have symptoms or diagnosis of sleep apnea. he does not have a history of rheumatic fever.   Atrial Fibrillation Management history:  Previous antiarrhythmic drugs: none Previous cardioversions: none Previous ablations: none Anticoagulation history: Eliquis   ROS- All systems are reviewed and negative except as per the HPI above.  Past Medical History:  Diagnosis Date   Atrial fibrillation (HCC)    Dementia (HCC)    Diabetes mellitus type II    GERD (gastroesophageal reflux disease)    Glaucoma    Hyperlipidemia    Hypertension      Current Outpatient Medications  Medication Sig Dispense Refill   apixaban  (ELIQUIS ) 5 MG TABS tablet Take 1 tablet (5 mg total) by mouth 2 (two) times daily. 60 tablet 1   carvedilol  (COREG ) 12.5 MG tablet Take 1 tablet (12.5 mg total) by mouth 2 (two) times daily. 60 tablet 0   Continuous Glucose Sensor (DEXCOM G6 SENSOR) MISC Change every 10 days 9 each 3   Continuous Glucose Transmitter (DEXCOM G6 TRANSMITTER) MISC Use every 3 months 1 each 3   furosemide  (LASIX ) 40 MG tablet Take two tablets (80mg ) daily for 2 days then reduce to one tablet daily. (Patient taking differently: Take 40 mg by mouth daily.) 35 tablet 2   insulin  aspart (NOVOLOG  FLEXPEN) 100 UNIT/ML FlexPen Inject 5 Units into the skin 3 (three) times daily with meals. To use as a backup when insulin  pump is not working. 15 mL 2   Insulin  Disposable Pump (OMNIPOD 5 DEXG7G6 PODS GEN 5) MISC CHANGE POD EVERY 3 DAYS 30 each 3   Insulin  Pen Needle 32G X 4 MM MISC Use 4x a day with insulin  pens to administer insulin  100 each 3   latanoprost (XALATAN) 0.005 % ophthalmic solution Place 1 drop into both eyes at bedtime.     lisinopril  (ZESTRIL ) 2.5 MG tablet Take 1 tablet by mouth once daily 90 tablet 0   magnesium  oxide (MAG-OX) 400 MG tablet Take 1 tablet (400 mg total) by mouth daily.     Multiple Vitamins-Minerals (MULTIVITAMIN ADULTS 50+) TABS Take 1 tablet by mouth daily.     NOVOLOG  100 UNIT/ML injection INJECT UP TO 60 UNITS SUBCUTANEOUSLY ONCE  DAILY IN  INSULIN   PUMP 50 mL 3   potassium chloride  SA (KLOR-CON  M) 20 MEQ tablet Take 1 tablet (20 mEq total) by mouth daily. Take with lasix .  Repeat labs within 1 week with your PCP or cardiology 30 tablet 0   VITAMIN D  PO Take 1 tablet by mouth every morning.     insulin  glargine (LANTUS  SOLOSTAR) 100 UNIT/ML Solostar Pen Inject 20 Units into the skin daily. To use when insulin  pump is not working, as a backup. (Patient not taking: Reported on 12/10/2024) 15 mL PRN   No current  facility-administered medications for this encounter.    Physical Exam: BP 120/80   Pulse 76   Ht 6' 4 (1.93 m)   Wt 94.1 kg   BMI 25.25 kg/m   GEN: Well nourished, well developed in no acute distress CARDIAC: Irregularly irregular rate and rhythm, no murmurs, rubs, gallops RESPIRATORY:  Clear to auscultation without rales, wheezing or rhonchi  ABDOMEN: Soft, non-tender, non-distended EXTREMITIES:  No edema; No deformity   Wt Readings from Last 3 Encounters:  12/10/24 94.1 kg  12/06/24 96.1 kg  12/01/24 93 kg     EKG Interpretation Date/Time:  Friday December 10 2024 14:08:30 EST Ventricular Rate:  76 PR Interval:    QRS Duration:  80 QT Interval:  394 QTC Calculation: 443 R Axis:   247  Text Interpretation: Atrial flutter with variable A-V block with premature ventricular or aberrantly conducted complexes Right superior axis deviation Anterior infarct , age undetermined Abnormal ECG When compared with ECG of 02-Dec-2024 05:44, No significant change was found Confirmed by Liban Guedes ([id number]) on 12/10/2024 2:23:00 PM    Echo 11/08/24 demonstrated   1. Left ventricular ejection fraction, by estimation, is 45 to 50%. The  left ventricle has mildly decreased function. The left ventricle has no  regional wall motion abnormalities. There is moderate concentric left  ventricular hypertrophy. Left ventricular diastolic function could not be evaluated.   2. Right ventricular systolic function is mildly reduced. The right  ventricular size is normal. There is moderately elevated pulmonary artery  systolic pressure.   3. Left atrial size was severely dilated.   4. Right atrial size was severely dilated.   5. The mitral valve is normal in structure. Mild mitral valve  regurgitation. No evidence of mitral stenosis.   6. Tricuspid valve regurgitation is mild to moderate.   7. The aortic valve is tricuspid. Aortic valve regurgitation is not  visualized. No aortic stenosis is  present.   8. Aortic dilatation noted. There is mild dilatation of the aortic root,  measuring 42 mm.   9. The inferior vena cava is dilated in size with <50% respiratory  variability, suggesting right atrial pressure of 15 mmHg.    CHA2DS2-VASc Score = 5  The patient's score is based upon: CHF History: 1 HTN History: 1 Diabetes History: 1 Stroke History: 0 Vascular Disease History: 0 Age Score: 2 Gender Score: 0       ASSESSMENT AND PLAN: Persistent Atrial Fibrillation/atrial flutter (ICD10:  I48.19) The patient's CHA2DS2-VASc score is 5, indicating a 7.2% annual risk of stroke.   Recent monitor showed 100% afib burden. We discussed rhythm control options today.  Will plan for DCCV. Recent labs reviewed.  Continue carvedilol  12.5 mg BID Continue Eliquis  5 mg BID  Secondary Hypercoagulable State (ICD10:  D68.69) The patient is at significant risk for stroke/thromboembolism based upon his CHA2DS2-VASc Score of 5.  Continue Apixaban  (Eliquis ). No bleeding issues.  HFmrEF EF 45-50% GDMT per primary cardiology team Fluid status appears stable today  HTN Stable on current regimen   Follow up in the AF clinic post DCCV.     Informed Consent   Shared Decision Making/Informed Consent The risks (stroke, cardiac arrhythmias rarely resulting in the need for a temporary or permanent pacemaker, skin irritation or burns and complications associated with conscious sedation including aspiration, arrhythmia, respiratory failure and death), benefits (restoration of normal sinus rhythm) and alternatives of a direct current cardioversion were explained in detail to Christopher [name] and he agrees to proceed.       [facility] [address] [place], [place] [address] [phone] "

## 2024-12-10 NOTE — Patient Instructions (Signed)
 Cardioversion scheduled for: Monday January 5th   - Arrive at the [facility] ([address]  and check in with ADMITTING at 7:30 AM   - Do not eat or drink anything after midnight the night prior to your procedure.   - Take all your morning medication (except diabetic medications) with a sip of water  prior to arrival.    - Do NOT miss any doses of your blood thinner - if you should miss a dose or take a dose more than 4 hours late -- please notify our office immediately.  - You will not be able to drive home after your procedure. Please ensure you have a responsible adult to drive you home. You will need someone with you for 24 hours post procedure.     - Expect to be in the procedural area approximately 2 hours.   - If you feel as if you go back into normal rhythm prior to scheduled cardioversion, please notify our office immediately.   If your procedure is canceled in the cardioversion suite you will be charged a cancellation fee.

## 2024-12-14 ENCOUNTER — Other Ambulatory Visit: Payer: Self-pay | Admitting: Adult Health

## 2024-12-14 ENCOUNTER — Encounter: Payer: Self-pay | Admitting: Adult Health

## 2024-12-14 ENCOUNTER — Ambulatory Visit: Admitting: Adult Health

## 2024-12-14 VITALS — BP 110/70 | HR 77 | Temp 97.6°F | Ht 76.0 in | Wt 209.0 lb

## 2024-12-14 DIAGNOSIS — R748 Abnormal levels of other serum enzymes: Secondary | ICD-10-CM | POA: Diagnosis not present

## 2024-12-14 DIAGNOSIS — I152 Hypertension secondary to endocrine disorders: Secondary | ICD-10-CM

## 2024-12-14 DIAGNOSIS — E108 Type 1 diabetes mellitus with unspecified complications: Secondary | ICD-10-CM

## 2024-12-14 NOTE — Progress Notes (Unsigned)
 " Cardiology Office Note:    Date:  12/15/2024   ID:  Christopher [name], DOB 05/16/1946, MRN [id number]  PCP:  Merna Huxley, NP   [facility] Providers Cardiologist:  Madonna Large, DO     Referring MD: Merna Huxley, NP   Chief complaint: Hospital follow-up     History of Present Illness:   Christopher [name] is a 78 y.o. male with a hx of  HTN, HLD managed by PCP, mild dementia, CKD 3a, diabetes mellitus T1, prior DKA seen for follow-up of recent hospitalization secondary to pericardial effusion.  The patient was previously seen by Dr. Dann then transitioned to Dr. Large. He had a prior elevated troponin peak of 4,389 while in the hospital 11/2022 in setting of acute renal failure and DKA. Echocardiogram had shown EF 50-55%, moderate LVH, G1DD, mildly reduced RV function, mild MR, dilated IVC. Stress test 12/2022 showed no prior ischemia and no prior myocardial infarction, low risk study.  Elevated troponin felt to be demand ischemia.   ER visit 10/01/2024's 2/2 diabetic foot ulceration, HR observed in the 30s-40s, NSR, frequent PVCs, advised to follow-up with cardiology.  Follow-up with Dayna Dunn in November 2025 patient was in newly recognized atrial flutter with frequent PVCs versus aberrant conduction, unaware of his rhythm.  Started on Coreg  and Eliquis .  ZIO monitoring from 11/21-12/04: Dominant rhythm atrial fibrillation, 100% burden, HR 49-199 bpm, average 85 bpm, no high-grade AV block or pauses, total SVE burden 0%, total ventricular ectopic burden 28.4%, 3 auto triggered events noted NSVT, longest episode of NSVT lasting 12 beats, 4 seconds, patient triggered events 0.  Echo 11/08/2024: LVEF 45 to 50%, mildly decreased LV function, no RWMA, moderate concentric LVH, RV SF mildly reduced, moderately elevated PASP, severe dilatation of left and right atria, mild MV regurg, moderate tricuspid regurg, normal AV, mild dilatation of the aortic root measuring 42 mm, suggested right  atrial pressure 15 mmHg.  Presented to the ED 12/01/2024 complaining of moderate shortness of breath with a gradual onset, rhonchi and edema present, creatinine 1.7, AST 59, ALT 72 troponin 71-> 66, BNP 3538, CTA chest PE demonstrating no PE, right perihilar ground glass pulmonary infiltrate and mild.  Bronchovascular interstitial thickening, favoring asymmetric perihilar pulmonary edema in the setting of early cardiogenic failure, moderate right and small left pleural effusions with associated bibasilar atelectasis, mild global cardiomegaly and trace pericardial effusion, small hiatal hernia.  There was question of whether cholecystitis was cause for symptoms following abnormal right upper quadrant ultrasound and elevated liver enzymes, general surgery consulted.  HIDA and MRCP canceled due to patient's inability to participate with exam without sedation and lack of abdominal symptoms.  Patient did become agitated while awaiting admission.  Dementia limited the ability to provide therapeutic care during admission.  Following discussion with wife, they opted to discharge patient rather than keep him in the hospital with restraints and antipsychotics.  Last seen in office 12/06/2024, shortness of breath had improved since leaving the hospital.  Lasix  was increased to 80 mg for 2 days, then to return to 40 mg daily following LE edema.  Seen with A-fib clinic 12/10/2024, remained in rate controled atrial fibrillation with plans for DCCV.   Presented with wife Christopher [name] in clinic, appears stable from cardiovascular standpoint. He denies chest pain, palpitations, dyspnea, orthopnea, n, v,  dark/tarry/bloody stools, hematuria, dizziness, syncope, weight gain. Lasix  improved lower extremity edema, only mild now, has not been keeping legs elevated. Wife states they've not missed any  doses of eliquis . BP reported well controlled at home.   ROS:   Please see the history of present illness.    All other systems reviewed  and are negative.     Past Medical History:  Diagnosis Date   Atrial fibrillation (HCC)    Dementia (HCC)    Diabetes mellitus type II    GERD (gastroesophageal reflux disease)    Glaucoma    Hyperlipidemia    Hypertension     Past Surgical History:  Procedure Laterality Date   COLONOSCOPY     HERNIA REPAIR     PATELLA FRACTURE SURGERY  1993    Current Medications: Active Medications[1]   Allergies:   Patient has no known allergies.   Social History   Socioeconomic History   Marital status: Married    Spouse name: Not on file   Number of children: Not on file   Years of education: Not on file   Highest education level: Not on file  Occupational History   Not on file  Tobacco Use   Smoking status: Never   Smokeless tobacco: Never  Vaping Use   Vaping status: Never Used  Substance and Sexual Activity   Alcohol use: No   Drug use: No   Sexual activity: Not Currently  Other Topics Concern   Not on file  Social History Narrative   He enjoys playing golf    Social Drivers of Health   Tobacco Use: Low Risk (12/15/2024)   Patient History    Smoking Tobacco Use: Never    Smokeless Tobacco Use: Never    Passive Exposure: Not on file  Financial Resource Strain: Low Risk (09/01/2024)   Overall Financial Resource Strain (CARDIA)    Difficulty of Paying Living Expenses: Not hard at all  Food Insecurity: No Food Insecurity (09/01/2024)   Epic    Worried About Programme Researcher, Broadcasting/film/video in the Last Year: Never true    Ran Out of Food in the Last Year: Never true  Transportation Needs: No Transportation Needs (09/01/2024)   Epic    Lack of Transportation (Medical): No    Lack of Transportation (Non-Medical): No  Physical Activity: Inactive (09/01/2024)   Exercise Vital Sign    Days of Exercise per Week: 0 days    Minutes of Exercise per Session: 0 min  Stress: No Stress Concern Present (09/01/2024)   [employer] of Occupational Health - Occupational Stress  Questionnaire    Feeling of Stress: Not at all  Social Connections: Socially Integrated (09/01/2024)   Social Connection and Isolation Panel    Frequency of Communication with Friends and Family: More than three times a week    Frequency of Social Gatherings with Friends and Family: More than three times a week    Attends Religious Services: More than 4 times per year    Active Member of Clubs or Organizations: Yes    Attends Banker Meetings: More than 4 times per year    Marital Status: Married  Depression (PHQ2-9): Low Risk (09/01/2024)   Depression (PHQ2-9)    PHQ-2 Score: 0  Alcohol Screen: Low Risk (09/01/2024)   Alcohol Screen    Last Alcohol Screening Score (AUDIT): 0  Housing: Unknown (09/01/2024)   Epic    Unable to Pay for Housing in the Last Year: No    Number of Times Moved in the Last Year: Not on file    Homeless in the Last Year: No  Utilities: Not At Risk (09/01/2024)  Epic    Threatened with loss of utilities: No  Health Literacy: Adequate Health Literacy (09/01/2024)   B1300 Health Literacy    Frequency of need for help with medical instructions: Never     Family History: The patient's family history includes Alcohol abuse in his father; Diabetes in his brother, brother, and sister. There is no history of Colon cancer, Esophageal cancer, Stomach cancer, or Rectal cancer.  EKGs/Labs/Other Studies Reviewed:    The following studies were reviewed today:  EKG Interpretation Date/Time:  Wednesday December 15 2024 09:36:47 EST Ventricular Rate:  78 PR Interval:    QRS Duration:  94 QT Interval:  406 QTC Calculation: 462 R Axis:   253  Text Interpretation: Atrial flutter with variable A-V block with premature ventricular or aberrantly conducted complexes Right superior axis deviation T wave abnormality, consider inferolateral ischemia Prolonged QT When compared with ECG of 10-Dec-2024 14:08, T wave inversion now evident in Lateral leads No significant  change from prior study Confirmed by Elaine Moloney [phone]) on 12/15/2024 9:43:41 AM    Recent Labs: 11/10/2024: TSH 1.700 12/06/2024: ALT 61; BUN 22; Creatinine, Ser 1.76; Hemoglobin 15.7; Magnesium  1.9; NT-Pro BNP 2,878; Platelets 229; Potassium 4.6; Sodium 142  Recent Lipid Panel    Component Value Date/Time   CHOL 214 (H) 03/20/2023 0832   TRIG 67.0 03/20/2023 0832   HDL 51.20 03/20/2023 0832   CHOLHDL 4 03/20/2023 0832   VLDL 13.4 03/20/2023 0832   LDLCALC 149 (H) 03/20/2023 0832   LDLDIRECT 135.3 06/17/2013 0838     Risk Assessment/Calculations:    CHA2DS2-VASc Score = 5   This indicates a 7.2% annual risk of stroke. The patient's score is based upon: CHF History: 1 HTN History: 1 Diabetes History: 1 Stroke History: 0 Vascular Disease History: 0 Age Score: 2 Gender Score: 0     Physical Exam:    VS:  BP (!) 128/90 (BP Location: Left Arm, Patient Position: Sitting, Cuff Size: Normal)   Pulse 78   Ht 6' 4 (1.93 m)   Wt 210 lb (95.3 kg)   SpO2 97%   BMI 25.56 kg/m        Wt Readings from Last 3 Encounters:  12/15/24 210 lb (95.3 kg)  12/14/24 209 lb (94.8 kg)  12/10/24 207 lb 6.4 oz (94.1 kg)     GEN:  Well nourished, well developed in no acute distress HEENT: Normal NECK: No carotid bruits CARDIAC:  S1-S2 normal, RRR, no murmurs, rubs, gallops RESPIRATORY:  Clear to auscultation without rales, wheezing or rhonchi  MUSCULOSKELETAL:  Mild 1+ pitting edema bilaterally; No deformity  SKIN: Warm and dry NEUROLOGIC:  Alert and oriented x 3 PSYCHIATRIC:  Normal affect       Assessment & Plan Atrial fibrillation, unspecified type (HCC) Atrial flutter, unspecified type (HCC) EKG: Atrial flutter with variable A-V block with premature ventricular or aberrantly conducted complexes Right superior axis deviation T wave abnormality, consider inferolateral ischemia Prolonged QT. No significant change since prior study DCCV planned in 1 week. Denies missing  any doses of eliquis  over last 3 weeks. Notify office for any missed doses or change in symptoms Denies SOB, orthopnea, PND. Not cardiac aware. ED precautions discussed Continue coreg  12.5 mg BID, eliquis  5 mg bid Heart failure with mildly reduced ejection fraction (HFmrEF) (HCC) Echo 11/08/2024: LVEF 45 to 50%, mildly decreased LV function, no RWMA, moderate concentric LVH LE edema improved w/ recent transient increase in lasix  Denies symptoms as above Weights stable Appears euvolemic Continue  coreg  12.5 mg BID, lasix  40 mg daily, lisinopril  2.5 mg daily, potassium 20 meq w/ lasix  Type 1 diabetes mellitus with unspecified complications (HCC) Managed by endocrinology. On long-term insulin  Hypertension associated with diabetes (HCC) Continue to monitor BP at home, keep log and bring back to next appointment.  Continue lisinopril  2.5 mg daily, coreg  as above Aortic aneurysm without rupture, unspecified portion of aorta Mild dilation of the aortic root, measuring 42 mm. Will continue yearly surveillance   Disposition: Proceed w/ DCCV as scheduled. Follow up in 2 months with gen cards. Notify office if anything changes. Proceed to ED if symptoms change/worsen.           Medication Adjustments/Labs and Tests Ordered: Current medicines are reviewed at length with the patient today.  Concerns regarding medicines are outlined above.  Orders Placed This Encounter  Procedures   EKG 12-Lead   Meds ordered this encounter  Medications   apixaban  (ELIQUIS ) 5 MG TABS tablet    Sig: Take 1 tablet (5 mg total) by mouth 2 (two) times daily.    Dispense:  60 tablet    Refill:  5    NEW START   carvedilol  (COREG ) 12.5 MG tablet    Sig: Take 1 tablet (12.5 mg total) by mouth 2 (two) times daily.    Dispense:  180 tablet    Refill:  3    ok to change to 12.5mg  tabs, #60 per MD/maf   furosemide  (LASIX ) 40 MG tablet    Sig: Take 1 tablet (40 mg total) by mouth daily.    Dispense:  90 tablet     Refill:  3   lisinopril  (ZESTRIL ) 2.5 MG tablet    Sig: Take 1 tablet (2.5 mg total) by mouth daily.    Dispense:  90 tablet    Refill:  3   potassium chloride  SA (KLOR-CON  M) 20 MEQ tablet    Sig: Take 1 tablet (20 mEq total) by mouth daily. Take with lasix .    Dispense:  90 tablet    Refill:  3    Patient Instructions  Medication Instructions:  Your physician recommends that you continue on your current medications as directed. Please refer to the Current Medication list given to you today.  *If you need a refill on your cardiac medications before your next appointment, please call your pharmacy*  Lab Work: NONE ordered at this time of appointment   Testing/Procedures: NONE ordered at this time of appointment   Follow-Up: At [facility], you and your health needs are our priority.  As part of our continuing mission to provide you with exceptional heart care, our providers are all part of one team.  This team includes your primary Cardiologist (physician) and Advanced Practice Providers or APPs (Physician Assistants and Nurse Practitioners) who all work together to provide you with the care you need, when you need it.  Your next appointment:   2 month(s)  Provider:   Madonna Large, DO    We recommend signing up for the patient portal called MyChart.  Sign up information is provided on this After Visit Summary.  MyChart is used to connect with patients for Virtual Visits (Telemedicine).  Patients are able to view lab/test results, encounter notes, upcoming appointments, etc.  Non-urgent messages can be sent to your provider as well.   To learn more about what you can do with MyChart, go to [email or web address].   Other Instructions Ok to stop Lasix  1 day before procedure  Signed, Miriam FORBES Shams, NP  12/15/2024 11:02 AM    [facility]     [1]  Current Meds  Medication Sig   Continuous Glucose Sensor (DEXCOM G6 SENSOR) MISC Change  every 10 days   Continuous Glucose Transmitter (DEXCOM G6 TRANSMITTER) MISC Use every 3 months   insulin  aspart (NOVOLOG  FLEXPEN) 100 UNIT/ML FlexPen Inject 5 Units into the skin 3 (three) times daily with meals. To use as a backup when insulin  pump is not working.   Insulin  Disposable Pump (OMNIPOD 5 DEXG7G6 PODS GEN 5) MISC CHANGE POD EVERY 3 DAYS   Insulin  Pen Needle 32G X 4 MM MISC Use 4x a day with insulin  pens to administer insulin    latanoprost (XALATAN) 0.005 % ophthalmic solution Place 1 drop into both eyes at bedtime.   magnesium  oxide (MAG-OX) 400 MG tablet Take 1 tablet (400 mg total) by mouth daily.   Multiple Vitamins-Minerals (MULTIVITAMIN ADULTS 50+) TABS Take 1 tablet by mouth daily.   NOVOLOG  100 UNIT/ML injection INJECT UP TO 60 UNITS SUBCUTANEOUSLY ONCE DAILY IN  INSULIN   PUMP   VITAMIN D  PO Take 1 tablet by mouth every morning.   [DISCONTINUED] apixaban  (ELIQUIS ) 5 MG TABS tablet Take 1 tablet (5 mg total) by mouth 2 (two) times daily.   [DISCONTINUED] carvedilol  (COREG ) 12.5 MG tablet Take 1 tablet (12.5 mg total) by mouth 2 (two) times daily.   [DISCONTINUED] furosemide  (LASIX ) 40 MG tablet Take two tablets (80mg ) daily for 2 days then reduce to one tablet daily. (Patient taking differently: Take 40 mg by mouth daily.)   [DISCONTINUED] lisinopril  (ZESTRIL ) 2.5 MG tablet Take 1 tablet by mouth once daily   [DISCONTINUED] potassium chloride  SA (KLOR-CON  M) 20 MEQ tablet Take 1 tablet (20 mEq total) by mouth daily. Take with lasix .  Repeat labs within 1 week with your PCP or cardiology   "

## 2024-12-14 NOTE — Progress Notes (Signed)
 "  Subjective:    Patient ID: Christopher [name], male    DOB: 19-Jul-1946, 78 y.o.   MRN: [id number]  HPI  Discussed the use of AI scribe software for clinical note transcription with the patient, who gave verbal consent to proceed.  History of Present Illness   Christopher [name] is a 78 year old male who presents to the office today for follow up   He had an ultrasound of the RUQ done on 12/01/2024 for concern of elevated liver enzymes. His US  showed    1. Gallbladder wall thickening with pericholecystic fluid, without sonographic Murphy sign. Findings suggestive of acalculous acute cholecystitis. PRA call report. 2. Heterogeneous starry sky appearance of the hepatic parenchyma. Findings can be seen in the setting of hepatitis.   He has no abdominal pain, including no right upper quadrant pain, and no nausea, vomiting, or diarrhea.       Review of Systems See HPI   Past Medical History:  Diagnosis Date   Atrial fibrillation (HCC)    Dementia (HCC)    Diabetes mellitus type II    GERD (gastroesophageal reflux disease)    Glaucoma    Hyperlipidemia    Hypertension     Social History   Socioeconomic History   Marital status: Married    Spouse name: Not on file   Number of children: Not on file   Years of education: Not on file   Highest education level: Not on file  Occupational History   Not on file  Tobacco Use   Smoking status: Never   Smokeless tobacco: Never  Vaping Use   Vaping status: Never Used  Substance and Sexual Activity   Alcohol use: No   Drug use: No   Sexual activity: Not Currently  Other Topics Concern   Not on file  Social History Narrative   He enjoys playing golf    Social Drivers of Health   Tobacco Use: Low Risk (12/14/2024)   Patient History    Smoking Tobacco Use: Never    Smokeless Tobacco Use: Never    Passive Exposure: Not on file  Financial Resource Strain: Low Risk (09/01/2024)   Overall Financial Resource Strain (CARDIA)     Difficulty of Paying Living Expenses: Not hard at all  Food Insecurity: No Food Insecurity (09/01/2024)   Epic    Worried About Programme Researcher, Broadcasting/film/video in the Last Year: Never true    Ran Out of Food in the Last Year: Never true  Transportation Needs: No Transportation Needs (09/01/2024)   Epic    Lack of Transportation (Medical): No    Lack of Transportation (Non-Medical): No  Physical Activity: Inactive (09/01/2024)   Exercise Vital Sign    Days of Exercise per Week: 0 days    Minutes of Exercise per Session: 0 min  Stress: No Stress Concern Present (09/01/2024)   [name]    Feeling of Stress: Not at all  Social Connections: Socially Integrated (09/01/2024)   Social Connection and Isolation Panel    Frequency of Communication with Friends and Family: More than three times a week    Frequency of Social Gatherings with Friends and Family: More than three times a week    Attends Religious Services: More than 4 times per year    Active Member of [employer] or Organizations: Yes    Attends Engineer, Structural: More than 4 times per year    Marital Status: Married  Intimate Partner Violence: Not At Risk (09/01/2024)   Epic    Fear of Current or Ex-Partner: No    Emotionally Abused: No    Physically Abused: No    Sexually Abused: No  Depression (PHQ2-9): Low Risk (09/01/2024)   Depression (PHQ2-9)    PHQ-2 Score: 0  Alcohol Screen: Low Risk (09/01/2024)   Alcohol Screen    Last Alcohol Screening Score (AUDIT): 0  Housing: Unknown (09/01/2024)   Epic    Unable to Pay for Housing in the Last Year: No    Number of Times Moved in the Last Year: Not on file    Homeless in the Last Year: No  Utilities: Not At Risk (09/01/2024)   Epic    Threatened with loss of utilities: No  Health Literacy: Adequate Health Literacy (09/01/2024)   B1300 Health Literacy    Frequency of need for help with medical instructions: Never     Past Surgical History:  Procedure Laterality Date   COLONOSCOPY     HERNIA REPAIR     PATELLA FRACTURE SURGERY  1993    Family History  Problem Relation Age of Onset   Alcohol abuse Father    Diabetes Sister    Diabetes Brother    Diabetes Brother    Colon cancer Neg Hx    Esophageal cancer Neg Hx    Stomach cancer Neg Hx    Rectal cancer Neg Hx     Allergies[1]  Medications Ordered Prior to Encounter[2]  BP 110/70   Pulse 77   Temp 97.6 F (36.4 C) (Oral)   Ht 6' 4 (1.93 m)   Wt 209 lb (94.8 kg)   SpO2 97%   BMI 25.44 kg/m       Objective:   Physical Exam Vitals and nursing note reviewed.  Constitutional:      Appearance: Normal appearance.  Abdominal:     General: Abdomen is flat. Bowel sounds are normal.     Palpations: Abdomen is soft. There is no mass.     Tenderness: There is no abdominal tenderness. There is no guarding or rebound.  Musculoskeletal:        General: Normal range of motion.  Skin:    General: Skin is warm and dry.     Coloration: Skin is not jaundiced.  Neurological:     Mental Status: He is alert. Mental status is at baseline.  Psychiatric:        Mood and Affect: Mood normal.        Behavior: Behavior normal.        Assessment & Plan:  Assessment and Plan    Evaluation of abnormal liver enzymes Ultrasound showed gallbladder thickening and fluid, no acute infection. Liver suggestive of hepatitis or fatty liver disease. - Ordered hepatitis panel (A, B, C). - Evaluate liver enzyme results and follow up with findings. - No pain with palpation to abdomen  - Consider referral to GI      I personally spent a total of 31 minutes in the care of the patient today including preparing to see the patient, getting/reviewing separately obtained history, performing a medically appropriate exam/evaluation, counseling and educating, placing orders, and documenting clinical information in the EHR.      [1] No Known Allergies [2]   Current Outpatient Medications on File Prior to Visit  Medication Sig Dispense Refill   apixaban  (ELIQUIS ) 5 MG TABS tablet Take 1 tablet (5 mg total) by mouth 2 (two) times daily. 60 tablet 1  carvedilol  (COREG ) 12.5 MG tablet Take 1 tablet (12.5 mg total) by mouth 2 (two) times daily. 60 tablet 0   Continuous Glucose Sensor (DEXCOM G6 SENSOR) MISC Change every 10 days 9 each 3   Continuous Glucose Transmitter (DEXCOM G6 TRANSMITTER) MISC Use every 3 months 1 each 3   furosemide  (LASIX ) 40 MG tablet Take two tablets (80mg ) daily for 2 days then reduce to one tablet daily. (Patient taking differently: Take 40 mg by mouth daily.) 35 tablet 2   insulin  aspart (NOVOLOG  FLEXPEN) 100 UNIT/ML FlexPen Inject 5 Units into the skin 3 (three) times daily with meals. To use as a backup when insulin  pump is not working. 15 mL 2   Insulin  Disposable Pump (OMNIPOD 5 DEXG7G6 PODS GEN 5) MISC CHANGE POD EVERY 3 DAYS 30 each 3   Insulin  Pen Needle 32G X 4 MM MISC Use 4x a day with insulin  pens to administer insulin  100 each 3   latanoprost (XALATAN) 0.005 % ophthalmic solution Place 1 drop into both eyes at bedtime.     lisinopril  (ZESTRIL ) 2.5 MG tablet Take 1 tablet by mouth once daily 90 tablet 0   magnesium  oxide (MAG-OX) 400 MG tablet Take 1 tablet (400 mg total) by mouth daily.     Multiple Vitamins-Minerals (MULTIVITAMIN ADULTS 50+) TABS Take 1 tablet by mouth daily.     NOVOLOG  100 UNIT/ML injection INJECT UP TO 60 UNITS SUBCUTANEOUSLY ONCE DAILY IN  INSULIN   PUMP 50 mL 3   potassium chloride  SA (KLOR-CON  M) 20 MEQ tablet Take 1 tablet (20 mEq total) by mouth daily. Take with lasix .  Repeat labs within 1 week with your PCP or cardiology 30 tablet 0   VITAMIN D  PO Take 1 tablet by mouth every morning.     insulin  glargine (LANTUS  SOLOSTAR) 100 UNIT/ML Solostar Pen Inject 20 Units into the skin daily. To use when insulin  pump is not working, as a backup. (Patient not taking: Reported on 12/14/2024) 15 mL  PRN   No current facility-administered medications on file prior to visit.   "

## 2024-12-14 NOTE — H&P (View-Only) (Signed)
 " Cardiology Office Note:    Date:  12/15/2024   ID:  Christopher [name], DOB 05/16/1946, MRN [id number]  PCP:  Christopher Huxley, NP   [facility] Providers Cardiologist:  Christopher Large, DO     Referring MD: Christopher Huxley, NP   Chief complaint: Hospital follow-up     History of Present Illness:   Christopher [name] is a 78 y.o. male with a hx of  HTN, HLD managed by PCP, mild dementia, CKD 3a, diabetes mellitus T1, prior DKA seen for follow-up of recent hospitalization secondary to pericardial effusion.  The patient was previously seen by Dr. Dann then transitioned to Dr. Large. He had a prior elevated troponin peak of 4,389 while in the hospital 11/2022 in setting of acute renal failure and DKA. Echocardiogram had shown EF 50-55%, moderate LVH, G1DD, mildly reduced RV function, mild MR, dilated IVC. Stress test 12/2022 showed no prior ischemia and no prior myocardial infarction, low risk study.  Elevated troponin felt to be demand ischemia.   ER visit 10/01/2024's 2/2 diabetic foot ulceration, HR observed in the 30s-40s, NSR, frequent PVCs, advised to follow-up with cardiology.  Follow-up with Christopher [name] in November 2025 patient was in newly recognized atrial flutter with frequent PVCs versus aberrant conduction, unaware of his rhythm.  Started on Coreg  and Eliquis .  ZIO monitoring from 11/21-12/04: Dominant rhythm atrial fibrillation, 100% burden, HR 49-199 bpm, average 85 bpm, no high-grade AV block or pauses, total SVE burden 0%, total ventricular ectopic burden 28.4%, 3 auto triggered events noted NSVT, longest episode of NSVT lasting 12 beats, 4 seconds, patient triggered events 0.  Echo 11/08/2024: LVEF 45 to 50%, mildly decreased LV function, no RWMA, moderate concentric LVH, RV SF mildly reduced, moderately elevated PASP, severe dilatation of left and right atria, mild MV regurg, moderate tricuspid regurg, normal AV, mild dilatation of the aortic root measuring 42 mm, suggested right  atrial pressure 15 mmHg.  Presented to the ED 12/01/2024 complaining of moderate shortness of breath with a gradual onset, rhonchi and edema present, creatinine 1.7, AST 59, ALT 72 troponin 71-> 66, BNP 3538, CTA chest PE demonstrating no PE, right perihilar ground glass pulmonary infiltrate and mild.  Bronchovascular interstitial thickening, favoring asymmetric perihilar pulmonary edema in the setting of early cardiogenic failure, moderate right and small left pleural effusions with associated bibasilar atelectasis, mild global cardiomegaly and trace pericardial effusion, small hiatal hernia.  There was question of whether cholecystitis was cause for symptoms following abnormal right upper quadrant ultrasound and elevated liver enzymes, general surgery consulted.  HIDA and MRCP canceled due to patient's inability to participate with exam without sedation and lack of abdominal symptoms.  Patient did become agitated while awaiting admission.  Dementia limited the ability to provide therapeutic care during admission.  Following discussion with wife, they opted to discharge patient rather than keep him in the hospital with restraints and antipsychotics.  Last seen in office 12/06/2024, shortness of breath had improved since leaving the hospital.  Lasix  was increased to 80 mg for 2 days, then to return to 40 mg daily following LE edema.  Seen with A-fib clinic 12/10/2024, remained in rate controled atrial fibrillation with plans for DCCV.   Presented with wife Christopher [name] in clinic, appears stable from cardiovascular standpoint. He denies chest pain, palpitations, dyspnea, orthopnea, n, v,  dark/tarry/bloody stools, hematuria, dizziness, syncope, weight gain. Lasix  improved lower extremity edema, only mild now, has not been keeping legs elevated. Wife states they've not missed any  doses of eliquis . BP reported well controlled at home.   ROS:   Please see the history of present illness.    All other systems reviewed  and are negative.     Past Medical History:  Diagnosis Date   Atrial fibrillation (HCC)    Dementia (HCC)    Diabetes mellitus type II    GERD (gastroesophageal reflux disease)    Glaucoma    Hyperlipidemia    Hypertension     Past Surgical History:  Procedure Laterality Date   COLONOSCOPY     HERNIA REPAIR     PATELLA FRACTURE SURGERY  1993    Current Medications: Active Medications[1]   Allergies:   Patient has no known allergies.   Social History   Socioeconomic History   Marital status: Married    Spouse name: Not on file   Number of children: Not on file   Years of education: Not on file   Highest education level: Not on file  Occupational History   Not on file  Tobacco Use   Smoking status: Never   Smokeless tobacco: Never  Vaping Use   Vaping status: Never Used  Substance and Sexual Activity   Alcohol use: No   Drug use: No   Sexual activity: Not Currently  Other Topics Concern   Not on file  Social History Narrative   He enjoys playing golf    Social Drivers of Health   Tobacco Use: Low Risk (12/15/2024)   Patient History    Smoking Tobacco Use: Never    Smokeless Tobacco Use: Never    Passive Exposure: Not on file  Financial Resource Strain: Low Risk (09/01/2024)   Overall Financial Resource Strain (CARDIA)    Difficulty of Paying Living Expenses: Not hard at all  Food Insecurity: No Food Insecurity (09/01/2024)   Epic    Worried About Programme Researcher, Broadcasting/film/video in the Last Year: Never true    Ran Out of Food in the Last Year: Never true  Transportation Needs: No Transportation Needs (09/01/2024)   Epic    Lack of Transportation (Medical): No    Lack of Transportation (Non-Medical): No  Physical Activity: Inactive (09/01/2024)   Exercise Vital Sign    Days of Exercise per Week: 0 days    Minutes of Exercise per Session: 0 min  Stress: No Stress Concern Present (09/01/2024)   [employer] of Occupational Health - Occupational Stress  Questionnaire    Feeling of Stress: Not at all  Social Connections: Socially Integrated (09/01/2024)   Social Connection and Isolation Panel    Frequency of Communication with Friends and Family: More than three times a week    Frequency of Social Gatherings with Friends and Family: More than three times a week    Attends Religious Services: More than 4 times per year    Active Member of Clubs or Organizations: Yes    Attends Banker Meetings: More than 4 times per year    Marital Status: Married  Depression (PHQ2-9): Low Risk (09/01/2024)   Depression (PHQ2-9)    PHQ-2 Score: 0  Alcohol Screen: Low Risk (09/01/2024)   Alcohol Screen    Last Alcohol Screening Score (AUDIT): 0  Housing: Unknown (09/01/2024)   Epic    Unable to Pay for Housing in the Last Year: No    Number of Times Moved in the Last Year: Not on file    Homeless in the Last Year: No  Utilities: Not At Risk (09/01/2024)  Epic    Threatened with loss of utilities: No  Health Literacy: Adequate Health Literacy (09/01/2024)   B1300 Health Literacy    Frequency of need for help with medical instructions: Never     Family History: The patient's family history includes Alcohol abuse in his father; Diabetes in his brother, brother, and sister. There is no history of Colon cancer, Esophageal cancer, Stomach cancer, or Rectal cancer.  EKGs/Labs/Other Studies Reviewed:    The following studies were reviewed today:  EKG Interpretation Date/Time:  Wednesday December 15 2024 09:36:47 EST Ventricular Rate:  78 PR Interval:    QRS Duration:  94 QT Interval:  406 QTC Calculation: 462 R Axis:   253  Text Interpretation: Atrial flutter with variable A-V block with premature ventricular or aberrantly conducted complexes Right superior axis deviation T wave abnormality, consider inferolateral ischemia Prolonged QT When compared with ECG of 10-Dec-2024 14:08, T wave inversion now evident in Lateral leads No significant  change from prior study Confirmed by Elaine Moloney [phone]) on 12/15/2024 9:43:41 AM    Recent Labs: 11/10/2024: TSH 1.700 12/06/2024: ALT 61; BUN 22; Creatinine, Ser 1.76; Hemoglobin 15.7; Magnesium  1.9; NT-Pro BNP 2,878; Platelets 229; Potassium 4.6; Sodium 142  Recent Lipid Panel    Component Value Date/Time   CHOL 214 (H) 03/20/2023 0832   TRIG 67.0 03/20/2023 0832   HDL 51.20 03/20/2023 0832   CHOLHDL 4 03/20/2023 0832   VLDL 13.4 03/20/2023 0832   LDLCALC 149 (H) 03/20/2023 0832   LDLDIRECT 135.3 06/17/2013 0838     Risk Assessment/Calculations:    CHA2DS2-VASc Score = 5   This indicates a 7.2% annual risk of stroke. The patient's score is based upon: CHF History: 1 HTN History: 1 Diabetes History: 1 Stroke History: 0 Vascular Disease History: 0 Age Score: 2 Gender Score: 0     Physical Exam:    VS:  BP (!) 128/90 (BP Location: Left Arm, Patient Position: Sitting, Cuff Size: Normal)   Pulse 78   Ht 6' 4 (1.93 m)   Wt 210 lb (95.3 kg)   SpO2 97%   BMI 25.56 kg/m        Wt Readings from Last 3 Encounters:  12/15/24 210 lb (95.3 kg)  12/14/24 209 lb (94.8 kg)  12/10/24 207 lb 6.4 oz (94.1 kg)     GEN:  Well nourished, well developed in no acute distress HEENT: Normal NECK: No carotid bruits CARDIAC:  S1-S2 normal, RRR, no murmurs, rubs, gallops RESPIRATORY:  Clear to auscultation without rales, wheezing or rhonchi  MUSCULOSKELETAL:  Mild 1+ pitting edema bilaterally; No deformity  SKIN: Warm and dry NEUROLOGIC:  Alert and oriented x 3 PSYCHIATRIC:  Normal affect       Assessment & Plan Atrial fibrillation, unspecified type (HCC) Atrial flutter, unspecified type (HCC) EKG: Atrial flutter with variable A-V block with premature ventricular or aberrantly conducted complexes Right superior axis deviation T wave abnormality, consider inferolateral ischemia Prolonged QT. No significant change since prior study DCCV planned in 1 week. Denies missing  any doses of eliquis  over last 3 weeks. Notify office for any missed doses or change in symptoms Denies SOB, orthopnea, PND. Not cardiac aware. ED precautions discussed Continue coreg  12.5 mg BID, eliquis  5 mg bid Heart failure with mildly reduced ejection fraction (HFmrEF) (HCC) Echo 11/08/2024: LVEF 45 to 50%, mildly decreased LV function, no RWMA, moderate concentric LVH LE edema improved w/ recent transient increase in lasix  Denies symptoms as above Weights stable Appears euvolemic Continue  coreg  12.5 mg BID, lasix  40 mg daily, lisinopril  2.5 mg daily, potassium 20 meq w/ lasix  Type 1 diabetes mellitus with unspecified complications (HCC) Managed by endocrinology. On long-term insulin  Hypertension associated with diabetes (HCC) Continue to monitor BP at home, keep log and bring back to next appointment.  Continue lisinopril  2.5 mg daily, coreg  as above Aortic aneurysm without rupture, unspecified portion of aorta Mild dilation of the aortic root, measuring 42 mm. Will continue yearly surveillance   Disposition: Proceed w/ DCCV as scheduled. Follow up in 2 months with gen cards. Notify office if anything changes. Proceed to ED if symptoms change/worsen.           Medication Adjustments/Labs and Tests Ordered: Current medicines are reviewed at length with the patient today.  Concerns regarding medicines are outlined above.  Orders Placed This Encounter  Procedures   EKG 12-Lead   Meds ordered this encounter  Medications   apixaban  (ELIQUIS ) 5 MG TABS tablet    Sig: Take 1 tablet (5 mg total) by mouth 2 (two) times daily.    Dispense:  60 tablet    Refill:  5    NEW START   carvedilol  (COREG ) 12.5 MG tablet    Sig: Take 1 tablet (12.5 mg total) by mouth 2 (two) times daily.    Dispense:  180 tablet    Refill:  3    ok to change to 12.5mg  tabs, #60 per MD/maf   furosemide  (LASIX ) 40 MG tablet    Sig: Take 1 tablet (40 mg total) by mouth daily.    Dispense:  90 tablet     Refill:  3   lisinopril  (ZESTRIL ) 2.5 MG tablet    Sig: Take 1 tablet (2.5 mg total) by mouth daily.    Dispense:  90 tablet    Refill:  3   potassium chloride  SA (KLOR-CON  M) 20 MEQ tablet    Sig: Take 1 tablet (20 mEq total) by mouth daily. Take with lasix .    Dispense:  90 tablet    Refill:  3    Patient Instructions  Medication Instructions:  Your physician recommends that you continue on your current medications as directed. Please refer to the Current Medication list given to you today.  *If you need a refill on your cardiac medications before your next appointment, please call your pharmacy*  Lab Work: NONE ordered at this time of appointment   Testing/Procedures: NONE ordered at this time of appointment   Follow-Up: At [facility], you and your health needs are our priority.  As part of our continuing mission to provide you with exceptional heart care, our providers are all part of one team.  This team includes your primary Cardiologist (physician) and Advanced Practice Providers or APPs (Physician Assistants and Nurse Practitioners) who all work together to provide you with the care you need, when you need it.  Your next appointment:   2 month(s)  Provider:   Madonna Large, DO    We recommend signing up for the patient portal called MyChart.  Sign up information is provided on this After Visit Summary.  MyChart is used to connect with patients for Virtual Visits (Telemedicine).  Patients are able to view lab/test results, encounter notes, upcoming appointments, etc.  Non-urgent messages can be sent to your provider as well.   To learn more about what you can do with MyChart, go to [email or web address].   Other Instructions Ok to stop Lasix  1 day before procedure  Signed, Miriam FORBES Shams, NP  12/15/2024 11:02 AM    [facility]     [1]  Current Meds  Medication Sig   Continuous Glucose Sensor (DEXCOM G6 SENSOR) MISC Change  every 10 days   Continuous Glucose Transmitter (DEXCOM G6 TRANSMITTER) MISC Use every 3 months   insulin  aspart (NOVOLOG  FLEXPEN) 100 UNIT/ML FlexPen Inject 5 Units into the skin 3 (three) times daily with meals. To use as a backup when insulin  pump is not working.   Insulin  Disposable Pump (OMNIPOD 5 DEXG7G6 PODS GEN 5) MISC CHANGE POD EVERY 3 DAYS   Insulin  Pen Needle 32G X 4 MM MISC Use 4x a day with insulin  pens to administer insulin    latanoprost (XALATAN) 0.005 % ophthalmic solution Place 1 drop into both eyes at bedtime.   magnesium  oxide (MAG-OX) 400 MG tablet Take 1 tablet (400 mg total) by mouth daily.   Multiple Vitamins-Minerals (MULTIVITAMIN ADULTS 50+) TABS Take 1 tablet by mouth daily.   NOVOLOG  100 UNIT/ML injection INJECT UP TO 60 UNITS SUBCUTANEOUSLY ONCE DAILY IN  INSULIN   PUMP   VITAMIN D  PO Take 1 tablet by mouth every morning.   [DISCONTINUED] apixaban  (ELIQUIS ) 5 MG TABS tablet Take 1 tablet (5 mg total) by mouth 2 (two) times daily.   [DISCONTINUED] carvedilol  (COREG ) 12.5 MG tablet Take 1 tablet (12.5 mg total) by mouth 2 (two) times daily.   [DISCONTINUED] furosemide  (LASIX ) 40 MG tablet Take two tablets (80mg ) daily for 2 days then reduce to one tablet daily. (Patient taking differently: Take 40 mg by mouth daily.)   [DISCONTINUED] lisinopril  (ZESTRIL ) 2.5 MG tablet Take 1 tablet by mouth once daily   [DISCONTINUED] potassium chloride  SA (KLOR-CON  M) 20 MEQ tablet Take 1 tablet (20 mEq total) by mouth daily. Take with lasix .  Repeat labs within 1 week with your PCP or cardiology   "

## 2024-12-15 ENCOUNTER — Encounter: Payer: Self-pay | Admitting: Nurse Practitioner

## 2024-12-15 ENCOUNTER — Other Ambulatory Visit (HOSPITAL_COMMUNITY): Payer: Self-pay

## 2024-12-15 ENCOUNTER — Ambulatory Visit: Admitting: Emergency Medicine

## 2024-12-15 VITALS — BP 128/90 | HR 78 | Ht 76.0 in | Wt 210.0 lb

## 2024-12-15 DIAGNOSIS — E108 Type 1 diabetes mellitus with unspecified complications: Secondary | ICD-10-CM

## 2024-12-15 DIAGNOSIS — I4891 Unspecified atrial fibrillation: Secondary | ICD-10-CM | POA: Diagnosis not present

## 2024-12-15 DIAGNOSIS — I4892 Unspecified atrial flutter: Secondary | ICD-10-CM | POA: Diagnosis not present

## 2024-12-15 DIAGNOSIS — I152 Hypertension secondary to endocrine disorders: Secondary | ICD-10-CM

## 2024-12-15 DIAGNOSIS — E1159 Type 2 diabetes mellitus with other circulatory complications: Secondary | ICD-10-CM

## 2024-12-15 DIAGNOSIS — I719 Aortic aneurysm of unspecified site, without rupture: Secondary | ICD-10-CM | POA: Diagnosis not present

## 2024-12-15 DIAGNOSIS — I5020 Unspecified systolic (congestive) heart failure: Secondary | ICD-10-CM

## 2024-12-15 LAB — HEPATITIS PANEL, ACUTE
Hep A IgM: NONREACTIVE
Hep B C IgM: NONREACTIVE
Hepatitis B Surface Ag: NONREACTIVE
Hepatitis C Ab: NONREACTIVE

## 2024-12-15 LAB — LUMIPULSE® PTAU-217/BETA AMYLOID 42 RATIO, PLASMA

## 2024-12-15 LAB — TSH: TSH: 1.35 u[IU]/mL (ref 0.450–4.500)

## 2024-12-15 LAB — COMPREHENSIVE METABOLIC PANEL WITH GFR
ALT: 39 U/L (ref 3–53)
AST: 34 U/L (ref 5–37)
Albumin: 3.8 g/dL (ref 3.5–5.2)
Alkaline Phosphatase: 97 U/L (ref 39–117)
BUN: 23 mg/dL (ref 6–23)
CO2: 30 meq/L (ref 19–32)
Calcium: 9.1 mg/dL (ref 8.4–10.5)
Chloride: 105 meq/L (ref 96–112)
Creatinine, Ser: 1.77 mg/dL — ABNORMAL HIGH (ref 0.40–1.50)
GFR: 36.6 mL/min — ABNORMAL LOW
Glucose, Bld: 124 mg/dL — ABNORMAL HIGH (ref 70–99)
Potassium: 4.4 meq/L (ref 3.5–5.1)
Sodium: 142 meq/L (ref 135–145)
Total Bilirubin: 0.8 mg/dL (ref 0.2–1.2)
Total Protein: 6.2 g/dL (ref 6.0–8.3)

## 2024-12-15 LAB — HEPATIC FUNCTION PANEL
ALT: 39 U/L (ref 3–53)
AST: 34 U/L (ref 5–37)
Albumin: 3.8 g/dL (ref 3.5–5.2)
Alkaline Phosphatase: 97 U/L (ref 39–117)
Bilirubin, Direct: 0.2 mg/dL (ref 0.1–0.3)
Total Bilirubin: 0.8 mg/dL (ref 0.2–1.2)
Total Protein: 6.2 g/dL (ref 6.0–8.3)

## 2024-12-15 LAB — VITAMIN B12: Vitamin B-12: 640 pg/mL (ref 232–1245)

## 2024-12-15 MED ORDER — LISINOPRIL 2.5 MG PO TABS
2.5000 mg | ORAL_TABLET | Freq: Every day | ORAL | 3 refills | Status: AC
  Filled 2024-12-15: qty 90, 90d supply, fill #0

## 2024-12-15 MED ORDER — CARVEDILOL 12.5 MG PO TABS
12.5000 mg | ORAL_TABLET | Freq: Two times a day (BID) | ORAL | 3 refills | Status: AC
  Filled 2024-12-15: qty 180, 90d supply, fill #0

## 2024-12-15 MED ORDER — POTASSIUM CHLORIDE CRYS ER 20 MEQ PO TBCR
20.0000 meq | EXTENDED_RELEASE_TABLET | Freq: Every day | ORAL | 3 refills | Status: AC
  Filled 2024-12-15: qty 90, 90d supply, fill #0

## 2024-12-15 MED ORDER — APIXABAN 5 MG PO TABS
5.0000 mg | ORAL_TABLET | Freq: Two times a day (BID) | ORAL | 5 refills | Status: AC
  Filled 2024-12-15: qty 60, 30d supply, fill #0

## 2024-12-15 MED ORDER — FUROSEMIDE 40 MG PO TABS
40.0000 mg | ORAL_TABLET | Freq: Every day | ORAL | 3 refills | Status: AC
  Filled 2024-12-15: qty 90, 90d supply, fill #0

## 2024-12-15 NOTE — Patient Instructions (Signed)
 Medication Instructions:  Your physician recommends that you continue on your current medications as directed. Please refer to the Current Medication list given to you today.  *If you need a refill on your cardiac medications before your next appointment, please call your pharmacy*  Lab Work: NONE ordered at this time of appointment   Testing/Procedures: NONE ordered at this time of appointment   Follow-Up: At [facility], you and your health needs are our priority.  As part of our continuing mission to provide you with exceptional heart care, our providers are all part of one team.  This team includes your primary Cardiologist (physician) and Advanced Practice Providers or APPs (Physician Assistants and Nurse Practitioners) who all work together to provide you with the care you need, when you need it.  Your next appointment:   2 month(s)  Provider:   Madonna Large, DO    We recommend signing up for the patient portal called MyChart.  Sign up information is provided on this After Visit Summary.  MyChart is used to connect with patients for Virtual Visits (Telemedicine).  Patients are able to view lab/test results, encounter notes, upcoming appointments, etc.  Non-urgent messages can be sent to your provider as well.   To learn more about what you can do with MyChart, go to [email or web address].   Other Instructions Ok to stop Lasix  1 day before procedure

## 2024-12-17 ENCOUNTER — Ambulatory Visit: Payer: Self-pay | Admitting: Adult Health

## 2024-12-17 NOTE — Progress Notes (Signed)
 Pt called for pre procedure instructions.  Left VM on wife's VM, pt has history of dementia Arrival time 0700 NPO after midnight explained Instructed to take am meds with sip of water  and confirmed blood thinner consistency Instructed pt need for ride home tomorrow and have responsible adult with them for 24 hrs post procedure.

## 2024-12-20 ENCOUNTER — Encounter (HOSPITAL_COMMUNITY)
Admission: RE | Disposition: A | Discharge status: Home / Self Care | Payer: Self-pay | Source: Home / Self Care | Attending: Internal Medicine

## 2024-12-20 ENCOUNTER — Other Ambulatory Visit: Payer: Self-pay

## 2024-12-20 ENCOUNTER — Ambulatory Visit (HOSPITAL_COMMUNITY)
Admission: RE | Admit: 2024-12-20 | Discharge: 2024-12-20 | Disposition: A | Discharge status: Home / Self Care | Attending: Internal Medicine | Admitting: Internal Medicine

## 2024-12-20 ENCOUNTER — Encounter (HOSPITAL_COMMUNITY): Payer: Self-pay | Admitting: Internal Medicine

## 2024-12-20 ENCOUNTER — Ambulatory Visit (HOSPITAL_COMMUNITY)

## 2024-12-20 DIAGNOSIS — F03A3 Unspecified dementia, mild, with mood disturbance: Secondary | ICD-10-CM | POA: Insufficient documentation

## 2024-12-20 DIAGNOSIS — I509 Heart failure, unspecified: Secondary | ICD-10-CM | POA: Diagnosis not present

## 2024-12-20 DIAGNOSIS — I719 Aortic aneurysm of unspecified site, without rupture: Secondary | ICD-10-CM | POA: Diagnosis not present

## 2024-12-20 DIAGNOSIS — E785 Hyperlipidemia, unspecified: Secondary | ICD-10-CM | POA: Insufficient documentation

## 2024-12-20 DIAGNOSIS — E1022 Type 1 diabetes mellitus with diabetic chronic kidney disease: Secondary | ICD-10-CM | POA: Diagnosis not present

## 2024-12-20 DIAGNOSIS — E1122 Type 2 diabetes mellitus with diabetic chronic kidney disease: Secondary | ICD-10-CM

## 2024-12-20 DIAGNOSIS — I4892 Unspecified atrial flutter: Secondary | ICD-10-CM | POA: Insufficient documentation

## 2024-12-20 DIAGNOSIS — I5020 Unspecified systolic (congestive) heart failure: Secondary | ICD-10-CM | POA: Insufficient documentation

## 2024-12-20 DIAGNOSIS — Z794 Long term (current) use of insulin: Secondary | ICD-10-CM | POA: Insufficient documentation

## 2024-12-20 DIAGNOSIS — I4891 Unspecified atrial fibrillation: Secondary | ICD-10-CM

## 2024-12-20 DIAGNOSIS — F32A Depression, unspecified: Secondary | ICD-10-CM | POA: Diagnosis not present

## 2024-12-20 DIAGNOSIS — N1831 Chronic kidney disease, stage 3a: Secondary | ICD-10-CM | POA: Diagnosis not present

## 2024-12-20 DIAGNOSIS — I129 Hypertensive chronic kidney disease with stage 1 through stage 4 chronic kidney disease, or unspecified chronic kidney disease: Secondary | ICD-10-CM

## 2024-12-20 DIAGNOSIS — Z7901 Long term (current) use of anticoagulants: Secondary | ICD-10-CM | POA: Insufficient documentation

## 2024-12-20 DIAGNOSIS — I130 Hypertensive heart and chronic kidney disease with heart failure and stage 1 through stage 4 chronic kidney disease, or unspecified chronic kidney disease: Secondary | ICD-10-CM | POA: Insufficient documentation

## 2024-12-20 DIAGNOSIS — Z79899 Other long term (current) drug therapy: Secondary | ICD-10-CM | POA: Diagnosis not present

## 2024-12-20 DIAGNOSIS — I152 Hypertension secondary to endocrine disorders: Secondary | ICD-10-CM | POA: Diagnosis not present

## 2024-12-20 HISTORY — PX: CARDIOVERSION: EP1203

## 2024-12-20 MED ORDER — PROPOFOL 10 MG/ML IV BOLUS
INTRAVENOUS | Status: DC | PRN
  Administered 2024-12-20: 50 mg via INTRAVENOUS

## 2024-12-20 NOTE — Interval H&P Note (Signed)
 History and Physical Interval Note:  12/20/2024 8:28 AM  Christopher [name]  has presented today for surgery, with the diagnosis of AFIB.  The various methods of treatment have been discussed with the patient and family. After consideration of risks, benefits and other options for treatment, the patient has consented to  Procedures: CARDIOVERSION (N/A) as a surgical intervention.  The patient's history has been reviewed, patient examined, no change in status, stable for surgery.  I have reviewed the patient's chart and labs.  Questions were answered to the patient's wife(hPOA).  Re-entry from prior H&P due to IT error.   Christopher [name] A Harrie Cazarez

## 2024-12-20 NOTE — Transfer of Care (Signed)
 Immediate Anesthesia Transfer of Care Note  Patient: Christopher [name]  Procedure(s) Performed: CARDIOVERSION  Patient Location: Cath Lab  Anesthesia Type:General  Level of Consciousness: awake, alert , and oriented  Airway & Oxygen Therapy: Patient Spontanous Breathing and Patient connected to nasal cannula oxygen  Post-op Assessment: Report given to RN and Post -op Vital signs reviewed and stable  Post vital signs: Reviewed and stable  Last Vitals:  Vitals Value Taken Time  BP    Temp    Pulse    Resp    SpO2      Last Pain:  Vitals:   12/20/24 0740  TempSrc:   PainSc: 0-No pain         Complications: There were no known notable events for this encounter.

## 2024-12-20 NOTE — Anesthesia Postprocedure Evaluation (Signed)
"   Anesthesia Post Note  Patient: Christopher [name]  Procedure(s) Performed: CARDIOVERSION     Patient location during evaluation: Cath Lab Anesthesia Type: MAC Level of consciousness: awake Pain management: pain level controlled Vital Signs Assessment: post-procedure vital signs reviewed and stable Respiratory status: spontaneous breathing, nonlabored ventilation and respiratory function stable Cardiovascular status: blood pressure returned to baseline and stable Postop Assessment: no apparent nausea or vomiting Anesthetic complications: no   There were no known notable events for this encounter.  Last Vitals:  Vitals:   12/20/24 0915 12/20/24 0925  BP: 120/78 118/76  Pulse: (!) 56 (!) 58  Resp:    Temp:    SpO2: 99% 98%    Last Pain:  Vitals:   12/20/24 0740  TempSrc:   PainSc: 0-No pain                 Loel Betancur P Evelyn Moch      "

## 2024-12-20 NOTE — Anesthesia Preprocedure Evaluation (Addendum)
"                                    Anesthesia Evaluation  Patient identified by MRN, date of birth, ID band Patient awake    Reviewed: Allergy & Precautions, NPO status , Patient's Chart, lab work & pertinent test results  Airway Mallampati: III  TM Distance: >3 FB Neck ROM: Full    Dental no notable dental hx.    Pulmonary    Pulmonary exam normal        Cardiovascular hypertension, Pt. on home beta blockers and Pt. on medications +CHF  Normal cardiovascular exam+ dysrhythmias Atrial Fibrillation   ECHO: 1. Left ventricular ejection fraction, by estimation, is 45 to 50%. The  left ventricle has mildly decreased function. The left ventricle has no  regional wall motion abnormalities. There is moderate concentric left  ventricular hypertrophy. Left  ventricular diastolic function could not be evaluated.   2. Right ventricular systolic function is mildly reduced. The right  ventricular size is normal. There is moderately elevated pulmonary artery  systolic pressure.   3. Left atrial size was severely dilated.   4. Right atrial size was severely dilated.   5. The mitral valve is normal in structure. Mild mitral valve  regurgitation. No evidence of mitral stenosis.   6. Tricuspid valve regurgitation is mild to moderate.   7. The aortic valve is tricuspid. Aortic valve regurgitation is not  visualized. No aortic stenosis is present.   8. Aortic dilatation noted. There is mild dilatation of the aortic root,  measuring 42 mm.   9. The inferior vena cava is dilated in size with <50% respiratory  variability, suggesting right atrial pressure of 15 mmHg.     Neuro/Psych  PSYCHIATRIC DISORDERS     Dementia TIA   GI/Hepatic   Endo/Other  diabetes, Insulin  Dependent    Renal/GU Renal InsufficiencyRenal disease     Musculoskeletal   Abdominal   Peds  Hematology   Anesthesia Other Findings A-FIB  Reproductive/Obstetrics                               Anesthesia Physical Anesthesia Plan  ASA: 4  Anesthesia Plan: MAC   Post-op Pain Management:    Induction:   PONV Risk Score and Plan: 1 and Propofol  infusion and Treatment may vary due to age or medical condition  Airway Management Planned: Nasal Cannula  Additional Equipment:   Intra-op Plan:   Post-operative Plan:   Informed Consent: I have reviewed the patients History and Physical, chart, labs and discussed the procedure including the risks, benefits and alternatives for the proposed anesthesia with the patient or authorized representative who has indicated his/her understanding and acceptance.     Consent reviewed with POA  Plan Discussed with: CRNA  Anesthesia Plan Comments:          Anesthesia Quick Evaluation  "

## 2024-12-20 NOTE — CV Procedure (Signed)
" ° ° °  Electrical Cardioversion Procedure Note Christopher [name] [id number] 1946-11-03  Procedure: Electrical Cardioversion Indications:  Atrial Fibrillation  Time Out: Verified patient identification, verified procedure,medications/allergies/relevent history reviewed, required imaging and test results available.  Performed  Procedure Details  The patient was NPO after midnight. Anesthesia was administered at the beside  by Dr.Elhender or team.  Cardioversion was done with synchronized biphasic defibrillation with 200 Joules.  The patient converted to sinus rhythm with PVCs in the pattern of bigeminy.  The patient tolerated the procedure well.  IMPRESSION:  Successful cardioversion of atrial fibrillation.   Stanly Leavens, MD FASE [facility] Cardiologist [facility]  [address] [place], [address] [place], [place] [address] [phone]  8:27 AM     "

## 2024-12-24 ENCOUNTER — Other Ambulatory Visit

## 2024-12-28 ENCOUNTER — Other Ambulatory Visit (HOSPITAL_COMMUNITY): Payer: Self-pay

## 2024-12-29 ENCOUNTER — Other Ambulatory Visit (HOSPITAL_COMMUNITY): Payer: Self-pay

## 2025-01-04 ENCOUNTER — Ambulatory Visit (HOSPITAL_COMMUNITY)
Admission: RE | Admit: 2025-01-04 | Discharge: 2025-01-04 | Disposition: A | Discharge status: Home / Self Care | Source: Ambulatory Visit | Attending: Physician Assistant | Admitting: Physician Assistant

## 2025-01-04 VITALS — BP 112/80 | HR 82 | Ht 76.0 in | Wt 203.6 lb

## 2025-01-04 DIAGNOSIS — D6869 Other thrombophilia: Secondary | ICD-10-CM | POA: Diagnosis not present

## 2025-01-04 DIAGNOSIS — I483 Typical atrial flutter: Secondary | ICD-10-CM | POA: Diagnosis not present

## 2025-01-04 DIAGNOSIS — I4819 Other persistent atrial fibrillation: Secondary | ICD-10-CM

## 2025-01-04 DIAGNOSIS — I4891 Unspecified atrial fibrillation: Secondary | ICD-10-CM

## 2025-01-04 NOTE — Progress Notes (Signed)
 "   Primary Care Physician: Merna Huxley, NP Primary Cardiologist: Madonna Large, DO Electrophysiologist: None  Referring Physician: Raphael Bring PA   Christopher [name] is a 79 y.o. male with a history of CHF, HTN, HLD, TIA, venous insufficiency, dementia, DM type 1, atrial fibrillation who presents for follow up in the [facility]. He was seen in office 11/01/2024 with reports of high blood pressure and appeared to be in atrial flutter. Echo 11/04/2024 LVEF 45 to 50%, LA and RA severely dilated, mild MR. Heart monitor results 11/25/2024 show atrial fibrillation 100% burden with average heart rate of 85 bpm. He presented to the ED 12/01/2024 with shortness of breath and BLE edema. He was admitted for CHF exacerbation. Patient is on Eliquis  for stroke prevention.    Patient returns for follow up for atrial fibrillation. His wife helps to provide the history today. Patient is s/p DCCV on 12/20/24. Unfortunately, he is back in atrial flutter today. He is unaware of his arrhythmia and feels well. No bleeding issues on anticoagulation.   Today, he  denies symptoms of palpitations, chest pain, shortness of breath, orthopnea, PND, lower extremity edema, dizziness, presyncope, syncope, snoring, daytime somnolence, bleeding, or neurologic sequela. The patient is tolerating medications without difficulties and is otherwise without complaint today.    Atrial Fibrillation Risk Factors:  he does not have symptoms or diagnosis of sleep apnea. he does not have a history of rheumatic fever.   Atrial Fibrillation Management history:  Previous antiarrhythmic drugs: none Previous cardioversions: 12/20/24 Previous ablations: none Anticoagulation history: Eliquis   ROS- All systems are reviewed and negative except as per the HPI above.  Past Medical History:  Diagnosis Date   Atrial fibrillation (HCC)    Dementia (HCC)    Diabetes mellitus type II    GERD (gastroesophageal reflux  disease)    Glaucoma    Hyperlipidemia    Hypertension     Current Outpatient Medications  Medication Sig Dispense Refill   apixaban  (ELIQUIS ) 5 MG TABS tablet Take 1 tablet (5 mg total) by mouth 2 (two) times daily. 60 tablet 5   carvedilol  (COREG ) 12.5 MG tablet Take 1 tablet (12.5 mg total) by mouth 2 (two) times daily. 180 tablet 3   Continuous Glucose Sensor (DEXCOM G6 SENSOR) MISC Change every 10 days 9 each 3   Continuous Glucose Transmitter (DEXCOM G6 TRANSMITTER) MISC Use every 3 months 1 each 3   diphenhydrAMINE-Zinc Acetate (BENADRYL EX) Apply 1 Application topically daily as needed (itching).     furosemide  (LASIX ) 40 MG tablet Take 1 tablet (40 mg total) by mouth daily. 90 tablet 3   insulin  aspart (NOVOLOG  FLEXPEN) 100 UNIT/ML FlexPen Inject 5 Units into the skin 3 (three) times daily with meals. To use as a backup when insulin  pump is not working. (Patient taking differently: Inject 5 Units into the skin as needed. To use as a backup when insulin  pump is not working.) 15 mL 2   Insulin  Disposable Pump (OMNIPOD 5 [id number] PODS GEN 5) MISC CHANGE POD EVERY 3 DAYS 30 each 3   insulin  glargine (LANTUS  SOLOSTAR) 100 UNIT/ML Solostar Pen Inject 20 Units into the skin daily. To use when insulin  pump is not working, as a backup. (Patient taking differently: Inject 20 Units into the skin as needed. To use when insulin  pump is not working, as a backup.) 15 mL PRN   Insulin  Pen Needle 32G X 4 MM MISC Use 4x a day with insulin  pens to  administer insulin  100 each 3   latanoprost (XALATAN) 0.005 % ophthalmic solution Place 1 drop into both eyes at bedtime.     lisinopril  (ZESTRIL ) 2.5 MG tablet Take 1 tablet (2.5 mg total) by mouth daily. 90 tablet 3   Multiple Vitamins-Minerals (MULTIVITAMIN ADULTS 50+) TABS Take 1 tablet by mouth daily.     NOVOLOG  100 UNIT/ML injection INJECT UP TO 60 UNITS SUBCUTANEOUSLY ONCE DAILY IN  INSULIN   PUMP 50 mL 3   OVER THE COUNTER MEDICATION Take 1 tablet by  mouth daily. Magnesium  oxide citrate and maleate + Vitamin B6     potassium chloride  SA (KLOR-CON  M) 20 MEQ tablet Take 1 tablet (20 mEq total) by mouth daily. Take with lasix . 90 tablet 3   VITAMIN D  PO Take 25 mcg by mouth every morning.     No current facility-administered medications for this encounter.    Physical Exam: BP 112/80   Pulse 82   Ht 6' 4 (1.93 m)   Wt 92.4 kg   BMI 24.78 kg/m   GEN: Well nourished, well developed in no acute distress CARDIAC: Irregularly irregular rate and rhythm, no murmurs, rubs, gallops RESPIRATORY:  Clear to auscultation without rales, wheezing or rhonchi  ABDOMEN: Soft, non-tender, non-distended EXTREMITIES:  No edema; No deformity    Wt Readings from Last 3 Encounters:  01/04/25 92.4 kg  12/20/24 90.7 kg  12/15/24 95.3 kg     EKG Interpretation Date/Time:  Tuesday January 04 2025 14:12:36 EST Ventricular Rate:  82 PR Interval:    QRS Duration:  78 QT Interval:  384 QTC Calculation: 448 R Axis:   259  Text Interpretation: Atrial flutter with variable A-V block with premature ventricular or aberrantly conducted complexes Abnormal ECG When compared with ECG of 20-Dec-2024 08:30, Atrial flutter has replaced Sinus rhythm Confirmed by Heman Que ([id number]) on 01/04/2025 2:22:45 PM    Echo 11/08/24 demonstrated   1. Left ventricular ejection fraction, by estimation, is 45 to 50%. The  left ventricle has mildly decreased function. The left ventricle has no  regional wall motion abnormalities. There is moderate concentric left  ventricular hypertrophy. Left ventricular diastolic function could not be evaluated.   2. Right ventricular systolic function is mildly reduced. The right  ventricular size is normal. There is moderately elevated pulmonary artery  systolic pressure.   3. Left atrial size was severely dilated.   4. Right atrial size was severely dilated.   5. The mitral valve is normal in structure. Mild mitral valve   regurgitation. No evidence of mitral stenosis.   6. Tricuspid valve regurgitation is mild to moderate.   7. The aortic valve is tricuspid. Aortic valve regurgitation is not  visualized. No aortic stenosis is present.   8. Aortic dilatation noted. There is mild dilatation of the aortic root,  measuring 42 mm.   9. The inferior vena cava is dilated in size with <50% respiratory  variability, suggesting right atrial pressure of 15 mmHg.    CHA2DS2-VASc Score = 5  The patient's score is based upon: CHF History: 1 HTN History: 1 Diabetes History: 1 Stroke History: 0 Vascular Disease History: 0 Age Score: 2 Gender Score: 0       ASSESSMENT AND PLAN: Persistent Atrial Fibrillation/atrial flutter (ICD10:  I48.19) The patient's CHA2DS2-VASc score is 5, indicating a 7.2% annual risk of stroke.   S/p DCCV 12/20/24 with quick return of atrial flutter.  We discussed rate vs rhythm control today. Specifically amiodarone and ablation. Would avoid  class IC and Multaq with mildly reduced EF. QT in SR probably too long for dofetilide. He and his wife would like to pursue rate control for now. If he develops symptoms, they would favor amiodarone over ablation.  Continue carvedilol  12.5 mg BID Continue Eliquis  5 mg BID  Secondary Hypercoagulable State (ICD10:  D68.69) The patient is at significant risk for stroke/thromboembolism based upon his CHA2DS2-VASc Score of 5.  Continue Apixaban  (Eliquis ). No bleeding issues.   HFmrEF EF 45-50% GDMT per primary cardiology team Fluid status appears stable today  HTN Stable on current regimen   Follow up with Dr Michele as scheduled.    [facility] [address] [place], [place] [address] [phone] "

## 2025-01-14 ENCOUNTER — Ambulatory Visit (HOSPITAL_COMMUNITY)
Admission: RE | Admit: 2025-01-14 | Discharge: 2025-01-14 | Disposition: A | Source: Ambulatory Visit | Attending: Internal Medicine

## 2025-01-14 ENCOUNTER — Ambulatory Visit
Admission: RE | Admit: 2025-01-14 | Discharge: 2025-01-14 | Disposition: A | Discharge status: Home / Self Care | Attending: Internal Medicine

## 2025-01-14 ENCOUNTER — Telehealth: Payer: Self-pay | Admitting: Internal Medicine

## 2025-01-14 ENCOUNTER — Ambulatory Visit: Payer: Self-pay | Admitting: Internal Medicine

## 2025-01-14 VITALS — BP 118/76 | HR 65 | Temp 97.8°F | Resp 16 | Wt 202.0 lb

## 2025-01-14 DIAGNOSIS — M7989 Other specified soft tissue disorders: Secondary | ICD-10-CM

## 2025-01-14 DIAGNOSIS — W009XXA Unspecified fall due to ice and snow, initial encounter: Secondary | ICD-10-CM

## 2025-01-14 DIAGNOSIS — M79641 Pain in right hand: Secondary | ICD-10-CM | POA: Diagnosis not present

## 2025-01-14 DIAGNOSIS — S60511A Abrasion of right hand, initial encounter: Secondary | ICD-10-CM | POA: Diagnosis not present

## 2025-01-14 MED ORDER — ACETAMINOPHEN 325 MG PO TABS
975.0000 mg | ORAL_TABLET | Freq: Once | ORAL | Status: AC
  Administered 2025-01-14: 975 mg via ORAL

## 2025-01-14 NOTE — ED Notes (Signed)
 Patient from [facility]. EMERSON Silk, NP called and said the patient needed an ace wrap to his right hand.  4 inch ace wrap applied to the right hand.

## 2025-01-14 NOTE — Telephone Encounter (Signed)
 Telephone encounter created to place order for ACE wrap to be applied to the right hand. X-ray unremarkable for signs of acute bony abnormality, shows degenerative changes. No need for splint. ACE wrap to improve swelling, elevation/ice as well. Follow-up with PCP/hand specialist as needed.

## 2025-01-14 NOTE — Discharge Instructions (Addendum)
 Please go over to [facility] health urgent care at [address] [place], [place] to have x-ray of the right hand performed.   They are expecting you.  If the hand is broken, they will apply splint over there.   Take tylenol  1,000mg  every 6 hours as needed for pain.  Ice and elevate the hand to reduce swelling. If you lose sensation to the right hand finger tips, if the color or temperature of the right hand changes (becomes purple/blue or really hot/really cold), or if the swelling worsens and does not improve in the next 24 hours, please go to the ER.

## 2025-01-14 NOTE — ED Triage Notes (Signed)
 Pt slipped and fell on the ice yesterday and injured his right hand. Pt denies hitting his head.

## 2025-01-18 ENCOUNTER — Ambulatory Visit (INDEPENDENT_AMBULATORY_CARE_PROVIDER_SITE_OTHER): Admitting: Podiatry

## 2025-01-18 ENCOUNTER — Encounter: Payer: Self-pay | Admitting: Podiatry

## 2025-01-18 DIAGNOSIS — M79675 Pain in left toe(s): Secondary | ICD-10-CM

## 2025-01-18 DIAGNOSIS — M79674 Pain in right toe(s): Secondary | ICD-10-CM

## 2025-01-18 DIAGNOSIS — B351 Tinea unguium: Secondary | ICD-10-CM | POA: Diagnosis not present

## 2025-01-18 DIAGNOSIS — E1140 Type 2 diabetes mellitus with diabetic neuropathy, unspecified: Secondary | ICD-10-CM

## 2025-01-18 DIAGNOSIS — I872 Venous insufficiency (chronic) (peripheral): Secondary | ICD-10-CM | POA: Diagnosis not present

## 2025-01-18 DIAGNOSIS — E1149 Type 2 diabetes mellitus with other diabetic neurological complication: Secondary | ICD-10-CM | POA: Diagnosis not present

## 2025-01-21 NOTE — Progress Notes (Signed)
 Christopher [name]                                          MRN: [id number]   01/21/2025   The [facility] Quality Team Specialist reviewed this patient medical record for the purposes of chart review for care gap closure. The following were reviewed: chart review for care gap closure-controlling blood pressure.    [facility] Quality Team

## 2025-02-02 ENCOUNTER — Ambulatory Visit: Admitting: Neurology

## 2025-02-09 ENCOUNTER — Ambulatory Visit: Admitting: Cardiology

## 2025-03-21 ENCOUNTER — Ambulatory Visit: Admitting: Endocrinology

## 2025-03-28 ENCOUNTER — Ambulatory Visit: Admitting: Endocrinology

## 2025-04-18 ENCOUNTER — Ambulatory Visit: Admitting: Podiatry

## 2025-09-07 ENCOUNTER — Ambulatory Visit

## 2025-10-31 ENCOUNTER — Ambulatory Visit: Admitting: Neurology
